# Patient Record
Sex: Female | Born: 1935
Health system: Southern US, Community
[De-identification: ages and names within clinical notes are randomized; demographics above are authoritative.]

## PROBLEM LIST (undated history)

## (undated) DIAGNOSIS — R41841 Cognitive communication deficit: Secondary | ICD-10-CM

## (undated) DIAGNOSIS — C9110 Chronic lymphocytic leukemia of B-cell type not having achieved remission: Secondary | ICD-10-CM

## (undated) DIAGNOSIS — G43909 Migraine, unspecified, not intractable, without status migrainosus: Secondary | ICD-10-CM

## (undated) DIAGNOSIS — C801 Malignant (primary) neoplasm, unspecified: Secondary | ICD-10-CM

## (undated) DIAGNOSIS — E039 Hypothyroidism, unspecified: Secondary | ICD-10-CM

## (undated) DIAGNOSIS — C884 Extranodal marginal zone b-cell lymphoma of mucosa-associated lymphoid tissue (malt-lymphoma) not having achieved remission: Secondary | ICD-10-CM

## (undated) DIAGNOSIS — I609 Nontraumatic subarachnoid hemorrhage, unspecified: Secondary | ICD-10-CM

## (undated) DIAGNOSIS — M792 Neuralgia and neuritis, unspecified: Secondary | ICD-10-CM

## (undated) DIAGNOSIS — M25511 Pain in right shoulder: Secondary | ICD-10-CM

## (undated) DIAGNOSIS — H04129 Dry eye syndrome of unspecified lacrimal gland: Secondary | ICD-10-CM

## (undated) DIAGNOSIS — R000 Tachycardia, unspecified: Secondary | ICD-10-CM

## (undated) DIAGNOSIS — G500 Trigeminal neuralgia: Secondary | ICD-10-CM

## (undated) DIAGNOSIS — M6281 Muscle weakness (generalized): Secondary | ICD-10-CM

## (undated) DIAGNOSIS — M549 Dorsalgia, unspecified: Secondary | ICD-10-CM

## (undated) DIAGNOSIS — E079 Disorder of thyroid, unspecified: Secondary | ICD-10-CM

## (undated) DIAGNOSIS — Z9181 History of falling: Secondary | ICD-10-CM

## (undated) DIAGNOSIS — M810 Age-related osteoporosis without current pathological fracture: Secondary | ICD-10-CM

## (undated) DIAGNOSIS — R2681 Unsteadiness on feet: Secondary | ICD-10-CM

## (undated) DIAGNOSIS — R293 Abnormal posture: Secondary | ICD-10-CM

## (undated) DIAGNOSIS — Z8572 Personal history of non-Hodgkin lymphomas: Principal | ICD-10-CM

## (undated) DIAGNOSIS — I890 Lymphedema, not elsewhere classified: Secondary | ICD-10-CM

## (undated) HISTORY — DX: Lymphedema, not elsewhere classified: I89.0

## (undated) HISTORY — DX: Neuralgia and neuritis, unspecified: M79.2

## (undated) HISTORY — DX: History of falling: Z91.81

## (undated) HISTORY — DX: Malignant (primary) neoplasm, unspecified: C80.1

## (undated) HISTORY — DX: Dry eye syndrome of unspecified lacrimal gland: H04.129

## (undated) HISTORY — DX: Abnormal posture: R29.3

## (undated) HISTORY — PX: TONSILLECTOMY AND ADENOIDECTOMY: SHX28

## (undated) HISTORY — DX: Tachycardia, unspecified: R00.0

## (undated) HISTORY — PX: SHOULDER SURGERY: SHX246

## (undated) HISTORY — DX: Muscle weakness (generalized): M62.81

## (undated) HISTORY — DX: Extranodal marginal zone b-cell lymphoma of mucosa-associated lymphoid tissue (malt-lymphoma) not having achieved remission: C88.40

## (undated) HISTORY — DX: Cognitive communication deficit: R41.841

## (undated) HISTORY — DX: Age-related osteoporosis without current pathological fracture: M81.0

## (undated) HISTORY — DX: Disorder of thyroid, unspecified: E07.9

## (undated) HISTORY — DX: Hypothyroidism, unspecified: E03.9

## (undated) HISTORY — DX: Dorsalgia, unspecified: M54.9

## (undated) HISTORY — PX: WISDOM TOOTH EXTRACTION: SHX21

## (undated) HISTORY — DX: Trigeminal neuralgia: G50.0

## (undated) HISTORY — DX: 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): C88.4

## (undated) HISTORY — DX: Nontraumatic subarachnoid hemorrhage, unspecified: I60.9

## (undated) HISTORY — DX: Personal history of non-Hodgkin lymphomas: Z85.72

## (undated) HISTORY — DX: Pain in right shoulder: M25.511

## (undated) HISTORY — DX: Chronic lymphocytic leukemia of B-cell type not having achieved remission: C91.10

## (undated) HISTORY — DX: Unsteadiness on feet: R26.81

## (undated) HISTORY — DX: Migraine, unspecified, not intractable, without status migrainosus: G43.909

## (undated) HISTORY — PX: OTHER SURGICAL HISTORY: SHX169

---

## 2014-11-18 DIAGNOSIS — C8290 Follicular lymphoma, unspecified, unspecified site: Secondary | ICD-10-CM | POA: Diagnosis not present

## 2014-11-18 DIAGNOSIS — M47892 Other spondylosis, cervical region: Secondary | ICD-10-CM | POA: Diagnosis not present

## 2014-11-18 DIAGNOSIS — N200 Calculus of kidney: Secondary | ICD-10-CM | POA: Diagnosis not present

## 2014-11-18 DIAGNOSIS — K7689 Other specified diseases of liver: Secondary | ICD-10-CM | POA: Diagnosis not present

## 2014-11-18 DIAGNOSIS — C8590 Non-Hodgkin lymphoma, unspecified, unspecified site: Secondary | ICD-10-CM | POA: Diagnosis not present

## 2014-11-18 DIAGNOSIS — K5730 Diverticulosis of large intestine without perforation or abscess without bleeding: Secondary | ICD-10-CM | POA: Diagnosis not present

## 2014-11-19 DIAGNOSIS — C8590 Non-Hodgkin lymphoma, unspecified, unspecified site: Secondary | ICD-10-CM | POA: Diagnosis not present

## 2014-11-22 DIAGNOSIS — E039 Hypothyroidism, unspecified: Secondary | ICD-10-CM | POA: Diagnosis not present

## 2014-11-22 DIAGNOSIS — R1310 Dysphagia, unspecified: Secondary | ICD-10-CM | POA: Diagnosis not present

## 2014-11-22 DIAGNOSIS — Z23 Encounter for immunization: Secondary | ICD-10-CM | POA: Diagnosis not present

## 2014-12-01 DIAGNOSIS — L309 Dermatitis, unspecified: Secondary | ICD-10-CM | POA: Diagnosis not present

## 2014-12-03 DIAGNOSIS — R1310 Dysphagia, unspecified: Secondary | ICD-10-CM | POA: Diagnosis not present

## 2014-12-27 DIAGNOSIS — Z961 Presence of intraocular lens: Secondary | ICD-10-CM | POA: Diagnosis not present

## 2014-12-27 DIAGNOSIS — H538 Other visual disturbances: Secondary | ICD-10-CM | POA: Diagnosis not present

## 2015-01-12 DIAGNOSIS — L299 Pruritus, unspecified: Secondary | ICD-10-CM | POA: Diagnosis not present

## 2015-01-19 DIAGNOSIS — G500 Trigeminal neuralgia: Secondary | ICD-10-CM | POA: Diagnosis not present

## 2015-02-09 DIAGNOSIS — L299 Pruritus, unspecified: Secondary | ICD-10-CM | POA: Diagnosis not present

## 2015-02-16 DIAGNOSIS — M810 Age-related osteoporosis without current pathological fracture: Secondary | ICD-10-CM | POA: Diagnosis not present

## 2015-02-16 DIAGNOSIS — M545 Low back pain: Secondary | ICD-10-CM | POA: Diagnosis not present

## 2015-03-03 DIAGNOSIS — L299 Pruritus, unspecified: Secondary | ICD-10-CM | POA: Diagnosis not present

## 2015-04-06 DIAGNOSIS — L299 Pruritus, unspecified: Secondary | ICD-10-CM | POA: Diagnosis not present

## 2015-05-11 DIAGNOSIS — L299 Pruritus, unspecified: Secondary | ICD-10-CM | POA: Diagnosis not present

## 2015-05-19 DIAGNOSIS — C8590 Non-Hodgkin lymphoma, unspecified, unspecified site: Secondary | ICD-10-CM | POA: Diagnosis not present

## 2015-06-02 DIAGNOSIS — Z23 Encounter for immunization: Secondary | ICD-10-CM | POA: Diagnosis not present

## 2015-06-08 DIAGNOSIS — D485 Neoplasm of uncertain behavior of skin: Secondary | ICD-10-CM | POA: Diagnosis not present

## 2015-06-23 DIAGNOSIS — M810 Age-related osteoporosis without current pathological fracture: Secondary | ICD-10-CM | POA: Diagnosis not present

## 2015-06-23 DIAGNOSIS — Z0000 Encounter for general adult medical examination without abnormal findings: Secondary | ICD-10-CM | POA: Diagnosis not present

## 2015-06-23 DIAGNOSIS — E785 Hyperlipidemia, unspecified: Secondary | ICD-10-CM | POA: Diagnosis not present

## 2015-08-16 ENCOUNTER — Ambulatory Visit: Payer: Self-pay | Admitting: Internal Medicine

## 2015-08-29 DIAGNOSIS — G43909 Migraine, unspecified, not intractable, without status migrainosus: Secondary | ICD-10-CM | POA: Diagnosis not present

## 2015-08-29 DIAGNOSIS — R42 Dizziness and giddiness: Secondary | ICD-10-CM | POA: Diagnosis not present

## 2015-08-29 DIAGNOSIS — M810 Age-related osteoporosis without current pathological fracture: Secondary | ICD-10-CM | POA: Diagnosis not present

## 2015-08-29 DIAGNOSIS — E039 Hypothyroidism, unspecified: Secondary | ICD-10-CM | POA: Diagnosis not present

## 2015-08-29 DIAGNOSIS — G500 Trigeminal neuralgia: Secondary | ICD-10-CM | POA: Diagnosis not present

## 2015-09-20 DIAGNOSIS — Z961 Presence of intraocular lens: Secondary | ICD-10-CM | POA: Diagnosis not present

## 2015-09-20 DIAGNOSIS — H35372 Puckering of macula, left eye: Secondary | ICD-10-CM | POA: Diagnosis not present

## 2015-10-21 ENCOUNTER — Telehealth: Payer: Self-pay | Admitting: *Deleted

## 2015-10-21 NOTE — Telephone Encounter (Signed)
"  I need an appointment to see Dr. Benay Spice.  I have Chronic Leukocytic Leukemia, not having any problems but need to establish care with Dr. Benay Spice.  Dr. Aldona Bar recommended him.  I live at [facility] in [place].  Moved from [place] three months ago.  I was followed by Dr. Daine Gip at the [facility] she call Dr. Sharen Hint for referral, sign release of information and she will be assigned an excellent provider specific to CLL care when referral received.  Dr. Sharen Hint scheduled F/U in February in case I couldn't obtain a provider here but I don't want to go back to [facility] for this appointment."

## 2015-10-31 ENCOUNTER — Telehealth: Payer: Self-pay | Admitting: Hematology and Oncology

## 2015-10-31 NOTE — Telephone Encounter (Signed)
Pt is aware of np appt. 11/02/15@11 :30-per Dr Alvy Bimler

## 2015-11-02 ENCOUNTER — Telehealth: Payer: Self-pay | Admitting: Hematology and Oncology

## 2015-11-02 ENCOUNTER — Other Ambulatory Visit (HOSPITAL_COMMUNITY)
Admission: RE | Admit: 2015-11-02 | Discharge: 2015-11-02 | Disposition: A | Discharge status: Home / Self Care | Payer: Medicare Other | Source: Ambulatory Visit | Attending: Hematology and Oncology | Admitting: Hematology and Oncology

## 2015-11-02 ENCOUNTER — Ambulatory Visit (HOSPITAL_BASED_OUTPATIENT_CLINIC_OR_DEPARTMENT_OTHER): Payer: Medicare Other | Admitting: Hematology and Oncology

## 2015-11-02 ENCOUNTER — Encounter: Payer: Self-pay | Admitting: Hematology and Oncology

## 2015-11-02 ENCOUNTER — Ambulatory Visit (HOSPITAL_BASED_OUTPATIENT_CLINIC_OR_DEPARTMENT_OTHER): Payer: Medicare Other

## 2015-11-02 VITALS — BP 131/41 | HR 85 | Temp 97.6°F | Resp 17 | Wt 118.4 lb

## 2015-11-02 DIAGNOSIS — Z809 Family history of malignant neoplasm, unspecified: Secondary | ICD-10-CM

## 2015-11-02 DIAGNOSIS — Z87891 Personal history of nicotine dependence: Secondary | ICD-10-CM

## 2015-11-02 DIAGNOSIS — D479 Neoplasm of uncertain behavior of lymphoid, hematopoietic and related tissue, unspecified: Secondary | ICD-10-CM | POA: Diagnosis not present

## 2015-11-02 DIAGNOSIS — C8510 Unspecified B-cell lymphoma, unspecified site: Secondary | ICD-10-CM | POA: Insufficient documentation

## 2015-11-02 DIAGNOSIS — Z8572 Personal history of non-Hodgkin lymphomas: Secondary | ICD-10-CM

## 2015-11-02 HISTORY — DX: Personal history of non-Hodgkin lymphomas: Z85.72

## 2015-11-02 LAB — COMPREHENSIVE METABOLIC PANEL
ALT: 13 U/L (ref 0–55)
AST: 20 U/L (ref 5–34)
Albumin: 3.6 g/dL (ref 3.5–5.0)
Alkaline Phosphatase: 43 U/L (ref 40–150)
Anion Gap: 10 mEq/L (ref 3–11)
BUN: 23.4 mg/dL (ref 7.0–26.0)
CO2: 28 mEq/L (ref 22–29)
Calcium: 10 mg/dL (ref 8.4–10.4)
Chloride: 104 mEq/L (ref 98–109)
Creatinine: 0.8 mg/dL (ref 0.6–1.1)
EGFR: 62 mL/min/{1.73_m2} — ABNORMAL LOW (ref >90)
Glucose: 101 mg/dl (ref 70–140)
Potassium: 4.1 mEq/L (ref 3.5–5.1)
Sodium: 142 mEq/L (ref 136–145)
Total Bilirubin: 0.96 mg/dL (ref 0.20–1.20)
Total Protein: 7.9 g/dL (ref 6.4–8.3)

## 2015-11-02 LAB — CBC WITH DIFFERENTIAL/PLATELET
BASO%: 0.5 % (ref 0.0–2.0)
Basophils Absolute: 0 10*3/uL (ref 0.0–0.1)
EOS%: 1.7 % (ref 0.0–7.0)
Eosinophils Absolute: 0.1 10*3/uL (ref 0.0–0.5)
HCT: 36.1 % (ref 34.8–46.6)
HGB: 12.1 g/dL (ref 11.6–15.9)
LYMPH%: 39.8 % (ref 14.0–49.7)
MCH: 33.8 pg (ref 25.1–34.0)
MCHC: 33.7 g/dL (ref 31.5–36.0)
MCV: 100.2 fL (ref 79.5–101.0)
MONO#: 0.5 10*3/uL (ref 0.1–0.9)
MONO%: 7.5 % (ref 0.0–14.0)
NEUT#: 3.2 10*3/uL (ref 1.5–6.5)
NEUT%: 50.5 % (ref 38.4–76.8)
Platelets: 164 10*3/uL (ref 145–400)
RBC: 3.6 10*6/uL — ABNORMAL LOW (ref 3.70–5.45)
RDW: 12.5 % (ref 11.2–14.5)
WBC: 6.4 10*3/uL (ref 3.9–10.3)
lymph#: 2.6 10*3/uL (ref 0.9–3.3)

## 2015-11-02 LAB — LACTATE DEHYDROGENASE: LDH: 243 U/L (ref 125–245)

## 2015-11-02 NOTE — Telephone Encounter (Signed)
Pt confirmed labs/ov per 02/01 POF, gave pt AVS and Calendar.. KJ °

## 2015-11-02 NOTE — Telephone Encounter (Signed)
New patient visit today with Dr. Alvy Bimler.

## 2015-11-04 LAB — FLOW CYTOMETRY

## 2015-11-04 NOTE — Progress Notes (Signed)
[place] CONSULT NOTE  Patient Care Team: Kathyrn Lass, MD as PCP - General (Family Medicine)  CHIEF COMPLAINTS/PURPOSE OF CONSULTATION:   history of lymphoma  HISTORY OF PRESENTING ILLNESS:  Joy Lynch 82 y.o. female is here because of diagnosis of lymphoma. I will review her outside records amounting to almost 100 pages and collaborated the history with the patient. The patient initially presented in another facility with extreme leukocytosis. CBC from 12/11/2011 show white count of 107.8, hemoglobin 11.1 and platelet count of 145,000. 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 She received treatment with Rituximab weekly x 4 with good response to treatment.  From January 2014 to June 2014, she received single agent Bendamustine. Her last imaging study from 11/18/2014 with CT scan of the chest, abdomen and pelvis show complete resolution of disease. Her oncologist was Dr. Sharen Hint from [facility]. She subsequently moved to [place] to reside in an assisted living facility. She feels well. Denies lymphadenopathy. No recent infection.  Denies recent anorexia or abnormal weight loss  MEDICAL HISTORY:  Past Medical History  Diagnosis Date  . Cancer [facility])     non hodgkin lymphoma  . Thyroid disease   . Osteoporosis   . Trigeminal neuralgia of left side of face   . Migraines   . History of B-cell lymphoma 11/02/2015    SURGICAL HISTORY: Past Surgical History  Procedure Laterality Date  . Gamma knife      for trigeminal neuralgia    SOCIAL HISTORY: Social History   Social History  . Marital Status: Divorced    Spouse Name: N/A  . Number of Children: N/A  . Years of Education: N/A   Occupational History  . Not on file.   Social History Main Topics  . Smoking status: Former Smoker -- 0.50 packs/day for 20 years    Quit date: 10/02/1975  . Smokeless tobacco: Never Used  . Alcohol  Use: 0.6 oz/week    1 Glasses of wine per week  . Drug Use: No  . Sexual Activity: No     Comment: divorced, no children, sister in law next of kin. 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 Not on file   Social History Narrative  . No narrative on file    FAMILY HISTORY: Family History  Problem Relation Age of Onset  . Cancer Father     unknown ca  . Cancer Brother     prostate ca    ALLERGIES:  is allergic to penicillins; tegretol; and lyrica.  MEDICATIONS:  Current Outpatient Prescriptions  Medication Sig Dispense Refill  . alendronate (FOSAMAX) 70 MG tablet Take 70 mg by mouth once a week.  2  . aspirin 81 MG tablet Take 81 mg by mouth daily.    . calcium-vitamin D (OSCAL WITH D) 500-200 MG-UNIT tablet Take 1 tablet by mouth daily with breakfast.    . gabapentin (NEURONTIN) 100 MG capsule Take 100 mg by mouth 3 (three) times daily.  2  . levothyroxine (SYNTHROID, LEVOTHROID) 75 MCG tablet Take 75 mcg by mouth daily. Six days per week  1  . Omega-3 Fatty Acids (FISH OIL PO) Take by mouth 2 (two) times daily.    . propranolol (INDERAL) 10 MG tablet Take 10 mg by mouth daily.     No current facility-administered medications for this visit.    REVIEW OF SYSTEMS:   Constitutional: Denies fevers, chills or abnormal night  sweats Eyes: Denies blurriness of vision, double vision or watery eyes Ears, nose, mouth, throat, and face: Denies mucositis or sore throat Respiratory: Denies cough, dyspnea or wheezes Cardiovascular: Denies palpitation, chest discomfort or lower extremity swelling Gastrointestinal:  Denies nausea, heartburn or change in bowel habits Skin: Denies abnormal skin rashes Lymphatics: Denies new lymphadenopathy or easy bruising Neurological:Denies numbness, tingling or new weaknesses Behavioral/Psych: Mood is stable, no new changes  All other systems were reviewed with the patient and are negative.  PHYSICAL EXAMINATION: ECOG  PERFORMANCE STATUS: 0 - Asymptomatic  Filed Vitals:   11/02/15 1133  BP: 131/41  Pulse: 85  Temp: 97.6 F (36.4 C)  Resp: 17   Filed Weights   11/02/15 1133  Weight: 118 lb 6.4 oz (53.706 kg)    GENERAL:alert, no distress and comfortable SKIN: skin color, texture, turgor are normal, no rashes or significant lesions EYES: normal, conjunctiva are pink and non-injected, sclera clear OROPHARYNX:no exudate, no erythema and lips, buccal mucosa, and tongue normal  NECK: supple, thyroid normal size, non-tender, without nodularity LYMPH:  no palpable lymphadenopathy in the cervical, axillary or inguinal LUNGS: clear to auscultation and percussion with normal breathing effort HEART: regular rate & rhythm and no murmurs and no lower extremity edema ABDOMEN:abdomen soft, non-tender and normal bowel sounds Musculoskeletal:no cyanosis of digits and no clubbing  PSYCH: alert & oriented x 3 with fluent speech NEURO: no focal motor/sensory deficits  LABORATORY DATA:  I have reviewed the data as listed Lab Results  Component Value Date   WBC 6.4 11/02/2015   HGB 12.1 11/02/2015   HCT 36.1 11/02/2015   MCV 100.2 11/02/2015   PLT 164 11/02/2015    Recent Labs  11/02/15 1235  NA 142  K 4.1  CO2 28  GLUCOSE 101  BUN 23.4  CREATININE 0.8  CALCIUM 10.0  PROT 7.9  ALBUMIN 3.6  AST 20  ALT 13  ALKPHOS 43  BILITOT 0.96   ASSESSMENT & PLAN:  History of B-cell lymphoma  Clinically, she has no signs of disease. CBC is adequate. Flow cytometry picked up lymphoproliferative disorder and based on her outside records, she either have marginal zone lymphoma versus lymphoplasmacytic lymphoma. There is no role forward team surveillance imaging. I will see her back at the end of the year with a repeat history, physical examination and blood work. There is no role for treatment for now.     All questions were answered. The patient knows to call the clinic with any problems, questions or  concerns. I spent 40 minutes counseling the patient face to face. The total time spent in the appointment was 55 minutes and more than 50% was on counseling.     [place], Wright, MD 11/04/2015 11:10 AM

## 2015-11-04 NOTE — Assessment & Plan Note (Signed)
Clinically, she has no signs of disease. CBC is adequate. Flow cytometry picked up lymphoproliferative disorder and based on her outside records, she either have marginal zone lymphoma versus lymphoplasmacytic lymphoma. There is no role forward team surveillance imaging. I will see her back at the end of the year with a repeat history, physical examination and blood work. There is no role for treatment for now.

## 2015-11-28 DIAGNOSIS — D1801 Hemangioma of skin and subcutaneous tissue: Secondary | ICD-10-CM | POA: Diagnosis not present

## 2015-11-28 DIAGNOSIS — L720 Epidermal cyst: Secondary | ICD-10-CM | POA: Diagnosis not present

## 2015-11-28 DIAGNOSIS — D692 Other nonthrombocytopenic purpura: Secondary | ICD-10-CM | POA: Diagnosis not present

## 2015-11-28 DIAGNOSIS — L821 Other seborrheic keratosis: Secondary | ICD-10-CM | POA: Diagnosis not present

## 2015-12-16 DIAGNOSIS — J309 Allergic rhinitis, unspecified: Secondary | ICD-10-CM | POA: Diagnosis not present

## 2015-12-16 DIAGNOSIS — J029 Acute pharyngitis, unspecified: Secondary | ICD-10-CM | POA: Diagnosis not present

## 2016-02-06 DIAGNOSIS — C9110 Chronic lymphocytic leukemia of B-cell type not having achieved remission: Secondary | ICD-10-CM | POA: Diagnosis not present

## 2016-02-06 DIAGNOSIS — G43909 Migraine, unspecified, not intractable, without status migrainosus: Secondary | ICD-10-CM | POA: Diagnosis not present

## 2016-02-06 DIAGNOSIS — R42 Dizziness and giddiness: Secondary | ICD-10-CM | POA: Diagnosis not present

## 2016-02-06 DIAGNOSIS — E039 Hypothyroidism, unspecified: Secondary | ICD-10-CM | POA: Diagnosis not present

## 2016-02-06 DIAGNOSIS — M810 Age-related osteoporosis without current pathological fracture: Secondary | ICD-10-CM | POA: Diagnosis not present

## 2016-02-06 DIAGNOSIS — G500 Trigeminal neuralgia: Secondary | ICD-10-CM | POA: Diagnosis not present

## 2016-06-26 ENCOUNTER — Ambulatory Visit (HOSPITAL_BASED_OUTPATIENT_CLINIC_OR_DEPARTMENT_OTHER): Payer: Medicare Other | Admitting: Hematology and Oncology

## 2016-06-26 ENCOUNTER — Telehealth: Payer: Self-pay | Admitting: Hematology and Oncology

## 2016-06-26 ENCOUNTER — Encounter: Payer: Self-pay | Admitting: Hematology and Oncology

## 2016-06-26 ENCOUNTER — Other Ambulatory Visit (HOSPITAL_BASED_OUTPATIENT_CLINIC_OR_DEPARTMENT_OTHER): Payer: Medicare Other

## 2016-06-26 VITALS — BP 120/45 | HR 80 | Temp 97.5°F | Resp 18 | Ht 62.0 in | Wt 122.7 lb

## 2016-06-26 DIAGNOSIS — R778 Other specified abnormalities of plasma proteins: Secondary | ICD-10-CM | POA: Insufficient documentation

## 2016-06-26 DIAGNOSIS — D696 Thrombocytopenia, unspecified: Secondary | ICD-10-CM | POA: Diagnosis not present

## 2016-06-26 DIAGNOSIS — R740 Nonspecific elevation of levels of transaminase and lactic acid dehydrogenase [LDH]: Secondary | ICD-10-CM | POA: Diagnosis not present

## 2016-06-26 DIAGNOSIS — Z8572 Personal history of non-Hodgkin lymphomas: Secondary | ICD-10-CM

## 2016-06-26 LAB — COMPREHENSIVE METABOLIC PANEL
ALT: 15 U/L (ref 0–55)
AST: 24 U/L (ref 5–34)
Albumin: 3.2 g/dL — ABNORMAL LOW (ref 3.5–5.0)
Alkaline Phosphatase: 51 U/L (ref 40–150)
Anion Gap: 11 mEq/L (ref 3–11)
BUN: 20.3 mg/dL (ref 7.0–26.0)
CO2: 25 mEq/L (ref 22–29)
Calcium: 9.6 mg/dL (ref 8.4–10.4)
Chloride: 104 mEq/L (ref 98–109)
Creatinine: 0.9 mg/dL (ref 0.6–1.1)
EGFR: 61 mL/min/{1.73_m2} — ABNORMAL LOW (ref >90)
Glucose: 101 mg/dl (ref 70–140)
Potassium: 4.3 mEq/L (ref 3.5–5.1)
Sodium: 140 mEq/L (ref 136–145)
Total Bilirubin: 0.61 mg/dL (ref 0.20–1.20)
Total Protein: 9.2 g/dL — ABNORMAL HIGH (ref 6.4–8.3)

## 2016-06-26 LAB — CBC WITH DIFFERENTIAL/PLATELET
BASO%: 0.2 % (ref 0.0–2.0)
Basophils Absolute: 0 10*3/uL (ref 0.0–0.1)
EOS%: 2.1 % (ref 0.0–7.0)
Eosinophils Absolute: 0.2 10*3/uL (ref 0.0–0.5)
HCT: 35.6 % (ref 34.8–46.6)
HGB: 11.9 g/dL (ref 11.6–15.9)
LYMPH%: 46.1 % (ref 14.0–49.7)
MCH: 33.4 pg (ref 25.1–34.0)
MCHC: 33.4 g/dL (ref 31.5–36.0)
MCV: 100 fL (ref 79.5–101.0)
MONO#: 1.2 10*3/uL — ABNORMAL HIGH (ref 0.1–0.9)
MONO%: 15.3 % — ABNORMAL HIGH (ref 0.0–14.0)
NEUT#: 2.9 10*3/uL (ref 1.5–6.5)
NEUT%: 36.3 % — ABNORMAL LOW (ref 38.4–76.8)
Platelets: 142 10*3/uL — ABNORMAL LOW (ref 145–400)
RBC: 3.56 10*6/uL — ABNORMAL LOW (ref 3.70–5.45)
RDW: 13.3 % (ref 11.2–14.5)
WBC: 8.1 10*3/uL (ref 3.9–10.3)
lymph#: 3.7 10*3/uL — ABNORMAL HIGH (ref 0.9–3.3)
nRBC: 0 % (ref 0–0)

## 2016-06-26 LAB — LACTATE DEHYDROGENASE: LDH: 306 U/L — ABNORMAL HIGH (ref 125–245)

## 2016-06-26 LAB — TECHNOLOGIST REVIEW

## 2016-06-26 NOTE — Progress Notes (Signed)
[place] OFFICE PROGRESS NOTE  Patient Care Team: Kathyrn Lass, MD as PCP - General (Family Medicine)  SUMMARY OF ONCOLOGIC HISTORY:  Joy Lynch 82 y.o. female is here because of diagnosis of lymphoma. I will review her outside records amounting to almost 100 pages and collaborated the history with the patient. The patient initially presented in another facility with extreme leukocytosis. CBC from 12/11/2011 show white count of 107.8, hemoglobin 11.1 and platelet count of 145,000. 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 She received treatment with Rituximab weekly x 4 with good response to treatment.  From January 2014 to June 2014, she received single agent Bendamustine. Her last imaging study from 11/18/2014 with CT scan of the chest, abdomen and pelvis show complete resolution of disease. Her oncologist was Dr. Sharen Hint from [facility]. She subsequently moved to [place] to reside in an assisted living facility.  INTERVAL HISTORY: Please see below for problem oriented charting. She feels well. Denies lymphadenopathy. No recent infection.  Denies recent anorexia or abnormal weight loss  REVIEW OF SYSTEMS:   Constitutional: Denies fevers, chills or abnormal weight loss Eyes: Denies blurriness of vision Ears, nose, mouth, throat, and face: Denies mucositis or sore throat Respiratory: Denies cough, dyspnea or wheezes Cardiovascular: Denies palpitation, chest discomfort or lower extremity swelling Gastrointestinal:  Denies nausea, heartburn or change in bowel habits Skin: Denies abnormal skin rashes Lymphatics: Denies new lymphadenopathy or easy bruising Neurological:Denies numbness, tingling or new weaknesses Behavioral/Psych: Mood is stable, no new changes  All other systems were reviewed with the patient and are negative.  I have reviewed the past medical history, past surgical history,  social history and family history with the patient and they are unchanged from previous note.  ALLERGIES:  is allergic to penicillins; tegretol [carbamazepine]; and lyrica [pregabalin].  MEDICATIONS:  Current Outpatient Prescriptions  Medication Sig Dispense Refill  . alendronate (FOSAMAX) 70 MG tablet Take 70 mg by mouth once a week.  2  . aspirin 81 MG tablet Take 81 mg by mouth daily.    . calcium-vitamin D (OSCAL WITH D) 500-200 MG-UNIT tablet Take 1 tablet by mouth daily with breakfast.    . gabapentin (NEURONTIN) 100 MG capsule Take 100 mg by mouth 3 (three) times daily.  2  . levothyroxine (SYNTHROID, LEVOTHROID) 75 MCG tablet Take 75 mcg by mouth daily. Six days per week  1  . Omega-3 Fatty Acids (FISH OIL PO) Take by mouth 2 (two) times daily.    . propranolol (INDERAL) 10 MG tablet Take 10 mg by mouth daily.     No current facility-administered medications for this visit.     PHYSICAL EXAMINATION: ECOG PERFORMANCE STATUS: 1 - Symptomatic but completely ambulatory  Vitals:   06/26/16 1202  BP: (!) 120/45  Pulse: 80  Resp: 18  Temp: 97.5 F (36.4 C)   Filed Weights   06/26/16 1202  Weight: 122 lb 11.2 oz (55.7 kg)    GENERAL:alert, no distress and comfortable SKIN: skin color, texture, turgor are normal, no rashes or significant lesions EYES: normal, Conjunctiva are pink and non-injected, sclera clear OROPHARYNX:no exudate, no erythema and lips, buccal mucosa, and tongue normal  NECK: supple, thyroid normal size, non-tender, without nodularity LYMPH:  no palpable lymphadenopathy in the cervical, axillary or inguinal LUNGS: clear to auscultation and percussion with normal breathing effort HEART: regular rate & rhythm and no murmurs and no lower extremity edema ABDOMEN:abdomen soft, non-tender and normal bowel  sounds Musculoskeletal:no cyanosis of digits and no clubbing  NEURO: alert & oriented x 3 with fluent speech, no focal motor/sensory deficits  LABORATORY  DATA:  I have reviewed the data as listed    Component Value Date/Time   NA 140 06/26/2016 1144   K 4.3 06/26/2016 1144   CO2 25 06/26/2016 1144   GLUCOSE 101 06/26/2016 1144   BUN 20.3 06/26/2016 1144   CREATININE 0.9 06/26/2016 1144   CALCIUM 9.6 06/26/2016 1144   PROT 9.2 (H) 06/26/2016 1144   ALBUMIN 3.2 (L) 06/26/2016 1144   AST 24 06/26/2016 1144   ALT 15 06/26/2016 1144   ALKPHOS 51 06/26/2016 1144   BILITOT 0.61 06/26/2016 1144    No results found for: SPEP, UPEP  Lab Results  Component Value Date   WBC 8.1 06/26/2016   NEUTROABS 2.9 06/26/2016   HGB 11.9 06/26/2016   HCT 35.6 06/26/2016   MCV 100.0 06/26/2016   PLT 142 (L) 06/26/2016      Chemistry      Component Value Date/Time   NA 140 06/26/2016 1144   K 4.3 06/26/2016 1144   CO2 25 06/26/2016 1144   BUN 20.3 06/26/2016 1144   CREATININE 0.9 06/26/2016 1144      Component Value Date/Time   CALCIUM 9.6 06/26/2016 1144   ALKPHOS 51 06/26/2016 1144   AST 24 06/26/2016 1144   ALT 15 06/26/2016 1144   BILITOT 0.61 06/26/2016 1144      ASSESSMENT & PLAN:  History of B-cell lymphoma Her blood work today show mild thrombocytopenia, elevated total protein and mildly elevated LDH. I suspect she may have early signs of disease relapse. However, the patient is completely asymptomatic. I recommend her to return in 6 months again with repeat history, physical examination and blood work. If she shows signs of worsening thrombocytopenia, we might consider restarting her back on treatment then. I reinforced the importance of influenza vaccination. The patient declined vaccination today in my office but she will get it at her current assisted living facility.   Thrombocytopenia ([place]) The cause is likely due to early disease relapse. It is mild. The patient denies recent history of bleeding such as epistaxis, hematuria or hematochezia. She is asymptomatic from the thrombocytopenia. I will observe for now.    Elevated total protein This is related to early signs of relapse. I will order immunoglobulin levels in her next visit   Orders Placed This Encounter  Procedures  . CBC with Differential/Platelet    Standing Status:   Future    Standing Expiration Date:   07/31/2017  . Comprehensive metabolic panel    Standing Status:   Future    Standing Expiration Date:   07/31/2017  . Lactate dehydrogenase    Standing Status:   Future    Standing Expiration Date:   07/31/2017  . IgG, IgA, IgM    Standing Status:   Future    Standing Expiration Date:   07/31/2017   All questions were answered. The patient knows to call the clinic with any problems, questions or concerns. No barriers to learning was detected. I spent 15 minutes counseling the patient face to face. The total time spent in the appointment was 20 minutes and more than 50% was on counseling and review of test results     Heath Lark, MD 06/26/2016 1:03 PM

## 2016-06-26 NOTE — Assessment & Plan Note (Signed)
Her blood work today show mild thrombocytopenia, elevated total protein and mildly elevated LDH. I suspect she may have early signs of disease relapse. However, the patient is completely asymptomatic. I recommend her to return in 6 months again with repeat history, physical examination and blood work. If she shows signs of worsening thrombocytopenia, we might consider restarting her back on treatment then. I reinforced the importance of influenza vaccination. The patient declined vaccination today in my office but she will get it at her current assisted living facility.

## 2016-06-26 NOTE — Telephone Encounter (Signed)
Gave patient avs report and appointments for March  °

## 2016-06-26 NOTE — Assessment & Plan Note (Signed)
This is related to early signs of relapse. I will order immunoglobulin levels in her next visit

## 2016-06-26 NOTE — Assessment & Plan Note (Signed)
The cause is likely due to early disease relapse. It is mild. The patient denies recent history of bleeding such as epistaxis, hematuria or hematochezia. She is asymptomatic from the thrombocytopenia. I will observe for now.

## 2016-07-06 DIAGNOSIS — E039 Hypothyroidism, unspecified: Secondary | ICD-10-CM | POA: Diagnosis not present

## 2016-07-06 DIAGNOSIS — R2681 Unsteadiness on feet: Secondary | ICD-10-CM | POA: Diagnosis not present

## 2016-07-11 DIAGNOSIS — R2681 Unsteadiness on feet: Secondary | ICD-10-CM | POA: Diagnosis not present

## 2016-07-12 DIAGNOSIS — Z23 Encounter for immunization: Secondary | ICD-10-CM | POA: Diagnosis not present

## 2016-08-10 DIAGNOSIS — G43909 Migraine, unspecified, not intractable, without status migrainosus: Secondary | ICD-10-CM | POA: Diagnosis not present

## 2016-08-10 DIAGNOSIS — R0982 Postnasal drip: Secondary | ICD-10-CM | POA: Diagnosis not present

## 2016-08-10 DIAGNOSIS — E039 Hypothyroidism, unspecified: Secondary | ICD-10-CM | POA: Diagnosis not present

## 2016-08-10 DIAGNOSIS — R350 Frequency of micturition: Secondary | ICD-10-CM | POA: Diagnosis not present

## 2016-08-10 DIAGNOSIS — M810 Age-related osteoporosis without current pathological fracture: Secondary | ICD-10-CM | POA: Diagnosis not present

## 2016-08-10 DIAGNOSIS — C9110 Chronic lymphocytic leukemia of B-cell type not having achieved remission: Secondary | ICD-10-CM | POA: Diagnosis not present

## 2016-08-10 DIAGNOSIS — Z0000 Encounter for general adult medical examination without abnormal findings: Secondary | ICD-10-CM | POA: Diagnosis not present

## 2016-08-10 DIAGNOSIS — Z1231 Encounter for screening mammogram for malignant neoplasm of breast: Secondary | ICD-10-CM | POA: Diagnosis not present

## 2016-08-10 DIAGNOSIS — G500 Trigeminal neuralgia: Secondary | ICD-10-CM | POA: Diagnosis not present

## 2016-08-13 ENCOUNTER — Ambulatory Visit (INDEPENDENT_AMBULATORY_CARE_PROVIDER_SITE_OTHER): Payer: Medicare Other | Admitting: Neurology

## 2016-08-13 ENCOUNTER — Encounter: Payer: Self-pay | Admitting: Neurology

## 2016-08-13 DIAGNOSIS — G500 Trigeminal neuralgia: Secondary | ICD-10-CM | POA: Diagnosis not present

## 2016-08-13 MED ORDER — GABAPENTIN 100 MG PO CAPS: 300.0000 mg | ORAL_CAPSULE | Freq: Three times a day (TID) | ORAL | 11 refills | Status: DC

## 2016-08-13 NOTE — Progress Notes (Signed)
PATIENT: Joy Lynch DOB: [birth year]  Chief Complaint  Patient presents with  . Trigeminal Neuralgia    She is here for her left-sided facial pain.  She is currenlty taking gabapentin 100mg , TID.  It has been helpful the past but it is now only mildly controlling her symptoms.  She has had gamma knife radiosurgery in the past with temporary relief. She would like to discuss other treatment options.  Marland Kitchen PCP    Michel Harrow, PA-C     HISTORICAL  Joy Lynch is a 82 year old right-handed female, seen in refer by her primary care PA Michel Harrow for evaluation of left-sided facial pain, initial evaluation was on August 13 2016.  She had a history of left trigeminal neuralgia since 1998, she presented with intermittent left facial pain, involving left lower eyelid, left upper jaw, for few years, she had frequent flareup, failed to medication treatment, eventually had gamma knife treatment in 2011, which helped her symptoms. She has been pain-free for 2 years, around 2013, she began to have intermittent recurrent left facial pain, but only mild, far in between, lasting 7-10 days,  Since October 2017, she began to experience increased facial pain, despite taking gabapentin 100/100/200 mg, she still complains of frequent left facial pain, present 50% of the daytime, it very annoying for her,  She also had a history of chronic lymphocytic anemia, was treated by oncologist at [facility], she was treated with chemotherapy twice.  She lives in independent living, denies gait abnormality, drive to clinic today.  REVIEW OF SYSTEMS: Full 14 system review of systems performed and notable only for memory loss, restless leg, runny nose, joint pain  ALLERGIES: Allergies  Allergen Reactions  . Penicillins Hives  . Tegretol [Carbamazepine] Other (See Comments)    Turned purple from neck down and drowsy  . Lyrica [Pregabalin] Other (See Comments)    Drowsy    HOME  MEDICATIONS: Current Outpatient Prescriptions  Medication Sig Dispense Refill  . alendronate (FOSAMAX) 70 MG tablet Take 70 mg by mouth once a week.  2  . aspirin 81 MG tablet Take 81 mg by mouth daily.    . calcium-vitamin D (OSCAL WITH D) 500-200 MG-UNIT tablet Take 1 tablet by mouth daily with breakfast.    . gabapentin (NEURONTIN) 100 MG capsule Take 100 mg by mouth 3 (three) times daily.  2  . levothyroxine (SYNTHROID, LEVOTHROID) 75 MCG tablet Take 75 mcg by mouth daily. Six days per week  1  . Omega-3 Fatty Acids (FISH OIL PO) Take by mouth 2 (two) times daily.    . propranolol (INDERAL) 10 MG tablet Take 10 mg by mouth daily.     No current facility-administered medications for this visit.     PAST MEDICAL HISTORY: Past Medical History:  Diagnosis Date  . Cancer [facility])    non hodgkin lymphoma  . Chronic lymphocytic leukemia ([place])   . History of B-cell lymphoma 11/02/2015  . Migraines   . Osteoporosis   . Thyroid disease   . Trigeminal neuralgia of left side of face     PAST SURGICAL HISTORY: Past Surgical History:  Procedure Laterality Date  . gamma knife     for trigeminal neuralgia  . SHOULDER SURGERY     Right shoulder replacement  . TONSILLECTOMY AND ADENOIDECTOMY    . WISDOM TOOTH EXTRACTION      FAMILY HISTORY: Family History  Problem Relation Age of Onset  . Cancer Father  unknown ca  . Pneumonia Father   . Cancer Brother     prostate ca  . Stroke Brother   . Heart attack Mother     SOCIAL HISTORY:  Social History   Social History  . Marital status: Divorced    Spouse name: N/A  . Number of children: 0  . Years of education: Masters   Occupational History  . Retired    Social History Main Topics  . Smoking status: Former Smoker    Packs/day: 0.50    Years: 20.00    Quit date: 10/02/1975  . Smokeless tobacco: Never Used  . Alcohol use 1.2 oz/week    2 Glasses of wine per week  . Drug use: No  . Sexual activity: No     Comment:  divorced, no children, sister in law next of kin. 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 Not on file   Social History Narrative   Lives at home alone.   Right-handed.   4 cups caffeine per day.     PHYSICAL EXAM   Vitals:   08/13/16 1411  BP: 114/63  Pulse: 87  Weight: 122 lb (55.3 kg)  Height: 5\' 2"  (1.575 m)    Not recorded      Body mass index is 22.31 kg/m.  PHYSICAL EXAMNIATION:  Gen: NAD, conversant, well nourised, obese, well groomed                     Cardiovascular: Regular rate rhythm, no peripheral edema, warm, nontender. Eyes: Conjunctivae clear without exudates or hemorrhage Neck: Supple, no carotid bruits. Pulmonary: Clear to auscultation bilaterally   NEUROLOGICAL EXAM:  MENTAL STATUS: Speech:    Speech is normal; fluent and spontaneous with normal comprehension.  Cognition:     Orientation to time, place and person     Normal recent and remote memory     Normal Attention span and concentration     Normal Language, naming, repeating,spontaneous speech     Fund of knowledge   CRANIAL NERVES: CN II: Visual fields are full to confrontation. Fundoscopic exam is normal with sharp discs and no vascular changes. Pupils are round equal and briskly reactive to light. CN III, IV, VI: extraocular movement are normal. No ptosis. CN V: Facial sensation is intact to pinprick in all 3 divisions bilaterally. Corneal responses are intact.  CN VII: Face is symmetric with normal eye closure and smile. CN VIII: Hearing is normal to rubbing fingers CN IX, X: Palate elevates symmetrically. Phonation is normal. CN XI: Head turning and shoulder shrug are intact CN XII: Tongue is midline with normal movements and no atrophy.  MOTOR: There is no pronator drift of out-stretched arms. Muscle bulk and tone are normal. Muscle strength is normal.  REFLEXES: Reflexes are 2+ and symmetric at the biceps, triceps, knees, and ankles. Plantar responses  are flexor.  SENSORY: Intact to light touch, pinprick, positional sensation and vibratory sensation are intact in fingers and toes.  COORDINATION: Rapid alternating movements and fine finger movements are intact. There is no dysmetria on finger-to-nose and heel-knee-shin.    GAIT/STANCE: Posture is normal. Gait is steady with normal steps, base, arm swing, and turning. Heel and toe walking are normal. Tandem gait is normal.  Romberg is absent.   DIAGNOSTIC DATA (LABS, IMAGING, TESTING) - I reviewed patient records, labs, notes, testing and imaging myself where available.   ASSESSMENT AND PLAN  Joy Lynch is a 82 y.o.  female   Left trigeminal neuralgia  Increase gabapentin to 100 mg up to 3 tablets 3 times a day  Return to clinic in 2 months   Marcial Pacas, M.D. Ph.D.  [facility] [address] [place], [place] [address] Ph: [phone] Fax: [phone]  CC: Michel Harrow, PA-C

## 2016-08-14 ENCOUNTER — Other Ambulatory Visit: Payer: Self-pay | Admitting: Physician Assistant

## 2016-08-14 DIAGNOSIS — M810 Age-related osteoporosis without current pathological fracture: Secondary | ICD-10-CM

## 2016-08-30 DIAGNOSIS — M25511 Pain in right shoulder: Secondary | ICD-10-CM | POA: Diagnosis not present

## 2016-09-03 ENCOUNTER — Ambulatory Visit
Admission: RE | Admit: 2016-09-03 | Discharge: 2016-09-03 | Disposition: A | Discharge status: Home / Self Care | Payer: Medicare Other | Source: Ambulatory Visit | Attending: Family Medicine | Admitting: Family Medicine

## 2016-09-03 ENCOUNTER — Other Ambulatory Visit: Payer: Self-pay | Admitting: Family Medicine

## 2016-09-03 DIAGNOSIS — M25511 Pain in right shoulder: Secondary | ICD-10-CM

## 2016-09-03 DIAGNOSIS — Z471 Aftercare following joint replacement surgery: Secondary | ICD-10-CM | POA: Diagnosis not present

## 2016-09-03 DIAGNOSIS — Z96611 Presence of right artificial shoulder joint: Secondary | ICD-10-CM | POA: Diagnosis not present

## 2016-09-26 DIAGNOSIS — Z961 Presence of intraocular lens: Secondary | ICD-10-CM | POA: Diagnosis not present

## 2016-09-26 DIAGNOSIS — H04123 Dry eye syndrome of bilateral lacrimal glands: Secondary | ICD-10-CM | POA: Diagnosis not present

## 2016-10-03 DIAGNOSIS — R293 Abnormal posture: Secondary | ICD-10-CM | POA: Diagnosis not present

## 2016-10-03 DIAGNOSIS — M6281 Muscle weakness (generalized): Secondary | ICD-10-CM | POA: Diagnosis not present

## 2016-10-03 DIAGNOSIS — M25511 Pain in right shoulder: Secondary | ICD-10-CM | POA: Diagnosis not present

## 2016-10-04 ENCOUNTER — Ambulatory Visit: Payer: Medicare Other | Admitting: Neurology

## 2016-10-05 DIAGNOSIS — R293 Abnormal posture: Secondary | ICD-10-CM | POA: Diagnosis not present

## 2016-10-05 DIAGNOSIS — M6281 Muscle weakness (generalized): Secondary | ICD-10-CM | POA: Diagnosis not present

## 2016-10-05 DIAGNOSIS — M25511 Pain in right shoulder: Secondary | ICD-10-CM | POA: Diagnosis not present

## 2016-10-08 DIAGNOSIS — M25511 Pain in right shoulder: Secondary | ICD-10-CM | POA: Diagnosis not present

## 2016-10-08 DIAGNOSIS — R293 Abnormal posture: Secondary | ICD-10-CM | POA: Diagnosis not present

## 2016-10-08 DIAGNOSIS — M6281 Muscle weakness (generalized): Secondary | ICD-10-CM | POA: Diagnosis not present

## 2016-10-10 DIAGNOSIS — R293 Abnormal posture: Secondary | ICD-10-CM | POA: Diagnosis not present

## 2016-10-10 DIAGNOSIS — M25511 Pain in right shoulder: Secondary | ICD-10-CM | POA: Diagnosis not present

## 2016-10-10 DIAGNOSIS — M6281 Muscle weakness (generalized): Secondary | ICD-10-CM | POA: Diagnosis not present

## 2016-10-15 DIAGNOSIS — M6281 Muscle weakness (generalized): Secondary | ICD-10-CM | POA: Diagnosis not present

## 2016-10-15 DIAGNOSIS — M25511 Pain in right shoulder: Secondary | ICD-10-CM | POA: Diagnosis not present

## 2016-10-15 DIAGNOSIS — R293 Abnormal posture: Secondary | ICD-10-CM | POA: Diagnosis not present

## 2016-10-22 DIAGNOSIS — R293 Abnormal posture: Secondary | ICD-10-CM | POA: Diagnosis not present

## 2016-10-22 DIAGNOSIS — M25511 Pain in right shoulder: Secondary | ICD-10-CM | POA: Diagnosis not present

## 2016-10-22 DIAGNOSIS — M6281 Muscle weakness (generalized): Secondary | ICD-10-CM | POA: Diagnosis not present

## 2016-10-24 DIAGNOSIS — M25511 Pain in right shoulder: Secondary | ICD-10-CM | POA: Diagnosis not present

## 2016-10-24 DIAGNOSIS — M6281 Muscle weakness (generalized): Secondary | ICD-10-CM | POA: Diagnosis not present

## 2016-10-24 DIAGNOSIS — R293 Abnormal posture: Secondary | ICD-10-CM | POA: Diagnosis not present

## 2016-10-29 DIAGNOSIS — M6281 Muscle weakness (generalized): Secondary | ICD-10-CM | POA: Diagnosis not present

## 2016-10-29 DIAGNOSIS — M25511 Pain in right shoulder: Secondary | ICD-10-CM | POA: Diagnosis not present

## 2016-10-29 DIAGNOSIS — R293 Abnormal posture: Secondary | ICD-10-CM | POA: Diagnosis not present

## 2016-10-31 DIAGNOSIS — M25511 Pain in right shoulder: Secondary | ICD-10-CM | POA: Diagnosis not present

## 2016-10-31 DIAGNOSIS — M6281 Muscle weakness (generalized): Secondary | ICD-10-CM | POA: Diagnosis not present

## 2016-10-31 DIAGNOSIS — R293 Abnormal posture: Secondary | ICD-10-CM | POA: Diagnosis not present

## 2016-11-01 DIAGNOSIS — G500 Trigeminal neuralgia: Secondary | ICD-10-CM | POA: Diagnosis not present

## 2016-11-01 DIAGNOSIS — Z79899 Other long term (current) drug therapy: Secondary | ICD-10-CM | POA: Diagnosis not present

## 2016-11-01 DIAGNOSIS — Z888 Allergy status to other drugs, medicaments and biological substances status: Secondary | ICD-10-CM | POA: Diagnosis not present

## 2016-11-01 DIAGNOSIS — Z880 Allergy status to penicillin: Secondary | ICD-10-CM | POA: Diagnosis not present

## 2016-11-13 DIAGNOSIS — M6281 Muscle weakness (generalized): Secondary | ICD-10-CM | POA: Diagnosis not present

## 2016-11-13 DIAGNOSIS — R293 Abnormal posture: Secondary | ICD-10-CM | POA: Diagnosis not present

## 2016-11-13 DIAGNOSIS — M25511 Pain in right shoulder: Secondary | ICD-10-CM | POA: Diagnosis not present

## 2016-11-15 DIAGNOSIS — R293 Abnormal posture: Secondary | ICD-10-CM | POA: Diagnosis not present

## 2016-11-15 DIAGNOSIS — M6281 Muscle weakness (generalized): Secondary | ICD-10-CM | POA: Diagnosis not present

## 2016-11-15 DIAGNOSIS — M25511 Pain in right shoulder: Secondary | ICD-10-CM | POA: Diagnosis not present

## 2016-11-19 DIAGNOSIS — M25511 Pain in right shoulder: Secondary | ICD-10-CM | POA: Diagnosis not present

## 2016-11-19 DIAGNOSIS — M6281 Muscle weakness (generalized): Secondary | ICD-10-CM | POA: Diagnosis not present

## 2016-11-19 DIAGNOSIS — R293 Abnormal posture: Secondary | ICD-10-CM | POA: Diagnosis not present

## 2016-11-23 DIAGNOSIS — R293 Abnormal posture: Secondary | ICD-10-CM | POA: Diagnosis not present

## 2016-11-23 DIAGNOSIS — M6281 Muscle weakness (generalized): Secondary | ICD-10-CM | POA: Diagnosis not present

## 2016-11-23 DIAGNOSIS — M25511 Pain in right shoulder: Secondary | ICD-10-CM | POA: Diagnosis not present

## 2016-11-26 DIAGNOSIS — R293 Abnormal posture: Secondary | ICD-10-CM | POA: Diagnosis not present

## 2016-11-26 DIAGNOSIS — M25511 Pain in right shoulder: Secondary | ICD-10-CM | POA: Diagnosis not present

## 2016-11-26 DIAGNOSIS — M6281 Muscle weakness (generalized): Secondary | ICD-10-CM | POA: Diagnosis not present

## 2016-11-28 DIAGNOSIS — R293 Abnormal posture: Secondary | ICD-10-CM | POA: Diagnosis not present

## 2016-11-28 DIAGNOSIS — M6281 Muscle weakness (generalized): Secondary | ICD-10-CM | POA: Diagnosis not present

## 2016-11-28 DIAGNOSIS — M25511 Pain in right shoulder: Secondary | ICD-10-CM | POA: Diagnosis not present

## 2016-12-03 DIAGNOSIS — R293 Abnormal posture: Secondary | ICD-10-CM | POA: Diagnosis not present

## 2016-12-03 DIAGNOSIS — M6281 Muscle weakness (generalized): Secondary | ICD-10-CM | POA: Diagnosis not present

## 2016-12-03 DIAGNOSIS — M25511 Pain in right shoulder: Secondary | ICD-10-CM | POA: Diagnosis not present

## 2016-12-05 DIAGNOSIS — R293 Abnormal posture: Secondary | ICD-10-CM | POA: Diagnosis not present

## 2016-12-05 DIAGNOSIS — M6281 Muscle weakness (generalized): Secondary | ICD-10-CM | POA: Diagnosis not present

## 2016-12-05 DIAGNOSIS — M25511 Pain in right shoulder: Secondary | ICD-10-CM | POA: Diagnosis not present

## 2016-12-11 ENCOUNTER — Ambulatory Visit: Payer: Medicare Other | Admitting: Neurology

## 2016-12-11 DIAGNOSIS — M6281 Muscle weakness (generalized): Secondary | ICD-10-CM | POA: Diagnosis not present

## 2016-12-11 DIAGNOSIS — R293 Abnormal posture: Secondary | ICD-10-CM | POA: Diagnosis not present

## 2016-12-11 DIAGNOSIS — M25511 Pain in right shoulder: Secondary | ICD-10-CM | POA: Diagnosis not present

## 2016-12-13 ENCOUNTER — Encounter: Payer: Self-pay | Admitting: Neurology

## 2016-12-13 ENCOUNTER — Ambulatory Visit (INDEPENDENT_AMBULATORY_CARE_PROVIDER_SITE_OTHER): Payer: Medicare Other | Admitting: Neurology

## 2016-12-13 VITALS — BP 122/65 | HR 74 | Ht 62.0 in | Wt 123.0 lb

## 2016-12-13 DIAGNOSIS — G500 Trigeminal neuralgia: Secondary | ICD-10-CM | POA: Diagnosis not present

## 2016-12-13 NOTE — Progress Notes (Signed)
PATIENT: Joy Lynch DOB: 10-22-1925  Chief Complaint  Patient presents with  . Trigeminal Neuralgia    She saw Dr. Verlin Fester, neurologist at [facility], during her last flare-up of pain.  Her gabapentin was increased to 300mg , four times daily.  Her pain is much better.     HISTORICAL  Joy Lynch is a 83 year old right-handed female, seen in refer by her primary care PA Michel Harrow for evaluation of left-sided facial pain, initial evaluation was on August 13 2016.  She had a history of left trigeminal neuralgia since 1998, she presented with intermittent left facial pain, involving left lower eyelid, left upper jaw, for few years, she had frequent flareup, failed to medication treatment, eventually had gamma knife treatment in 2011, which helped her symptoms. She has been pain-free for 2 years, around 2013, she began to have intermittent recurrent left facial pain, but only mild, far in between, lasting 7-10 days,  Since October 2017, she began to experience increased facial pain, despite taking gabapentin 100/100/200 mg, she still complains of frequent left facial pain, present 50% of the daytime, it very annoying for her,  She also had a history of chronic lymphocytic anemia, was treated by oncologist at [facility], she was treated with chemotherapy twice.  She lives in independent living, denies gait abnormality, drive to clinic today.  UPDATE December 13 2016: She was seen by [facility] neurologist Dr. Verlin Fester on November 01 2016, was given a prescription of gabapentin 300 mg, she is now taking 4 tablets a day,  She has no pain, does complains of fatigue, lack of stamina,  she lives at independent living, still drives,   REVIEW OF SYSTEMS: Full 14 system review of systems performed and notable only for eye itching, tremor, incontinence of bladder, restless leg  ALLERGIES: Allergies  Allergen Reactions  . Penicillins Hives  . Tegretol [Carbamazepine] Other (See  Comments)    Turned purple from neck down and drowsy  . Lyrica [Pregabalin] Other (See Comments)    Drowsy    HOME MEDICATIONS: Current Outpatient Prescriptions  Medication Sig Dispense Refill  . alendronate (FOSAMAX) 70 MG tablet Take 70 mg by mouth once a week.  2  . aspirin 81 MG tablet Take 81 mg by mouth daily.    . calcium-vitamin D (OSCAL WITH D) 500-200 MG-UNIT tablet Take 1 tablet by mouth daily.     Marland Kitchen gabapentin (NEURONTIN) 300 MG capsule Take 300 mg by mouth 3 (three) times daily as needed.    Marland Kitchen levothyroxine (SYNTHROID, LEVOTHROID) 75 MCG tablet Take 75 mcg by mouth daily. Six days per week  1  . Omega-3 Fatty Acids (FISH OIL PO) Take by mouth 2 (two) times daily.    . propranolol (INDERAL) 10 MG tablet Take 10 mg by mouth daily.     No current facility-administered medications for this visit.     PAST MEDICAL HISTORY: Past Medical History:  Diagnosis Date  . Cancer [facility])    non hodgkin lymphoma  . Chronic lymphocytic leukemia ([place])   . History of B-cell lymphoma 11/02/2015  . Migraines   . Osteoporosis   . Thyroid disease   . Trigeminal neuralgia of left side of face     PAST SURGICAL HISTORY: Past Surgical History:  Procedure Laterality Date  . gamma knife     for trigeminal neuralgia  . SHOULDER SURGERY     Right shoulder replacement  . TONSILLECTOMY AND ADENOIDECTOMY    . WISDOM TOOTH EXTRACTION  FAMILY HISTORY: Family History  Problem Relation Age of Onset  . Cancer Father     unknown ca  . Pneumonia Father   . Cancer Brother     prostate ca  . Stroke Brother   . Heart attack Mother     SOCIAL HISTORY:  Social History   Social History  . Marital status: Divorced    Spouse name: N/A  . Number of children: 0  . Years of education: Masters   Occupational History  . Retired    Social History Main Topics  . Smoking status: Former Smoker    Packs/day: 0.50    Years: 20.00    Quit date: 10/02/1975  . Smokeless tobacco: Never Used  .  Alcohol use 1.2 oz/week    2 Glasses of wine per week  . Drug use: No  . Sexual activity: No     Comment: divorced, no children, sister in law next of kin. 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 Not on file   Social History Narrative   Lives at home alone.   Right-handed.   4 cups caffeine per day.     PHYSICAL EXAM   Vitals:   12/13/16 1453  BP: 122/65  Pulse: 74  Weight: 123 lb (55.8 kg)  Height: 5\' 2"  (1.575 m)    Not recorded      Body mass index is 22.5 kg/m.  PHYSICAL EXAMNIATION:  Gen: NAD, conversant, well nourised, obese, well groomed                     Cardiovascular: Regular rate rhythm, no peripheral edema, warm, nontender. Eyes: Conjunctivae clear without exudates or hemorrhage Neck: Supple, no carotid bruits. Pulmonary: Clear to auscultation bilaterally   NEUROLOGICAL EXAM:  MENTAL STATUS: Speech:    Speech is normal; fluent and spontaneous with normal comprehension.  Cognition:     Orientation to time, place and person     Normal recent and remote memory     Normal Attention span and concentration     Normal Language, naming, repeating,spontaneous speech     Fund of knowledge   CRANIAL NERVES: CN II: Visual fields are full to confrontation. Fundoscopic exam is normal with sharp discs and no vascular changes. Pupils are round equal and briskly reactive to light. CN III, IV, VI: extraocular movement are normal. No ptosis. CN V: Facial sensation is intact to pinprick in all 3 divisions bilaterally. Corneal responses are intact.  CN VII: Face is symmetric with normal eye closure and smile. CN VIII: Hearing is normal to rubbing fingers CN IX, X: Palate elevates symmetrically. Phonation is normal. CN XI: Head turning and shoulder shrug are intact CN XII: Tongue is midline with normal movements and no atrophy.  MOTOR: There is no pronator drift of out-stretched arms. Muscle bulk and tone are normal. Muscle strength is  normal.  REFLEXES: Reflexes are 2+ and symmetric at the biceps, triceps, knees, and ankles. Plantar responses are flexor.  SENSORY: Intact to light touch, pinprick, positional sensation and vibratory sensation are intact in fingers and toes.  COORDINATION: Rapid alternating movements and fine finger movements are intact. There is no dysmetria on finger-to-nose and heel-knee-shin.    GAIT/STANCE: Posture is normal. Gait is steady with normal steps, base, arm swing, and turning. Heel and toe walking are normal. Tandem gait is normal.  Romberg is absent.   DIAGNOSTIC DATA (LABS, IMAGING, TESTING) - I reviewed patient records, labs, notes,  testing and imaging myself where available.   ASSESSMENT AND PLAN  Joy Lynch is a 83 y.o. female   Left trigeminal neuralgia  Higher dose of gabapentin 300 mg 4 tablets every day has been very helpful,  Mild side effect of lack of stamina  If her left trigeminal pain has much improved, I have advised her to gradually tapering down the gabapentin to lower dose  Marcial Pacas, M.D. Ph.D.  [facility] [address] [place], [place] [address] Ph: [phone] Fax: [phone]  CC: Michel Harrow, PA-C

## 2016-12-19 DIAGNOSIS — R293 Abnormal posture: Secondary | ICD-10-CM | POA: Diagnosis not present

## 2016-12-19 DIAGNOSIS — M6281 Muscle weakness (generalized): Secondary | ICD-10-CM | POA: Diagnosis not present

## 2016-12-19 DIAGNOSIS — M25511 Pain in right shoulder: Secondary | ICD-10-CM | POA: Diagnosis not present

## 2016-12-24 ENCOUNTER — Encounter: Payer: Self-pay | Admitting: Hematology and Oncology

## 2016-12-24 ENCOUNTER — Other Ambulatory Visit (HOSPITAL_BASED_OUTPATIENT_CLINIC_OR_DEPARTMENT_OTHER): Payer: Medicare Other

## 2016-12-24 ENCOUNTER — Telehealth: Payer: Self-pay | Admitting: Hematology and Oncology

## 2016-12-24 ENCOUNTER — Ambulatory Visit (HOSPITAL_BASED_OUTPATIENT_CLINIC_OR_DEPARTMENT_OTHER): Payer: Medicare Other | Admitting: Hematology and Oncology

## 2016-12-24 DIAGNOSIS — C8588 Other specified types of non-Hodgkin lymphoma, lymph nodes of multiple sites: Secondary | ICD-10-CM | POA: Diagnosis not present

## 2016-12-24 DIAGNOSIS — Z8572 Personal history of non-Hodgkin lymphomas: Secondary | ICD-10-CM

## 2016-12-24 DIAGNOSIS — M25511 Pain in right shoulder: Secondary | ICD-10-CM | POA: Diagnosis not present

## 2016-12-24 DIAGNOSIS — D630 Anemia in neoplastic disease: Secondary | ICD-10-CM | POA: Insufficient documentation

## 2016-12-24 DIAGNOSIS — M6281 Muscle weakness (generalized): Secondary | ICD-10-CM | POA: Diagnosis not present

## 2016-12-24 DIAGNOSIS — R293 Abnormal posture: Secondary | ICD-10-CM | POA: Diagnosis not present

## 2016-12-24 LAB — COMPREHENSIVE METABOLIC PANEL
ALT: 17 U/L (ref 0–55)
AST: 27 U/L (ref 5–34)
Albumin: 3.3 g/dL — ABNORMAL LOW (ref 3.5–5.0)
Alkaline Phosphatase: 43 U/L (ref 40–150)
Anion Gap: 8 mEq/L (ref 3–11)
BUN: 20 mg/dL (ref 7.0–26.0)
CO2: 28 mEq/L (ref 22–29)
Calcium: 9.9 mg/dL (ref 8.4–10.4)
Chloride: 105 mEq/L (ref 98–109)
Creatinine: 0.9 mg/dL (ref 0.6–1.1)
EGFR: 55 mL/min/{1.73_m2} — ABNORMAL LOW (ref >90)
Glucose: 104 mg/dl (ref 70–140)
Potassium: 4.5 mEq/L (ref 3.5–5.1)
Sodium: 142 mEq/L (ref 136–145)
Total Bilirubin: 0.77 mg/dL (ref 0.20–1.20)
Total Protein: 9.3 g/dL — ABNORMAL HIGH (ref 6.4–8.3)

## 2016-12-24 LAB — CBC WITH DIFFERENTIAL/PLATELET
BASO%: 0.5 % (ref 0.0–2.0)
Basophils Absolute: 0.1 10*3/uL (ref 0.0–0.1)
EOS%: 0.6 % (ref 0.0–7.0)
Eosinophils Absolute: 0.1 10*3/uL (ref 0.0–0.5)
HCT: 33.4 % — ABNORMAL LOW (ref 34.8–46.6)
HGB: 11.1 g/dL — ABNORMAL LOW (ref 11.6–15.9)
LYMPH%: 67.7 % — ABNORMAL HIGH (ref 14.0–49.7)
MCH: 33.1 pg (ref 25.1–34.0)
MCHC: 33.4 g/dL (ref 31.5–36.0)
MCV: 99.1 fL (ref 79.5–101.0)
MONO#: 0.5 10*3/uL (ref 0.1–0.9)
MONO%: 4.2 % (ref 0.0–14.0)
NEUT#: 3.1 10*3/uL (ref 1.5–6.5)
NEUT%: 27 % — ABNORMAL LOW (ref 38.4–76.8)
Platelets: 149 10*3/uL (ref 145–400)
RBC: 3.37 10*6/uL — ABNORMAL LOW (ref 3.70–5.45)
RDW: 13.6 % (ref 11.2–14.5)
WBC: 11.4 10*3/uL — ABNORMAL HIGH (ref 3.9–10.3)
lymph#: 7.8 10*3/uL — ABNORMAL HIGH (ref 0.9–3.3)

## 2016-12-24 LAB — LACTATE DEHYDROGENASE: LDH: 346 U/L — ABNORMAL HIGH (ref 125–245)

## 2016-12-24 LAB — TECHNOLOGIST REVIEW

## 2016-12-24 MED ORDER — IBRUTINIB 280 MG PO TABS: 280.0000 mg | ORAL_TABLET | Freq: Every day | ORAL | 11 refills | Status: DC

## 2016-12-24 NOTE — Progress Notes (Signed)
[facility] OFFICE PROGRESS NOTE  Patient Care Team: Michel Harrow, PA-C as PCP - General (Physician Assistant)  SUMMARY OF ONCOLOGIC HISTORY:  Joy Lynch is here because of diagnosis of lymphoma. I will review her outside records amounting to almost 100 pages and collaborated the history with the patient. The patient initially presented in another facility with extreme leukocytosis. CBC from 12/11/2011 show white count of 107.8, hemoglobin 11.1 and platelet count of 145,000. 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 She received treatment with Rituximab weekly x 4 with good response to treatment.  From January 2014 to June 2014, she received single agent Bendamustine. Her last imaging study from 11/18/2014 with CT scan of the chest, abdomen and pelvis show complete resolution of disease. Her oncologist was Dr. Sharen Hint from [facility]. She subsequently moved to [place] to reside in an assisted living facility.  INTERVAL HISTORY: Please see below for problem oriented charting. She returns for further follow-up. She complain of fatigue. She denies lymphadenopathy or recent infection. The patient denies any recent signs or symptoms of bleeding such as spontaneous epistaxis, hematuria or hematochezia.   REVIEW OF SYSTEMS:   Constitutional: Denies fevers, chills or abnormal weight loss Eyes: Denies blurriness of vision Ears, nose, mouth, throat, and face: Denies mucositis or sore throat Respiratory: Denies cough, dyspnea or wheezes Cardiovascular: Denies palpitation, chest discomfort or lower extremity swelling Gastrointestinal:  Denies nausea, heartburn or change in bowel habits Skin: Denies abnormal skin rashes Lymphatics: Denies new lymphadenopathy or easy bruising Neurological:Denies numbness, tingling or new weaknesses Behavioral/Psych: Mood is stable, no new changes  All other systems  were reviewed with the patient and are negative.  I have reviewed the past medical history, past surgical history, social history and family history with the patient and they are unchanged from previous note.  ALLERGIES:  is allergic to penicillins; tegretol [carbamazepine]; and lyrica [pregabalin].  MEDICATIONS:  Current Outpatient Prescriptions  Medication Sig Dispense Refill  . alendronate (FOSAMAX) 70 MG tablet Take 70 mg by mouth once a week.  2  . aspirin 81 MG tablet Take 81 mg by mouth daily.    . calcium-vitamin D (OSCAL WITH D) 500-200 MG-UNIT tablet Take 1 tablet by mouth daily.     . fluticasone (FLONASE) 50 MCG/ACT nasal spray     . gabapentin (NEURONTIN) 300 MG capsule Take 300 mg by mouth 3 (three) times daily as needed.    Marland Kitchen levothyroxine (SYNTHROID, LEVOTHROID) 75 MCG tablet Take 75 mcg by mouth daily. Six days per week  1  . Omega-3 Fatty Acids (FISH OIL PO) Take by mouth 2 (two) times daily.    . propranolol (INDERAL) 10 MG tablet Take 10 mg by mouth daily.    . Ibrutinib 280 MG TABS Take 280 mg by mouth daily. 30 tablet 11   No current facility-administered medications for this visit.     PHYSICAL EXAMINATION: ECOG PERFORMANCE STATUS: 1 - Symptomatic but completely ambulatory  Vitals:   12/24/16 1100  BP: (!) 120/52  Pulse: 84  Resp: (!) 84  Temp: 97.5 F (36.4 C)   Filed Weights   12/24/16 1100  Weight: 123 lb 6.4 oz (56 kg)    GENERAL:alert, no distress and comfortable SKIN: skin color, texture, turgor are normal, no rashes or significant lesions EYES: normal, Conjunctiva are pink and non-injected, sclera clear OROPHARYNX:no exudate, no erythema and lips, buccal mucosa, and tongue normal  NECK: supple, thyroid normal size,  non-tender, without nodularity LYMPH: She has palpable lymphadenopathy on exam especially in the axillary region LUNGS: clear to auscultation and percussion with normal breathing effort HEART: regular rate & rhythm and no murmurs  and no lower extremity edema ABDOMEN:abdomen soft, non-tender and normal bowel sounds Musculoskeletal:no cyanosis of digits and no clubbing  NEURO: alert & oriented x 3 with fluent speech, no focal motor/sensory deficits  LABORATORY DATA:  I have reviewed the data as listed    Component Value Date/Time   NA 142 12/24/2016 1045   K 4.5 12/24/2016 1045   CO2 28 12/24/2016 1045   GLUCOSE 104 12/24/2016 1045   BUN 20.0 12/24/2016 1045   CREATININE 0.9 12/24/2016 1045   CALCIUM 9.9 12/24/2016 1045   PROT 9.3 (H) 12/24/2016 1045   ALBUMIN 3.3 (L) 12/24/2016 1045   AST 27 12/24/2016 1045   ALT 17 12/24/2016 1045   ALKPHOS 43 12/24/2016 1045   BILITOT 0.77 12/24/2016 1045    No results found for: SPEP, UPEP  Lab Results  Component Value Date   WBC 11.4 (H) 12/24/2016   NEUTROABS 3.1 12/24/2016   HGB 11.1 (L) 12/24/2016   HCT 33.4 (L) 12/24/2016   MCV 99.1 12/24/2016   PLT 149 12/24/2016      Chemistry      Component Value Date/Time   NA 142 12/24/2016 1045   K 4.5 12/24/2016 1045   CO2 28 12/24/2016 1045   BUN 20.0 12/24/2016 1045   CREATININE 0.9 12/24/2016 1045      Component Value Date/Time   CALCIUM 9.9 12/24/2016 1045   ALKPHOS 43 12/24/2016 1045   AST 27 12/24/2016 1045   ALT 17 12/24/2016 1045   BILITOT 0.77 12/24/2016 1045       ASSESSMENT & PLAN:  History of B-cell lymphoma Unfortunately, clinical exam and blood work show evidence of disease recurrence. I recommend PET CT scan to stage her disease. We discussed treatment options. The patient will qualify for possible treatment with the pertinent. Previously, she tolerated rituximab poorly but tolerated bendamustine well. I will try to get insurance prior authorization for ibrutinib and to determine the cause of her treatment. If she cannot get financial assistance to pay for it, we will consider intravenous palliative chemotherapy. She agreed. Plan to see her back in 2 weeks to review test  results  Anemia in neoplastic disease This is likely anemia of chronic disease and from her disease. The patient denies recent history of bleeding such as epistaxis, hematuria or hematochezia. She is asymptomatic from the anemia. We will observe for now.  She does not require transfusion now.     Orders Placed This Encounter  Procedures  . NM PET Image Initial (PI) Skull Base To Thigh    Standing Status:   Future    Standing Expiration Date:   02/23/2018    Order Specific Question:   Reason for Exam (SYMPTOM  OR DIAGNOSIS REQUIRED)    Answer:   lymphoma recurrence    Order Specific Question:   Preferred imaging location?    Answer:   [facility]   All questions were answered. The patient knows to call the clinic with any problems, questions or concerns. No barriers to learning was detected. I spent 25 minutes counseling the patient face to face. The total time spent in the appointment was 30 minutes and more than 50% was on counseling and review of test results     Heath Lark, MD 12/24/2016 12:23 PM

## 2016-12-24 NOTE — Telephone Encounter (Signed)
Gave patient avs report and appointments for April. Central radiology will call re scan.  °

## 2016-12-24 NOTE — Assessment & Plan Note (Signed)
Unfortunately, clinical exam and blood work show evidence of disease recurrence. I recommend PET CT scan to stage her disease. We discussed treatment options. The patient will qualify for possible treatment with the pertinent. Previously, she tolerated rituximab poorly but tolerated bendamustine well. I will try to get insurance prior authorization for ibrutinib and to determine the cause of her treatment. If she cannot get financial assistance to pay for it, we will consider intravenous palliative chemotherapy. She agreed. Plan to see her back in 2 weeks to review test results

## 2016-12-24 NOTE — Assessment & Plan Note (Signed)
This is likely anemia of chronic disease and from her disease. The patient denies recent history of bleeding such as epistaxis, hematuria or hematochezia. She is asymptomatic from the anemia. We will observe for now.  She does not require transfusion now.

## 2016-12-25 LAB — IGG, IGA, IGM
IgA, Qn, Serum: 264 mg/dL (ref 64–422)
IgG, Qn, Serum: 542 mg/dL — ABNORMAL LOW (ref 700–1600)
IgM, Qn, Serum: 2752 mg/dL — ABNORMAL HIGH (ref 26–217)

## 2016-12-26 ENCOUNTER — Telehealth: Payer: Self-pay | Admitting: Pharmacist

## 2016-12-26 NOTE — Telephone Encounter (Signed)
Received new Imbruvica prescription for marginal zone lymphoma. Imbruvica prescription faxed to [facility] outpatient pharmacy.  Labs reviewed in epic - ok for treatment. 12/24/16:  BP 120/52, HR 84, CrCl ~80ml/min  Imbruvica dose has been reduced due to patient age, previous treatments, concurrent medications. Imbruvica dose is appropriate for patient and indication. Current medication list in epic assess for drug/drug interactions with Imbruvica: Imbruvica/Omega-3 Fatty Acids and Imbruvica/Aspirin:  Enhancement of anitplatelet effects, possible increased bruising, bleeding events. Notified MD to discuss risk/benefit of continuing these agents if/when she starts Imbruvica.   Patient may decide on IV chemotherapy if there is prohibitive cost with Imbruvica.  Will continue to follow, Raul Del, PharmD, BCPS, [facility] [phone]

## 2016-12-27 NOTE — Telephone Encounter (Signed)
Oral Chemotherapy Pharmacist Encounter  Prior authorization submitted on CoverMyMeds Status is pending Key [id number] May take 72 hours for determination  Oral Oncology Clinic will continue to follow.  Johny Drilling, PharmD, BCPS, BCOP 12/27/2016  10:01 AM Oral Oncology Clinic [phone]

## 2017-01-01 DIAGNOSIS — M25511 Pain in right shoulder: Secondary | ICD-10-CM | POA: Diagnosis not present

## 2017-01-01 DIAGNOSIS — M6281 Muscle weakness (generalized): Secondary | ICD-10-CM | POA: Diagnosis not present

## 2017-01-01 DIAGNOSIS — R293 Abnormal posture: Secondary | ICD-10-CM | POA: Diagnosis not present

## 2017-01-02 DIAGNOSIS — M25511 Pain in right shoulder: Secondary | ICD-10-CM | POA: Diagnosis not present

## 2017-01-02 DIAGNOSIS — M6281 Muscle weakness (generalized): Secondary | ICD-10-CM | POA: Diagnosis not present

## 2017-01-02 DIAGNOSIS — R293 Abnormal posture: Secondary | ICD-10-CM | POA: Diagnosis not present

## 2017-01-02 NOTE — Telephone Encounter (Signed)
Oral Chemotherapy Pharmacist Encounter  Received notification from [employer] that PA for Imbruvica has been approved Effective dates: 12/27/16-09/30/17 Reference# [id number]  I called [facility] to have the process prescription, copayment $2918.49 Oral Oncology Clinic will attempt to find copayment assistance.  Johny Drilling, PharmD, BCPS, BCOP 01/02/2017  2:31 PM Oral Oncology Clinic [phone]

## 2017-01-04 ENCOUNTER — Ambulatory Visit (HOSPITAL_COMMUNITY)
Admission: RE | Admit: 2017-01-04 | Discharge: 2017-01-04 | Disposition: A | Discharge status: Home / Self Care | Payer: Medicare Other | Source: Ambulatory Visit | Attending: Hematology and Oncology | Admitting: Hematology and Oncology

## 2017-01-04 DIAGNOSIS — N8189 Other female genital prolapse: Secondary | ICD-10-CM | POA: Insufficient documentation

## 2017-01-04 DIAGNOSIS — R590 Localized enlarged lymph nodes: Secondary | ICD-10-CM | POA: Insufficient documentation

## 2017-01-04 DIAGNOSIS — K5730 Diverticulosis of large intestine without perforation or abscess without bleeding: Secondary | ICD-10-CM | POA: Diagnosis not present

## 2017-01-04 DIAGNOSIS — Z7982 Long term (current) use of aspirin: Secondary | ICD-10-CM | POA: Insufficient documentation

## 2017-01-04 DIAGNOSIS — C8588 Other specified types of non-Hodgkin lymphoma, lymph nodes of multiple sites: Secondary | ICD-10-CM | POA: Diagnosis not present

## 2017-01-04 DIAGNOSIS — N200 Calculus of kidney: Secondary | ICD-10-CM | POA: Diagnosis not present

## 2017-01-04 DIAGNOSIS — C8590 Non-Hodgkin lymphoma, unspecified, unspecified site: Secondary | ICD-10-CM | POA: Diagnosis not present

## 2017-01-04 DIAGNOSIS — R161 Splenomegaly, not elsewhere classified: Secondary | ICD-10-CM | POA: Diagnosis not present

## 2017-01-04 DIAGNOSIS — Z79899 Other long term (current) drug therapy: Secondary | ICD-10-CM | POA: Diagnosis not present

## 2017-01-04 LAB — GLUCOSE, CAPILLARY: Glucose-Capillary: 97 mg/dL (ref 65–99)

## 2017-01-04 MED ORDER — FLUDEOXYGLUCOSE F - 18 (FDG) INJECTION
6.1100 | Freq: Once | INTRAVENOUS | Status: AC | PRN
  Administered 2017-01-04: 6.11 via INTRAVENOUS

## 2017-01-07 DIAGNOSIS — M6281 Muscle weakness (generalized): Secondary | ICD-10-CM | POA: Diagnosis not present

## 2017-01-07 DIAGNOSIS — R293 Abnormal posture: Secondary | ICD-10-CM | POA: Diagnosis not present

## 2017-01-07 DIAGNOSIS — M25511 Pain in right shoulder: Secondary | ICD-10-CM | POA: Diagnosis not present

## 2017-01-08 ENCOUNTER — Ambulatory Visit (HOSPITAL_BASED_OUTPATIENT_CLINIC_OR_DEPARTMENT_OTHER): Payer: Medicare Other | Admitting: Hematology and Oncology

## 2017-01-08 ENCOUNTER — Encounter: Payer: Self-pay | Admitting: Hematology and Oncology

## 2017-01-08 DIAGNOSIS — C8588 Other specified types of non-Hodgkin lymphoma, lymph nodes of multiple sites: Secondary | ICD-10-CM

## 2017-01-08 NOTE — Assessment & Plan Note (Signed)
The overall constellation of elevated IgM, splenomegaly with mild lymphadenopathy, blood count changes, are all compatible with relapse of her disease We discussed treatment options. Previously, she did not tolerate rituximab.  She tolerated bendamustine well We reviewed the current guidelines.  She will qualify from treatment with ibrutinib. We discussed some of the risks, benefits, side effects of each option. With ibrutinib, her major concern is related to financial burden. I will get assistance from my pharmacist to find out the cause of her copayment. I will call her next week. She would like to go home and think about each options and we will discuss final treatment plan next week.

## 2017-01-08 NOTE — Progress Notes (Signed)
[facility] OFFICE PROGRESS NOTE  Patient Care Team: Michel Harrow, PA-C as PCP - General (Physician Assistant)  SUMMARY OF ONCOLOGIC HISTORY:   Lymphoma, marginal zone, lymph nodes of multiple sites ([place])   01/04/2017 PET scan    1. Mildly enlarged retroperitoneal lymph nodes are present including a 1.4 cm node anterior to the aortic bifurcation which demonstrates low-grade metabolic activity with maximum SUV 2.5. This would be considered Deauville 3 activity. 2. Splenomegaly with splenic volume of 540 cc, but no focal splenic lesions seen. 3. Other imaging findings of potential clinical significance: Considerable atherosclerosis. Bilateral nonobstructive nephrolithiasis. Extensive sigmoid colon diverticulosis. Pelvic floor laxity. Right shoulder prosthesis with activity along the joint margins likely inflammatory. Old pelvic fractures with sclerotic late phase healing response.       The patient initially presented in another facility with extreme leukocytosis. CBC from 12/11/2011 show white count of 107.8, hemoglobin 11.1 and platelet count of 145,000. 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 She received treatment with Rituximab weekly x 4 with good response to treatment.  From January 2014 to June 2014, she received single agent Bendamustine. Her last imaging study from 11/18/2014 with CT scan of the chest, abdomen and pelvis show complete resolution of disease. Her oncologist was Dr. Sharen Hint from [facility]. She subsequently moved to [place] to reside in an assisted living facility.  INTERVAL HISTORY: Please see below for problem oriented charting. She feels well. No recent new symptoms since I saw her.  She bruises easily  REVIEW OF SYSTEMS:   Constitutional: Denies fevers, chills or abnormal weight loss Eyes: Denies blurriness of vision Ears, nose, mouth, throat, and face: Denies  mucositis or sore throat Respiratory: Denies cough, dyspnea or wheezes Cardiovascular: Denies palpitation, chest discomfort or lower extremity swelling Gastrointestinal:  Denies nausea, heartburn or change in bowel habits Skin: Denies abnormal skin rashes Lymphatics: Denies new lymphadenopathy  Neurological:Denies numbness, tingling or new weaknesses Behavioral/Psych: Mood is stable, no new changes  All other systems were reviewed with the patient and are negative.  I have reviewed the past medical history, past surgical history, social history and family history with the patient and they are unchanged from previous note.  ALLERGIES:  is allergic to penicillins; tegretol [carbamazepine]; and lyrica [pregabalin].  MEDICATIONS:  Current Outpatient Prescriptions  Medication Sig Dispense Refill  . alendronate (FOSAMAX) 70 MG tablet Take 70 mg by mouth once a week.  2  . aspirin 81 MG tablet Take 81 mg by mouth daily.    . calcium-vitamin D (OSCAL WITH D) 500-200 MG-UNIT tablet Take 1 tablet by mouth daily.     . fluticasone (FLONASE) 50 MCG/ACT nasal spray     . gabapentin (NEURONTIN) 300 MG capsule Take 300 mg by mouth 3 (three) times daily as needed.    . Ibrutinib 280 MG TABS Take 280 mg by mouth daily. 30 tablet 11  . levothyroxine (SYNTHROID, LEVOTHROID) 75 MCG tablet Take 75 mcg by mouth daily. Six days per week  1  . Omega-3 Fatty Acids (FISH OIL PO) Take by mouth 2 (two) times daily.    . propranolol (INDERAL) 10 MG tablet Take 10 mg by mouth daily.     No current facility-administered medications for this visit.     PHYSICAL EXAMINATION: ECOG PERFORMANCE STATUS: 1 - Symptomatic but completely ambulatory  Vitals:   01/08/17 1140  BP: (!) 114/41  Pulse: 79  Resp: 18  Temp: 97.6 F (  36.4 C)   Filed Weights   01/08/17 1140  Weight: 123 lb 3.2 oz (55.9 kg)    GENERAL:alert, no distress and comfortable SKIN: skin color, texture, turgor are normal, no rashes or  significant lesions. Noted skin bruising EYES: normal, Conjunctiva are pink and non-injected, sclera clear Musculoskeletal:no cyanosis of digits and no clubbing  NEURO: alert & oriented x 3 with fluent speech, no focal motor/sensory deficits  LABORATORY DATA:  I have reviewed the data as listed    Component Value Date/Time   NA 142 12/24/2016 1045   K 4.5 12/24/2016 1045   CO2 28 12/24/2016 1045   GLUCOSE 104 12/24/2016 1045   BUN 20.0 12/24/2016 1045   CREATININE 0.9 12/24/2016 1045   CALCIUM 9.9 12/24/2016 1045   PROT 9.3 (H) 12/24/2016 1045   ALBUMIN 3.3 (L) 12/24/2016 1045   AST 27 12/24/2016 1045   ALT 17 12/24/2016 1045   ALKPHOS 43 12/24/2016 1045   BILITOT 0.77 12/24/2016 1045    No results found for: SPEP, UPEP  Lab Results  Component Value Date   WBC 11.4 (H) 12/24/2016   NEUTROABS 3.1 12/24/2016   HGB 11.1 (L) 12/24/2016   HCT 33.4 (L) 12/24/2016   MCV 99.1 12/24/2016   PLT 149 12/24/2016      Chemistry      Component Value Date/Time   NA 142 12/24/2016 1045   K 4.5 12/24/2016 1045   CO2 28 12/24/2016 1045   BUN 20.0 12/24/2016 1045   CREATININE 0.9 12/24/2016 1045      Component Value Date/Time   CALCIUM 9.9 12/24/2016 1045   ALKPHOS 43 12/24/2016 1045   AST 27 12/24/2016 1045   ALT 17 12/24/2016 1045   BILITOT 0.77 12/24/2016 1045       RADIOGRAPHIC STUDIES: I have personally reviewed the radiological images as listed and agreed with the findings in the report. Nm Pet Image Initial (pi) Skull Base To Thigh  Result Date: 01/04/2017 CLINICAL DATA:  Initial treatment strategy for marginal zone B-cell lymphoma. EXAM: NUCLEAR MEDICINE PET SKULL BASE TO THIGH TECHNIQUE: 6.1 mCi F-18 FDG was injected intravenously. Full-ring PET imaging was performed from the skull base to thigh after the radiotracer. CT data was obtained and used for attenuation correction and anatomic localization. FASTING BLOOD GLUCOSE:  Value: 97 mg/dl COMPARISON:  None. FINDINGS:  NECK No hypermetabolic lymph nodes in the neck. Bilateral carotid atherosclerotic calcifications. CHEST No hypermetabolic mediastinal or hilar nodes. No suspicious pulmonary nodules on the CT scan. Coronary, aortic arch, and branch vessel atherosclerotic vascular disease. Bandlike scarring at the lung apices with some associated calcification along the pleuroparenchymal margin. This is a bilaterally symmetric process an without hypermetabolic activity. Slight nodularity along the right inferior pulmonary ligament without hypermetabolic activity. Small bilateral axillary and subpectoral lymph nodes are not hypermetabolic. For reference purposes, the mediastinal blood pool background activity is 2.3. ABDOMEN/PELVIS Splenomegaly is present, splenic measurements 12.7 by 5.4 by 15.0 cm (volume = 540 cm^3). Splenic activity is fairly homogeneous with a characteristic internal SUV max of 3.4. For comparison, hepatic SUV is 2.9. Mildly prominent retroperitoneal lymph nodes are observed. A node anterior to the bifurcation measures 1.4 cm in short axis with maximum SUV 2.5. Several small aortocaval and periaortic lymph nodes likewise have low-grade metabolic activity Aortoiliac atherosclerotic vascular disease. 1-2 mm left kidney upper pole nonobstructive renal calculus. 10 mm right kidney lower pole nonobstructive renal calculus. Several small gallstones are present. Fluid density oval-shaped 1.2 by 1.0 cm right  hepatic lobe lesion on image 115/4, probably a cyst. Extensive sigmoid colon diverticulosis. Scattered diverticula elsewhere in the colon. Pelvic floor laxity causing low position of the anorectal junction and some small bowel herniation along the laxity. Configuration of radiopharmaceutical in the left renal collecting system raises the possibility of duplicated collecting system. SKELETON Dextroconvex thoracolumbar and levoconvex lower lumbar scoliosis with rotary component. Abnormal bandlike sclerosis in the left  sacral ala suggesting remote fracture. Callus formation compatible with prior fractures in the right pubic rami and pubic body. Sclerosis in the left mid inferior pubic ramus likewise suspicious for fracture or stress fracture, none of these fracture sites appear hypermetabolic. Degenerative anterolisthesis at L4-5. The patient has a right shoulder prosthesis including a fairly long stem humeral component. There is hypermetabolic activity along the joint margins on the right which is most likely to be inflammatory. IMPRESSION: 1. Mildly enlarged retroperitoneal lymph nodes are present including a 1.4 cm node anterior to the aortic bifurcation which demonstrates low-grade metabolic activity with maximum SUV 2.5. This would be considered Deauville 3 activity. 2. Splenomegaly with splenic volume of 540 cc, but no focal splenic lesions seen. 3. Other imaging findings of potential clinical significance: Considerable atherosclerosis. Bilateral nonobstructive nephrolithiasis. Extensive sigmoid colon diverticulosis. Pelvic floor laxity. Right shoulder prosthesis with activity along the joint margins likely inflammatory. Old pelvic fractures with sclerotic late phase healing response. Electronically Signed   By: Van Clines M.D.   On: 01/04/2017 11:52    ASSESSMENT & PLAN:  Lymphoma, marginal zone, lymph nodes of multiple sites ([place]) The overall constellation of elevated IgM, splenomegaly with mild lymphadenopathy, blood count changes, are all compatible with relapse of her disease We discussed treatment options. Previously, she did not tolerate rituximab.  She tolerated bendamustine well We reviewed the current guidelines.  She will qualify from treatment with ibrutinib. We discussed some of the risks, benefits, side effects of each option. With ibrutinib, her major concern is related to financial burden. I will get assistance from my pharmacist to find out the cause of her copayment. I will call her next  week. She would like to go home and think about each options and we will discuss final treatment plan next week.   No orders of the defined types were placed in this encounter.  All questions were answered. The patient knows to call the clinic with any problems, questions or concerns. No barriers to learning was detected. I spent 15 minutes counseling the patient face to face. The total time spent in the appointment was 20 minutes and more than 50% was on counseling and review of test results     Heath Lark, MD 01/08/2017 12:15 PM

## 2017-01-09 DIAGNOSIS — M6281 Muscle weakness (generalized): Secondary | ICD-10-CM | POA: Diagnosis not present

## 2017-01-09 DIAGNOSIS — M25511 Pain in right shoulder: Secondary | ICD-10-CM | POA: Diagnosis not present

## 2017-01-09 DIAGNOSIS — R293 Abnormal posture: Secondary | ICD-10-CM | POA: Diagnosis not present

## 2017-01-14 DIAGNOSIS — R293 Abnormal posture: Secondary | ICD-10-CM | POA: Diagnosis not present

## 2017-01-14 DIAGNOSIS — M25511 Pain in right shoulder: Secondary | ICD-10-CM | POA: Diagnosis not present

## 2017-01-14 DIAGNOSIS — M6281 Muscle weakness (generalized): Secondary | ICD-10-CM | POA: Diagnosis not present

## 2017-01-15 NOTE — Telephone Encounter (Signed)
Oral Chemotherapy Pharmacist Encounter  Oral Oncology Clinic unable to identify foundation grant support for patient's diagnosis of marginal zone lymphoma. Next option would be to apply for manufacturer assistance to try to obtain Imbruvica at $0 cost from the drug company.  I called and LVM for patient with offer to assist with application process.  Oral Oncology Clinic will continue to follow.  Johny Drilling, PharmD, BCPS, BCOP 01/15/2017  1:24 PM Oral Oncology Clinic [phone]

## 2017-01-16 NOTE — Telephone Encounter (Signed)
Oral Chemotherapy Pharmacist Encounter  I spoke with patient this morning about copayment options for Imbruvica. Patient's income likely too high to qualify for manufacturer or foundation support. Patient would like to speak with dr. Alvy Bimler about other treatment options as the copayment for Imbruvica is prohibitively expensive.  Oral Oncology Clinic will continue to follow  Johny Drilling, PharmD, BCPS, BCOP 01/16/2017  9:18 AM Oral Oncology Clinic [phone]

## 2017-01-16 NOTE — Telephone Encounter (Signed)
OK, I will call her and schedule return appt

## 2017-01-23 ENCOUNTER — Ambulatory Visit (HOSPITAL_BASED_OUTPATIENT_CLINIC_OR_DEPARTMENT_OTHER): Payer: Medicare Other | Admitting: Hematology and Oncology

## 2017-01-23 ENCOUNTER — Telehealth: Payer: Self-pay | Admitting: Hematology and Oncology

## 2017-01-23 VITALS — BP 114/51 | HR 78 | Temp 97.7°F | Resp 18 | Ht 62.0 in | Wt 123.7 lb

## 2017-01-23 DIAGNOSIS — Z7189 Other specified counseling: Secondary | ICD-10-CM

## 2017-01-23 DIAGNOSIS — C8588 Other specified types of non-Hodgkin lymphoma, lymph nodes of multiple sites: Secondary | ICD-10-CM

## 2017-01-23 DIAGNOSIS — C884 Extranodal marginal zone B-cell lymphoma of mucosa-associated lymphoid tissue [MALT-lymphoma]: Secondary | ICD-10-CM | POA: Diagnosis not present

## 2017-01-23 MED ORDER — ALLOPURINOL 300 MG PO TABS: 300.0000 mg | ORAL_TABLET | Freq: Every day | ORAL | 3 refills | Status: DC

## 2017-01-23 MED ORDER — ONDANSETRON HCL 8 MG PO TABS: 8.0000 mg | ORAL_TABLET | Freq: Three times a day (TID) | ORAL | 1 refills | Status: DC | PRN

## 2017-01-23 MED ORDER — ACYCLOVIR 400 MG PO TABS: 400.0000 mg | ORAL_TABLET | Freq: Every day | ORAL | 3 refills | Status: DC

## 2017-01-23 MED ORDER — DEXAMETHASONE 4 MG PO TABS: 8.0000 mg | ORAL_TABLET | Freq: Every day | ORAL | 0 refills | Status: DC

## 2017-01-23 MED ORDER — PROCHLORPERAZINE MALEATE 10 MG PO TABS: 10.0000 mg | ORAL_TABLET | Freq: Four times a day (QID) | ORAL | 1 refills | Status: DC | PRN

## 2017-01-23 NOTE — Progress Notes (Signed)
START ON PATHWAY REGIMEN - Lymphoma and CLL     A cycle is every 28 days:     Bendamustine      Rituximab   **Always confirm dose/schedule in your pharmacy ordering system**    Patient Characteristics: Marginal Zone, Systemic, Second Line Disease Type: Marginal Zone Disease Type: Not Applicable Localized or Systemic Disease? Systemic [place] Stage: III Line of therapy: Second Line  Intent of Therapy: Non-Curative / Palliative Intent, Discussed with Patient

## 2017-01-23 NOTE — Telephone Encounter (Signed)
Message sent to Dr Alvy Bimler regarding tx starting on 05/09 due to availability. This is a HR tx and also a new start. Appointments scheduled for 05/009 and 05/10 vs 05/08 and 05/09. Patient was given a copy of the AVS report and appointment schedule per 01/23/17 los.

## 2017-01-24 ENCOUNTER — Encounter: Payer: Self-pay | Admitting: Hematology and Oncology

## 2017-01-24 ENCOUNTER — Other Ambulatory Visit: Payer: Self-pay | Admitting: Hematology and Oncology

## 2017-01-24 DIAGNOSIS — Z7189 Other specified counseling: Secondary | ICD-10-CM | POA: Insufficient documentation

## 2017-01-24 NOTE — Progress Notes (Signed)
[facility] OFFICE PROGRESS NOTE  Patient Care Team: Michel Harrow, PA-C as PCP - General (Physician Assistant)  SUMMARY OF ONCOLOGIC HISTORY:   Lymphoma, marginal zone, lymph nodes of multiple sites [facility])   10/26/2011 Initial Diagnosis    The patient was diagnosed with marginal zone lymphoma/lymphoplasmacytic lymphoma in [place].  She received single agent rituximab,  weekly 4, completed by May 2013.  She was subsequently found to have disease relapse, and was treated with single agent bendamustine from January 2014 to June 2014, 70 mg/m in [place]      05/12/2013 Imaging    Outside CT showed no evidence of disease      11/18/2014 Imaging    Outside CT scan show no evidence of disease      11/03/2015 Pathology Results    Peripheral Blood Flow Cytometry - MONOCLONAL B CELL POPULATION IDENTIFIED. - SEE COMMENT. Diagnosis Comment: The majority of lymphocytes consist of a monoclonal population of B cells expressing pan B cell antigens including CD20 with associated expression of CD5, CD25, CD11c, and FMC7. No CD10 or CD103 expression is identified. The peripheral blood shows a population of atypical lymphocytes ranging from small to large cells with scanty to moderately abundant cytoplasm, dense to partially clumped chromatin and small to inconspicuous nucleoli. The findings are consistent with a B-cell lymphoproliferative process. Consideration should be given to chronic lymphocytic leukemia (variant or transformation), mantle cell lymphoma and other B cell lymphomas with rare CD5 expression. FISH studies may be helpful in this regard      01/04/2017 PET scan    1. Mildly enlarged retroperitoneal lymph nodes are present including a 1.4 cm node anterior to the aortic bifurcation which demonstrates low-grade metabolic activity with maximum SUV 2.5. This would be considered Deauville 3 activity. 2. Splenomegaly with splenic volume of 540 cc, but no focal splenic lesions  seen. 3. Other imaging findings of potential clinical significance: Considerable atherosclerosis. Bilateral nonobstructive nephrolithiasis. Extensive sigmoid colon diverticulosis. Pelvic floor laxity. Right shoulder prosthesis with activity along the joint margins likely inflammatory. Old pelvic fractures with sclerotic late phase healing response.       INTERVAL HISTORY: Please see below for problem oriented charting. She returns today to discuss treatment options.  The cause of ibrutinib is prohibitive. In the meantime, she is not very symptomatic. She has numerous questions related to her recent imaging study. REVIEW OF SYSTEMS:   Constitutional: Denies fevers, chills or abnormal weight loss Eyes: Denies blurriness of vision Ears, nose, mouth, throat, and face: Denies mucositis or sore throat Respiratory: Denies cough, dyspnea or wheezes Cardiovascular: Denies palpitation, chest discomfort or lower extremity swelling Gastrointestinal:  Denies nausea, heartburn or change in bowel habits Skin: Denies abnormal skin rashes Lymphatics: Denies new lymphadenopathy or easy bruising Neurological:Denies numbness, tingling or new weaknesses Behavioral/Psych: Mood is stable, no new changes  All other systems were reviewed with the patient and are negative.  I have reviewed the past medical history, past surgical history, social history and family history with the patient and they are unchanged from previous note.  ALLERGIES:  is allergic to penicillins; tegretol [carbamazepine]; and lyrica [pregabalin].  MEDICATIONS:  Current Outpatient Prescriptions  Medication Sig Dispense Refill  . alendronate (FOSAMAX) 70 MG tablet Take 70 mg by mouth once a week.  2  . aspirin 81 MG tablet Take 81 mg by mouth daily.    . calcium-vitamin D (OSCAL WITH D) 500-200 MG-UNIT tablet Take 1 tablet by mouth daily.     Marland Kitchen  fluticasone (FLONASE) 50 MCG/ACT nasal spray     . gabapentin (NEURONTIN) 300 MG capsule  Take 300 mg by mouth 3 (three) times daily as needed.    Marland Kitchen levothyroxine (SYNTHROID, LEVOTHROID) 75 MCG tablet Take 75 mcg by mouth daily. Six days per week  1  . Omega-3 Fatty Acids (FISH OIL PO) Take by mouth 2 (two) times daily.    . propranolol (INDERAL) 10 MG tablet Take 10 mg by mouth daily.    Marland Kitchen acyclovir (ZOVIRAX) 400 MG tablet Take 1 tablet (400 mg total) by mouth daily. 30 tablet 3  . allopurinol (ZYLOPRIM) 300 MG tablet Take 1 tablet (300 mg total) by mouth daily. 30 tablet 3  . dexamethasone (DECADRON) 4 MG tablet Take 2 tablets (8 mg total) by mouth daily. 6 tablet 0  . Ibrutinib 280 MG TABS Take 280 mg by mouth daily. (Patient not taking: Reported on 01/23/2017) 30 tablet 11  . ondansetron (ZOFRAN) 8 MG tablet Take 1 tablet (8 mg total) by mouth every 8 (eight) hours as needed for refractory nausea / vomiting. 30 tablet 1  . prochlorperazine (COMPAZINE) 10 MG tablet Take 1 tablet (10 mg total) by mouth every 6 (six) hours as needed (Nausea or vomiting). 30 tablet 1   No current facility-administered medications for this visit.     PHYSICAL EXAMINATION: ECOG PERFORMANCE STATUS: 1 - Symptomatic but completely ambulatory  Vitals:   01/23/17 1400  BP: (!) 114/51  Pulse: 78  Resp: 18  Temp: 97.7 F (36.5 C)   Filed Weights   01/23/17 1400  Weight: 123 lb 11.2 oz (56.1 kg)    GENERAL:alert, no distress and comfortable SKIN: skin color, texture, turgor are normal, no rashes or significant lesions EYES: normal, Conjunctiva are pink and non-injected, sclera clear OROPHARYNX:no exudate, no erythema and lips, buccal mucosa, and tongue normal  NECK: supple, thyroid normal size, non-tender, without nodularity LYMPH:  no palpable lymphadenopathy in the cervical, axillary or inguinal LUNGS: clear to auscultation and percussion with normal breathing effort HEART: regular rate & rhythm and no murmurs and no lower extremity edema ABDOMEN:abdomen soft, non-tender and normal bowel  sounds Musculoskeletal:no cyanosis of digits and no clubbing  NEURO: alert & oriented x 3 with fluent speech, no focal motor/sensory deficits  LABORATORY DATA:  I have reviewed the data as listed    Component Value Date/Time   NA 142 12/24/2016 1045   K 4.5 12/24/2016 1045   CO2 28 12/24/2016 1045   GLUCOSE 104 12/24/2016 1045   BUN 20.0 12/24/2016 1045   CREATININE 0.9 12/24/2016 1045   CALCIUM 9.9 12/24/2016 1045   PROT 9.3 (H) 12/24/2016 1045   ALBUMIN 3.3 (L) 12/24/2016 1045   AST 27 12/24/2016 1045   ALT 17 12/24/2016 1045   ALKPHOS 43 12/24/2016 1045   BILITOT 0.77 12/24/2016 1045    No results found for: SPEP, UPEP  Lab Results  Component Value Date   WBC 11.4 (H) 12/24/2016   NEUTROABS 3.1 12/24/2016   HGB 11.1 (L) 12/24/2016   HCT 33.4 (L) 12/24/2016   MCV 99.1 12/24/2016   PLT 149 12/24/2016      Chemistry      Component Value Date/Time   NA 142 12/24/2016 1045   K 4.5 12/24/2016 1045   CO2 28 12/24/2016 1045   BUN 20.0 12/24/2016 1045   CREATININE 0.9 12/24/2016 1045      Component Value Date/Time   CALCIUM 9.9 12/24/2016 1045   ALKPHOS 43 12/24/2016 1045  AST 27 12/24/2016 1045   ALT 17 12/24/2016 1045   BILITOT 0.77 12/24/2016 1045       RADIOGRAPHIC STUDIES: I have personally reviewed the radiological images as listed and agreed with the findings in the report. Nm Pet Image Initial (pi) Skull Base To Thigh  Result Date: 01/04/2017 CLINICAL DATA:  Initial treatment strategy for marginal zone B-cell lymphoma. EXAM: NUCLEAR MEDICINE PET SKULL BASE TO THIGH TECHNIQUE: 6.1 mCi F-18 FDG was injected intravenously. Full-ring PET imaging was performed from the skull base to thigh after the radiotracer. CT data was obtained and used for attenuation correction and anatomic localization. FASTING BLOOD GLUCOSE:  Value: 97 mg/dl COMPARISON:  None. FINDINGS: NECK No hypermetabolic lymph nodes in the neck. Bilateral carotid atherosclerotic calcifications.  CHEST No hypermetabolic mediastinal or hilar nodes. No suspicious pulmonary nodules on the CT scan. Coronary, aortic arch, and branch vessel atherosclerotic vascular disease. Bandlike scarring at the lung apices with some associated calcification along the pleuroparenchymal margin. This is a bilaterally symmetric process an without hypermetabolic activity. Slight nodularity along the right inferior pulmonary ligament without hypermetabolic activity. Small bilateral axillary and subpectoral lymph nodes are not hypermetabolic. For reference purposes, the mediastinal blood pool background activity is 2.3. ABDOMEN/PELVIS Splenomegaly is present, splenic measurements 12.7 by 5.4 by 15.0 cm (volume = 540 cm^3). Splenic activity is fairly homogeneous with a characteristic internal SUV max of 3.4. For comparison, hepatic SUV is 2.9. Mildly prominent retroperitoneal lymph nodes are observed. A node anterior to the bifurcation measures 1.4 cm in short axis with maximum SUV 2.5. Several small aortocaval and periaortic lymph nodes likewise have low-grade metabolic activity Aortoiliac atherosclerotic vascular disease. 1-2 mm left kidney upper pole nonobstructive renal calculus. 10 mm right kidney lower pole nonobstructive renal calculus. Several small gallstones are present. Fluid density oval-shaped 1.2 by 1.0 cm right hepatic lobe lesion on image 115/4, probably a cyst. Extensive sigmoid colon diverticulosis. Scattered diverticula elsewhere in the colon. Pelvic floor laxity causing low position of the anorectal junction and some small bowel herniation along the laxity. Configuration of radiopharmaceutical in the left renal collecting system raises the possibility of duplicated collecting system. SKELETON Dextroconvex thoracolumbar and levoconvex lower lumbar scoliosis with rotary component. Abnormal bandlike sclerosis in the left sacral ala suggesting remote fracture. Callus formation compatible with prior fractures in the  right pubic rami and pubic body. Sclerosis in the left mid inferior pubic ramus likewise suspicious for fracture or stress fracture, none of these fracture sites appear hypermetabolic. Degenerative anterolisthesis at L4-5. The patient has a right shoulder prosthesis including a fairly long stem humeral component. There is hypermetabolic activity along the joint margins on the right which is most likely to be inflammatory. IMPRESSION: 1. Mildly enlarged retroperitoneal lymph nodes are present including a 1.4 cm node anterior to the aortic bifurcation which demonstrates low-grade metabolic activity with maximum SUV 2.5. This would be considered Deauville 3 activity. 2. Splenomegaly with splenic volume of 540 cc, but no focal splenic lesions seen. 3. Other imaging findings of potential clinical significance: Considerable atherosclerosis. Bilateral nonobstructive nephrolithiasis. Extensive sigmoid colon diverticulosis. Pelvic floor laxity. Right shoulder prosthesis with activity along the joint margins likely inflammatory. Old pelvic fractures with sclerotic late phase healing response. Electronically Signed   By: Van Clines M.D.   On: 01/04/2017 11:52    ASSESSMENT & PLAN:  Lymphoma, marginal zone, lymph nodes of multiple sites [facility]) I have reviewed the current guidelines and we have discussed extensively in regards to her imaging study  and treatment options. I do not feel strongly she needs a biopsy Her overall presentation is highly suspicious for recurrence of marginal zone lymphoma/lymphoplasmacytic lymphoma We discussed the risk and benefit of biopsy and she agreed to decline biopsy We have previously try to get her insurance prior authorization and payment assistance for ibrutinib Unfortunately, the cost is prohibitive She appears to tolerate prior treatment with bendamustine and rituximab well We discussed the risks, benefits, side effects of combination treatment of bendamustine and  rituximab and she agreed to proceed. We discussed the role of chemotherapy is of palliative intent The decision was made based on publication in the Blood: Randomized trial of bendamustine-rituximab or R-CHOP/R-CVP in first-line treatment of indolent NHL or MCL: the BRIGHT study.  [place] [address] Lucianne Lei der [address] [place] BS, [address] M, Kwan YL, Simpson D, Craig M, Kolibaba K, Issa S, [employer], [place] DM, [place] M, Aram Beecham JM SO  Blood. 2014;123(19):2944.  The chemotherapy consists of   1. Bendamustine at 90 mg/m2 on day 1 & 2 2. Rituximab at 375 mg/m2 on day 1 Each cycle + 28 days  Plan for 6 cycles total with or without Neulasta support  In the international phase III BRIGHT trial, 447 previously untreated patients with advanced stage follicular (n = 462 patients), mantle cell (n = 74 patients), or other indolent lymphoma were randomly assigned to six cycles of BR according to the same dose and schedule described above or to R-CHOP or R-CVP. BR resulted in similar complete (31 versus 25 percent) and overall (97 versus 91 percent) response rates. BR was associated with higher rates of vomiting and drug hypersensitivity and lower rates of peripheral neuropathy/paresthesia and alopecia. The use of prophylactic antiemetics was not specified in the protocol and was more common among patients assigned to R-CHOP.   We discussed some of the risks, benefits and side-effects of Rituximab with Bendamustine.   Some of the short term side-effects included, though not limited to, risk of fatigue, weight loss, tumor lysis syndrome, risk of allergic reactions, pancytopenia, life-threatening infections, need for transfusions of blood products, nausea, vomiting, change in bowel habits, admission to hospital for various reasons, and risks of death.   Long term side-effects are also discussed including permanent damage to nerve function, chronic fatigue, and rare secondary  malignancy including bone marrow disorders.   The patient is aware that the response rates discussed earlier is not guaranteed.    After a long discussion, patient made an informed decision to proceed with the prescribed plan of care.   Patient education material was dispensed Even though she is relatively asymptomatic, she is developing pancytopenia and serum IgM level is very high, raising the risk of thrombosis if we do not treat right now. Due to her age, I will plan to give her reduced dose bendamustine We discussed risk and benefits of port placement and she declined port placement I will schedule chemo education class and plan to start her treatment in about 2 weeks To reduce risk of infusion reaction, I will start her on steroid treatment for 3 days prior to stop date of treatment I would also recommend she takes allopurinol for tumor lysis prophylaxis I have also prescribed acyclovir for her to take as antimicrobial prophylaxis     Goals of care, counseling/discussion The patient is aware she has incurable disease and treatment is strictly palliative. We discussed importance of Advanced Directives and Living will. I will get assistance  from our social worker to help her fill out some paperwork. We discussed CODE STATUS; the patient desires to remain in full code.    Orders Placed This Encounter  Procedures  . CBC with Differential    Standing Status:   Standing    Number of Occurrences:   20    Standing Expiration Date:   01/24/2018  . Comprehensive metabolic panel    Standing Status:   Standing    Number of Occurrences:   20    Standing Expiration Date:   01/24/2018  . Hepatitis B surface antibody    Standing Status:   Future    Standing Expiration Date:   02/27/2018  . Hepatitis B core antibody, IgM    Standing Status:   Future    Standing Expiration Date:   02/27/2018  . Hepatitis B surface antigen    Standing Status:   Future    Standing Expiration Date:    02/27/2018  . Lactate dehydrogenase    Standing Status:   Future    Standing Expiration Date:   01/24/2018  . Uric acid    Standing Status:   Future    Standing Expiration Date:   01/24/2018   All questions were answered. The patient knows to call the clinic with any problems, questions or concerns. No barriers to learning was detected. I spent 30 minutes counseling the patient face to face. The total time spent in the appointment was 40 minutes and more than 50% was on counseling and review of test results     Heath Lark, MD 01/24/2017 3:23 PM

## 2017-01-24 NOTE — Assessment & Plan Note (Signed)
The patient is aware she has incurable disease and treatment is strictly palliative. We discussed importance of Advanced Directives and Living will. I will get assistance from our social worker to help her fill out some paperwork. We discussed CODE STATUS; the patient desires to remain in full code. 

## 2017-01-24 NOTE — Assessment & Plan Note (Addendum)
I have reviewed the current guidelines and we have discussed extensively in regards to her imaging study and treatment options. I do not feel strongly she needs a biopsy Her overall presentation is highly suspicious for recurrence of marginal zone lymphoma/lymphoplasmacytic lymphoma We discussed the risk and benefit of biopsy and she agreed to decline biopsy We have previously try to get her insurance prior authorization and payment assistance for ibrutinib Unfortunately, the cost is prohibitive She appears to tolerate prior treatment with bendamustine and rituximab well We discussed the risks, benefits, side effects of combination treatment of bendamustine and rituximab and she agreed to proceed. We discussed the role of chemotherapy is of palliative intent The decision was made based on publication in the Blood: Randomized trial of bendamustine-rituximab or R-CHOP/R-CVP in first-line treatment of indolent NHL or MCL: the BRIGHT study.  [place] [address], Lucianne Lei der [address] [place] BS, [address] M, Kwan YL, Simpson D, Craig M, Kolibaba K, Issa S, [place], [place] DM, Munteanu M, Aram Beecham JM SO  Blood. 2014;123(19):2944.  The chemotherapy consists of   1. Bendamustine at 90 mg/m2 on day 1 & 2 2. Rituximab at 375 mg/m2 on day 1 Each cycle + 28 days  Plan for 6 cycles total with or without Neulasta support  In the international phase III BRIGHT trial, 447 previously untreated patients with advanced stage follicular (n = 979 patients), mantle cell (n = 74 patients), or other indolent lymphoma were randomly assigned to six cycles of BR according to the same dose and schedule described above or to R-CHOP or R-CVP. BR resulted in similar complete (31 versus 25 percent) and overall (97 versus 91 percent) response rates. BR was associated with higher rates of vomiting and drug hypersensitivity and lower rates of peripheral neuropathy/paresthesia and alopecia. The use of  prophylactic antiemetics was not specified in the protocol and was more common among patients assigned to R-CHOP.   We discussed some of the risks, benefits and side-effects of Rituximab with Bendamustine.   Some of the short term side-effects included, though not limited to, risk of fatigue, weight loss, tumor lysis syndrome, risk of allergic reactions, pancytopenia, life-threatening infections, need for transfusions of blood products, nausea, vomiting, change in bowel habits, admission to hospital for various reasons, and risks of death.   Long term side-effects are also discussed including permanent damage to nerve function, chronic fatigue, and rare secondary malignancy including bone marrow disorders.   The patient is aware that the response rates discussed earlier is not guaranteed.    After a long discussion, patient made an informed decision to proceed with the prescribed plan of care.   Patient education material was dispensed Even though she is relatively asymptomatic, she is developing pancytopenia and serum IgM level is very high, raising the risk of thrombosis if we do not treat right now. Due to her age, I will plan to give her reduced dose bendamustine We discussed risk and benefits of port placement and she declined port placement I will schedule chemo education class and plan to start her treatment in about 2 weeks To reduce risk of infusion reaction, I will start her on steroid treatment for 3 days prior to stop date of treatment I would also recommend she takes allopurinol for tumor lysis prophylaxis I have also prescribed acyclovir for her to take as antimicrobial prophylaxis

## 2017-01-31 ENCOUNTER — Other Ambulatory Visit (HOSPITAL_BASED_OUTPATIENT_CLINIC_OR_DEPARTMENT_OTHER): Payer: Medicare Other

## 2017-01-31 ENCOUNTER — Encounter: Payer: Self-pay | Admitting: *Deleted

## 2017-01-31 ENCOUNTER — Other Ambulatory Visit: Payer: Medicare Other

## 2017-01-31 DIAGNOSIS — C8588 Other specified types of non-Hodgkin lymphoma, lymph nodes of multiple sites: Secondary | ICD-10-CM

## 2017-01-31 LAB — CBC WITH DIFFERENTIAL/PLATELET
BASO%: 0.6 % (ref 0.0–2.0)
Basophils Absolute: 0.1 10*3/uL (ref 0.0–0.1)
EOS%: 0.9 % (ref 0.0–7.0)
Eosinophils Absolute: 0.1 10*3/uL (ref 0.0–0.5)
HCT: 31.9 % — ABNORMAL LOW (ref 34.8–46.6)
HGB: 10.8 g/dL — ABNORMAL LOW (ref 11.6–15.9)
LYMPH%: 73.4 % — ABNORMAL HIGH (ref 14.0–49.7)
MCH: 33.7 pg (ref 25.1–34.0)
MCHC: 33.9 g/dL (ref 31.5–36.0)
MCV: 99.4 fL (ref 79.5–101.0)
MONO#: 0.4 10*3/uL (ref 0.1–0.9)
MONO%: 3.6 % (ref 0.0–14.0)
NEUT#: 2.6 10*3/uL (ref 1.5–6.5)
NEUT%: 21.5 % — ABNORMAL LOW (ref 38.4–76.8)
Platelets: 140 10*3/uL — ABNORMAL LOW (ref 145–400)
RBC: 3.21 10*6/uL — ABNORMAL LOW (ref 3.70–5.45)
RDW: 14.2 % (ref 11.2–14.5)
WBC: 12.1 10*3/uL — ABNORMAL HIGH (ref 3.9–10.3)
lymph#: 8.9 10*3/uL — ABNORMAL HIGH (ref 0.9–3.3)

## 2017-01-31 LAB — COMPREHENSIVE METABOLIC PANEL
ALT: 15 U/L (ref 0–55)
AST: 25 U/L (ref 5–34)
Albumin: 3.4 g/dL — ABNORMAL LOW (ref 3.5–5.0)
Alkaline Phosphatase: 52 U/L (ref 40–150)
Anion Gap: 10 mEq/L (ref 3–11)
BUN: 17.8 mg/dL (ref 7.0–26.0)
CO2: 29 mEq/L (ref 22–29)
Calcium: 9.7 mg/dL (ref 8.4–10.4)
Chloride: 104 mEq/L (ref 98–109)
Creatinine: 0.9 mg/dL (ref 0.6–1.1)
EGFR: 57 mL/min/{1.73_m2} — ABNORMAL LOW (ref >90)
Glucose: 103 mg/dl (ref 70–140)
Potassium: 4.1 mEq/L (ref 3.5–5.1)
Sodium: 143 mEq/L (ref 136–145)
Total Bilirubin: 0.64 mg/dL (ref 0.20–1.20)
Total Protein: 9.8 g/dL — ABNORMAL HIGH (ref 6.4–8.3)

## 2017-01-31 LAB — LACTATE DEHYDROGENASE: LDH: 382 U/L — ABNORMAL HIGH (ref 125–245)

## 2017-01-31 LAB — URIC ACID: Uric Acid, Serum: 4.7 mg/dl (ref 2.6–7.4)

## 2017-01-31 LAB — TECHNOLOGIST REVIEW

## 2017-02-01 ENCOUNTER — Encounter: Payer: Self-pay | Admitting: Pharmacist

## 2017-02-01 LAB — HEPATITIS B SURFACE ANTIBODY,QUALITATIVE: Hep B Surface Ab, Qual: NONREACTIVE

## 2017-02-01 LAB — HEPATITIS B SURFACE ANTIGEN: HBsAg Screen: NEGATIVE

## 2017-02-01 LAB — HEPATITIS B CORE ANTIBODY, IGM: Hep B Core Ab, IgM: NEGATIVE

## 2017-02-06 ENCOUNTER — Encounter: Payer: Self-pay | Admitting: Hematology and Oncology

## 2017-02-06 ENCOUNTER — Ambulatory Visit (HOSPITAL_BASED_OUTPATIENT_CLINIC_OR_DEPARTMENT_OTHER): Payer: Medicare Other

## 2017-02-06 ENCOUNTER — Ambulatory Visit (HOSPITAL_BASED_OUTPATIENT_CLINIC_OR_DEPARTMENT_OTHER): Payer: Medicare Other | Admitting: Hematology and Oncology

## 2017-02-06 VITALS — BP 127/57 | HR 75 | Temp 98.0°F | Resp 20

## 2017-02-06 DIAGNOSIS — D630 Anemia in neoplastic disease: Secondary | ICD-10-CM

## 2017-02-06 DIAGNOSIS — Z5112 Encounter for antineoplastic immunotherapy: Secondary | ICD-10-CM

## 2017-02-06 DIAGNOSIS — C8588 Other specified types of non-Hodgkin lymphoma, lymph nodes of multiple sites: Secondary | ICD-10-CM

## 2017-02-06 DIAGNOSIS — Z5111 Encounter for antineoplastic chemotherapy: Secondary | ICD-10-CM

## 2017-02-06 MED ORDER — RITUXIMAB CHEMO INJECTION 500 MG/50ML
375.0000 mg/m2 | Freq: Once | INTRAVENOUS | Status: AC
  Administered 2017-02-06: 600 mg via INTRAVENOUS
  Filled 2017-02-06: qty 50

## 2017-02-06 MED ORDER — DEXAMETHASONE SODIUM PHOSPHATE 10 MG/ML IJ SOLN
INTRAMUSCULAR | Status: AC
  Filled 2017-02-06: qty 1

## 2017-02-06 MED ORDER — SODIUM CHLORIDE 0.9 % IV SOLN
Freq: Once | INTRAVENOUS | Status: AC
  Administered 2017-02-06: 11:00:00 via INTRAVENOUS

## 2017-02-06 MED ORDER — DEXAMETHASONE SODIUM PHOSPHATE 10 MG/ML IJ SOLN
10.0000 mg | Freq: Once | INTRAMUSCULAR | Status: AC
  Administered 2017-02-06: 10 mg via INTRAVENOUS

## 2017-02-06 MED ORDER — DIPHENHYDRAMINE HCL 25 MG PO CAPS
50.0000 mg | ORAL_CAPSULE | Freq: Once | ORAL | Status: AC
  Administered 2017-02-06: 50 mg via ORAL

## 2017-02-06 MED ORDER — ACETAMINOPHEN 325 MG PO TABS
ORAL_TABLET | ORAL | Status: AC
  Filled 2017-02-06: qty 2

## 2017-02-06 MED ORDER — SODIUM CHLORIDE 0.9 % IV SOLN
65.0000 mg/m2 | Freq: Once | INTRAVENOUS | Status: AC
  Administered 2017-02-06: 100 mg via INTRAVENOUS
  Filled 2017-02-06: qty 20

## 2017-02-06 MED ORDER — PALONOSETRON HCL INJECTION 0.25 MG/5ML
INTRAVENOUS | Status: AC
  Filled 2017-02-06: qty 5

## 2017-02-06 MED ORDER — DIPHENHYDRAMINE HCL 25 MG PO CAPS
ORAL_CAPSULE | ORAL | Status: AC
  Filled 2017-02-06: qty 2

## 2017-02-06 MED ORDER — ACETAMINOPHEN 325 MG PO TABS
650.0000 mg | ORAL_TABLET | Freq: Once | ORAL | Status: AC
  Administered 2017-02-06: 650 mg via ORAL

## 2017-02-06 MED ORDER — PALONOSETRON HCL INJECTION 0.25 MG/5ML
0.2500 mg | Freq: Once | INTRAVENOUS | Status: AC
  Administered 2017-02-06: 0.25 mg via INTRAVENOUS

## 2017-02-06 NOTE — Assessment & Plan Note (Signed)
She tolerated premedication the last few days with steroids. She is taking prophylactic antiviral treatment with acyclovir and allopurinol for tumor lysis prophylaxis We will proceed with treatment as scheduled I recommend she delay her dental work at least until 3 weeks from now I will see her back again prior to cycle 2 of treatment

## 2017-02-06 NOTE — Progress Notes (Signed)
[place] OFFICE PROGRESS NOTE  Patient Care Team: Hilbert Bible as PCP - General (Physician Assistant)  SUMMARY OF ONCOLOGIC HISTORY:   Lymphoma, marginal zone, lymph nodes of multiple sites [facility])   10/26/2011 Initial Diagnosis    The patient was diagnosed with marginal zone lymphoma/lymphoplasmacytic lymphoma in [place].  She received single agent rituximab,  weekly 4, completed by May 2013.  She was subsequently found to have disease relapse, and was treated with single agent bendamustine from January 2014 to June 2014, 70 mg/m in [place]      05/12/2013 Imaging    Outside CT showed no evidence of disease      11/18/2014 Imaging    Outside CT scan show no evidence of disease      11/03/2015 Pathology Results    Peripheral Blood Flow Cytometry - MONOCLONAL B CELL POPULATION IDENTIFIED. - SEE COMMENT. Diagnosis Comment: The majority of lymphocytes consist of a monoclonal population of B cells expressing pan B cell antigens including CD20 with associated expression of CD5, CD25, CD11c, and FMC7. No CD10 or CD103 expression is identified. The peripheral blood shows a population of atypical lymphocytes ranging from small to large cells with scanty to moderately abundant cytoplasm, dense to partially clumped chromatin and small to inconspicuous nucleoli. The findings are consistent with a B-cell lymphoproliferative process. Consideration should be given to chronic lymphocytic leukemia (variant or transformation), mantle cell lymphoma and other B cell lymphomas with rare CD5 expression. FISH studies may be helpful in this regard      01/04/2017 PET scan    1. Mildly enlarged retroperitoneal lymph nodes are present including a 1.4 cm node anterior to the aortic bifurcation which demonstrates low-grade metabolic activity with maximum SUV 2.5. This would be considered Deauville 3 activity. 2. Splenomegaly with splenic volume of 540 cc, but no focal splenic lesions  seen. 3. Other imaging findings of potential clinical significance: Considerable atherosclerosis. Bilateral nonobstructive nephrolithiasis. Extensive sigmoid colon diverticulosis. Pelvic floor laxity. Right shoulder prosthesis with activity along the joint margins likely inflammatory. Old pelvic fractures with sclerotic late phase healing response.       INTERVAL HISTORY: Please see below for problem oriented charting. She is seen in the treatment room prior to cycle 1 of treatment She tolerated premedication well Denies new symptoms or infection since the last time I saw her.  REVIEW OF SYSTEMS:   Constitutional: Denies fevers, chills or abnormal weight loss Eyes: Denies blurriness of vision Ears, nose, mouth, throat, and face: Denies mucositis or sore throat Respiratory: Denies cough, dyspnea or wheezes Cardiovascular: Denies palpitation, chest discomfort or lower extremity swelling Gastrointestinal:  Denies nausea, heartburn or change in bowel habits Skin: Denies abnormal skin rashes Lymphatics: Denies new lymphadenopathy or easy bruising Neurological:Denies numbness, tingling or new weaknesses Behavioral/Psych: Mood is stable, no new changes  All other systems were reviewed with the patient and are negative.  I have reviewed the past medical history, past surgical history, social history and family history with the patient and they are unchanged from previous note.  ALLERGIES:  is allergic to penicillins; tegretol [carbamazepine]; and lyrica [pregabalin].  MEDICATIONS:  Current Outpatient Prescriptions  Medication Sig Dispense Refill  . acyclovir (ZOVIRAX) 400 MG tablet Take 1 tablet (400 mg total) by mouth daily. 30 tablet 3  . alendronate (FOSAMAX) 70 MG tablet Take 70 mg by mouth once a week.  2  . allopurinol (ZYLOPRIM) 300 MG tablet Take 1 tablet (300 mg total) by mouth daily. Luis M. Cintron  tablet 3  . aspirin 81 MG tablet Take 81 mg by mouth daily.    . calcium-vitamin D (OSCAL  WITH D) 500-200 MG-UNIT tablet Take 1 tablet by mouth daily.     Marland Kitchen dexamethasone (DECADRON) 4 MG tablet Take 2 tablets (8 mg total) by mouth daily. 6 tablet 0  . fluticasone (FLONASE) 50 MCG/ACT nasal spray     . gabapentin (NEURONTIN) 300 MG capsule Take 300 mg by mouth 3 (three) times daily as needed.    . Ibrutinib 280 MG TABS Take 280 mg by mouth daily. (Patient not taking: Reported on 01/23/2017) 30 tablet 11  . levothyroxine (SYNTHROID, LEVOTHROID) 75 MCG tablet Take 75 mcg by mouth daily. Six days per week  1  . Omega-3 Fatty Acids (FISH OIL PO) Take by mouth 2 (two) times daily.    . ondansetron (ZOFRAN) 8 MG tablet Take 1 tablet (8 mg total) by mouth every 8 (eight) hours as needed for refractory nausea / vomiting. 30 tablet 1  . prochlorperazine (COMPAZINE) 10 MG tablet Take 1 tablet (10 mg total) by mouth every 6 (six) hours as needed (Nausea or vomiting). 30 tablet 1  . propranolol (INDERAL) 10 MG tablet Take 10 mg by mouth daily.     No current facility-administered medications for this visit.     PHYSICAL EXAMINATION: ECOG PERFORMANCE STATUS: 0 - Asymptomatic GENERAL:alert, no distress and comfortable SKIN: skin color, texture, turgor are normal, no rashes or significant lesions EYES: normal, Conjunctiva are pink and non-injected, sclera clear OROPHARYNX:no exudate, no erythema and lips, buccal mucosa, and tongue normal  NECK: supple, thyroid normal size, non-tender, without nodularity LYMPH:  no palpable lymphadenopathy in the cervical, axillary or inguinal LUNGS: clear to auscultation and percussion with normal breathing effort HEART: regular rate & rhythm and no murmurs and no lower extremity edema ABDOMEN:abdomen soft, non-tender and normal bowel sounds Musculoskeletal:no cyanosis of digits and no clubbing  NEURO: alert & oriented x 3 with fluent speech, no focal motor/sensory deficits  LABORATORY DATA:  I have reviewed the data as listed    Component Value Date/Time    NA 143 01/31/2017 0926   K 4.1 01/31/2017 0926   CO2 29 01/31/2017 0926   GLUCOSE 103 01/31/2017 0926   BUN 17.8 01/31/2017 0926   CREATININE 0.9 01/31/2017 0926   CALCIUM 9.7 01/31/2017 0926   PROT 9.8 (H) 01/31/2017 0926   ALBUMIN 3.4 (L) 01/31/2017 0926   AST 25 01/31/2017 0926   ALT 15 01/31/2017 0926   ALKPHOS 52 01/31/2017 0926   BILITOT 0.64 01/31/2017 0926    No results found for: SPEP, UPEP  Lab Results  Component Value Date   WBC 12.1 (H) 01/31/2017   NEUTROABS 2.6 01/31/2017   HGB 10.8 (L) 01/31/2017   HCT 31.9 (L) 01/31/2017   MCV 99.4 01/31/2017   PLT 140 (L) 01/31/2017      Chemistry      Component Value Date/Time   NA 143 01/31/2017 0926   K 4.1 01/31/2017 0926   CO2 29 01/31/2017 0926   BUN 17.8 01/31/2017 0926   CREATININE 0.9 01/31/2017 0926      Component Value Date/Time   CALCIUM 9.7 01/31/2017 0926   ALKPHOS 52 01/31/2017 0926   AST 25 01/31/2017 0926   ALT 15 01/31/2017 0926   BILITOT 0.64 01/31/2017 0926       ASSESSMENT & PLAN:  Lymphoma, marginal zone, lymph nodes of multiple sites ([place]) She tolerated premedication the last few days  with steroids. She is taking prophylactic antiviral treatment with acyclovir and allopurinol for tumor lysis prophylaxis We will proceed with treatment as scheduled I recommend she delay her dental work at least until 3 weeks from now I will see her back again prior to cycle 2 of treatment  Anemia in neoplastic disease This is likely anemia of chronic disease. The patient denies recent history of bleeding such as epistaxis, hematuria or hematochezia. She is asymptomatic from the anemia. We will observe for now.  She does not require transfusion now.     No orders of the defined types were placed in this encounter.  All questions were answered. The patient knows to call the clinic with any problems, questions or concerns. No barriers to learning was detected. I spent 15 minutes counseling the patient  face to face. The total time spent in the appointment was 20 minutes and more than 50% was on counseling and review of test results     Heath Lark, MD 02/06/2017 10:34 AM

## 2017-02-06 NOTE — Patient Instructions (Signed)
[facility] [place] Discharge Instructions for Patients Receiving Chemotherapy  Today you received the following chemotherapy agents Rituxan and treanda.  To help prevent nausea and vomiting after your treatment, we encourage you to take your nausea medication  Compazine and zofran.   Take compazine as needed Wed. Thursday and Friday.  The compazine may make you sleepy so do not drive while taking it.   On Saturday, you may start using zofran for nausea as needed and then use compazine if the zofran does not work.   If you develop nausea and vomiting that is not controlled by your nausea medication, call the clinic.   BELOW ARE SYMPTOMS THAT SHOULD BE REPORTED IMMEDIATELY:  *FEVER GREATER THAN 100.5 F  *CHILLS WITH OR WITHOUT FEVER  NAUSEA AND VOMITING THAT IS NOT CONTROLLED WITH YOUR NAUSEA MEDICATION  *UNUSUAL SHORTNESS OF BREATH  *UNUSUAL BRUISING OR BLEEDING  TENDERNESS IN MOUTH AND THROAT WITH OR WITHOUT PRESENCE OF ULCERS  *URINARY PROBLEMS  *BOWEL PROBLEMS  UNUSUAL RASH Items with * indicate a potential emergency and should be followed up as soon as possible.  Feel free to call the clinic you have any questions or concerns. The clinic phone number is [phone].  Please show the [place] at check-in to the Emergency Department and triage nurse.

## 2017-02-06 NOTE — Assessment & Plan Note (Signed)
This is likely anemia of chronic disease. The patient denies recent history of bleeding such as epistaxis, hematuria or hematochezia. She is asymptomatic from the anemia. We will observe for now.  She does not require transfusion now.   

## 2017-02-07 ENCOUNTER — Ambulatory Visit (HOSPITAL_BASED_OUTPATIENT_CLINIC_OR_DEPARTMENT_OTHER): Payer: Medicare Other

## 2017-02-07 VITALS — BP 115/62 | HR 78 | Temp 98.0°F | Resp 17

## 2017-02-07 DIAGNOSIS — Z5112 Encounter for antineoplastic immunotherapy: Secondary | ICD-10-CM

## 2017-02-07 DIAGNOSIS — C8588 Other specified types of non-Hodgkin lymphoma, lymph nodes of multiple sites: Secondary | ICD-10-CM

## 2017-02-07 MED ORDER — SODIUM CHLORIDE 0.9 % IV SOLN
Freq: Once | INTRAVENOUS | Status: AC
  Administered 2017-02-07: 13:00:00 via INTRAVENOUS

## 2017-02-07 MED ORDER — DEXAMETHASONE SODIUM PHOSPHATE 10 MG/ML IJ SOLN
INTRAMUSCULAR | Status: AC
  Filled 2017-02-07: qty 1

## 2017-02-07 MED ORDER — BENDAMUSTINE HCL (LYOPHILIZED PWD) CHEMO INJECTION 100MG
65.0000 mg/m2 | Freq: Once | INTRAVENOUS | Status: AC
  Administered 2017-02-07: 100 mg via INTRAVENOUS
  Filled 2017-02-07: qty 20

## 2017-02-07 MED ORDER — DEXAMETHASONE SODIUM PHOSPHATE 10 MG/ML IJ SOLN: 10.0000 mg | Freq: Once | INTRAMUSCULAR | Status: DC

## 2017-02-07 MED ORDER — SODIUM CHLORIDE 0.9 % IV SOLN
10.0000 mg | Freq: Once | INTRAVENOUS | Status: AC
  Administered 2017-02-07: 10 mg via INTRAVENOUS
  Filled 2017-02-07: qty 1

## 2017-02-07 NOTE — Patient Instructions (Addendum)
[facility] Discharge Instructions for Patients Receiving Chemotherapy  Today you received the following chemotherapy agents: bendamustine  To help prevent nausea and vomiting after your treatment, we encourage you to take your nausea medication as prescribed by your physician.   If you develop nausea and vomiting that is not controlled by your nausea medication, call the clinic.   For your constipation, take Senokot twice a day starting 02/07/17; if no results, take Miralax 1 dose on 02/08/17.  BELOW ARE SYMPTOMS THAT SHOULD BE REPORTED IMMEDIATELY:  *FEVER GREATER THAN 100.5 F  *CHILLS WITH OR WITHOUT FEVER  NAUSEA AND VOMITING THAT IS NOT CONTROLLED WITH YOUR NAUSEA MEDICATION  *UNUSUAL SHORTNESS OF BREATH  *UNUSUAL BRUISING OR BLEEDING  TENDERNESS IN MOUTH AND THROAT WITH OR WITHOUT PRESENCE OF ULCERS  *URINARY PROBLEMS  *BOWEL PROBLEMS  UNUSUAL RASH Items with * indicate a potential emergency and should be followed up as soon as possible.  Feel free to call the clinic you have any questions or concerns. The clinic phone number is [phone].  Please show the [place] at check-in to the Emergency Department and triage nurse.

## 2017-02-08 ENCOUNTER — Telehealth: Payer: Self-pay

## 2017-02-08 NOTE — Telephone Encounter (Signed)
Called patient to see how she was doing coming in for 2 days. She said she was good, except she is constipated and was getting ready to take some miralax. She said shw was tired but doing okay.

## 2017-02-08 NOTE — Telephone Encounter (Signed)
-----   Message from Ignacia Felling, RN sent at 02/06/2017 12:23 PM EDT ----- Regarding: Dr. Cletus Gash  chemo follow up call 1st Rituxan/treanda  Dr. Cletus Gash   Patient phone [phone]

## 2017-02-11 ENCOUNTER — Telehealth: Payer: Self-pay

## 2017-02-11 NOTE — Telephone Encounter (Signed)
Called and left message to see how patient was doing after she called the after hours number with constipation.

## 2017-02-11 NOTE — Telephone Encounter (Signed)
I can see her tomorrow  Come in for labs at 10, see me 1030 am If it is OK, let me know and I can put scheduling msg

## 2017-02-11 NOTE — Telephone Encounter (Signed)
Called patient back, she will be here tomorrow at 10:00 am for labs and to see Dr. Alvy Bimler.

## 2017-02-11 NOTE — Telephone Encounter (Signed)
Called patient back, she left a message to call her. Patient states that constipation is better. She had x 2 bowel movements yesterday, stool is hard, she is not taking anything for constipation now. Patient states that she is having low energy, sleeping alot and feels shaky with tremors per patient. She is asking if Dr. Alvy Bimler has any appts for tomorrow.

## 2017-02-12 ENCOUNTER — Encounter: Payer: Self-pay | Admitting: Hematology and Oncology

## 2017-02-12 ENCOUNTER — Ambulatory Visit (HOSPITAL_BASED_OUTPATIENT_CLINIC_OR_DEPARTMENT_OTHER): Payer: Medicare Other | Admitting: Hematology and Oncology

## 2017-02-12 ENCOUNTER — Ambulatory Visit (HOSPITAL_BASED_OUTPATIENT_CLINIC_OR_DEPARTMENT_OTHER): Payer: Medicare Other

## 2017-02-12 DIAGNOSIS — C8588 Other specified types of non-Hodgkin lymphoma, lymph nodes of multiple sites: Secondary | ICD-10-CM | POA: Diagnosis not present

## 2017-02-12 DIAGNOSIS — K5909 Other constipation: Secondary | ICD-10-CM

## 2017-02-12 DIAGNOSIS — D61818 Other pancytopenia: Secondary | ICD-10-CM

## 2017-02-12 LAB — COMPREHENSIVE METABOLIC PANEL
ALT: 13 U/L (ref 0–55)
AST: 17 U/L (ref 5–34)
Albumin: 2.8 g/dL — ABNORMAL LOW (ref 3.5–5.0)
Alkaline Phosphatase: 44 U/L (ref 40–150)
Anion Gap: 10 mEq/L (ref 3–11)
BUN: 18.1 mg/dL (ref 7.0–26.0)
CO2: 26 mEq/L (ref 22–29)
Calcium: 9.8 mg/dL (ref 8.4–10.4)
Chloride: 103 mEq/L (ref 98–109)
Creatinine: 0.8 mg/dL (ref 0.6–1.1)
EGFR: 65 mL/min/{1.73_m2} — ABNORMAL LOW (ref >90)
Glucose: 139 mg/dl (ref 70–140)
Potassium: 3.8 mEq/L (ref 3.5–5.1)
Sodium: 139 mEq/L (ref 136–145)
Total Bilirubin: 1.28 mg/dL — ABNORMAL HIGH (ref 0.20–1.20)
Total Protein: 8 g/dL (ref 6.4–8.3)

## 2017-02-12 LAB — CBC WITH DIFFERENTIAL/PLATELET
BASO%: 0.4 % (ref 0.0–2.0)
Basophils Absolute: 0 10*3/uL (ref 0.0–0.1)
EOS%: 1.8 % (ref 0.0–7.0)
Eosinophils Absolute: 0.1 10*3/uL (ref 0.0–0.5)
HCT: 30.3 % — ABNORMAL LOW (ref 34.8–46.6)
HGB: 10.4 g/dL — ABNORMAL LOW (ref 11.6–15.9)
LYMPH%: 3.6 % — ABNORMAL LOW (ref 14.0–49.7)
MCH: 33.2 pg (ref 25.1–34.0)
MCHC: 34.4 g/dL (ref 31.5–36.0)
MCV: 96.5 fL (ref 79.5–101.0)
MONO#: 0.3 10*3/uL (ref 0.1–0.9)
MONO%: 7.6 % (ref 0.0–14.0)
NEUT#: 3.3 10*3/uL (ref 1.5–6.5)
NEUT%: 86.6 % — ABNORMAL HIGH (ref 38.4–76.8)
Platelets: 87 10*3/uL — ABNORMAL LOW (ref 145–400)
RBC: 3.14 10*6/uL — ABNORMAL LOW (ref 3.70–5.45)
RDW: 13.8 % (ref 11.2–14.5)
WBC: 3.8 10*3/uL — ABNORMAL LOW (ref 3.9–10.3)
lymph#: 0.1 10*3/uL — ABNORMAL LOW (ref 0.9–3.3)

## 2017-02-12 NOTE — Assessment & Plan Note (Signed)
She has severe constipation due to recent treatment. It has resolved with aggressive laxative treatment. I recommend future schedule laxative after each treatment to prevent severe constipation again.

## 2017-02-12 NOTE — Assessment & Plan Note (Signed)
She tolerated cycle 1 poorly, complicated by excessive fatigue and severe constipation. Her energy level is improving and constipation has resolved. Continue supportive care. I will see her back prior to cycle 2. If she remained unwell, I might consider dose adjustment

## 2017-02-12 NOTE — Progress Notes (Signed)
[place] OFFICE PROGRESS NOTE  Patient Care Team: Hilbert Bible as PCP - General (Physician Assistant)  SUMMARY OF ONCOLOGIC HISTORY:   Lymphoma, marginal zone, lymph nodes of multiple sites [facility])   10/26/2011 Initial Diagnosis    The patient was diagnosed with marginal zone lymphoma/lymphoplasmacytic lymphoma in [place].  She received single agent rituximab,  weekly 4, completed by May 2013.  She was subsequently found to have disease relapse, and was treated with single agent bendamustine from January 2014 to June 2014, 70 mg/m in [place]      05/12/2013 Imaging    Outside CT showed no evidence of disease      11/18/2014 Imaging    Outside CT scan show no evidence of disease      11/03/2015 Pathology Results    Peripheral Blood Flow Cytometry - MONOCLONAL B CELL POPULATION IDENTIFIED. - SEE COMMENT. Diagnosis Comment: The majority of lymphocytes consist of a monoclonal population of B cells expressing pan B cell antigens including CD20 with associated expression of CD5, CD25, CD11c, and FMC7. No CD10 or CD103 expression is identified. The peripheral blood shows a population of atypical lymphocytes ranging from small to large cells with scanty to moderately abundant cytoplasm, dense to partially clumped chromatin and small to inconspicuous nucleoli. The findings are consistent with a B-cell lymphoproliferative process. Consideration should be given to chronic lymphocytic leukemia (variant or transformation), mantle cell lymphoma and other B cell lymphomas with rare CD5 expression. FISH studies may be helpful in this regard      01/04/2017 PET scan    1. Mildly enlarged retroperitoneal lymph nodes are present including a 1.4 cm node anterior to the aortic bifurcation which demonstrates low-grade metabolic activity with maximum SUV 2.5. This would be considered Deauville 3 activity. 2. Splenomegaly with splenic volume of 540 cc, but no focal splenic lesions  seen. 3. Other imaging findings of potential clinical significance: Considerable atherosclerosis. Bilateral nonobstructive nephrolithiasis. Extensive sigmoid colon diverticulosis. Pelvic floor laxity. Right shoulder prosthesis with activity along the joint margins likely inflammatory. Old pelvic fractures with sclerotic late phase healing response.       INTERVAL HISTORY: Please see below for problem oriented charting. She is seen per patient request because she has not been feeling well She had severe constipation last week after treatment, subsequently resolved with aggressive laxative therapy She denies nausea She complain of fatigue. The patient denies any recent signs or symptoms of bleeding such as spontaneous epistaxis, hematuria or hematochezia.   REVIEW OF SYSTEMS:   Constitutional: Denies fevers, chills or abnormal weight loss Eyes: Denies blurriness of vision Ears, nose, mouth, throat, and face: Denies mucositis or sore throat Respiratory: Denies cough, dyspnea or wheezes Cardiovascular: Denies palpitation, chest discomfort or lower extremity swelling Skin: Denies abnormal skin rashes Lymphatics: Denies new lymphadenopathy or easy bruising Neurological:Denies numbness, tingling or new weaknesses Behavioral/Psych: Mood is stable, no new changes  All other systems were reviewed with the patient and are negative.  I have reviewed the past medical history, past surgical history, social history and family history with the patient and they are unchanged from previous note.  ALLERGIES:  is allergic to penicillins; tegretol [carbamazepine]; and lyrica [pregabalin].  MEDICATIONS:  Current Outpatient Prescriptions  Medication Sig Dispense Refill  . acyclovir (ZOVIRAX) 400 MG tablet Take 1 tablet (400 mg total) by mouth daily. 30 tablet 3  . alendronate (FOSAMAX) 70 MG tablet Take 70 mg by mouth once a week.  2  . allopurinol (ZYLOPRIM)  300 MG tablet Take 1 tablet (300 mg total)  by mouth daily. 30 tablet 3  . aspirin 81 MG tablet Take 81 mg by mouth daily.    . calcium-vitamin D (OSCAL WITH D) 500-200 MG-UNIT tablet Take 1 tablet by mouth daily.     . fluticasone (FLONASE) 50 MCG/ACT nasal spray     . gabapentin (NEURONTIN) 300 MG capsule Take 300 mg by mouth 3 (three) times daily as needed.    Marland Kitchen levothyroxine (SYNTHROID, LEVOTHROID) 75 MCG tablet Take 75 mcg by mouth daily. Six days per week  1  . Omega-3 Fatty Acids (FISH OIL PO) Take by mouth 2 (two) times daily.    . propranolol (INDERAL) 10 MG tablet Take 10 mg by mouth daily.    . Ibrutinib 280 MG TABS Take 280 mg by mouth daily. (Patient not taking: Reported on 01/23/2017) 30 tablet 11  . ondansetron (ZOFRAN) 8 MG tablet Take 1 tablet (8 mg total) by mouth every 8 (eight) hours as needed for refractory nausea / vomiting. (Patient not taking: Reported on 02/12/2017) 30 tablet 1  . prochlorperazine (COMPAZINE) 10 MG tablet Take 1 tablet (10 mg total) by mouth every 6 (six) hours as needed (Nausea or vomiting). (Patient not taking: Reported on 02/12/2017) 30 tablet 1   No current facility-administered medications for this visit.     PHYSICAL EXAMINATION: ECOG PERFORMANCE STATUS: 1 - Symptomatic but completely ambulatory  Vitals:   02/12/17 1034  BP: (!) 138/53  Pulse: 93  Resp: 18  Temp: 98 F (36.7 C)   Filed Weights   02/12/17 1034  Weight: 125 lb 14.4 oz (57.1 kg)    GENERAL:alert, no distress and comfortable SKIN: skin color, texture, turgor are normal, no rashes or significant lesions EYES: normal, Conjunctiva are pink and non-injected, sclera clear OROPHARYNX:no exudate, no erythema and lips, buccal mucosa, and tongue normal  NECK: supple, thyroid normal size, non-tender, without nodularity LYMPH:  no palpable lymphadenopathy in the cervical, axillary or inguinal LUNGS: clear to auscultation and percussion with normal breathing effort HEART: regular rate & rhythm and no murmurs and no lower  extremity edema ABDOMEN:abdomen soft, non-tender and normal bowel sounds Musculoskeletal:no cyanosis of digits and no clubbing  NEURO: alert & oriented x 3 with fluent speech, no focal motor/sensory deficits  LABORATORY DATA:  I have reviewed the data as listed    Component Value Date/Time   NA 139 02/12/2017 1004   K 3.8 02/12/2017 1004   CO2 26 02/12/2017 1004   GLUCOSE 139 02/12/2017 1004   BUN 18.1 02/12/2017 1004   CREATININE 0.8 02/12/2017 1004   CALCIUM 9.8 02/12/2017 1004   PROT 8.0 02/12/2017 1004   ALBUMIN 2.8 (L) 02/12/2017 1004   AST 17 02/12/2017 1004   ALT 13 02/12/2017 1004   ALKPHOS 44 02/12/2017 1004   BILITOT 1.28 (H) 02/12/2017 1004    No results found for: SPEP, UPEP  Lab Results  Component Value Date   WBC 3.8 (L) 02/12/2017   NEUTROABS 3.3 02/12/2017   HGB 10.4 (L) 02/12/2017   HCT 30.3 (L) 02/12/2017   MCV 96.5 02/12/2017   PLT 87 (L) 02/12/2017      Chemistry      Component Value Date/Time   NA 139 02/12/2017 1004   K 3.8 02/12/2017 1004   CO2 26 02/12/2017 1004   BUN 18.1 02/12/2017 1004   CREATININE 0.8 02/12/2017 1004      Component Value Date/Time   CALCIUM 9.8 02/12/2017 1004  ALKPHOS 44 02/12/2017 1004   AST 17 02/12/2017 1004   ALT 13 02/12/2017 1004   BILITOT 1.28 (H) 02/12/2017 1004       ASSESSMENT & PLAN:  Lymphoma, marginal zone, lymph nodes of multiple sites [facility]) She tolerated cycle 1 poorly, complicated by excessive fatigue and severe constipation. Her energy level is improving and constipation has resolved. Continue supportive care. I will see her back prior to cycle 2. If she remained unwell, I might consider dose adjustment  Pancytopenia, acquired ([place]) She has mild acquired pancytopenia due to recent treatment She is not symptomatic Recommend close observation only  Other constipation She has severe constipation due to recent treatment. It has resolved with aggressive laxative treatment. I recommend  future schedule laxative after each treatment to prevent severe constipation again.   No orders of the defined types were placed in this encounter.  All questions were answered. The patient knows to call the clinic with any problems, questions or concerns. No barriers to learning was detected. I spent 15 minutes counseling the patient face to face. The total time spent in the appointment was 20 minutes and more than 50% was on counseling and review of test results     Heath Lark, MD 02/12/2017 5:17 PM

## 2017-02-12 NOTE — Assessment & Plan Note (Signed)
She has mild acquired pancytopenia due to recent treatment She is not symptomatic Recommend close observation only

## 2017-02-13 ENCOUNTER — Other Ambulatory Visit: Payer: Self-pay | Admitting: Physician Assistant

## 2017-02-13 DIAGNOSIS — Z1231 Encounter for screening mammogram for malignant neoplasm of breast: Secondary | ICD-10-CM

## 2017-02-17 DIAGNOSIS — M791 Myalgia: Secondary | ICD-10-CM | POA: Diagnosis not present

## 2017-02-17 DIAGNOSIS — R110 Nausea: Secondary | ICD-10-CM | POA: Diagnosis not present

## 2017-02-17 DIAGNOSIS — J069 Acute upper respiratory infection, unspecified: Secondary | ICD-10-CM | POA: Diagnosis not present

## 2017-02-17 DIAGNOSIS — R000 Tachycardia, unspecified: Secondary | ICD-10-CM | POA: Diagnosis not present

## 2017-02-18 ENCOUNTER — Encounter (HOSPITAL_COMMUNITY): Payer: Self-pay | Admitting: Emergency Medicine

## 2017-02-18 ENCOUNTER — Emergency Department (HOSPITAL_COMMUNITY): Payer: Medicare Other

## 2017-02-18 ENCOUNTER — Other Ambulatory Visit: Payer: Self-pay

## 2017-02-18 ENCOUNTER — Emergency Department (HOSPITAL_COMMUNITY)
Admission: EM | Admit: 2017-02-18 | Discharge: 2017-02-18 | Disposition: A | Discharge status: Home / Self Care | Payer: Medicare Other | Attending: Emergency Medicine | Admitting: Emergency Medicine

## 2017-02-18 DIAGNOSIS — Z79899 Other long term (current) drug therapy: Secondary | ICD-10-CM | POA: Diagnosis not present

## 2017-02-18 DIAGNOSIS — C8590 Non-Hodgkin lymphoma, unspecified, unspecified site: Secondary | ICD-10-CM | POA: Insufficient documentation

## 2017-02-18 DIAGNOSIS — N390 Urinary tract infection, site not specified: Secondary | ICD-10-CM

## 2017-02-18 DIAGNOSIS — J9811 Atelectasis: Secondary | ICD-10-CM | POA: Diagnosis not present

## 2017-02-18 DIAGNOSIS — R7989 Other specified abnormal findings of blood chemistry: Secondary | ICD-10-CM | POA: Diagnosis not present

## 2017-02-18 DIAGNOSIS — R748 Abnormal levels of other serum enzymes: Secondary | ICD-10-CM | POA: Insufficient documentation

## 2017-02-18 DIAGNOSIS — D259 Leiomyoma of uterus, unspecified: Secondary | ICD-10-CM | POA: Diagnosis not present

## 2017-02-18 DIAGNOSIS — R0600 Dyspnea, unspecified: Secondary | ICD-10-CM | POA: Diagnosis not present

## 2017-02-18 DIAGNOSIS — R404 Transient alteration of awareness: Secondary | ICD-10-CM | POA: Diagnosis not present

## 2017-02-18 DIAGNOSIS — J90 Pleural effusion, not elsewhere classified: Secondary | ICD-10-CM | POA: Diagnosis not present

## 2017-02-18 DIAGNOSIS — Z7982 Long term (current) use of aspirin: Secondary | ICD-10-CM | POA: Insufficient documentation

## 2017-02-18 DIAGNOSIS — R531 Weakness: Secondary | ICD-10-CM | POA: Diagnosis present

## 2017-02-18 DIAGNOSIS — Z87891 Personal history of nicotine dependence: Secondary | ICD-10-CM | POA: Diagnosis not present

## 2017-02-18 DIAGNOSIS — R0602 Shortness of breath: Secondary | ICD-10-CM

## 2017-02-18 LAB — CBC WITH DIFFERENTIAL/PLATELET
Basophils Absolute: 0 10*3/uL (ref 0.0–0.1)
Basophils Relative: 0 %
Eosinophils Absolute: 0 10*3/uL (ref 0.0–0.7)
Eosinophils Relative: 0 %
HCT: 29.7 % — ABNORMAL LOW (ref 36.0–46.0)
Hemoglobin: 10.2 g/dL — ABNORMAL LOW (ref 12.0–15.0)
Lymphocytes Relative: 14 %
Lymphs Abs: 1.6 10*3/uL (ref 0.7–4.0)
MCH: 33.7 pg (ref 26.0–34.0)
MCHC: 34.3 g/dL (ref 30.0–36.0)
MCV: 98 fL (ref 78.0–100.0)
Monocytes Absolute: 0.1 10*3/uL (ref 0.1–1.0)
Monocytes Relative: 1 %
Neutro Abs: 10 10*3/uL — ABNORMAL HIGH (ref 1.7–7.7)
Neutrophils Relative %: 85 %
Platelets: 182 10*3/uL (ref 150–400)
RBC: 3.03 MIL/uL — ABNORMAL LOW (ref 3.87–5.11)
RDW: 15.6 % — ABNORMAL HIGH (ref 11.5–15.5)
WBC: 11.7 10*3/uL — ABNORMAL HIGH (ref 4.0–10.5)

## 2017-02-18 LAB — URINALYSIS, ROUTINE W REFLEX MICROSCOPIC
Bilirubin Urine: NEGATIVE
Glucose, UA: NEGATIVE mg/dL
Ketones, ur: 5 mg/dL — AB
Nitrite: NEGATIVE
Protein, ur: 30 mg/dL — AB
Specific Gravity, Urine: 1.018 (ref 1.005–1.030)
pH: 5 (ref 5.0–8.0)

## 2017-02-18 LAB — COMPREHENSIVE METABOLIC PANEL
ALT: 16 U/L (ref 14–54)
AST: 32 U/L (ref 15–41)
Albumin: 2.4 g/dL — ABNORMAL LOW (ref 3.5–5.0)
Alkaline Phosphatase: 37 U/L — ABNORMAL LOW (ref 38–126)
Anion gap: 11 (ref 5–15)
BUN: 32 mg/dL — ABNORMAL HIGH (ref 6–20)
CO2: 24 mmol/L (ref 22–32)
Calcium: 8.3 mg/dL — ABNORMAL LOW (ref 8.9–10.3)
Chloride: 102 mmol/L (ref 101–111)
Creatinine, Ser: 0.83 mg/dL (ref 0.44–1.00)
GFR calc Af Amer: >60 mL/min (ref >60)
GFR calc non Af Amer: >60 mL/min (ref >60)
Glucose, Bld: 109 mg/dL — ABNORMAL HIGH (ref 65–99)
Potassium: 3.7 mmol/L (ref 3.5–5.1)
Sodium: 137 mmol/L (ref 135–145)
Total Bilirubin: 1.7 mg/dL — ABNORMAL HIGH (ref 0.3–1.2)
Total Protein: 8.6 g/dL — ABNORMAL HIGH (ref 6.5–8.1)

## 2017-02-18 LAB — BRAIN NATRIURETIC PEPTIDE: B Natriuretic Peptide: 175.9 pg/mL — ABNORMAL HIGH (ref 0.0–100.0)

## 2017-02-18 LAB — PATHOLOGIST SMEAR REVIEW

## 2017-02-18 LAB — TROPONIN I: Troponin I: 0.04 ng/mL (ref <0.03)

## 2017-02-18 LAB — PROTIME-INR
INR: 1.21
Prothrombin Time: 15.4 seconds — ABNORMAL HIGH (ref 11.4–15.2)

## 2017-02-18 LAB — D-DIMER, QUANTITATIVE: D-Dimer, Quant: 1.55 ug/mL-FEU — ABNORMAL HIGH (ref 0.00–0.50)

## 2017-02-18 LAB — LIPASE, BLOOD: Lipase: 42 U/L (ref 11–51)

## 2017-02-18 LAB — I-STAT CG4 LACTIC ACID, ED: Lactic Acid, Venous: 1.48 mmol/L (ref 0.5–1.9)

## 2017-02-18 MED ORDER — CEPHALEXIN 500 MG PO CAPS: 500.0000 mg | ORAL_CAPSULE | Freq: Three times a day (TID) | ORAL | 0 refills | Status: AC

## 2017-02-18 MED ORDER — ACETAMINOPHEN 325 MG PO TABS
650.0000 mg | ORAL_TABLET | Freq: Once | ORAL | Status: AC
  Administered 2017-02-18: 650 mg via ORAL
  Filled 2017-02-18: qty 2

## 2017-02-18 MED ORDER — SODIUM CHLORIDE 0.9 % IV BOLUS (SEPSIS)
1000.0000 mL | Freq: Once | INTRAVENOUS | Status: AC
  Administered 2017-02-18: 1000 mL via INTRAVENOUS

## 2017-02-18 MED ORDER — IOPAMIDOL (ISOVUE-370) INJECTION 76%
INTRAVENOUS | Status: AC
  Administered 2017-02-18: 100 mL via INTRAVENOUS
  Filled 2017-02-18: qty 100

## 2017-02-18 MED ORDER — FENTANYL CITRATE (PF) 100 MCG/2ML IJ SOLN
25.0000 ug | Freq: Once | INTRAMUSCULAR | Status: AC
  Administered 2017-02-18: 25 ug via INTRAVENOUS
  Filled 2017-02-18: qty 2

## 2017-02-18 MED ORDER — DEXTROSE 5 % IV SOLN
1.0000 g | Freq: Once | INTRAVENOUS | Status: AC
  Administered 2017-02-18: 1 g via INTRAVENOUS
  Filled 2017-02-18: qty 10

## 2017-02-18 NOTE — ED Notes (Signed)
Hospitalist at bedside 

## 2017-02-18 NOTE — ED Notes (Signed)
Patient transported to CT 

## 2017-02-18 NOTE — Consult Note (Addendum)
Requesting physician: Carmin Muskrat, EDP  Primary Care Physician: Michel Harrow, PA-C  Reason for consultation: Potential admission   History of Present Illness: 83 y/o woman with leukemia, currently undergoing chemotherapy presents with generalized malaise and weakness. She lives in independent living at [facility]. She denies CP/SOB, HA, blurry or double vision or focal weakness. Work up in the ED has been essentially unremarkable: VSS, labs WNL except for trop of 0.04, WBCs 11.7. EKG without ischemic changes, CT chest, abd, pelvis without acute changes. I have been asked to see her for potential admission.  Allergies:   Allergies  Allergen Reactions  . Penicillins Hives  . Tegretol [Carbamazepine] Other (See Comments)    Turned purple from neck down and drowsy  . Lyrica [Pregabalin] Other (See Comments)    Drowsy      Past Medical History:  Diagnosis Date  . Cancer [facility])    non hodgkin lymphoma  . Chronic lymphocytic leukemia ([place])   . History of B-cell lymphoma 11/02/2015  . Migraines   . Osteoporosis   . Thyroid disease   . Trigeminal neuralgia of left side of face     Past Surgical History:  Procedure Laterality Date  . gamma knife     for trigeminal neuralgia  . SHOULDER SURGERY     Right shoulder replacement  . TONSILLECTOMY AND ADENOIDECTOMY    . WISDOM TOOTH EXTRACTION      Scheduled Meds: Continuous Infusions: . cefTRIAXone (ROCEPHIN)  IV 1 g (02/18/17 1756)   PRN Meds:.  Social History:  reports that she quit smoking about 41 years ago. She has a 10.00 pack-year smoking history. She has never used smokeless tobacco. She reports that she drinks about 1.2 oz of alcohol per week . She reports that she does not use drugs.  Family History  Problem Relation Age of Onset  . Cancer Father        unknown ca  . Pneumonia Father   . Cancer Brother        prostate ca  . Stroke Brother   . Heart attack Mother     Review of Systems:    Constitutional: Denies fever, chills, diaphoresis, appetite change and fatigue.  HEENT: Denies photophobia, eye pain, redness, hearing loss, ear pain, congestion, sore throat, rhinorrhea, sneezing, mouth sores, trouble swallowing, neck pain, neck stiffness and tinnitus.   Respiratory: Denies SOB, DOE, cough, chest tightness,  and wheezing.   Cardiovascular: Denies chest pain, palpitations and leg swelling.  Gastrointestinal: Denies nausea, vomiting, abdominal pain, diarrhea, constipation, blood in stool and abdominal distention.  Genitourinary: Denies dysuria, urgency, frequency, hematuria, flank pain and difficulty urinating.  Endocrine: Denies: hot or cold intolerance, sweats, changes in hair or nails, polyuria, polydipsia. Musculoskeletal: Denies myalgias, back pain, joint swelling, arthralgias and gait problem.  Skin: Denies pallor, rash and wound.  Neurological: Denies dizziness, seizures, syncope,light-headedness, numbness and headaches.  Hematological: Denies adenopathy. Easy bruising, personal or family bleeding history  Psychiatric/Behavioral: Denies suicidal ideation, mood changes, confusion, nervousness, sleep disturbance and agitation   Physical Exam: Blood pressure 116/62, pulse (!) 102, temperature 98.4 F (36.9 C), temperature source Oral, resp. rate 20, SpO2 93 %.  Gen: AA Ox3 HEENT: [place]/AT/PERRL/EOMI Neck: supple, no JVD, no LAD, no bruits, no goiter CV: RRR, no M/R?G Lungs: CTA B Abd: S/NT/ND/+BS Ext: no C/C/E Neuro: grossly intact and non-focal, generally weak.  Labs on Admission:  Results for orders placed or performed during the hospital encounter of 02/18/17 (from the  past 48 hour(s))  Comprehensive metabolic panel     Status: Abnormal   Collection Time: 02/18/17 11:50 AM  Result Value Ref Range   Sodium 137 135 - 145 mmol/L   Potassium 3.7 3.5 - 5.1 mmol/L   Chloride 102 101 - 111 mmol/L   CO2 24 22 - 32 mmol/L   Glucose, Bld 109 (H) 65 - 99 mg/dL   BUN 32  (H) 6 - 20 mg/dL   Creatinine, Ser 0.83 0.44 - 1.00 mg/dL   Calcium 8.3 (L) 8.9 - 10.3 mg/dL   Total Protein 8.6 (H) 6.5 - 8.1 g/dL   Albumin 2.4 (L) 3.5 - 5.0 g/dL   AST 32 15 - 41 U/L   ALT 16 14 - 54 U/L   Alkaline Phosphatase 37 (L) 38 - 126 U/L   Total Bilirubin 1.7 (H) 0.3 - 1.2 mg/dL   GFR calc non Af Amer >60 >60 mL/min   GFR calc Af Amer >60 >60 mL/min    Comment: (NOTE) The eGFR has been calculated using the CKD EPI equation. This calculation has not been validated in all clinical situations. eGFR's persistently <60 mL/min signify possible Chronic Kidney Disease.    Anion gap 11 5 - 15  Lipase, blood     Status: None   Collection Time: 02/18/17 11:50 AM  Result Value Ref Range   Lipase 42 11 - 51 U/L  Troponin I     Status: Abnormal   Collection Time: 02/18/17 11:50 AM  Result Value Ref Range   Troponin I 0.04 (HH) <0.03 ng/mL    Comment: CRITICAL RESULT CALLED TO, READ BACK BY AND VERIFIED WITH: BINGHAM,S. RN AT 4709 02/18/17 MULLINS,T   CBC with Differential     Status: Abnormal   Collection Time: 02/18/17 11:50 AM  Result Value Ref Range   WBC 11.7 (H) 4.0 - 10.5 K/uL    Comment: WHITE COUNT CONFIRMED ON SMEAR   RBC 3.03 (L) 3.87 - 5.11 MIL/uL   Hemoglobin 10.2 (L) 12.0 - 15.0 g/dL   HCT 29.7 (L) 36.0 - 46.0 %   MCV 98.0 78.0 - 100.0 fL   MCH 33.7 26.0 - 34.0 pg   MCHC 34.3 30.0 - 36.0 g/dL   RDW 15.6 (H) 11.5 - 15.5 %   Platelets 182 150 - 400 K/uL    Comment: REPEATED TO VERIFY SPECIMEN CHECKED FOR CLOTS PLATELET COUNT CONFIRMED BY SMEAR    Neutrophils Relative % 85 %   Lymphocytes Relative 14 %   Monocytes Relative 1 %   Eosinophils Relative 0 %   Basophils Relative 0 %   Neutro Abs 10.0 (H) 1.7 - 7.7 K/uL   Lymphs Abs 1.6 0.7 - 4.0 K/uL   Monocytes Absolute 0.1 0.1 - 1.0 K/uL   Eosinophils Absolute 0.0 0.0 - 0.7 K/uL   Basophils Absolute 0.0 0.0 - 0.1 K/uL   WBC Morphology VACUOLATED NEUTROPHILS     Comment: PLASMACYTOID LYMPHS PLASMA CELLS    Protime-INR     Status: Abnormal   Collection Time: 02/18/17 11:50 AM  Result Value Ref Range   Prothrombin Time 15.4 (H) 11.4 - 15.2 seconds   INR 1.21   D-dimer, quantitative     Status: Abnormal   Collection Time: 02/18/17 11:50 AM  Result Value Ref Range   D-Dimer, Quant 1.55 (H) 0.00 - 0.50 ug/mL-FEU    Comment: (NOTE) At the manufacturer cut-off of 0.50 ug/mL FEU, this assay has been documented to exclude PE with a sensitivity  and negative predictive value of 97 to 99%.  At this time, this assay has not been approved by the FDA to exclude DVT/VTE. Results should be correlated with clinical presentation.   Pathologist smear review     Status: None   Collection Time: 02/18/17 12:00 PM  Result Value Ref Range   Path Review Reviewed By Violet Baldy, M.D.     Comment: 5.21.18 ATYPICAL LYMPHOID CELLS MIXED WITH PLASMA CELLS. FLOW CYTOMETRIC STUDIES ARE RECOMMENDED.    I-Stat CG4 Lactic Acid, ED     Status: None   Collection Time: 02/18/17 12:06 PM  Result Value Ref Range   Lactic Acid, Venous 1.48 0.5 - 1.9 mmol/L  Brain natriuretic peptide     Status: Abnormal   Collection Time: 02/18/17  1:05 PM  Result Value Ref Range   B Natriuretic Peptide 175.9 (H) 0.0 - 100.0 pg/mL  Urinalysis, Routine w reflex microscopic     Status: Abnormal   Collection Time: 02/18/17  1:18 PM  Result Value Ref Range   Color, Urine AMBER (A) YELLOW    Comment: BIOCHEMICALS MAY BE AFFECTED BY COLOR   APPearance CLOUDY (A) CLEAR   Specific Gravity, Urine 1.018 1.005 - 1.030   pH 5.0 5.0 - 8.0   Glucose, UA NEGATIVE NEGATIVE mg/dL   Hgb urine dipstick MODERATE (A) NEGATIVE   Bilirubin Urine NEGATIVE NEGATIVE   Ketones, ur 5 (A) NEGATIVE mg/dL   Protein, ur 30 (A) NEGATIVE mg/dL   Nitrite NEGATIVE NEGATIVE   Leukocytes, UA TRACE (A) NEGATIVE   RBC / HPF 6-30 0 - 5 RBC/hpf   WBC, UA 6-30 0 - 5 WBC/hpf   Bacteria, UA FEW (A) NONE SEEN   Squamous Epithelial / LPF 0-5 (A) NONE SEEN   Mucous  PRESENT    Hyaline Casts, UA PRESENT     Radiological Exams on Admission: Dg Chest 2 View  Result Date: 02/18/2017 CLINICAL DATA:  Weakness.  Chemotherapy. EXAM: CHEST  2 VIEW COMPARISON:  PET-CT 01/04/2017 FINDINGS: Trace pleural effusions. No air bronchogram or pulmonary edema. Normal heart size and mediastinal contours. Subtle density overlapping the left clavicle is favored secondary to the pleural scarring seen on the recent PET-CT. Glenohumeral arthroplasty on the right with chronic malalignment. IMPRESSION: Trace pleural effusions.  No evidence of pneumonia or edema. Electronically Signed   By: Monte Fantasia M.D.   On: 02/18/2017 12:42   Ct Angio Chest Pe W Or Wo Contrast  Result Date: 02/18/2017 CLINICAL DATA:  History of leukemia with nausea and elevated LFTs EXAM: CT ANGIOGRAPHY CHEST CT ABDOMEN AND PELVIS WITH CONTRAST TECHNIQUE: Multidetector CT imaging of the chest was performed using the standard protocol during bolus administration of intravenous contrast. Multiplanar CT image reconstructions and MIPs were obtained to evaluate the vascular anatomy. Multidetector CT imaging of the abdomen and pelvis was performed using the standard protocol during bolus administration of intravenous contrast. CONTRAST:  100 mL Isovue 370 COMPARISON:  01/04/2017 FINDINGS: CTA CHEST FINDINGS Cardiovascular: Thoracic aorta demonstrates atherosclerotic calcification although no aneurysmal dilatation or findings of dissection are seen. The coronary arteries demonstrate diffuse calcification. Adequate opacification of the pulmonary artery is seen. A normal branching pattern is noted. No filling defects to suggest pulmonary emboli are identified. Mild cardiac enlargement is seen. Mediastinum/Nodes: The thoracic inlet is within normal limits. The sizable mediastinal adenopathy is noted. A few small right hilar and infrahilar lymph nodes are noted likely reactive in nature. Lungs/Pleura: Apical scarring is again  noted with some  associated calcification. This may represent pleural plaquing. Mild dependent atelectatic changes are noted bilaterally slightly greater on the right than the left. No sizable parenchymal nodules are seen. Musculoskeletal: Postsurgical changes are noted in the right shoulder. Degenerative change of the thoracic spine is seen. No rib fractures are noted. Review of the MIP images confirms the above findings. CT ABDOMEN and PELVIS FINDINGS Hepatobiliary: The liver is diffusely fatty infiltrated. The gallbladder demonstrates a few dependent gallstones. A few small hepatic cysts are seen. Pancreas: Unremarkable. No pancreatic ductal dilatation or surrounding inflammatory changes. Spleen: Mild prominence of the spleen is again noted but stable from the recent exam. Adrenals/Urinary Tract: The adrenal glands are within normal limits bilaterally. Left kidney demonstrates a few small cysts. The right kidney demonstrates nonobstructing lower pole renal stone measuring 9 mm. This is stable in appearance from the prior exam. No ureteral stones are seen. The bladder is partially distended. Stomach/Bowel: Diffuse diverticular change of the colon is noted. No diverticulitis is seen. The appendix is well visualized without inflammatory change. Vascular/Lymphatic: Vascular calcifications are noted without aneurysmal dilatation. Scattered stable retroperitoneal lymph nodes are seen when compared with the prior exam. No new significant lymphadenopathy is noted. Reproductive: Uterine calcifications are noted consistent with fibroid disease. Some cystic changes are noted the region of the right ovary better visualized than on the prior exam due to the presence of contrast material. Other: No abdominal wall hernia or abnormality. No abdominopelvic ascites. Musculoskeletal: Old pelvic fractures with healing are again seen. Degenerative changes of the lumbar spine are also noted. Review of the MIP images confirms the above  findings. IMPRESSION: CTA of the chest:  No evidence of pulmonary emboli. Mild bibasilar atelectatic changes with some reactive right hilar lymph nodes. Changes in the apices with associated calcification. This may represent some pleural plaquing. CT of the abdomen and pelvis: Stable prominence of the spleen as well as retroperitoneal lymphadenopathy similar to that seen on prior PET-CT. Nonobstructing right renal calculus. Uterine fibroid change. No acute abnormality is noted. Electronically Signed   By: Inez Catalina M.D.   On: 02/18/2017 15:54   Ct Abdomen Pelvis W Contrast  Result Date: 02/18/2017 CLINICAL DATA:  History of leukemia with nausea and elevated LFTs EXAM: CT ANGIOGRAPHY CHEST CT ABDOMEN AND PELVIS WITH CONTRAST TECHNIQUE: Multidetector CT imaging of the chest was performed using the standard protocol during bolus administration of intravenous contrast. Multiplanar CT image reconstructions and MIPs were obtained to evaluate the vascular anatomy. Multidetector CT imaging of the abdomen and pelvis was performed using the standard protocol during bolus administration of intravenous contrast. CONTRAST:  100 mL Isovue 370 COMPARISON:  01/04/2017 FINDINGS: CTA CHEST FINDINGS Cardiovascular: Thoracic aorta demonstrates atherosclerotic calcification although no aneurysmal dilatation or findings of dissection are seen. The coronary arteries demonstrate diffuse calcification. Adequate opacification of the pulmonary artery is seen. A normal branching pattern is noted. No filling defects to suggest pulmonary emboli are identified. Mild cardiac enlargement is seen. Mediastinum/Nodes: The thoracic inlet is within normal limits. The sizable mediastinal adenopathy is noted. A few small right hilar and infrahilar lymph nodes are noted likely reactive in nature. Lungs/Pleura: Apical scarring is again noted with some associated calcification. This may represent pleural plaquing. Mild dependent atelectatic changes  are noted bilaterally slightly greater on the right than the left. No sizable parenchymal nodules are seen. Musculoskeletal: Postsurgical changes are noted in the right shoulder. Degenerative change of the thoracic spine is seen. No rib fractures are noted. Review of the  MIP images confirms the above findings. CT ABDOMEN and PELVIS FINDINGS Hepatobiliary: The liver is diffusely fatty infiltrated. The gallbladder demonstrates a few dependent gallstones. A few small hepatic cysts are seen. Pancreas: Unremarkable. No pancreatic ductal dilatation or surrounding inflammatory changes. Spleen: Mild prominence of the spleen is again noted but stable from the recent exam. Adrenals/Urinary Tract: The adrenal glands are within normal limits bilaterally. Left kidney demonstrates a few small cysts. The right kidney demonstrates nonobstructing lower pole renal stone measuring 9 mm. This is stable in appearance from the prior exam. No ureteral stones are seen. The bladder is partially distended. Stomach/Bowel: Diffuse diverticular change of the colon is noted. No diverticulitis is seen. The appendix is well visualized without inflammatory change. Vascular/Lymphatic: Vascular calcifications are noted without aneurysmal dilatation. Scattered stable retroperitoneal lymph nodes are seen when compared with the prior exam. No new significant lymphadenopathy is noted. Reproductive: Uterine calcifications are noted consistent with fibroid disease. Some cystic changes are noted the region of the right ovary better visualized than on the prior exam due to the presence of contrast material. Other: No abdominal wall hernia or abnormality. No abdominopelvic ascites. Musculoskeletal: Old pelvic fractures with healing are again seen. Degenerative changes of the lumbar spine are also noted. Review of the MIP images confirms the above findings. IMPRESSION: CTA of the chest:  No evidence of pulmonary emboli. Mild bibasilar atelectatic changes with  some reactive right hilar lymph nodes. Changes in the apices with associated calcification. This may represent some pleural plaquing. CT of the abdomen and pelvis: Stable prominence of the spleen as well as retroperitoneal lymphadenopathy similar to that seen on prior PET-CT. Nonobstructing right renal calculus. Uterine fibroid change. No acute abnormality is noted. Electronically Signed   By: Inez Catalina M.D.   On: 02/18/2017 15:54    Assessment/Plan  Generalized Weakness -Likely due to leukemia, advanced age and effects of chemotherapy. -No signs of infection or electrolyte abnormalities. -Trop of 0.04 is likely insignificant given lack of symptoms and EKG changes and no further cardiac work up is recommended at this time. -Discussed in detail with CSW; she will [facility] for respite care for which she agreed to privately pay for. She does not meet criteria for an inpatient admission.  Time Spent on Consultation: 85 minutes  HERNANDEZ ACOSTA,ESTELA [facility]  [phone] 02/18/2017, 6:00 PM

## 2017-02-18 NOTE — ED Notes (Signed)
Patient requesting to take home medication. Verified with Dr. Vanita Panda patient can take medications.

## 2017-02-18 NOTE — Progress Notes (Signed)
Consult request has been received. CSW attempting to follow up at present time.  Betzaida Cremeens F. Jaiden Dinkins, LCSWA, LCAS Clinical Social Worker Ph: [phone]  

## 2017-02-18 NOTE — ED Triage Notes (Signed)
Per [facility] pt from [facility] independent living facility. Pt has leukemia and received chemo on the 15th. Yesterday was c/o nausea and was relieved by Zofran. Today pt is c/o bilateral hand pain and weakness.

## 2017-02-18 NOTE — Progress Notes (Signed)
CSW received a call from Sales executive of nursing at [facility] has been accepted by: [facility] [place] [facility] for "Respite Care"  Pt and pt's sister-in-law Steve Gregg at ph: [phone] had heard options for skilled nursing or Respite care and have chosen Respite Care and understand pt will have to pay cash due to not having 3-day inpatient qualifying stay at this time. Number for report is: [phone].  Please ask for "Assisted Living Unit" when calling and ask for RN. Pt's room/bed number will be: Room 39 Pt's unit will be: Respite Care in Assisted Living Accepting physician: Assisted Living MD   Per [facility] please offer pt meal if patient cannot arrive quickly.    Pt can arrive ASAP on 5/21  CSW will update RN and EDP.  Alphonse Guild. Nilah Belcourt, Latanya Presser, LCAS Clinical Social Worker Ph: [phone]

## 2017-02-18 NOTE — ED Notes (Signed)
Social work at bedside.  

## 2017-02-18 NOTE — ED Provider Notes (Signed)
  Physical Exam  BP 116/62   Pulse (!) 102   Temp 98.4 F (36.9 C) (Oral)   Resp 20   SpO2 93%   Physical Exam  ED Course  Procedures  MDM Patient seen by Dr. Vanita Panda earlier. Patient has lymphoma and has worsening weakness. UA ? UTI. Borderline trop 0.04. Dr. Jerilee Hoh from hospitalist saw patient. She consulted social work and was able to get her to assisted living. Family in agreement. She doesn't want further cardiac workup. UA ? UTI. Given rocephin. Urine culture sent. Will sent patient back with keflex.      Drenda Freeze, MD 02/18/17 [phone]

## 2017-02-18 NOTE — Discharge Instructions (Signed)
You are likely weak from your cancer.   You also may have a urinary tract infection. Take keflex 500 mg three times daily for a week.   See your doctor   Return to ER if you have worse weakness, fever, vomiting, passing out, chest pain, trouble breathing.

## 2017-02-18 NOTE — ED Notes (Signed)
Family at bedside. 

## 2017-02-18 NOTE — ED Provider Notes (Signed)
[facility] DEPT Provider Note   CSN: [id number] Arrival date & time: 02/18/17  1112     History   Chief Complaint Chief Complaint  Patient presents with  . Weakness    HPI Joy Lynch is a 83 y.o. female.  HPI Patient presents with concern of weakness, soreness in multiple areas. Patient states that this began about 2 days ago, initially with nausea. She saw her physicians after that started, and was encouraged to drink plenty of fluids, get plenty of rest. Today the patient awoke with soreness in her hands, arms, legs, as well as generalized weakness, without dyspnea, fever, syncope, chest pain. Patient has a notable history of leukemia, received chemotherapy within the past weeks. This is a recurrence, last chemotherapy was several years ago.   Past Medical History:  Diagnosis Date  . Cancer [facility])    non hodgkin lymphoma  . Chronic lymphocytic leukemia ([place])   . History of B-cell lymphoma 11/02/2015  . Migraines   . Osteoporosis   . Thyroid disease   . Trigeminal neuralgia of left side of face     Patient Active Problem List   Diagnosis Date Noted  . Pancytopenia, acquired ([place]) 02/12/2017  . Other constipation 02/12/2017  . Goals of care, counseling/discussion 01/24/2017  . Lymphoma, marginal zone, lymph nodes of multiple sites ([place]) 12/24/2016  . Anemia in neoplastic disease 12/24/2016  . Trigeminal neuralgia 08/13/2016  . Thrombocytopenia ([place]) 06/26/2016  . Elevated total protein 06/26/2016  . History of B-cell lymphoma 11/02/2015    Past Surgical History:  Procedure Laterality Date  . gamma knife     for trigeminal neuralgia  . SHOULDER SURGERY     Right shoulder replacement  . TONSILLECTOMY AND ADENOIDECTOMY    . WISDOM TOOTH EXTRACTION      OB History    No data available       Home Medications    Prior to Admission medications   Medication Sig Start Date End Date Taking? Authorizing Provider  acyclovir (ZOVIRAX) 400 MG tablet Take 1  tablet (400 mg total) by mouth daily. 01/23/17   Heath Lark, MD  alendronate (FOSAMAX) 70 MG tablet Take 70 mg by mouth once a week. 09/19/15   [provider]  allopurinol (ZYLOPRIM) 300 MG tablet Take 1 tablet (300 mg total) by mouth daily. 01/23/17   Heath Lark, MD  aspirin 81 MG tablet Take 81 mg by mouth daily.    [provider]  calcium-vitamin D (OSCAL WITH D) 500-200 MG-UNIT tablet Take 1 tablet by mouth daily.     [provider]  fluticasone Asencion Islam) 50 MCG/ACT nasal spray  11/26/16   [provider]  gabapentin (NEURONTIN) 300 MG capsule Take 300 mg by mouth 3 (three) times daily as needed. 12/07/16   [provider]  Ibrutinib 280 MG TABS Take 280 mg by mouth daily. Patient not taking: Reported on 01/23/2017 12/24/16   Heath Lark, MD  levothyroxine (SYNTHROID, LEVOTHROID) 75 MCG tablet Take 75 mcg by mouth daily. Six days per week 08/29/15   [provider]  Omega-3 Fatty Acids (FISH OIL PO) Take by mouth 2 (two) times daily.    [provider]  ondansetron (ZOFRAN) 8 MG tablet Take 1 tablet (8 mg total) by mouth every 8 (eight) hours as needed for refractory nausea / vomiting. Patient not taking: Reported on 02/12/2017 01/23/17   Heath Lark, MD  prochlorperazine (COMPAZINE) 10 MG tablet Take 1 tablet (10 mg total) by mouth every 6 (  six) hours as needed (Nausea or vomiting). Patient not taking: Reported on 02/12/2017 01/23/17   Heath Lark, MD  propranolol (INDERAL) 10 MG tablet Take 10 mg by mouth daily.    [provider]    Family History Family History  Problem Relation Age of Onset  . Cancer Father        unknown ca  . Pneumonia Father   . Cancer Brother        prostate ca  . Stroke Brother   . Heart attack Mother     Social History Social History  Substance Use Topics  . Smoking status: Former Smoker    Packs/day: 0.50    Years: 20.00    Quit date: 10/02/1975  . Smokeless tobacco: Never Used  .  Alcohol use 1.2 oz/week    2 Glasses of wine per week     Allergies   Penicillins; Tegretol [carbamazepine]; and Lyrica [pregabalin]   Review of Systems Review of Systems  Constitutional:       Per HPI, otherwise negative  HENT:       Per HPI, otherwise negative  Respiratory:       Per HPI, otherwise negative  Cardiovascular:       Per HPI, otherwise negative  Gastrointestinal: Positive for nausea. Negative for vomiting.  Endocrine:       Negative aside from HPI  Genitourinary:       Neg aside from HPI   Musculoskeletal:       Per HPI, otherwise negative  Skin: Negative.   Allergic/Immunologic: Positive for immunocompromised state.  Neurological: Positive for weakness. Negative for syncope.     Physical Exam Updated Vital Signs BP 122/64 (BP Location: Left Arm)   Pulse (!) 112   Temp 98.4 F (36.9 C) (Oral)   Resp 20   SpO2 94%   Physical Exam  Constitutional: She is oriented to person, place, and time. She has a sickly appearance. No distress.  HENT:  Head: Normocephalic and atraumatic.  Eyes: Conjunctivae and EOM are normal.  Cardiovascular: Normal rate and regular rhythm.   Pulmonary/Chest: Effort normal and breath sounds normal. No stridor. No respiratory distress.  Abdominal: She exhibits no distension.  Musculoskeletal: She exhibits no edema.  Neurological: She is alert and oriented to person, place, and time. No cranial nerve deficit.  Skin: Skin is warm and dry.  Psychiatric: She has a normal mood and affect.  Nursing note and vitals reviewed.    ED Treatments / Results  Labs (all labs ordered are listed, but only abnormal results are displayed) Labs Reviewed  COMPREHENSIVE METABOLIC PANEL - Abnormal; Notable for the following:       Result Value   Glucose, Bld 109 (*)    BUN 32 (*)    Calcium 8.3 (*)    Total Protein 8.6 (*)    Albumin 2.4 (*)    Alkaline Phosphatase 37 (*)    Total Bilirubin 1.7 (*)    All other components within normal  limits  TROPONIN I - Abnormal; Notable for the following:    Troponin I 0.04 (*)    All other components within normal limits  CBC WITH DIFFERENTIAL/PLATELET - Abnormal; Notable for the following:    WBC 11.7 (*)    RBC 3.03 (*)    Hemoglobin 10.2 (*)    HCT 29.7 (*)    RDW 15.6 (*)    Neutro Abs 10.0 (*)    All other components within normal limits  PROTIME-INR -  Abnormal; Notable for the following:    Prothrombin Time 15.4 (*)    All other components within normal limits  D-DIMER, QUANTITATIVE (NOT AT [facility]) - Abnormal; Notable for the following:    D-Dimer, Quant 1.55 (*)    All other components within normal limits  URINALYSIS, ROUTINE W REFLEX MICROSCOPIC - Abnormal; Notable for the following:    Color, Urine AMBER (*)    APPearance CLOUDY (*)    Hgb urine dipstick MODERATE (*)    Ketones, ur 5 (*)    Protein, ur 30 (*)    Leukocytes, UA TRACE (*)    Bacteria, UA FEW (*)    Squamous Epithelial / LPF 0-5 (*)    All other components within normal limits  BRAIN NATRIURETIC PEPTIDE - Abnormal; Notable for the following:    B Natriuretic Peptide 175.9 (*)    All other components within normal limits  LIPASE, BLOOD  PATHOLOGIST SMEAR REVIEW  I-STAT CG4 LACTIC ACID, ED    Cardiac monitor 110 sinus tach abnormal  O2- 92-94% RA, low Radiology Dg Chest 2 View  Result Date: 02/18/2017 CLINICAL DATA:  Weakness.  Chemotherapy. EXAM: CHEST  2 VIEW COMPARISON:  PET-CT 01/04/2017 FINDINGS: Trace pleural effusions. No air bronchogram or pulmonary edema. Normal heart size and mediastinal contours. Subtle density overlapping the left clavicle is favored secondary to the pleural scarring seen on the recent PET-CT. Glenohumeral arthroplasty on the right with chronic malalignment. IMPRESSION: Trace pleural effusions.  No evidence of pneumonia or edema. Electronically Signed   By: Monte Fantasia M.D.   On: 02/18/2017 12:42   Ct Angio Chest Pe W Or Wo Contrast  Result Date:  02/18/2017 CLINICAL DATA:  History of leukemia with nausea and elevated LFTs EXAM: CT ANGIOGRAPHY CHEST CT ABDOMEN AND PELVIS WITH CONTRAST TECHNIQUE: Multidetector CT imaging of the chest was performed using the standard protocol during bolus administration of intravenous contrast. Multiplanar CT image reconstructions and MIPs were obtained to evaluate the vascular anatomy. Multidetector CT imaging of the abdomen and pelvis was performed using the standard protocol during bolus administration of intravenous contrast. CONTRAST:  100 mL Isovue 370 COMPARISON:  01/04/2017 FINDINGS: CTA CHEST FINDINGS Cardiovascular: Thoracic aorta demonstrates atherosclerotic calcification although no aneurysmal dilatation or findings of dissection are seen. The coronary arteries demonstrate diffuse calcification. Adequate opacification of the pulmonary artery is seen. A normal branching pattern is noted. No filling defects to suggest pulmonary emboli are identified. Mild cardiac enlargement is seen. Mediastinum/Nodes: The thoracic inlet is within normal limits. The sizable mediastinal adenopathy is noted. A few small right hilar and infrahilar lymph nodes are noted likely reactive in nature. Lungs/Pleura: Apical scarring is again noted with some associated calcification. This may represent pleural plaquing. Mild dependent atelectatic changes are noted bilaterally slightly greater on the right than the left. No sizable parenchymal nodules are seen. Musculoskeletal: Postsurgical changes are noted in the right shoulder. Degenerative change of the thoracic spine is seen. No rib fractures are noted. Review of the MIP images confirms the above findings. CT ABDOMEN and PELVIS FINDINGS Hepatobiliary: The liver is diffusely fatty infiltrated. The gallbladder demonstrates a few dependent gallstones. A few small hepatic cysts are seen. Pancreas: Unremarkable. No pancreatic ductal dilatation or surrounding inflammatory changes. Spleen: Mild  prominence of the spleen is again noted but stable from the recent exam. Adrenals/Urinary Tract: The adrenal glands are within normal limits bilaterally. Left kidney demonstrates a few small cysts. The right kidney demonstrates nonobstructing lower pole renal stone measuring 9  mm. This is stable in appearance from the prior exam. No ureteral stones are seen. The bladder is partially distended. Stomach/Bowel: Diffuse diverticular change of the colon is noted. No diverticulitis is seen. The appendix is well visualized without inflammatory change. Vascular/Lymphatic: Vascular calcifications are noted without aneurysmal dilatation. Scattered stable retroperitoneal lymph nodes are seen when compared with the prior exam. No new significant lymphadenopathy is noted. Reproductive: Uterine calcifications are noted consistent with fibroid disease. Some cystic changes are noted the region of the right ovary better visualized than on the prior exam due to the presence of contrast material. Other: No abdominal wall hernia or abnormality. No abdominopelvic ascites. Musculoskeletal: Old pelvic fractures with healing are again seen. Degenerative changes of the lumbar spine are also noted. Review of the MIP images confirms the above findings. IMPRESSION: CTA of the chest:  No evidence of pulmonary emboli. Mild bibasilar atelectatic changes with some reactive right hilar lymph nodes. Changes in the apices with associated calcification. This may represent some pleural plaquing. CT of the abdomen and pelvis: Stable prominence of the spleen as well as retroperitoneal lymphadenopathy similar to that seen on prior PET-CT. Nonobstructing right renal calculus. Uterine fibroid change. No acute abnormality is noted. Electronically Signed   By: Inez Catalina M.D.   On: 02/18/2017 15:54   Ct Abdomen Pelvis W Contrast  Result Date: 02/18/2017 CLINICAL DATA:  History of leukemia with nausea and elevated LFTs EXAM: CT ANGIOGRAPHY CHEST CT  ABDOMEN AND PELVIS WITH CONTRAST TECHNIQUE: Multidetector CT imaging of the chest was performed using the standard protocol during bolus administration of intravenous contrast. Multiplanar CT image reconstructions and MIPs were obtained to evaluate the vascular anatomy. Multidetector CT imaging of the abdomen and pelvis was performed using the standard protocol during bolus administration of intravenous contrast. CONTRAST:  100 mL Isovue 370 COMPARISON:  01/04/2017 FINDINGS: CTA CHEST FINDINGS Cardiovascular: Thoracic aorta demonstrates atherosclerotic calcification although no aneurysmal dilatation or findings of dissection are seen. The coronary arteries demonstrate diffuse calcification. Adequate opacification of the pulmonary artery is seen. A normal branching pattern is noted. No filling defects to suggest pulmonary emboli are identified. Mild cardiac enlargement is seen. Mediastinum/Nodes: The thoracic inlet is within normal limits. The sizable mediastinal adenopathy is noted. A few small right hilar and infrahilar lymph nodes are noted likely reactive in nature. Lungs/Pleura: Apical scarring is again noted with some associated calcification. This may represent pleural plaquing. Mild dependent atelectatic changes are noted bilaterally slightly greater on the right than the left. No sizable parenchymal nodules are seen. Musculoskeletal: Postsurgical changes are noted in the right shoulder. Degenerative change of the thoracic spine is seen. No rib fractures are noted. Review of the MIP images confirms the above findings. CT ABDOMEN and PELVIS FINDINGS Hepatobiliary: The liver is diffusely fatty infiltrated. The gallbladder demonstrates a few dependent gallstones. A few small hepatic cysts are seen. Pancreas: Unremarkable. No pancreatic ductal dilatation or surrounding inflammatory changes. Spleen: Mild prominence of the spleen is again noted but stable from the recent exam. Adrenals/Urinary Tract: The adrenal  glands are within normal limits bilaterally. Left kidney demonstrates a few small cysts. The right kidney demonstrates nonobstructing lower pole renal stone measuring 9 mm. This is stable in appearance from the prior exam. No ureteral stones are seen. The bladder is partially distended. Stomach/Bowel: Diffuse diverticular change of the colon is noted. No diverticulitis is seen. The appendix is well visualized without inflammatory change. Vascular/Lymphatic: Vascular calcifications are noted without aneurysmal dilatation. Scattered stable retroperitoneal lymph  nodes are seen when compared with the prior exam. No new significant lymphadenopathy is noted. Reproductive: Uterine calcifications are noted consistent with fibroid disease. Some cystic changes are noted the region of the right ovary better visualized than on the prior exam due to the presence of contrast material. Other: No abdominal wall hernia or abnormality. No abdominopelvic ascites. Musculoskeletal: Old pelvic fractures with healing are again seen. Degenerative changes of the lumbar spine are also noted. Review of the MIP images confirms the above findings. IMPRESSION: CTA of the chest:  No evidence of pulmonary emboli. Mild bibasilar atelectatic changes with some reactive right hilar lymph nodes. Changes in the apices with associated calcification. This may represent some pleural plaquing. CT of the abdomen and pelvis: Stable prominence of the spleen as well as retroperitoneal lymphadenopathy similar to that seen on prior PET-CT. Nonobstructing right renal calculus. Uterine fibroid change. No acute abnormality is noted. Electronically Signed   By: Inez Catalina M.D.   On: 02/18/2017 15:54    Procedures Procedures (including critical care time)  Medications Ordered in ED Medications  sodium chloride 0.9 % bolus 1,000 mL (1,000 mLs Intravenous New Bag/Given 02/18/17 1204)  acetaminophen (TYLENOL) tablet 650 mg (650 mg Oral Given 02/18/17 1258)   fentaNYL (SUBLIMAZE) injection 25 mcg (25 mcg Intravenous Given 02/18/17 1450)  iopamidol (ISOVUE-370) 76 % injection (100 mLs Intravenous Contrast Given 02/18/17 1511)   EKG with sinus tachycardia, rate 100, borderline prolonged QT, premature atrial contraction, nonspecific T-wave changes, borderline EKG.   Initial Impression / Assessment and Plan / ED Course  I have reviewed the triage vital signs and the nursing notes.  Pertinent labs & imaging results that were available during my care of the patient were reviewed by me and considered in my medical decision making (see chart for details).  On repeat exam the patient is in similar condition, weak appearing. With elevated d-dimer, patient will CT scan performed.    On repeat exam the patient is awake, remains fatigued and appearance. I discussed all findings with her and her family member. Specifically we discussed the mild elevation in troponin, her generalized weakness, suspicion for chemotherapy as contribute to her presentation illness. Patient now states that she has not taken her home medication in 2 days. This is corroborated by the patient's sister. Given the persistent weakness, elevated troponin, some concern for metabolic demand, though greater suspicion for chemotherapy with weakness, nausea, elevated bilirubin level, patient was admitted for further evaluation and management.   Final Clinical Impressions(s) / ED Diagnoses  Weakness Elevated troponin Elevated bilirubin level   Carmin Muskrat, MD 02/18/17 1657

## 2017-02-18 NOTE — Care Management (Signed)
ED CM reviewed CM consult. Consult is for placement. CSW follows patients for placement. Will enter CSW referral. Venita Sheffield RN CCM

## 2017-02-18 NOTE — ED Notes (Signed)
Bed: BT66 Expected date:  Expected time:  Means of arrival:  Comments: EMS [phone]

## 2017-02-18 NOTE — ED Notes (Signed)
Report given to RN Remi at [facility]

## 2017-02-18 NOTE — ED Notes (Addendum)
Patient transported to XR. 

## 2017-02-18 NOTE — ED Notes (Signed)
Patient given water

## 2017-02-18 NOTE — ED Notes (Signed)
Dr.Lockwood at bedside  

## 2017-02-20 ENCOUNTER — Telehealth: Payer: Self-pay

## 2017-02-20 LAB — URINE CULTURE

## 2017-02-20 NOTE — Telephone Encounter (Signed)
Pt had chemo 5/9 rituxan/treanda and 5/10 treanda. She went to ER on Monday 5/21 because she couldn't walk and legs were not holding her up and she was nauseated. She has a lot of  red splotches on arms and face, showed up Tuesday (yesterday) spots on upper arms and forehead, and on chest and posterior shoulders. They are a little itchy.  Received ceftriaxone, and  Fentanyl in ED. Next OV with rituxan/treanda 03/06/17    Sister is asking what to do.

## 2017-02-20 NOTE — Telephone Encounter (Signed)
S/w Romie Minus per Dr Alvy Bimler note. She wrote down instructions. Pt has no nausea at this time.

## 2017-02-20 NOTE — Telephone Encounter (Signed)
I am wondering whether the rash could be due to mild allergy to rocephin she had received in the ER I recommend stop Keflex, culture not consistent with UTI Take benadryl 25 mg TID PO x 3 days Is nausea better? Is she taking anti-emetics? Please instruct her to do so

## 2017-02-21 ENCOUNTER — Telehealth: Payer: Self-pay | Admitting: *Deleted

## 2017-02-21 ENCOUNTER — Telehealth: Payer: Self-pay

## 2017-02-21 NOTE — Telephone Encounter (Signed)
Caregiver states patient had not taken benadryl this morning. She feels that benadryl did help. Will make sure she takes it on a schedule. Will also get hydrocortisone cream to use.

## 2017-02-21 NOTE — Telephone Encounter (Signed)
Pt called with update on phone # [phone]. The other one is her sister's

## 2017-02-21 NOTE — Telephone Encounter (Signed)
Pt called yesterday with rash. Today pt states she was a little better last night but worse this morning. Rash/ tiny little red specks all over her, face chest, stomach, upper legs, and back. Does itch intermittantly and  frequently. No swelling. She did use benadryl yesterday and this morning. Pt is wanting to speak with Dr Alvy Bimler. [phone]

## 2017-02-21 NOTE — Telephone Encounter (Signed)
Pt states she does not feel she can to chemo treatment on 6/6. States she has never felt this bad in her entire life. Also has appt with Dr Alvy Bimler. Willing to come in to talk with Dr Alvy Bimler that day.

## 2017-02-26 ENCOUNTER — Telehealth: Payer: Self-pay

## 2017-02-26 NOTE — Telephone Encounter (Signed)
Pt called to cancel infusion on 6/6. Instructed her appt had been changed to lab and MD only. She will try to keep appt.

## 2017-03-04 DIAGNOSIS — E039 Hypothyroidism, unspecified: Secondary | ICD-10-CM | POA: Diagnosis not present

## 2017-03-04 DIAGNOSIS — R531 Weakness: Secondary | ICD-10-CM | POA: Diagnosis not present

## 2017-03-04 DIAGNOSIS — C9110 Chronic lymphocytic leukemia of B-cell type not having achieved remission: Secondary | ICD-10-CM | POA: Diagnosis not present

## 2017-03-04 DIAGNOSIS — R350 Frequency of micturition: Secondary | ICD-10-CM | POA: Diagnosis not present

## 2017-03-06 ENCOUNTER — Encounter: Payer: Self-pay | Admitting: Hematology and Oncology

## 2017-03-06 ENCOUNTER — Other Ambulatory Visit (HOSPITAL_BASED_OUTPATIENT_CLINIC_OR_DEPARTMENT_OTHER): Payer: Medicare Other

## 2017-03-06 ENCOUNTER — Telehealth: Payer: Self-pay | Admitting: Hematology and Oncology

## 2017-03-06 ENCOUNTER — Ambulatory Visit (HOSPITAL_BASED_OUTPATIENT_CLINIC_OR_DEPARTMENT_OTHER): Payer: Medicare Other | Admitting: Hematology and Oncology

## 2017-03-06 ENCOUNTER — Ambulatory Visit: Payer: Medicare Other

## 2017-03-06 DIAGNOSIS — C8588 Other specified types of non-Hodgkin lymphoma, lymph nodes of multiple sites: Secondary | ICD-10-CM | POA: Diagnosis not present

## 2017-03-06 DIAGNOSIS — D630 Anemia in neoplastic disease: Secondary | ICD-10-CM | POA: Diagnosis not present

## 2017-03-06 DIAGNOSIS — Z7189 Other specified counseling: Secondary | ICD-10-CM

## 2017-03-06 LAB — COMPREHENSIVE METABOLIC PANEL
ALT: <6 U/L (ref 0–55)
AST: 16 U/L (ref 5–34)
Albumin: 2.6 g/dL — ABNORMAL LOW (ref 3.5–5.0)
Alkaline Phosphatase: 33 U/L — ABNORMAL LOW (ref 40–150)
Anion Gap: 5 mEq/L (ref 3–11)
BUN: 18.2 mg/dL (ref 7.0–26.0)
CO2: 30 mEq/L — ABNORMAL HIGH (ref 22–29)
Calcium: 9.4 mg/dL (ref 8.4–10.4)
Chloride: 103 mEq/L (ref 98–109)
Creatinine: 0.8 mg/dL (ref 0.6–1.1)
EGFR: 63 mL/min/{1.73_m2} — ABNORMAL LOW (ref >90)
Glucose: 107 mg/dl (ref 70–140)
Potassium: 4.3 mEq/L (ref 3.5–5.1)
Sodium: 138 mEq/L (ref 136–145)
Total Bilirubin: 0.68 mg/dL (ref 0.20–1.20)
Total Protein: 9.7 g/dL — ABNORMAL HIGH (ref 6.4–8.3)

## 2017-03-06 LAB — CBC WITH DIFFERENTIAL/PLATELET
BASO%: 0.7 % (ref 0.0–2.0)
Basophils Absolute: 0 10*3/uL (ref 0.0–0.1)
EOS%: 1.1 % (ref 0.0–7.0)
Eosinophils Absolute: 0.1 10*3/uL (ref 0.0–0.5)
HCT: 28.8 % — ABNORMAL LOW (ref 34.8–46.6)
HGB: 9.3 g/dL — ABNORMAL LOW (ref 11.6–15.9)
LYMPH%: 17.8 % (ref 14.0–49.7)
MCH: 33.7 pg (ref 25.1–34.0)
MCHC: 32.3 g/dL (ref 31.5–36.0)
MCV: 104.3 fL — ABNORMAL HIGH (ref 79.5–101.0)
MONO#: 0.3 10*3/uL (ref 0.1–0.9)
MONO%: 6.5 % (ref 0.0–14.0)
NEUT#: 3.3 10*3/uL (ref 1.5–6.5)
NEUT%: 73.9 % (ref 38.4–76.8)
Platelets: 216 10*3/uL (ref 145–400)
RBC: 2.76 10*6/uL — ABNORMAL LOW (ref 3.70–5.45)
RDW: 17.4 % — ABNORMAL HIGH (ref 11.2–14.5)
WBC: 4.5 10*3/uL (ref 3.9–10.3)
lymph#: 0.8 10*3/uL — ABNORMAL LOW (ref 0.9–3.3)
nRBC: 0 % (ref 0–0)

## 2017-03-06 NOTE — Progress Notes (Signed)
[facility] OFFICE PROGRESS NOTE  Patient Care Team: Hilbert Bible as PCP - General (Physician Assistant)  SUMMARY OF ONCOLOGIC HISTORY:   Lymphoma, marginal zone, lymph nodes of multiple sites [facility])   10/26/2011 Initial Diagnosis    The patient was diagnosed with marginal zone lymphoma/lymphoplasmacytic lymphoma in [place].  She received single agent rituximab,  weekly 4, completed by May 2013.  She was subsequently found to have disease relapse, and was treated with single agent bendamustine from January 2014 to June 2014, 70 mg/m in [place]      05/12/2013 Imaging    Outside CT showed no evidence of disease      11/18/2014 Imaging    Outside CT scan show no evidence of disease      11/03/2015 Pathology Results    Peripheral Blood Flow Cytometry - MONOCLONAL B CELL POPULATION IDENTIFIED. - SEE COMMENT. Diagnosis Comment: The majority of lymphocytes consist of a monoclonal population of B cells expressing pan B cell antigens including CD20 with associated expression of CD5, CD25, CD11c, and FMC7. No CD10 or CD103 expression is identified. The peripheral blood shows a population of atypical lymphocytes ranging from small to large cells with scanty to moderately abundant cytoplasm, dense to partially clumped chromatin and small to inconspicuous nucleoli. The findings are consistent with a B-cell lymphoproliferative process. Consideration should be given to chronic lymphocytic leukemia (variant or transformation), mantle cell lymphoma and other B cell lymphomas with rare CD5 expression. FISH studies may be helpful in this regard      01/04/2017 PET scan    1. Mildly enlarged retroperitoneal lymph nodes are present including a 1.4 cm node anterior to the aortic bifurcation which demonstrates low-grade metabolic activity with maximum SUV 2.5. This would be considered Deauville 3 activity. 2. Splenomegaly with splenic volume of 540 cc, but no focal splenic lesions  seen. 3. Other imaging findings of potential clinical significance: Considerable atherosclerosis. Bilateral nonobstructive nephrolithiasis. Extensive sigmoid colon diverticulosis. Pelvic floor laxity. Right shoulder prosthesis with activity along the joint margins likely inflammatory. Old pelvic fractures with sclerotic late phase healing response.       INTERVAL HISTORY: Please see below for problem oriented charting. She returns for further follow-up. Since the last time I saw her, she has profound fatigue, nausea and changes in bowel habits. Most of her symptoms has improved.  Her energy level is better. She is concerned about further chemotherapy and would like to stop treatment. She denies recent infection. The patient denies any recent signs or symptoms of bleeding such as spontaneous epistaxis, hematuria or hematochezia.  REVIEW OF SYSTEMS:   Constitutional: Denies fevers, chills or abnormal weight loss Eyes: Denies blurriness of vision Ears, nose, mouth, throat, and face: Denies mucositis or sore throat Respiratory: Denies cough, dyspnea or wheezes Cardiovascular: Denies palpitation, chest discomfort or lower extremity swelling Skin: Denies abnormal skin rashes Lymphatics: Denies new lymphadenopathy or easy bruising Behavioral/Psych: Mood is stable, no new changes  All other systems were reviewed with the patient and are negative.  I have reviewed the past medical history, past surgical history, social history and family history with the patient and they are unchanged from previous note.  ALLERGIES:  is allergic to penicillins; tegretol [carbamazepine]; and lyrica [pregabalin].  MEDICATIONS:  Current Outpatient Prescriptions  Medication Sig Dispense Refill  . alendronate (FOSAMAX) 70 MG tablet Take 70 mg by mouth once a week.  2  . aspirin 81 MG tablet Take 81 mg by mouth daily.    Marland Kitchen  calcium-vitamin D (OSCAL WITH D) 500-200 MG-UNIT tablet Take 1 tablet by mouth daily.      Marland Kitchen gabapentin (NEURONTIN) 300 MG capsule Take 300 mg by mouth 3 (three) times daily as needed.    Marland Kitchen levothyroxine (SYNTHROID, LEVOTHROID) 75 MCG tablet Take 75 mcg by mouth daily. Six days per week  1  . Omega-3 Fatty Acids (FISH OIL PO) Take by mouth 2 (two) times daily.    . propranolol (INDERAL) 10 MG tablet Take 10 mg by mouth daily.    Marland Kitchen sulfamethoxazole-trimethoprim (BACTRIM DS,SEPTRA DS) 800-160 MG tablet     . acyclovir (ZOVIRAX) 400 MG tablet Take 1 tablet (400 mg total) by mouth daily. (Patient not taking: Reported on 03/06/2017) 30 tablet 3  . allopurinol (ZYLOPRIM) 300 MG tablet Take 1 tablet (300 mg total) by mouth daily. (Patient not taking: Reported on 03/06/2017) 30 tablet 3  . fluticasone (FLONASE) 50 MCG/ACT nasal spray     . Ibrutinib 280 MG TABS Take 280 mg by mouth daily. (Patient not taking: Reported on 01/23/2017) 30 tablet 11  . ondansetron (ZOFRAN) 8 MG tablet Take 1 tablet (8 mg total) by mouth every 8 (eight) hours as needed for refractory nausea / vomiting. (Patient not taking: Reported on 02/12/2017) 30 tablet 1  . prochlorperazine (COMPAZINE) 10 MG tablet Take 1 tablet (10 mg total) by mouth every 6 (six) hours as needed (Nausea or vomiting). (Patient not taking: Reported on 02/12/2017) 30 tablet 1   No current facility-administered medications for this visit.     PHYSICAL EXAMINATION: ECOG PERFORMANCE STATUS: 2 - Symptomatic, <50% confined to bed  Vitals:   03/06/17 1120  BP: (!) 109/50  Pulse: 91  Resp: 18  Temp: 98 F (36.7 C)   Filed Weights   03/06/17 1120  Weight: 120 lb 1.6 oz (54.5 kg)    GENERAL:alert, no distress and comfortable.  She looks thin and cachectic SKIN: skin color, texture, turgor are normal, no rashes or significant lesions EYES: normal, Conjunctiva are pale and non-injected, sclera clear Musculoskeletal:no cyanosis of digits and no clubbing  NEURO: alert & oriented x 3 with fluent speech, no focal motor/sensory deficits  LABORATORY  DATA:  I have reviewed the data as listed    Component Value Date/Time   NA 138 03/06/2017 1107   K 4.3 03/06/2017 1107   CL 102 02/18/2017 1150   CO2 30 (H) 03/06/2017 1107   GLUCOSE 107 03/06/2017 1107   BUN 18.2 03/06/2017 1107   CREATININE 0.8 03/06/2017 1107   CALCIUM 9.4 03/06/2017 1107   PROT 9.7 (H) 03/06/2017 1107   ALBUMIN 2.6 (L) 03/06/2017 1107   AST 16 03/06/2017 1107   ALT <6 03/06/2017 1107   ALKPHOS 33 (L) 03/06/2017 1107   BILITOT 0.68 03/06/2017 1107   GFRNONAA >60 02/18/2017 1150   GFRAA >60 02/18/2017 1150    No results found for: SPEP, UPEP  Lab Results  Component Value Date   WBC 4.5 03/06/2017   NEUTROABS 3.3 03/06/2017   HGB 9.3 (L) 03/06/2017   HCT 28.8 (L) 03/06/2017   MCV 104.3 (H) 03/06/2017   PLT 216 03/06/2017      Chemistry      Component Value Date/Time   NA 138 03/06/2017 1107   K 4.3 03/06/2017 1107   CL 102 02/18/2017 1150   CO2 30 (H) 03/06/2017 1107   BUN 18.2 03/06/2017 1107   CREATININE 0.8 03/06/2017 1107      Component Value Date/Time   CALCIUM  9.4 03/06/2017 1107   ALKPHOS 33 (L) 03/06/2017 1107   AST 16 03/06/2017 1107   ALT <6 03/06/2017 1107   BILITOT 0.68 03/06/2017 1107       RADIOGRAPHIC STUDIES: I have personally reviewed the radiological images as listed and agreed with the findings in the report. Dg Chest 2 View  Result Date: 02/18/2017 CLINICAL DATA:  Weakness.  Chemotherapy. EXAM: CHEST  2 VIEW COMPARISON:  PET-CT 01/04/2017 FINDINGS: Trace pleural effusions. No air bronchogram or pulmonary edema. Normal heart size and mediastinal contours. Subtle density overlapping the left clavicle is favored secondary to the pleural scarring seen on the recent PET-CT. Glenohumeral arthroplasty on the right with chronic malalignment. IMPRESSION: Trace pleural effusions.  No evidence of pneumonia or edema. Electronically Signed   By: Monte Fantasia M.D.   On: 02/18/2017 12:42   Ct Angio Chest Pe W Or Wo  Contrast  Result Date: 02/18/2017 CLINICAL DATA:  History of leukemia with nausea and elevated LFTs EXAM: CT ANGIOGRAPHY CHEST CT ABDOMEN AND PELVIS WITH CONTRAST TECHNIQUE: Multidetector CT imaging of the chest was performed using the standard protocol during bolus administration of intravenous contrast. Multiplanar CT image reconstructions and MIPs were obtained to evaluate the vascular anatomy. Multidetector CT imaging of the abdomen and pelvis was performed using the standard protocol during bolus administration of intravenous contrast. CONTRAST:  100 mL Isovue 370 COMPARISON:  01/04/2017 FINDINGS: CTA CHEST FINDINGS Cardiovascular: Thoracic aorta demonstrates atherosclerotic calcification although no aneurysmal dilatation or findings of dissection are seen. The coronary arteries demonstrate diffuse calcification. Adequate opacification of the pulmonary artery is seen. A normal branching pattern is noted. No filling defects to suggest pulmonary emboli are identified. Mild cardiac enlargement is seen. Mediastinum/Nodes: The thoracic inlet is within normal limits. The sizable mediastinal adenopathy is noted. A few small right hilar and infrahilar lymph nodes are noted likely reactive in nature. Lungs/Pleura: Apical scarring is again noted with some associated calcification. This may represent pleural plaquing. Mild dependent atelectatic changes are noted bilaterally slightly greater on the right than the left. No sizable parenchymal nodules are seen. Musculoskeletal: Postsurgical changes are noted in the right shoulder. Degenerative change of the thoracic spine is seen. No rib fractures are noted. Review of the MIP images confirms the above findings. CT ABDOMEN and PELVIS FINDINGS Hepatobiliary: The liver is diffusely fatty infiltrated. The gallbladder demonstrates a few dependent gallstones. A few small hepatic cysts are seen. Pancreas: Unremarkable. No pancreatic ductal dilatation or surrounding inflammatory  changes. Spleen: Mild prominence of the spleen is again noted but stable from the recent exam. Adrenals/Urinary Tract: The adrenal glands are within normal limits bilaterally. Left kidney demonstrates a few small cysts. The right kidney demonstrates nonobstructing lower pole renal stone measuring 9 mm. This is stable in appearance from the prior exam. No ureteral stones are seen. The bladder is partially distended. Stomach/Bowel: Diffuse diverticular change of the colon is noted. No diverticulitis is seen. The appendix is well visualized without inflammatory change. Vascular/Lymphatic: Vascular calcifications are noted without aneurysmal dilatation. Scattered stable retroperitoneal lymph nodes are seen when compared with the prior exam. No new significant lymphadenopathy is noted. Reproductive: Uterine calcifications are noted consistent with fibroid disease. Some cystic changes are noted the region of the right ovary better visualized than on the prior exam due to the presence of contrast material. Other: No abdominal wall hernia or abnormality. No abdominopelvic ascites. Musculoskeletal: Old pelvic fractures with healing are again seen. Degenerative changes of the lumbar spine are also noted.  Review of the MIP images confirms the above findings. IMPRESSION: CTA of the chest:  No evidence of pulmonary emboli. Mild bibasilar atelectatic changes with some reactive right hilar lymph nodes. Changes in the apices with associated calcification. This may represent some pleural plaquing. CT of the abdomen and pelvis: Stable prominence of the spleen as well as retroperitoneal lymphadenopathy similar to that seen on prior PET-CT. Nonobstructing right renal calculus. Uterine fibroid change. No acute abnormality is noted. Electronically Signed   By: Inez Catalina M.D.   On: 02/18/2017 15:54   Ct Abdomen Pelvis W Contrast  Result Date: 02/18/2017 CLINICAL DATA:  History of leukemia with nausea and elevated LFTs EXAM: CT  ANGIOGRAPHY CHEST CT ABDOMEN AND PELVIS WITH CONTRAST TECHNIQUE: Multidetector CT imaging of the chest was performed using the standard protocol during bolus administration of intravenous contrast. Multiplanar CT image reconstructions and MIPs were obtained to evaluate the vascular anatomy. Multidetector CT imaging of the abdomen and pelvis was performed using the standard protocol during bolus administration of intravenous contrast. CONTRAST:  100 mL Isovue 370 COMPARISON:  01/04/2017 FINDINGS: CTA CHEST FINDINGS Cardiovascular: Thoracic aorta demonstrates atherosclerotic calcification although no aneurysmal dilatation or findings of dissection are seen. The coronary arteries demonstrate diffuse calcification. Adequate opacification of the pulmonary artery is seen. A normal branching pattern is noted. No filling defects to suggest pulmonary emboli are identified. Mild cardiac enlargement is seen. Mediastinum/Nodes: The thoracic inlet is within normal limits. The sizable mediastinal adenopathy is noted. A few small right hilar and infrahilar lymph nodes are noted likely reactive in nature. Lungs/Pleura: Apical scarring is again noted with some associated calcification. This may represent pleural plaquing. Mild dependent atelectatic changes are noted bilaterally slightly greater on the right than the left. No sizable parenchymal nodules are seen. Musculoskeletal: Postsurgical changes are noted in the right shoulder. Degenerative change of the thoracic spine is seen. No rib fractures are noted. Review of the MIP images confirms the above findings. CT ABDOMEN and PELVIS FINDINGS Hepatobiliary: The liver is diffusely fatty infiltrated. The gallbladder demonstrates a few dependent gallstones. A few small hepatic cysts are seen. Pancreas: Unremarkable. No pancreatic ductal dilatation or surrounding inflammatory changes. Spleen: Mild prominence of the spleen is again noted but stable from the recent exam. Adrenals/Urinary  Tract: The adrenal glands are within normal limits bilaterally. Left kidney demonstrates a few small cysts. The right kidney demonstrates nonobstructing lower pole renal stone measuring 9 mm. This is stable in appearance from the prior exam. No ureteral stones are seen. The bladder is partially distended. Stomach/Bowel: Diffuse diverticular change of the colon is noted. No diverticulitis is seen. The appendix is well visualized without inflammatory change. Vascular/Lymphatic: Vascular calcifications are noted without aneurysmal dilatation. Scattered stable retroperitoneal lymph nodes are seen when compared with the prior exam. No new significant lymphadenopathy is noted. Reproductive: Uterine calcifications are noted consistent with fibroid disease. Some cystic changes are noted the region of the right ovary better visualized than on the prior exam due to the presence of contrast material. Other: No abdominal wall hernia or abnormality. No abdominopelvic ascites. Musculoskeletal: Old pelvic fractures with healing are again seen. Degenerative changes of the lumbar spine are also noted. Review of the MIP images confirms the above findings. IMPRESSION: CTA of the chest:  No evidence of pulmonary emboli. Mild bibasilar atelectatic changes with some reactive right hilar lymph nodes. Changes in the apices with associated calcification. This may represent some pleural plaquing. CT of the abdomen and pelvis: Stable prominence of  the spleen as well as retroperitoneal lymphadenopathy similar to that seen on prior PET-CT. Nonobstructing right renal calculus. Uterine fibroid change. No acute abnormality is noted. Electronically Signed   By: Inez Catalina M.D.   On: 02/18/2017 15:54    ASSESSMENT & PLAN:  Lymphoma, marginal zone, lymph nodes of multiple sites [facility]) She tolerated treatment very poorly with excessive fatigue, nausea and changes in bowel habits after recent treatment Most of her symptoms have improved Her  lymphocytosis and thrombocytopenia has improved She has mild persistent anemia likely due to treatment but overall stable Due to her age and difficulties tolerating treatment, I think is reasonable to wait and hold treatment for now Plan to see her back a month from now with repeat blood work, history and physical examination with plan for further discussion about plan of care  Anemia in neoplastic disease This is likely anemia of chronic disease. The patient denies recent history of bleeding such as epistaxis, hematuria or hematochezia. She is asymptomatic from the anemia. We will observe for now.  She does not require transfusion now.    Goals of care, counseling/discussion She has a very difficult time after treatment. We discussed the risk and benefits of discontinuation of treatment. Due to improvement of lymphocytosis, and recent poor tolerance to treatment, I think is reasonable to hold treatment and the plan is fully discussed with the patient and she agreed   No orders of the defined types were placed in this encounter.  All questions were answered. The patient knows to call the clinic with any problems, questions or concerns. No barriers to learning was detected. I spent 15 minutes counseling the patient face to face. The total time spent in the appointment was 20 minutes and more than 50% was on counseling and review of test results     Heath Lark, MD 03/06/2017 2:55 PM

## 2017-03-06 NOTE — Assessment & Plan Note (Signed)
She has a very difficult time after treatment. We discussed the risk and benefits of discontinuation of treatment. Due to improvement of lymphocytosis, and recent poor tolerance to treatment, I think is reasonable to hold treatment and the plan is fully discussed with the patient and she agreed

## 2017-03-06 NOTE — Assessment & Plan Note (Signed)
This is likely anemia of chronic disease. The patient denies recent history of bleeding such as epistaxis, hematuria or hematochezia. She is asymptomatic from the anemia. We will observe for now.  She does not require transfusion now.   

## 2017-03-06 NOTE — Telephone Encounter (Signed)
Gave  Patient AVS and calender per 6/6 los - lab and f/u 7/10

## 2017-03-06 NOTE — Assessment & Plan Note (Signed)
She tolerated treatment very poorly with excessive fatigue, nausea and changes in bowel habits after recent treatment Most of her symptoms have improved Her lymphocytosis and thrombocytopenia has improved She has mild persistent anemia likely due to treatment but overall stable Due to her age and difficulties tolerating treatment, I think is reasonable to wait and hold treatment for now Plan to see her back a month from now with repeat blood work, history and physical examination with plan for further discussion about plan of care

## 2017-03-07 ENCOUNTER — Ambulatory Visit: Payer: Medicare Other

## 2017-03-08 ENCOUNTER — Ambulatory Visit: Payer: Medicare Other

## 2017-03-08 ENCOUNTER — Other Ambulatory Visit: Payer: Medicare Other

## 2017-03-11 DIAGNOSIS — M6281 Muscle weakness (generalized): Secondary | ICD-10-CM | POA: Diagnosis not present

## 2017-03-11 DIAGNOSIS — R32 Unspecified urinary incontinence: Secondary | ICD-10-CM | POA: Diagnosis not present

## 2017-03-11 DIAGNOSIS — N3946 Mixed incontinence: Secondary | ICD-10-CM | POA: Diagnosis not present

## 2017-03-13 DIAGNOSIS — N3946 Mixed incontinence: Secondary | ICD-10-CM | POA: Diagnosis not present

## 2017-03-13 DIAGNOSIS — R32 Unspecified urinary incontinence: Secondary | ICD-10-CM | POA: Diagnosis not present

## 2017-03-13 DIAGNOSIS — M6281 Muscle weakness (generalized): Secondary | ICD-10-CM | POA: Diagnosis not present

## 2017-03-18 DIAGNOSIS — N3946 Mixed incontinence: Secondary | ICD-10-CM | POA: Diagnosis not present

## 2017-03-18 DIAGNOSIS — M6281 Muscle weakness (generalized): Secondary | ICD-10-CM | POA: Diagnosis not present

## 2017-03-18 DIAGNOSIS — R32 Unspecified urinary incontinence: Secondary | ICD-10-CM | POA: Diagnosis not present

## 2017-03-19 DIAGNOSIS — N3946 Mixed incontinence: Secondary | ICD-10-CM | POA: Diagnosis not present

## 2017-03-19 DIAGNOSIS — R32 Unspecified urinary incontinence: Secondary | ICD-10-CM | POA: Diagnosis not present

## 2017-03-19 DIAGNOSIS — M6281 Muscle weakness (generalized): Secondary | ICD-10-CM | POA: Diagnosis not present

## 2017-03-20 DIAGNOSIS — R32 Unspecified urinary incontinence: Secondary | ICD-10-CM | POA: Diagnosis not present

## 2017-03-20 DIAGNOSIS — N3946 Mixed incontinence: Secondary | ICD-10-CM | POA: Diagnosis not present

## 2017-03-20 DIAGNOSIS — M6281 Muscle weakness (generalized): Secondary | ICD-10-CM | POA: Diagnosis not present

## 2017-03-26 DIAGNOSIS — M6281 Muscle weakness (generalized): Secondary | ICD-10-CM | POA: Diagnosis not present

## 2017-03-26 DIAGNOSIS — R32 Unspecified urinary incontinence: Secondary | ICD-10-CM | POA: Diagnosis not present

## 2017-03-26 DIAGNOSIS — N3946 Mixed incontinence: Secondary | ICD-10-CM | POA: Diagnosis not present

## 2017-03-27 DIAGNOSIS — N3946 Mixed incontinence: Secondary | ICD-10-CM | POA: Diagnosis not present

## 2017-03-27 DIAGNOSIS — M6281 Muscle weakness (generalized): Secondary | ICD-10-CM | POA: Diagnosis not present

## 2017-03-27 DIAGNOSIS — R32 Unspecified urinary incontinence: Secondary | ICD-10-CM | POA: Diagnosis not present

## 2017-04-01 DIAGNOSIS — R32 Unspecified urinary incontinence: Secondary | ICD-10-CM | POA: Diagnosis not present

## 2017-04-01 DIAGNOSIS — M6281 Muscle weakness (generalized): Secondary | ICD-10-CM | POA: Diagnosis not present

## 2017-04-01 DIAGNOSIS — N3946 Mixed incontinence: Secondary | ICD-10-CM | POA: Diagnosis not present

## 2017-04-02 DIAGNOSIS — R32 Unspecified urinary incontinence: Secondary | ICD-10-CM | POA: Diagnosis not present

## 2017-04-02 DIAGNOSIS — M6281 Muscle weakness (generalized): Secondary | ICD-10-CM | POA: Diagnosis not present

## 2017-04-02 DIAGNOSIS — N3946 Mixed incontinence: Secondary | ICD-10-CM | POA: Diagnosis not present

## 2017-04-04 DIAGNOSIS — M6281 Muscle weakness (generalized): Secondary | ICD-10-CM | POA: Diagnosis not present

## 2017-04-04 DIAGNOSIS — R32 Unspecified urinary incontinence: Secondary | ICD-10-CM | POA: Diagnosis not present

## 2017-04-04 DIAGNOSIS — N3946 Mixed incontinence: Secondary | ICD-10-CM | POA: Diagnosis not present

## 2017-04-08 DIAGNOSIS — M6281 Muscle weakness (generalized): Secondary | ICD-10-CM | POA: Diagnosis not present

## 2017-04-08 DIAGNOSIS — N3946 Mixed incontinence: Secondary | ICD-10-CM | POA: Diagnosis not present

## 2017-04-08 DIAGNOSIS — R32 Unspecified urinary incontinence: Secondary | ICD-10-CM | POA: Diagnosis not present

## 2017-04-09 ENCOUNTER — Ambulatory Visit: Payer: Medicare Other | Admitting: Hematology and Oncology

## 2017-04-09 ENCOUNTER — Other Ambulatory Visit: Payer: Medicare Other

## 2017-04-09 DIAGNOSIS — M6281 Muscle weakness (generalized): Secondary | ICD-10-CM | POA: Diagnosis not present

## 2017-04-09 DIAGNOSIS — R32 Unspecified urinary incontinence: Secondary | ICD-10-CM | POA: Diagnosis not present

## 2017-04-09 DIAGNOSIS — N3946 Mixed incontinence: Secondary | ICD-10-CM | POA: Diagnosis not present

## 2017-04-11 DIAGNOSIS — N3946 Mixed incontinence: Secondary | ICD-10-CM | POA: Diagnosis not present

## 2017-04-11 DIAGNOSIS — R32 Unspecified urinary incontinence: Secondary | ICD-10-CM | POA: Diagnosis not present

## 2017-04-11 DIAGNOSIS — M6281 Muscle weakness (generalized): Secondary | ICD-10-CM | POA: Diagnosis not present

## 2017-04-15 DIAGNOSIS — N3946 Mixed incontinence: Secondary | ICD-10-CM | POA: Diagnosis not present

## 2017-04-15 DIAGNOSIS — R32 Unspecified urinary incontinence: Secondary | ICD-10-CM | POA: Diagnosis not present

## 2017-04-15 DIAGNOSIS — M6281 Muscle weakness (generalized): Secondary | ICD-10-CM | POA: Diagnosis not present

## 2017-04-17 DIAGNOSIS — N3946 Mixed incontinence: Secondary | ICD-10-CM | POA: Diagnosis not present

## 2017-04-17 DIAGNOSIS — R32 Unspecified urinary incontinence: Secondary | ICD-10-CM | POA: Diagnosis not present

## 2017-04-17 DIAGNOSIS — M6281 Muscle weakness (generalized): Secondary | ICD-10-CM | POA: Diagnosis not present

## 2017-04-22 ENCOUNTER — Other Ambulatory Visit (HOSPITAL_BASED_OUTPATIENT_CLINIC_OR_DEPARTMENT_OTHER): Payer: Medicare Other

## 2017-04-22 ENCOUNTER — Telehealth: Payer: Self-pay | Admitting: Hematology and Oncology

## 2017-04-22 ENCOUNTER — Ambulatory Visit (HOSPITAL_BASED_OUTPATIENT_CLINIC_OR_DEPARTMENT_OTHER): Payer: Medicare Other | Admitting: Hematology and Oncology

## 2017-04-22 VITALS — BP 126/48 | HR 74 | Temp 97.5°F | Resp 18 | Ht 62.0 in | Wt 119.7 lb

## 2017-04-22 DIAGNOSIS — D630 Anemia in neoplastic disease: Secondary | ICD-10-CM

## 2017-04-22 DIAGNOSIS — C8588 Other specified types of non-Hodgkin lymphoma, lymph nodes of multiple sites: Secondary | ICD-10-CM | POA: Diagnosis not present

## 2017-04-22 LAB — COMPREHENSIVE METABOLIC PANEL
ALT: 14 U/L (ref 0–55)
AST: 17 U/L (ref 5–34)
Albumin: 3.4 g/dL — ABNORMAL LOW (ref 3.5–5.0)
Alkaline Phosphatase: 36 U/L — ABNORMAL LOW (ref 40–150)
Anion Gap: 7 mEq/L (ref 3–11)
BUN: 18.8 mg/dL (ref 7.0–26.0)
CO2: 29 mEq/L (ref 22–29)
Calcium: 9.4 mg/dL (ref 8.4–10.4)
Chloride: 105 mEq/L (ref 98–109)
Creatinine: 0.7 mg/dL (ref 0.6–1.1)
EGFR: 75 mL/min/{1.73_m2} — ABNORMAL LOW (ref >90)
Glucose: 82 mg/dl (ref 70–140)
Potassium: 4.1 mEq/L (ref 3.5–5.1)
Sodium: 141 mEq/L (ref 136–145)
Total Bilirubin: 0.86 mg/dL (ref 0.20–1.20)
Total Protein: 7.8 g/dL (ref 6.4–8.3)

## 2017-04-22 LAB — CBC WITH DIFFERENTIAL/PLATELET
BASO%: 0.2 % (ref 0.0–2.0)
Basophils Absolute: 0 10*3/uL (ref 0.0–0.1)
EOS%: 1.7 % (ref 0.0–7.0)
Eosinophils Absolute: 0.1 10*3/uL (ref 0.0–0.5)
HCT: 33.1 % — ABNORMAL LOW (ref 34.8–46.6)
HGB: 10.9 g/dL — ABNORMAL LOW (ref 11.6–15.9)
LYMPH%: 12.2 % — ABNORMAL LOW (ref 14.0–49.7)
MCH: 34.3 pg — ABNORMAL HIGH (ref 25.1–34.0)
MCHC: 32.9 g/dL (ref 31.5–36.0)
MCV: 104.1 fL — ABNORMAL HIGH (ref 79.5–101.0)
MONO#: 0.5 10*3/uL (ref 0.1–0.9)
MONO%: 12 % (ref 0.0–14.0)
NEUT#: 3 10*3/uL (ref 1.5–6.5)
NEUT%: 73.9 % (ref 38.4–76.8)
Platelets: 152 10*3/uL (ref 145–400)
RBC: 3.18 10*6/uL — ABNORMAL LOW (ref 3.70–5.45)
RDW: 14.1 % (ref 11.2–14.5)
WBC: 4 10*3/uL (ref 3.9–10.3)
lymph#: 0.5 10*3/uL — ABNORMAL LOW (ref 0.9–3.3)

## 2017-04-22 NOTE — Telephone Encounter (Signed)
[place] appt pe r7/23 los - Gave patient AVS and calender per los. -

## 2017-04-23 ENCOUNTER — Encounter: Payer: Self-pay | Admitting: Hematology and Oncology

## 2017-04-23 DIAGNOSIS — M6281 Muscle weakness (generalized): Secondary | ICD-10-CM | POA: Diagnosis not present

## 2017-04-23 DIAGNOSIS — R32 Unspecified urinary incontinence: Secondary | ICD-10-CM | POA: Diagnosis not present

## 2017-04-23 DIAGNOSIS — N3946 Mixed incontinence: Secondary | ICD-10-CM | POA: Diagnosis not present

## 2017-04-23 NOTE — Progress Notes (Signed)
[place] OFFICE PROGRESS NOTE  Patient Care Team: Hilbert Bible as PCP - General (Physician Assistant)  SUMMARY OF ONCOLOGIC HISTORY:   Lymphoma, marginal zone, lymph nodes of multiple sites [facility])   10/26/2011 Initial Diagnosis    The patient was diagnosed with marginal zone lymphoma/lymphoplasmacytic lymphoma in [place].  She received single agent rituximab,  weekly 4, completed by May 2013.  She was subsequently found to have disease relapse, and was treated with single agent bendamustine from January 2014 to June 2014, 70 mg/m in [place]      05/12/2013 Imaging    Outside CT showed no evidence of disease      11/18/2014 Imaging    Outside CT scan show no evidence of disease      11/03/2015 Pathology Results    Peripheral Blood Flow Cytometry - MONOCLONAL B CELL POPULATION IDENTIFIED. - SEE COMMENT. Diagnosis Comment: The majority of lymphocytes consist of a monoclonal population of B cells expressing pan B cell antigens including CD20 with associated expression of CD5, CD25, CD11c, and FMC7. No CD10 or CD103 expression is identified. The peripheral blood shows a population of atypical lymphocytes ranging from small to large cells with scanty to moderately abundant cytoplasm, dense to partially clumped chromatin and small to inconspicuous nucleoli. The findings are consistent with a B-cell lymphoproliferative process. Consideration should be given to chronic lymphocytic leukemia (variant or transformation), mantle cell lymphoma and other B cell lymphomas with rare CD5 expression. FISH studies may be helpful in this regard      01/04/2017 PET scan    1. Mildly enlarged retroperitoneal lymph nodes are present including a 1.4 cm node anterior to the aortic bifurcation which demonstrates low-grade metabolic activity with maximum SUV 2.5. This would be considered Deauville 3 activity. 2. Splenomegaly with splenic volume of 540 cc, but no focal splenic lesions  seen. 3. Other imaging findings of potential clinical significance: Considerable atherosclerosis. Bilateral nonobstructive nephrolithiasis. Extensive sigmoid colon diverticulosis. Pelvic floor laxity. Right shoulder prosthesis with activity along the joint margins likely inflammatory. Old pelvic fractures with sclerotic late phase healing response.       INTERVAL HISTORY: Please see below for problem oriented charting. She returns for further follow-up She is feeling better Her weight is stable Her appetite has improved She has no new lymphadenopathy Denies recent infection, fever or chills.  REVIEW OF SYSTEMS:   Constitutional: Denies fevers, chills or abnormal weight loss Eyes: Denies blurriness of vision Ears, nose, mouth, throat, and face: Denies mucositis or sore throat Respiratory: Denies cough, dyspnea or wheezes Cardiovascular: Denies palpitation, chest discomfort or lower extremity swelling Gastrointestinal:  Denies nausea, heartburn or change in bowel habits Skin: Denies abnormal skin rashes Lymphatics: Denies new lymphadenopathy or easy bruising Neurological:Denies numbness, tingling or new weaknesses Behavioral/Psych: Mood is stable, no new changes  All other systems were reviewed with the patient and are negative.  I have reviewed the past medical history, past surgical history, social history and family history with the patient and they are unchanged from previous note.  ALLERGIES:  is allergic to penicillins; tegretol [carbamazepine]; and lyrica [pregabalin].  MEDICATIONS:  Current Outpatient Prescriptions  Medication Sig Dispense Refill  . acyclovir (ZOVIRAX) 400 MG tablet Take 1 tablet (400 mg total) by mouth daily. (Patient not taking: Reported on 03/06/2017) 30 tablet 3  . alendronate (FOSAMAX) 70 MG tablet Take 70 mg by mouth once a week.  2  . allopurinol (ZYLOPRIM) 300 MG tablet Take 1 tablet (300 mg  total) by mouth daily. (Patient not taking: Reported on  03/06/2017) 30 tablet 3  . aspirin 81 MG tablet Take 81 mg by mouth daily.    . calcium-vitamin D (OSCAL WITH D) 500-200 MG-UNIT tablet Take 1 tablet by mouth daily.     . fluticasone (FLONASE) 50 MCG/ACT nasal spray     . gabapentin (NEURONTIN) 300 MG capsule Take 300 mg by mouth 3 (three) times daily as needed.    Marland Kitchen levothyroxine (SYNTHROID, LEVOTHROID) 75 MCG tablet Take 75 mcg by mouth daily. Six days per week  1  . Omega-3 Fatty Acids (FISH OIL PO) Take by mouth 2 (two) times daily.    . propranolol (INDERAL) 10 MG tablet Take 10 mg by mouth daily.    Marland Kitchen sulfamethoxazole-trimethoprim (BACTRIM DS,SEPTRA DS) 800-160 MG tablet      No current facility-administered medications for this visit.     PHYSICAL EXAMINATION: ECOG PERFORMANCE STATUS: 1 - Symptomatic but completely ambulatory  Vitals:   04/22/17 1213  BP: (!) 126/48  Pulse: 74  Resp: 18  Temp: (!) 97.5 F (36.4 C)   Filed Weights   04/22/17 1213  Weight: 119 lb 11.2 oz (54.3 kg)    GENERAL:alert, no distress and comfortable.  She looks thin, cachectic and frail SKIN: skin color, texture, turgor are normal, no rashes or significant lesions EYES: normal, Conjunctiva are pink and non-injected, sclera clear OROPHARYNX:no exudate, no erythema and lips, buccal mucosa, and tongue normal  NECK: supple, thyroid normal size, non-tender, without nodularity LYMPH:  no palpable lymphadenopathy in the cervical, axillary or inguinal LUNGS: clear to auscultation and percussion with normal breathing effort HEART: regular rate & rhythm and no murmurs and no lower extremity edema ABDOMEN:abdomen soft, non-tender and normal bowel sounds Musculoskeletal:no cyanosis of digits and no clubbing  NEURO: alert & oriented x 3 with fluent speech, no focal motor/sensory deficits  LABORATORY DATA:  I have reviewed the data as listed    Component Value Date/Time   NA 141 04/22/2017 1127   K 4.1 04/22/2017 1127   CL 102 02/18/2017 1150   CO2 29  04/22/2017 1127   GLUCOSE 82 04/22/2017 1127   BUN 18.8 04/22/2017 1127   CREATININE 0.7 04/22/2017 1127   CALCIUM 9.4 04/22/2017 1127   PROT 7.8 04/22/2017 1127   ALBUMIN 3.4 (L) 04/22/2017 1127   AST 17 04/22/2017 1127   ALT 14 04/22/2017 1127   ALKPHOS 36 (L) 04/22/2017 1127   BILITOT 0.86 04/22/2017 1127   GFRNONAA >60 02/18/2017 1150   GFRAA >60 02/18/2017 1150    No results found for: SPEP, UPEP  Lab Results  Component Value Date   WBC 4.0 04/22/2017   NEUTROABS 3.0 04/22/2017   HGB 10.9 (L) 04/22/2017   HCT 33.1 (L) 04/22/2017   MCV 104.1 (H) 04/22/2017   PLT 152 04/22/2017      Chemistry      Component Value Date/Time   NA 141 04/22/2017 1127   K 4.1 04/22/2017 1127   CL 102 02/18/2017 1150   CO2 29 04/22/2017 1127   BUN 18.8 04/22/2017 1127   CREATININE 0.7 04/22/2017 1127      Component Value Date/Time   CALCIUM 9.4 04/22/2017 1127   ALKPHOS 36 (L) 04/22/2017 1127   AST 17 04/22/2017 1127   ALT 14 04/22/2017 1127   BILITOT 0.86 04/22/2017 1127       ASSESSMENT & PLAN:  Lymphoma, marginal zone, lymph nodes of multiple sites [facility]) The patient tolerated chemotherapy  very poorly with profound side effects She is recovering well now after 2 months away from treatment Her CBC is improving and her nutritional status is improving I am doubtful she can tolerate further treatment I recommend observation only at this point and repeat imaging study in the month to assess disease status We also discussed briefly about potential treatment with immunotherapy only  Anemia in neoplastic disease This is likely anemia of chronic disease. The patient denies recent history of bleeding such as epistaxis, hematuria or hematochezia. She is asymptomatic from the anemia. We will observe for now.     Orders Placed This Encounter  Procedures  . NM PET Image Restag (PS) Skull Base To Thigh    Standing Status:   Future    Standing Expiration Date:   06/22/2018    Order  Specific Question:   Reason for Exam (SYMPTOM  OR DIAGNOSIS REQUIRED)    Answer:   staging lymphoma    Order Specific Question:   Preferred imaging location?    Answer:   [facility]   All questions were answered. The patient knows to call the clinic with any problems, questions or concerns. No barriers to learning was detected. I spent 15 minutes counseling the patient face to face. The total time spent in the appointment was 20 minutes and more than 50% was on counseling and review of test results     Heath Lark, MD 04/23/2017 6:50 AM

## 2017-04-23 NOTE — Assessment & Plan Note (Signed)
This is likely anemia of chronic disease. The patient denies recent history of bleeding such as epistaxis, hematuria or hematochezia. She is asymptomatic from the anemia. We will observe for now.  

## 2017-04-23 NOTE — Assessment & Plan Note (Signed)
The patient tolerated chemotherapy very poorly with profound side effects She is recovering well now after 2 months away from treatment Her CBC is improving and her nutritional status is improving I am doubtful she can tolerate further treatment I recommend observation only at this point and repeat imaging study in the month to assess disease status We also discussed briefly about potential treatment with immunotherapy only

## 2017-04-24 DIAGNOSIS — M6281 Muscle weakness (generalized): Secondary | ICD-10-CM | POA: Diagnosis not present

## 2017-04-24 DIAGNOSIS — R32 Unspecified urinary incontinence: Secondary | ICD-10-CM | POA: Diagnosis not present

## 2017-04-24 DIAGNOSIS — N3946 Mixed incontinence: Secondary | ICD-10-CM | POA: Diagnosis not present

## 2017-04-29 DIAGNOSIS — L309 Dermatitis, unspecified: Secondary | ICD-10-CM | POA: Diagnosis not present

## 2017-04-30 DIAGNOSIS — G500 Trigeminal neuralgia: Secondary | ICD-10-CM | POA: Diagnosis not present

## 2017-04-30 DIAGNOSIS — E039 Hypothyroidism, unspecified: Secondary | ICD-10-CM | POA: Diagnosis not present

## 2017-04-30 DIAGNOSIS — C9110 Chronic lymphocytic leukemia of B-cell type not having achieved remission: Secondary | ICD-10-CM | POA: Diagnosis not present

## 2017-04-30 DIAGNOSIS — G43909 Migraine, unspecified, not intractable, without status migrainosus: Secondary | ICD-10-CM | POA: Diagnosis not present

## 2017-04-30 DIAGNOSIS — M2170 Unequal limb length (acquired), unspecified site: Secondary | ICD-10-CM | POA: Diagnosis not present

## 2017-04-30 DIAGNOSIS — M810 Age-related osteoporosis without current pathological fracture: Secondary | ICD-10-CM | POA: Diagnosis not present

## 2017-05-01 DIAGNOSIS — R293 Abnormal posture: Secondary | ICD-10-CM | POA: Diagnosis not present

## 2017-05-01 DIAGNOSIS — M6281 Muscle weakness (generalized): Secondary | ICD-10-CM | POA: Diagnosis not present

## 2017-05-03 DIAGNOSIS — R293 Abnormal posture: Secondary | ICD-10-CM | POA: Diagnosis not present

## 2017-05-03 DIAGNOSIS — M6281 Muscle weakness (generalized): Secondary | ICD-10-CM | POA: Diagnosis not present

## 2017-05-06 DIAGNOSIS — R293 Abnormal posture: Secondary | ICD-10-CM | POA: Diagnosis not present

## 2017-05-06 DIAGNOSIS — M6281 Muscle weakness (generalized): Secondary | ICD-10-CM | POA: Diagnosis not present

## 2017-05-08 DIAGNOSIS — M6281 Muscle weakness (generalized): Secondary | ICD-10-CM | POA: Diagnosis not present

## 2017-05-08 DIAGNOSIS — R293 Abnormal posture: Secondary | ICD-10-CM | POA: Diagnosis not present

## 2017-05-13 DIAGNOSIS — R293 Abnormal posture: Secondary | ICD-10-CM | POA: Diagnosis not present

## 2017-05-13 DIAGNOSIS — M6281 Muscle weakness (generalized): Secondary | ICD-10-CM | POA: Diagnosis not present

## 2017-05-15 DIAGNOSIS — R293 Abnormal posture: Secondary | ICD-10-CM | POA: Diagnosis not present

## 2017-05-15 DIAGNOSIS — M6281 Muscle weakness (generalized): Secondary | ICD-10-CM | POA: Diagnosis not present

## 2017-05-21 ENCOUNTER — Other Ambulatory Visit (HOSPITAL_BASED_OUTPATIENT_CLINIC_OR_DEPARTMENT_OTHER): Payer: Medicare Other

## 2017-05-21 ENCOUNTER — Ambulatory Visit (HOSPITAL_COMMUNITY)
Admission: RE | Admit: 2017-05-21 | Discharge: 2017-05-21 | Disposition: A | Discharge status: Home / Self Care | Payer: Medicare Other | Source: Ambulatory Visit | Attending: Hematology and Oncology | Admitting: Hematology and Oncology

## 2017-05-21 DIAGNOSIS — C8588 Other specified types of non-Hodgkin lymphoma, lymph nodes of multiple sites: Secondary | ICD-10-CM | POA: Insufficient documentation

## 2017-05-21 DIAGNOSIS — N200 Calculus of kidney: Secondary | ICD-10-CM | POA: Diagnosis not present

## 2017-05-21 DIAGNOSIS — C8580 Other specified types of non-Hodgkin lymphoma, unspecified site: Secondary | ICD-10-CM | POA: Diagnosis not present

## 2017-05-21 DIAGNOSIS — K8020 Calculus of gallbladder without cholecystitis without obstruction: Secondary | ICD-10-CM | POA: Diagnosis not present

## 2017-05-21 LAB — COMPREHENSIVE METABOLIC PANEL
ALT: 11 U/L (ref 0–55)
AST: 19 U/L (ref 5–34)
Albumin: 3.4 g/dL — ABNORMAL LOW (ref 3.5–5.0)
Alkaline Phosphatase: 40 U/L (ref 40–150)
Anion Gap: 7 mEq/L (ref 3–11)
BUN: 27.1 mg/dL — ABNORMAL HIGH (ref 7.0–26.0)
CO2: 29 mEq/L (ref 22–29)
Calcium: 9.9 mg/dL (ref 8.4–10.4)
Chloride: 104 mEq/L (ref 98–109)
Creatinine: 0.8 mg/dL (ref 0.6–1.1)
EGFR: 67 mL/min/{1.73_m2} — ABNORMAL LOW (ref >90)
Glucose: 89 mg/dl (ref 70–140)
Potassium: 4.7 mEq/L (ref 3.5–5.1)
Sodium: 140 mEq/L (ref 136–145)
Total Bilirubin: 0.92 mg/dL (ref 0.20–1.20)
Total Protein: 7.6 g/dL (ref 6.4–8.3)

## 2017-05-21 LAB — CBC WITH DIFFERENTIAL/PLATELET
BASO%: 0.3 % (ref 0.0–2.0)
Basophils Absolute: 0 10*3/uL (ref 0.0–0.1)
EOS%: 2 % (ref 0.0–7.0)
Eosinophils Absolute: 0.1 10*3/uL (ref 0.0–0.5)
HCT: 34 % — ABNORMAL LOW (ref 34.8–46.6)
HGB: 11.7 g/dL (ref 11.6–15.9)
LYMPH%: 12.7 % — ABNORMAL LOW (ref 14.0–49.7)
MCH: 35 pg — ABNORMAL HIGH (ref 25.1–34.0)
MCHC: 34.3 g/dL (ref 31.5–36.0)
MCV: 102 fL — ABNORMAL HIGH (ref 79.5–101.0)
MONO#: 0.4 10*3/uL (ref 0.1–0.9)
MONO%: 8.2 % (ref 0.0–14.0)
NEUT#: 4 10*3/uL (ref 1.5–6.5)
NEUT%: 76.8 % (ref 38.4–76.8)
Platelets: 168 10*3/uL (ref 145–400)
RBC: 3.33 10*6/uL — ABNORMAL LOW (ref 3.70–5.45)
RDW: 12.7 % (ref 11.2–14.5)
WBC: 5.2 10*3/uL (ref 3.9–10.3)
lymph#: 0.7 10*3/uL — ABNORMAL LOW (ref 0.9–3.3)

## 2017-05-21 LAB — GLUCOSE, CAPILLARY: Glucose-Capillary: 86 mg/dL (ref 65–99)

## 2017-05-21 MED ORDER — FLUDEOXYGLUCOSE F - 18 (FDG) INJECTION
6.8000 | Freq: Once | INTRAVENOUS | Status: AC | PRN
  Administered 2017-05-21: 6.8 via INTRAVENOUS

## 2017-05-22 DIAGNOSIS — R293 Abnormal posture: Secondary | ICD-10-CM | POA: Diagnosis not present

## 2017-05-22 DIAGNOSIS — M6281 Muscle weakness (generalized): Secondary | ICD-10-CM | POA: Diagnosis not present

## 2017-05-23 ENCOUNTER — Ambulatory Visit (HOSPITAL_BASED_OUTPATIENT_CLINIC_OR_DEPARTMENT_OTHER): Payer: Medicare Other | Admitting: Hematology and Oncology

## 2017-05-23 ENCOUNTER — Telehealth: Payer: Self-pay | Admitting: Hematology and Oncology

## 2017-05-23 DIAGNOSIS — M25511 Pain in right shoulder: Secondary | ICD-10-CM | POA: Diagnosis not present

## 2017-05-23 DIAGNOSIS — C884 Extranodal marginal zone B-cell lymphoma of mucosa-associated lymphoid tissue [MALT-lymphoma]: Secondary | ICD-10-CM

## 2017-05-23 DIAGNOSIS — D61818 Other pancytopenia: Secondary | ICD-10-CM

## 2017-05-23 DIAGNOSIS — G8929 Other chronic pain: Secondary | ICD-10-CM

## 2017-05-23 DIAGNOSIS — C8588 Other specified types of non-Hodgkin lymphoma, lymph nodes of multiple sites: Secondary | ICD-10-CM

## 2017-05-23 NOTE — Telephone Encounter (Signed)
Gave patient avs and calendar for upcoming appts.  °

## 2017-05-24 ENCOUNTER — Encounter: Payer: Self-pay | Admitting: Hematology and Oncology

## 2017-05-24 DIAGNOSIS — R293 Abnormal posture: Secondary | ICD-10-CM | POA: Diagnosis not present

## 2017-05-24 DIAGNOSIS — M6281 Muscle weakness (generalized): Secondary | ICD-10-CM | POA: Diagnosis not present

## 2017-05-24 DIAGNOSIS — M25511 Pain in right shoulder: Secondary | ICD-10-CM | POA: Insufficient documentation

## 2017-05-24 IMAGING — CR DG CHEST 2V
2 series · 2 of 2 positions shown · non-contrast
Comparison: PET-CT 01/04/2017

CLINICAL DATA: Weakness.  Chemotherapy.

EXAM:
CHEST  2 VIEW

[w chest lat]
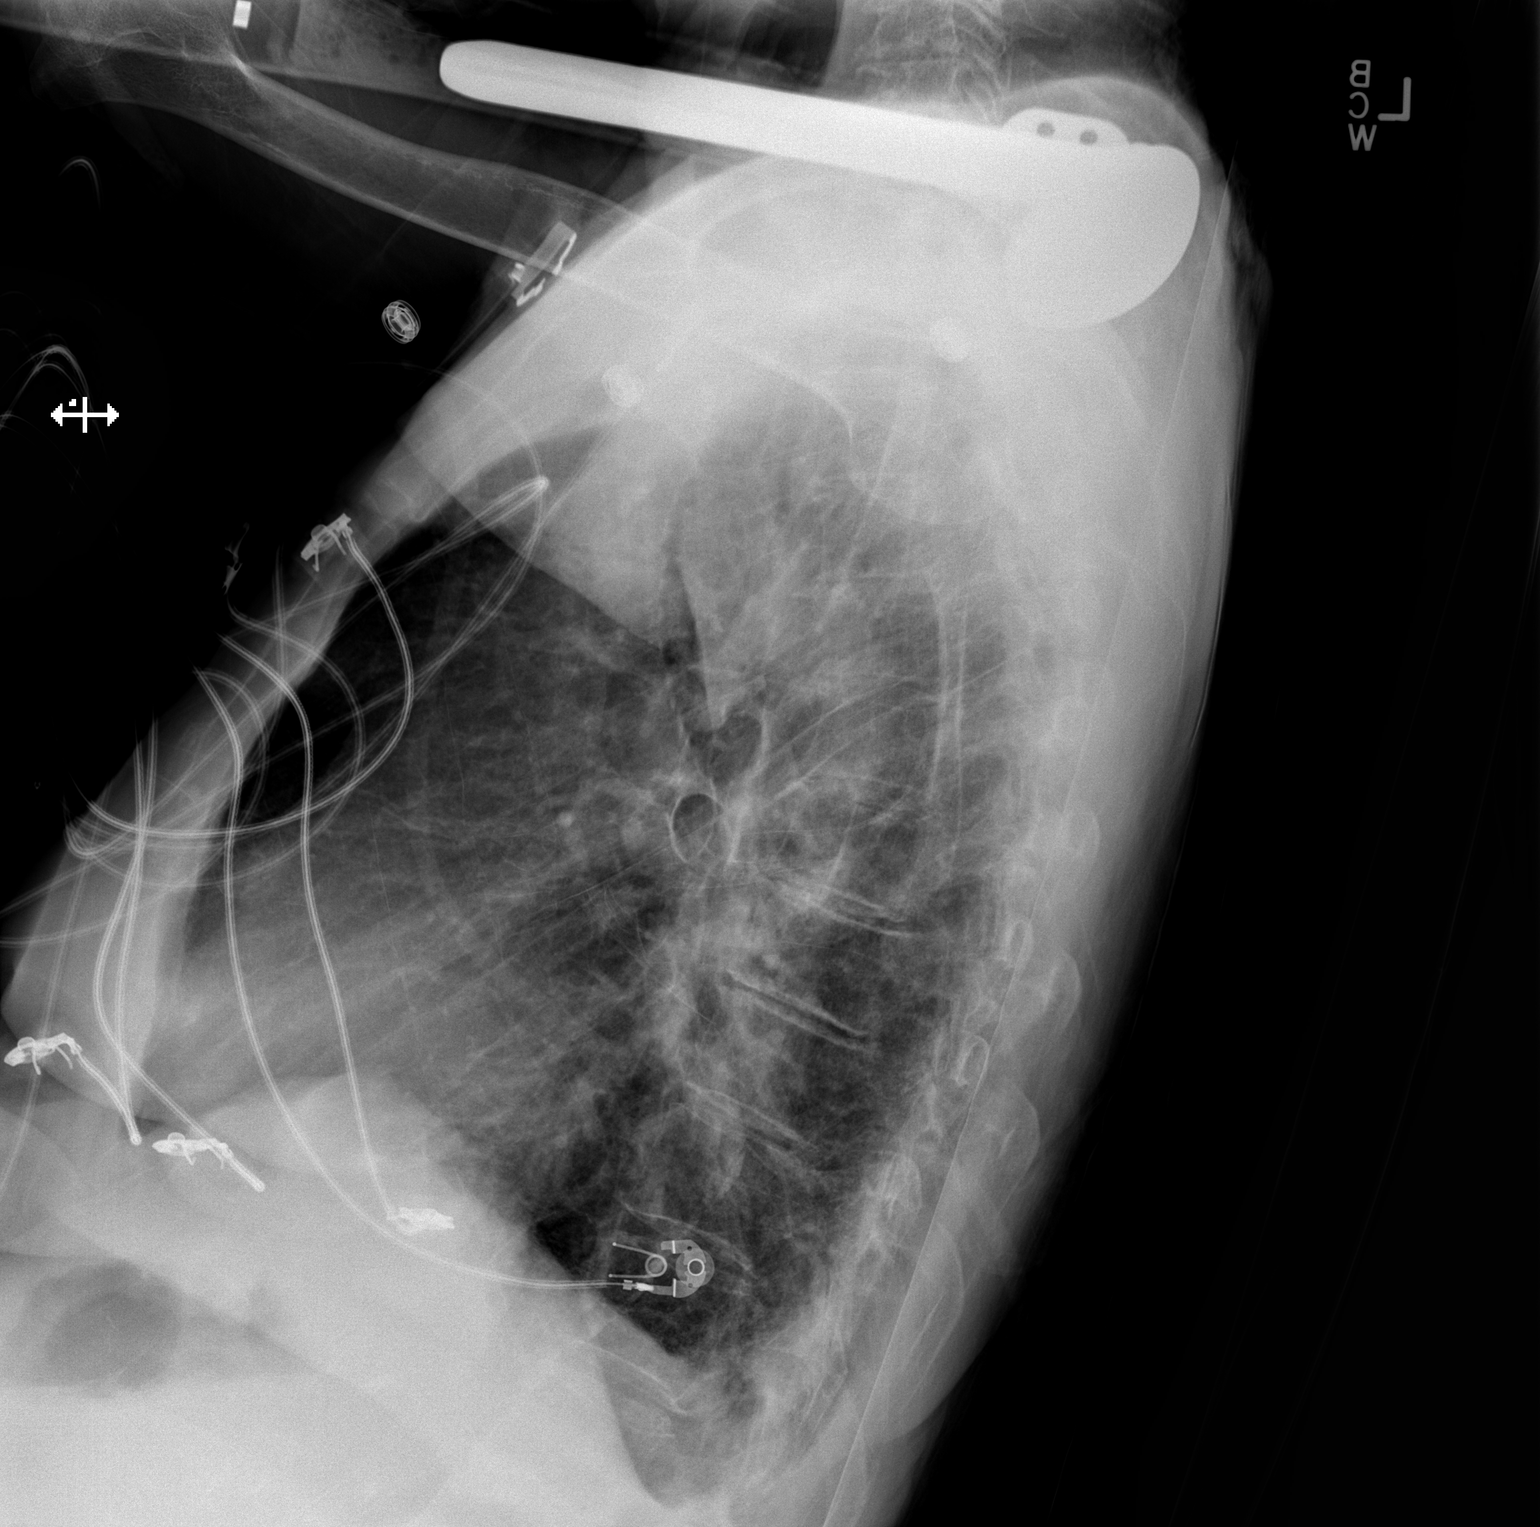

[x chest ap]
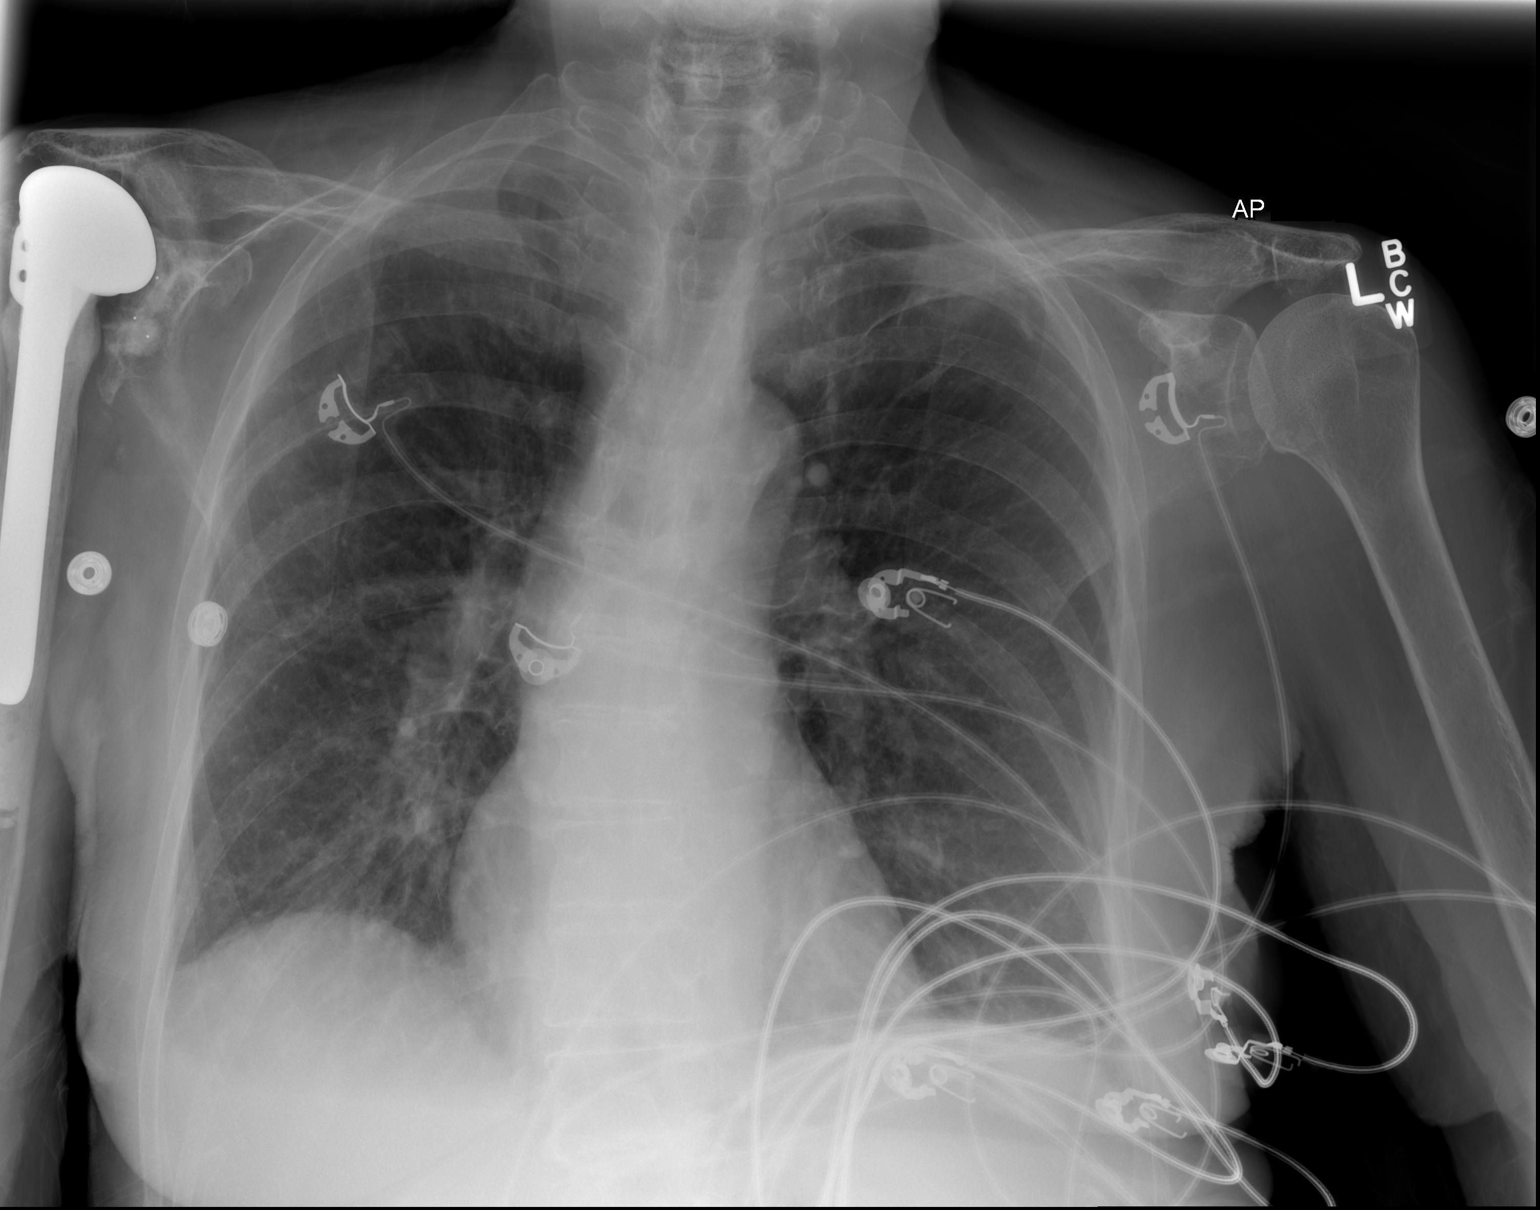

[2 of 2 positions shown; findings below may reference images not displayed]

FINDINGS: Trace pleural effusions. No air bronchogram or pulmonary edema.
Normal heart size and mediastinal contours. Subtle density
overlapping the left clavicle is favored secondary to the pleural
scarring seen on the recent PET-CT. Glenohumeral arthroplasty on the
right with chronic malalignment.
IMPRESSION: Trace pleural effusions.  No evidence of pneumonia or edema.

## 2017-05-24 NOTE — Assessment & Plan Note (Signed)
She has complete response to treatment even though she only barely finished 1 cycle of therapy I recommend close observation with repeat history, physical examination and blood count only Hopefully, I would not have to retreat her again in the future

## 2017-05-24 NOTE — Assessment & Plan Note (Signed)
She has chronic shoulder pain Examination is benign I think the abnormalities seen on PET CT scan likely reflect chronic arthritis Observe only

## 2017-05-24 NOTE — Progress Notes (Signed)
[name] OFFICE PROGRESS NOTE  Patient Care Team: Hilbert Bible as PCP - General (Physician Assistant)  SUMMARY OF ONCOLOGIC HISTORY:   Lymphoma, marginal zone, lymph nodes of multiple sites [facility])   10/26/2011 Initial Diagnosis    The patient was diagnosed with marginal zone lymphoma/lymphoplasmacytic lymphoma in [place].  She received single agent rituximab,  weekly 4, completed by May 2013.  She was subsequently found to have disease relapse, and was treated with single agent bendamustine from January 2014 to June 2014, 70 mg/m in [place]      05/12/2013 Imaging    Outside CT showed no evidence of disease      11/18/2014 Imaging    Outside CT scan show no evidence of disease      11/03/2015 Pathology Results    Peripheral Blood Flow Cytometry - MONOCLONAL B CELL POPULATION IDENTIFIED. - SEE COMMENT. Diagnosis Comment: The majority of lymphocytes consist of a monoclonal population of B cells expressing pan B cell antigens including CD20 with associated expression of CD5, CD25, CD11c, and FMC7. No CD10 or CD103 expression is identified. The peripheral blood shows a population of atypical lymphocytes ranging from small to large cells with scanty to moderately abundant cytoplasm, dense to partially clumped chromatin and small to inconspicuous nucleoli. The findings are consistent with a B-cell lymphoproliferative process. Consideration should be given to chronic lymphocytic leukemia (variant or transformation), mantle cell lymphoma and other B cell lymphomas with rare CD5 expression. FISH studies may be helpful in this regard      01/04/2017 PET scan    1. Mildly enlarged retroperitoneal lymph nodes are present including a 1.4 cm node anterior to the aortic bifurcation which demonstrates low-grade metabolic activity with maximum SUV 2.5. This would be considered Deauville 3 activity. 2. Splenomegaly with splenic volume of 540 cc, but no focal splenic lesions  seen. 3. Other imaging findings of potential clinical significance: Considerable atherosclerosis. Bilateral nonobstructive nephrolithiasis. Extensive sigmoid colon diverticulosis. Pelvic floor laxity. Right shoulder prosthesis with activity along the joint margins likely inflammatory. Old pelvic fractures with sclerotic late phase healing response.      02/06/2017 - 02/07/2017 Chemotherapy    She only received 1 cycle of treatment. Treatment was stopped due to very poor tolerance      05/21/2017 PET scan    1. Response to therapy of abdominal retroperitoneal nodes. Resolution of splenomegaly. 2. No new or progressive disease identified. 3. Hypermetabolism about the lateral right chest wall is favored to be artifactual, adjacent to right shoulder arthroplasty. Consider physical exam correlation. Similarly, right paraspinous mild hypermetabolism within the chest is favored to be due to motion after pharmaceutical injection or possibly prior muscular strain. 4. Incidental findings, including cholelithiasis and right nephrolithiasis.       INTERVAL HISTORY: Please see below for problem oriented charting. She returns to review test results Overall, she feels fine She denies recent new infection No new lymphadenopathy Her appetite is great She has excellent energy level.  REVIEW OF SYSTEMS:   Constitutional: Denies fevers, chills or abnormal weight loss Eyes: Denies blurriness of vision Ears, nose, mouth, throat, and face: Denies mucositis or sore throat Respiratory: Denies cough, dyspnea or wheezes Cardiovascular: Denies palpitation, chest discomfort or lower extremity swelling Gastrointestinal:  Denies nausea, heartburn or change in bowel habits Skin: Denies abnormal skin rashes Lymphatics: Denies new lymphadenopathy or easy bruising Neurological:Denies numbness, tingling or new weaknesses Behavioral/Psych: Mood is stable, no new changes  All other systems were reviewed with  the patient  and are negative.  I have reviewed the past medical history, past surgical history, social history and family history with the patient and they are unchanged from previous note.  ALLERGIES:  is allergic to penicillins; tegretol [carbamazepine]; and lyrica [pregabalin].  MEDICATIONS:  Current Outpatient Prescriptions  Medication Sig Dispense Refill  . acyclovir (ZOVIRAX) 400 MG tablet Take 1 tablet (400 mg total) by mouth daily. (Patient not taking: Reported on 03/06/2017) 30 tablet 3  . alendronate (FOSAMAX) 70 MG tablet Take 70 mg by mouth once a week.  2  . allopurinol (ZYLOPRIM) 300 MG tablet Take 1 tablet (300 mg total) by mouth daily. (Patient not taking: Reported on 03/06/2017) 30 tablet 3  . aspirin 81 MG tablet Take 81 mg by mouth daily.    . calcium-vitamin D (OSCAL WITH D) 500-200 MG-UNIT tablet Take 1 tablet by mouth daily.     . fluticasone (FLONASE) 50 MCG/ACT nasal spray     . gabapentin (NEURONTIN) 300 MG capsule Take 300 mg by mouth 3 (three) times daily as needed.    Marland Kitchen levothyroxine (SYNTHROID, LEVOTHROID) 75 MCG tablet Take 75 mcg by mouth daily. Six days per week  1  . Omega-3 Fatty Acids (FISH OIL PO) Take by mouth 2 (two) times daily.    . propranolol (INDERAL) 10 MG tablet Take 10 mg by mouth daily.    Marland Kitchen sulfamethoxazole-trimethoprim (BACTRIM DS,SEPTRA DS) 800-160 MG tablet      No current facility-administered medications for this visit.     PHYSICAL EXAMINATION: ECOG PERFORMANCE STATUS: 0 - Asymptomatic  Vitals:   05/23/17 1425  BP: (!) 121/50  Pulse: 75  Resp: 17  Temp: 98.1 F (36.7 C)  SpO2: 98%   Filed Weights   05/23/17 1425  Weight: 119 lb 14.4 oz (54.4 kg)    GENERAL:alert, no distress and comfortable SKIN: skin color, texture, turgor are normal, no rashes or significant lesions EYES: normal, Conjunctiva are pink and non-injected, sclera clear Musculoskeletal:no cyanosis of digits and no clubbing  NEURO: alert & oriented x 3 with fluent speech,  no focal motor/sensory deficits  LABORATORY DATA:  I have reviewed the data as listed    Component Value Date/Time   NA 140 05/21/2017 1303   K 4.7 05/21/2017 1303   CL 102 02/18/2017 1150   CO2 29 05/21/2017 1303   GLUCOSE 89 05/21/2017 1303   BUN 27.1 (H) 05/21/2017 1303   CREATININE 0.8 05/21/2017 1303   CALCIUM 9.9 05/21/2017 1303   PROT 7.6 05/21/2017 1303   ALBUMIN 3.4 (L) 05/21/2017 1303   AST 19 05/21/2017 1303   ALT 11 05/21/2017 1303   ALKPHOS 40 05/21/2017 1303   BILITOT 0.92 05/21/2017 1303   GFRNONAA >60 02/18/2017 1150   GFRAA >60 02/18/2017 1150    No results found for: SPEP, UPEP  Lab Results  Component Value Date   WBC 5.2 05/21/2017   NEUTROABS 4.0 05/21/2017   HGB 11.7 05/21/2017   HCT 34.0 (L) 05/21/2017   MCV 102.0 (H) 05/21/2017   PLT 168 05/21/2017      Chemistry      Component Value Date/Time   NA 140 05/21/2017 1303   K 4.7 05/21/2017 1303   CL 102 02/18/2017 1150   CO2 29 05/21/2017 1303   BUN 27.1 (H) 05/21/2017 1303   CREATININE 0.8 05/21/2017 1303      Component Value Date/Time   CALCIUM 9.9 05/21/2017 1303   ALKPHOS 40 05/21/2017 1303   AST  19 05/21/2017 1303   ALT 11 05/21/2017 1303   BILITOT 0.92 05/21/2017 1303       RADIOGRAPHIC STUDIES: I have personally reviewed the radiological images as listed and agreed with the findings in the report. Nm Pet Image Restag (ps) Skull Base To Thigh  Result Date: 05/21/2017 CLINICAL DATA:  Subsequent treatment strategy for restaging of lymphoma. EXAM: NUCLEAR MEDICINE PET SKULL BASE TO THIGH TECHNIQUE: Six point a mCi F-18 FDG was injected intravenously. Full-ring PET imaging was performed from the skull base to thigh after the radiotracer. CT data was obtained and used for attenuation correction and anatomic localization. FASTING BLOOD GLUCOSE:  Value: 86 mg/dl COMPARISON:  01/04/2017 FINDINGS: NECK: No areas of abnormal hypermetabolism. No cervical adenopathy. Bilateral carotid  atherosclerosis. CHEST: No pulmonary parenchymal or nodal hypermetabolism. Mild cardiomegaly with multivessel coronary artery and aortic atherosclerosis. Similar trace right pleural fluid or thickening. ABDOMEN/PELVIS: Hypermetabolic low retroperitoneal node measures 1.0 cm and a S.U.V. max of 1.8 on image 134/ series 4. Compare 1.4 cm and a S.U.V. max of 2.5 on the prior. No new hypermetabolic abdominopelvic nodes. Similar decrease in size of other smaller abdominal retroperitoneal nodes. Abdominal aortic atherosclerosis. Right hepatic lobe cyst. Small gallstones. Resolved splenomegaly, maximal transverse dimensions today at 8.7 x 4.9 cm versus 12.6 x 5.4 on the prior. Approximately 11 cm craniocaudal. Lower pole right renal collecting system calculus of 9 mm. Hysterectomy. Pelvic floor laxity. SKELETON: Right glenohumeral joint hypermetabolism is likely reactive and related to prior arthroplasty. There is a focus of skin or subcutaneous hypermetabolism about the adjacent lateral right chest wall which measures a S.U.V. max of 2.4 on approximately image 69/series 4. Favored to be artifactual. Mild low-level hypermetabolism about the right paraspinous region of the upper chest is without CT correlate. IMPRESSION: 1. Response to therapy of abdominal retroperitoneal nodes. Resolution of splenomegaly. 2. No new or progressive disease identified. 3. Hypermetabolism about the lateral right chest wall is favored to be artifactual, adjacent to right shoulder arthroplasty. Consider physical exam correlation. Similarly, right paraspinous mild hypermetabolism within the chest is favored to be due to motion after pharmaceutical injection or possibly prior muscular strain. 4. Incidental findings, including cholelithiasis and right nephrolithiasis. Electronically Signed   By: Abigail Miyamoto M.D.   On: 05/21/2017 16:28    ASSESSMENT & PLAN:  Lymphoma, marginal zone, lymph nodes of multiple sites [facility]) She has complete response  to treatment even though she only barely finished 1 cycle of therapy I recommend close observation with repeat history, physical examination and blood count only Hopefully, I would not have to retreat her again in the future  Pancytopenia, acquired [facility]) Her pancytopenia has resolved since discontinuation of treatment PET CT scan confirmed complete response to treatment We will watch closely  Right shoulder pain She has chronic shoulder pain Examination is benign I think the abnormalities seen on PET CT scan likely reflect chronic arthritis Observe only   No orders of the defined types were placed in this encounter.  All questions were answered. The patient knows to call the clinic with any problems, questions or concerns. No barriers to learning was detected. I spent 15 minutes counseling the patient face to face. The total time spent in the appointment was 20 minutes and more than 50% was on counseling and review of test results     Heath Lark, MD 05/24/2017 8:26 AM

## 2017-05-24 NOTE — Assessment & Plan Note (Signed)
Her pancytopenia has resolved since discontinuation of treatment PET CT scan confirmed complete response to treatment We will watch closely

## 2017-06-11 DIAGNOSIS — Z23 Encounter for immunization: Secondary | ICD-10-CM | POA: Diagnosis not present

## 2017-07-24 DIAGNOSIS — J3089 Other allergic rhinitis: Secondary | ICD-10-CM | POA: Diagnosis not present

## 2017-08-13 DIAGNOSIS — Z23 Encounter for immunization: Secondary | ICD-10-CM | POA: Diagnosis not present

## 2017-08-13 DIAGNOSIS — J3089 Other allergic rhinitis: Secondary | ICD-10-CM | POA: Diagnosis not present

## 2017-08-13 DIAGNOSIS — Z136 Encounter for screening for cardiovascular disorders: Secondary | ICD-10-CM | POA: Diagnosis not present

## 2017-08-13 DIAGNOSIS — C9110 Chronic lymphocytic leukemia of B-cell type not having achieved remission: Secondary | ICD-10-CM | POA: Diagnosis not present

## 2017-08-13 DIAGNOSIS — E039 Hypothyroidism, unspecified: Secondary | ICD-10-CM | POA: Diagnosis not present

## 2017-08-13 DIAGNOSIS — Z0000 Encounter for general adult medical examination without abnormal findings: Secondary | ICD-10-CM | POA: Diagnosis not present

## 2017-08-13 DIAGNOSIS — Z131 Encounter for screening for diabetes mellitus: Secondary | ICD-10-CM | POA: Diagnosis not present

## 2017-08-13 DIAGNOSIS — M810 Age-related osteoporosis without current pathological fracture: Secondary | ICD-10-CM | POA: Diagnosis not present

## 2017-08-16 ENCOUNTER — Ambulatory Visit (HOSPITAL_BASED_OUTPATIENT_CLINIC_OR_DEPARTMENT_OTHER): Payer: Medicare Other | Admitting: Hematology and Oncology

## 2017-08-16 ENCOUNTER — Encounter: Payer: Self-pay | Admitting: Hematology and Oncology

## 2017-08-16 ENCOUNTER — Telehealth: Payer: Self-pay | Admitting: Hematology and Oncology

## 2017-08-16 ENCOUNTER — Other Ambulatory Visit (HOSPITAL_BASED_OUTPATIENT_CLINIC_OR_DEPARTMENT_OTHER): Payer: Medicare Other

## 2017-08-16 ENCOUNTER — Other Ambulatory Visit: Payer: Self-pay | Admitting: Family Medicine

## 2017-08-16 DIAGNOSIS — Z8572 Personal history of non-Hodgkin lymphomas: Secondary | ICD-10-CM

## 2017-08-16 DIAGNOSIS — C8588 Other specified types of non-Hodgkin lymphoma, lymph nodes of multiple sites: Secondary | ICD-10-CM

## 2017-08-16 DIAGNOSIS — R5381 Other malaise: Secondary | ICD-10-CM

## 2017-08-16 DIAGNOSIS — E2839 Other primary ovarian failure: Secondary | ICD-10-CM

## 2017-08-16 LAB — COMPREHENSIVE METABOLIC PANEL
ALT: 11 U/L (ref 0–55)
AST: 18 U/L (ref 5–34)
Albumin: 3.6 g/dL (ref 3.5–5.0)
Alkaline Phosphatase: 42 U/L (ref 40–150)
Anion Gap: 8 mEq/L (ref 3–11)
BUN: 19.6 mg/dL (ref 7.0–26.0)
CO2: 25 mEq/L (ref 22–29)
Calcium: 9.2 mg/dL (ref 8.4–10.4)
Chloride: 105 mEq/L (ref 98–109)
Creatinine: 0.7 mg/dL (ref 0.6–1.1)
EGFR: >60 mL/min/{1.73_m2} (ref >60)
Glucose: 88 mg/dl (ref 70–140)
Potassium: 4.3 mEq/L (ref 3.5–5.1)
Sodium: 138 mEq/L (ref 136–145)
Total Bilirubin: 0.87 mg/dL (ref 0.20–1.20)
Total Protein: 7.5 g/dL (ref 6.4–8.3)

## 2017-08-16 LAB — CBC WITH DIFFERENTIAL/PLATELET
BASO%: 1.6 % (ref 0.0–2.0)
Basophils Absolute: 0.1 10*3/uL (ref 0.0–0.1)
EOS%: 3.5 % (ref 0.0–7.0)
Eosinophils Absolute: 0.1 10*3/uL (ref 0.0–0.5)
HCT: 34.4 % — ABNORMAL LOW (ref 34.8–46.6)
HGB: 11.7 g/dL (ref 11.6–15.9)
LYMPH%: 17.9 % (ref 14.0–49.7)
MCH: 34.1 pg — ABNORMAL HIGH (ref 25.1–34.0)
MCHC: 34.1 g/dL (ref 31.5–36.0)
MCV: 100 fL (ref 79.5–101.0)
MONO#: 0.4 10*3/uL (ref 0.1–0.9)
MONO%: 9.2 % (ref 0.0–14.0)
NEUT#: 2.6 10*3/uL (ref 1.5–6.5)
NEUT%: 67.8 % (ref 38.4–76.8)
Platelets: 148 10*3/uL (ref 145–400)
RBC: 3.44 10*6/uL — ABNORMAL LOW (ref 3.70–5.45)
RDW: 12.8 % (ref 11.2–14.5)
WBC: 3.9 10*3/uL (ref 3.9–10.3)
lymph#: 0.7 10*3/uL — ABNORMAL LOW (ref 0.9–3.3)

## 2017-08-16 NOTE — Telephone Encounter (Signed)
Gave patient avs and calendar with appts per 11/16 los.

## 2017-08-16 NOTE — Progress Notes (Signed)
[facility] OFFICE PROGRESS NOTE  Patient Care Team: Hilbert Bible as PCP - General (Physician Assistant)  SUMMARY OF ONCOLOGIC HISTORY:   Lymphoma, marginal zone, lymph nodes of multiple sites [facility])   10/26/2011 Initial Diagnosis    The patient was diagnosed with marginal zone lymphoma/lymphoplasmacytic lymphoma in [place].  She received single agent rituximab,  weekly 4, completed by May 2013.  She was subsequently found to have disease relapse, and was treated with single agent bendamustine from January 2014 to June 2014, 70 mg/m in [place]      05/12/2013 Imaging    Outside CT showed no evidence of disease      11/18/2014 Imaging    Outside CT scan show no evidence of disease      11/03/2015 Pathology Results    Peripheral Blood Flow Cytometry - MONOCLONAL B CELL POPULATION IDENTIFIED. - SEE COMMENT. Diagnosis Comment: The majority of lymphocytes consist of a monoclonal population of B cells expressing pan B cell antigens including CD20 with associated expression of CD5, CD25, CD11c, and FMC7. No CD10 or CD103 expression is identified. The peripheral blood shows a population of atypical lymphocytes ranging from small to large cells with scanty to moderately abundant cytoplasm, dense to partially clumped chromatin and small to inconspicuous nucleoli. The findings are consistent with a B-cell lymphoproliferative process. Consideration should be given to chronic lymphocytic leukemia (variant or transformation), mantle cell lymphoma and other B cell lymphomas with rare CD5 expression. FISH studies may be helpful in this regard      01/04/2017 PET scan    1. Mildly enlarged retroperitoneal lymph nodes are present including a 1.4 cm node anterior to the aortic bifurcation which demonstrates low-grade metabolic activity with maximum SUV 2.5. This would be considered Deauville 3 activity. 2. Splenomegaly with splenic volume of 540 cc, but no focal splenic lesions  seen. 3. Other imaging findings of potential clinical significance: Considerable atherosclerosis. Bilateral nonobstructive nephrolithiasis. Extensive sigmoid colon diverticulosis. Pelvic floor laxity. Right shoulder prosthesis with activity along the joint margins likely inflammatory. Old pelvic fractures with sclerotic late phase healing response.      02/06/2017 - 02/07/2017 Chemotherapy    She only received 1 cycle of treatment. Treatment was stopped due to very poor tolerance      05/21/2017 PET scan    1. Response to therapy of abdominal retroperitoneal nodes. Resolution of splenomegaly. 2. No new or progressive disease identified. 3. Hypermetabolism about the lateral right chest wall is favored to be artifactual, adjacent to right shoulder arthroplasty. Consider physical exam correlation. Similarly, right paraspinous mild hypermetabolism within the chest is favored to be due to motion after pharmaceutical injection or possibly prior muscular strain. 4. Incidental findings, including cholelithiasis and right nephrolithiasis.       INTERVAL HISTORY: Please see below for problem oriented charting. She returns for further follow-up She feels well No recent infection No new lymphadenopathy Appetite is stable, no recent weight loss or abnormal night sweats  REVIEW OF SYSTEMS:   Constitutional: Denies fevers, chills or abnormal weight loss Eyes: Denies blurriness of vision Ears, nose, mouth, throat, and face: Denies mucositis or sore throat Respiratory: Denies cough, dyspnea or wheezes Cardiovascular: Denies palpitation, chest discomfort or lower extremity swelling Gastrointestinal:  Denies nausea, heartburn or change in bowel habits Skin: Denies abnormal skin rashes Lymphatics: Denies new lymphadenopathy or easy bruising Neurological:Denies numbness, tingling or new weaknesses Behavioral/Psych: Mood is stable, no new changes  All other systems were reviewed with the patient  and are  negative.  I have reviewed the past medical history, past surgical history, social history and family history with the patient and they are unchanged from previous note.  ALLERGIES:  is allergic to penicillins; tegretol [carbamazepine]; and lyrica [pregabalin].  MEDICATIONS:  Current Outpatient Medications  Medication Sig Dispense Refill  . alendronate (FOSAMAX) 70 MG tablet Take 70 mg by mouth once a week.  2  . aspirin 81 MG tablet Take 81 mg by mouth daily.    . calcium-vitamin D (OSCAL WITH D) 500-200 MG-UNIT tablet Take 1 tablet by mouth daily.     . fluticasone (FLONASE) 50 MCG/ACT nasal spray     . gabapentin (NEURONTIN) 400 MG capsule Take 400 mg 3 (three) times daily by mouth.  0  . levothyroxine (SYNTHROID, LEVOTHROID) 75 MCG tablet Take 75 mcg by mouth daily. Six days per week  1  . Omega-3 Fatty Acids (FISH OIL PO) Take by mouth 2 (two) times daily.    . propranolol (INDERAL) 10 MG tablet Take 10 mg by mouth daily.     No current facility-administered medications for this visit.     PHYSICAL EXAMINATION: ECOG PERFORMANCE STATUS: 1 - Symptomatic but completely ambulatory  Vitals:   08/16/17 1321  BP: (!) 123/48  Pulse: 68  Resp: 17  Temp: 97.6 F (36.4 C)  SpO2: 100%   Filed Weights   08/16/17 1321  Weight: 122 lb (55.3 kg)    GENERAL:alert, no distress and comfortable SKIN: skin color, texture, turgor are normal, no rashes or significant lesions EYES: normal, Conjunctiva are pink and non-injected, sclera clear OROPHARYNX:no exudate, no erythema and lips, buccal mucosa, and tongue normal  NECK: supple, thyroid normal size, non-tender, without nodularity LYMPH:  no palpable lymphadenopathy in the cervical, axillary or inguinal LUNGS: clear to auscultation and percussion with normal breathing effort HEART: regular rate & rhythm and no murmurs and no lower extremity edema ABDOMEN:abdomen soft, non-tender and normal bowel sounds Musculoskeletal:no cyanosis of  digits and no clubbing  NEURO: alert & oriented x 3 with fluent speech, no focal motor/sensory deficits  LABORATORY DATA:  I have reviewed the data as listed    Component Value Date/Time   NA 140 05/21/2017 1303   K 4.7 05/21/2017 1303   CL 102 02/18/2017 1150   CO2 29 05/21/2017 1303   GLUCOSE 89 05/21/2017 1303   BUN 27.1 (H) 05/21/2017 1303   CREATININE 0.8 05/21/2017 1303   CALCIUM 9.9 05/21/2017 1303   PROT 7.6 05/21/2017 1303   ALBUMIN 3.4 (L) 05/21/2017 1303   AST 19 05/21/2017 1303   ALT 11 05/21/2017 1303   ALKPHOS 40 05/21/2017 1303   BILITOT 0.92 05/21/2017 1303   GFRNONAA >60 02/18/2017 1150   GFRAA >60 02/18/2017 1150    No results found for: SPEP, UPEP  Lab Results  Component Value Date   WBC 3.9 08/16/2017   NEUTROABS 2.6 08/16/2017   HGB 11.7 08/16/2017   HCT 34.4 (L) 08/16/2017   MCV 100.0 08/16/2017   PLT 148 08/16/2017      Chemistry      Component Value Date/Time   NA 140 05/21/2017 1303   K 4.7 05/21/2017 1303   CL 102 02/18/2017 1150   CO2 29 05/21/2017 1303   BUN 27.1 (H) 05/21/2017 1303   CREATININE 0.8 05/21/2017 1303      Component Value Date/Time   CALCIUM 9.9 05/21/2017 1303   ALKPHOS 40 05/21/2017 1303   AST 19 05/21/2017 1303  ALT 11 05/21/2017 1303   BILITOT 0.92 05/21/2017 1303      ASSESSMENT & PLAN:  History of B-cell lymphoma Clinically, she has no signs of cancer recurrence I do not recommend routine surveillance imaging I will see her back in 6 months with repeat history, physical examination and blood work   No orders of the defined types were placed in this encounter.  All questions were answered. The patient knows to call the clinic with any problems, questions or concerns. No barriers to learning was detected. I spent 10 minutes counseling the patient face to face. The total time spent in the appointment was 15 minutes and more than 50% was on counseling and review of test results     Heath Lark,  MD 08/16/2017 1:39 PM

## 2017-08-16 NOTE — Assessment & Plan Note (Signed)
Clinically, she has no signs of cancer recurrence I do not recommend routine surveillance imaging I will see her back in 6 months with repeat history, physical examination and blood work

## 2017-10-04 DIAGNOSIS — H04123 Dry eye syndrome of bilateral lacrimal glands: Secondary | ICD-10-CM | POA: Diagnosis not present

## 2017-10-04 DIAGNOSIS — Z961 Presence of intraocular lens: Secondary | ICD-10-CM | POA: Diagnosis not present

## 2017-10-04 DIAGNOSIS — H26491 Other secondary cataract, right eye: Secondary | ICD-10-CM | POA: Diagnosis not present

## 2017-10-04 DIAGNOSIS — H532 Diplopia: Secondary | ICD-10-CM | POA: Diagnosis not present

## 2017-10-25 ENCOUNTER — Ambulatory Visit
Admission: RE | Admit: 2017-10-25 | Discharge: 2017-10-25 | Disposition: A | Discharge status: Home / Self Care | Payer: Medicare Other | Source: Ambulatory Visit | Attending: Physician Assistant | Admitting: Physician Assistant

## 2017-10-25 ENCOUNTER — Ambulatory Visit
Admission: RE | Admit: 2017-10-25 | Discharge: 2017-10-25 | Disposition: A | Discharge status: Home / Self Care | Payer: Medicare Other | Source: Ambulatory Visit | Attending: Family Medicine | Admitting: Family Medicine

## 2017-10-25 DIAGNOSIS — M810 Age-related osteoporosis without current pathological fracture: Secondary | ICD-10-CM | POA: Diagnosis not present

## 2017-10-25 DIAGNOSIS — Z1231 Encounter for screening mammogram for malignant neoplasm of breast: Secondary | ICD-10-CM

## 2017-10-25 DIAGNOSIS — Z780 Asymptomatic menopausal state: Secondary | ICD-10-CM | POA: Diagnosis not present

## 2017-10-25 DIAGNOSIS — E2839 Other primary ovarian failure: Secondary | ICD-10-CM

## 2017-12-02 DIAGNOSIS — H532 Diplopia: Secondary | ICD-10-CM | POA: Diagnosis not present

## 2017-12-05 ENCOUNTER — Other Ambulatory Visit: Payer: Self-pay | Admitting: Ophthalmology

## 2017-12-05 DIAGNOSIS — H532 Diplopia: Secondary | ICD-10-CM

## 2017-12-11 DIAGNOSIS — H532 Diplopia: Secondary | ICD-10-CM | POA: Diagnosis not present

## 2017-12-12 ENCOUNTER — Ambulatory Visit
Admission: RE | Admit: 2017-12-12 | Discharge: 2017-12-12 | Disposition: A | Discharge status: Home / Self Care | Payer: Medicare Other | Source: Ambulatory Visit | Attending: Ophthalmology | Admitting: Ophthalmology

## 2017-12-12 DIAGNOSIS — H532 Diplopia: Secondary | ICD-10-CM | POA: Diagnosis not present

## 2017-12-12 MED ORDER — GADOBENATE DIMEGLUMINE 529 MG/ML IV SOLN
10.0000 mL | Freq: Once | INTRAVENOUS | Status: AC | PRN
  Administered 2017-12-12: 10 mL via INTRAVENOUS

## 2017-12-28 ENCOUNTER — Emergency Department (HOSPITAL_COMMUNITY)
Admission: EM | Admit: 2017-12-28 | Discharge: 2017-12-29 | Disposition: A | Discharge status: Home / Self Care | Payer: Medicare Other | Attending: Emergency Medicine | Admitting: Emergency Medicine

## 2017-12-28 ENCOUNTER — Emergency Department (HOSPITAL_COMMUNITY): Payer: Medicare Other

## 2017-12-28 ENCOUNTER — Encounter (HOSPITAL_COMMUNITY): Payer: Self-pay | Admitting: Emergency Medicine

## 2017-12-28 DIAGNOSIS — S6992XA Unspecified injury of left wrist, hand and finger(s), initial encounter: Secondary | ICD-10-CM | POA: Diagnosis present

## 2017-12-28 DIAGNOSIS — Z8572 Personal history of non-Hodgkin lymphomas: Secondary | ICD-10-CM | POA: Insufficient documentation

## 2017-12-28 DIAGNOSIS — Y939 Activity, unspecified: Secondary | ICD-10-CM | POA: Diagnosis not present

## 2017-12-28 DIAGNOSIS — Y999 Unspecified external cause status: Secondary | ICD-10-CM | POA: Insufficient documentation

## 2017-12-28 DIAGNOSIS — S61412A Laceration without foreign body of left hand, initial encounter: Secondary | ICD-10-CM | POA: Diagnosis not present

## 2017-12-28 DIAGNOSIS — S299XXA Unspecified injury of thorax, initial encounter: Secondary | ICD-10-CM | POA: Diagnosis not present

## 2017-12-28 DIAGNOSIS — T148XXA Other injury of unspecified body region, initial encounter: Secondary | ICD-10-CM | POA: Diagnosis not present

## 2017-12-28 DIAGNOSIS — S61512A Laceration without foreign body of left wrist, initial encounter: Secondary | ICD-10-CM | POA: Diagnosis not present

## 2017-12-28 DIAGNOSIS — S3991XA Unspecified injury of abdomen, initial encounter: Secondary | ICD-10-CM | POA: Diagnosis not present

## 2017-12-28 DIAGNOSIS — S2222XA Fracture of body of sternum, initial encounter for closed fracture: Secondary | ICD-10-CM

## 2017-12-28 DIAGNOSIS — Y929 Unspecified place or not applicable: Secondary | ICD-10-CM | POA: Diagnosis not present

## 2017-12-28 DIAGNOSIS — Z7982 Long term (current) use of aspirin: Secondary | ICD-10-CM | POA: Diagnosis not present

## 2017-12-28 DIAGNOSIS — Z87891 Personal history of nicotine dependence: Secondary | ICD-10-CM | POA: Diagnosis not present

## 2017-12-28 DIAGNOSIS — Z23 Encounter for immunization: Secondary | ICD-10-CM | POA: Insufficient documentation

## 2017-12-28 DIAGNOSIS — E039 Hypothyroidism, unspecified: Secondary | ICD-10-CM | POA: Insufficient documentation

## 2017-12-28 DIAGNOSIS — R079 Chest pain, unspecified: Secondary | ICD-10-CM | POA: Diagnosis not present

## 2017-12-28 DIAGNOSIS — Z79899 Other long term (current) drug therapy: Secondary | ICD-10-CM | POA: Insufficient documentation

## 2017-12-28 DIAGNOSIS — R072 Precordial pain: Secondary | ICD-10-CM | POA: Diagnosis not present

## 2017-12-28 LAB — I-STAT CHEM 8, ED
BUN: 19 mg/dL (ref 6–20)
Calcium, Ion: 1.17 mmol/L (ref 1.15–1.40)
Chloride: 101 mmol/L (ref 101–111)
Creatinine, Ser: 0.7 mg/dL (ref 0.44–1.00)
Glucose, Bld: 106 mg/dL — ABNORMAL HIGH (ref 65–99)
HCT: 38 % (ref 36.0–46.0)
Hemoglobin: 12.9 g/dL (ref 12.0–15.0)
Potassium: 4 mmol/L (ref 3.5–5.1)
Sodium: 141 mmol/L (ref 135–145)
TCO2: 28 mmol/L (ref 22–32)

## 2017-12-28 MED ORDER — SULFAMETHOXAZOLE-TRIMETHOPRIM 800-160 MG PO TABS: 1.0000 | ORAL_TABLET | Freq: Two times a day (BID) | ORAL | 0 refills | Status: AC

## 2017-12-28 MED ORDER — IOPAMIDOL (ISOVUE-300) INJECTION 61%
INTRAVENOUS | Status: AC
  Administered 2017-12-28: 100 mL
  Filled 2017-12-28: qty 100

## 2017-12-28 MED ORDER — TETANUS-DIPHTH-ACELL PERTUSSIS 5-2.5-18.5 LF-MCG/0.5 IM SUSP
0.5000 mL | Freq: Once | INTRAMUSCULAR | Status: AC
  Administered 2017-12-28: 0.5 mL via INTRAMUSCULAR
  Filled 2017-12-28: qty 0.5

## 2017-12-28 MED ORDER — LIDOCAINE HCL (PF) 1 % IJ SOLN
10.0000 mL | Freq: Once | INTRAMUSCULAR | Status: AC
  Administered 2017-12-28: 10 mL
  Filled 2017-12-28: qty 10

## 2017-12-28 NOTE — ED Notes (Signed)
Per [facility] PA, only chest xray needed at this time.
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
## 2017-12-28 NOTE — ED Notes (Signed)
Patient transported to CT 
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
## 2017-12-29 DIAGNOSIS — S61412A Laceration without foreign body of left hand, initial encounter: Secondary | ICD-10-CM | POA: Diagnosis not present

## 2017-12-30 DIAGNOSIS — S61412D Laceration without foreign body of left hand, subsequent encounter: Secondary | ICD-10-CM | POA: Diagnosis not present

## 2017-12-30 DIAGNOSIS — S2222XD Fracture of body of sternum, subsequent encounter for fracture with routine healing: Secondary | ICD-10-CM | POA: Diagnosis not present

## 2017-12-31 DIAGNOSIS — S61412A Laceration without foreign body of left hand, initial encounter: Secondary | ICD-10-CM | POA: Diagnosis not present

## 2018-01-07 DIAGNOSIS — Z4789 Encounter for other orthopedic aftercare: Secondary | ICD-10-CM | POA: Diagnosis not present

## 2018-01-20 DIAGNOSIS — S2222XA Fracture of body of sternum, initial encounter for closed fracture: Secondary | ICD-10-CM | POA: Diagnosis not present

## 2018-02-10 ENCOUNTER — Other Ambulatory Visit: Payer: Self-pay | Admitting: Hematology and Oncology

## 2018-02-10 DIAGNOSIS — C8588 Other specified types of non-Hodgkin lymphoma, lymph nodes of multiple sites: Secondary | ICD-10-CM

## 2018-02-10 DIAGNOSIS — D61818 Other pancytopenia: Secondary | ICD-10-CM

## 2018-02-13 ENCOUNTER — Inpatient Hospital Stay: Payer: Medicare Other

## 2018-02-13 ENCOUNTER — Telehealth: Payer: Self-pay | Admitting: Hematology and Oncology

## 2018-02-13 ENCOUNTER — Inpatient Hospital Stay: Payer: Medicare Other | Attending: Hematology and Oncology | Admitting: Hematology and Oncology

## 2018-02-13 DIAGNOSIS — Z8572 Personal history of non-Hodgkin lymphomas: Secondary | ICD-10-CM | POA: Diagnosis not present

## 2018-02-13 DIAGNOSIS — C8588 Other specified types of non-Hodgkin lymphoma, lymph nodes of multiple sites: Secondary | ICD-10-CM

## 2018-02-13 DIAGNOSIS — D61818 Other pancytopenia: Secondary | ICD-10-CM

## 2018-02-13 LAB — COMPREHENSIVE METABOLIC PANEL
ALT: 12 U/L (ref 0–55)
AST: 17 U/L (ref 5–34)
Albumin: 3.8 g/dL (ref 3.5–5.0)
Alkaline Phosphatase: 49 U/L (ref 40–150)
Anion gap: 5 (ref 3–11)
BUN: 20 mg/dL (ref 7–26)
CO2: 30 mmol/L — ABNORMAL HIGH (ref 22–29)
Calcium: 9.5 mg/dL (ref 8.4–10.4)
Chloride: 105 mmol/L (ref 98–109)
Creatinine, Ser: 0.79 mg/dL (ref 0.60–1.10)
GFR calc Af Amer: >60 mL/min (ref >60)
GFR calc non Af Amer: >60 mL/min (ref >60)
Glucose, Bld: 96 mg/dL (ref 70–140)
Potassium: 4.5 mmol/L (ref 3.5–5.1)
Sodium: 140 mmol/L (ref 136–145)
Total Bilirubin: 0.8 mg/dL (ref 0.2–1.2)
Total Protein: 7.5 g/dL (ref 6.4–8.3)

## 2018-02-13 LAB — CBC WITH DIFFERENTIAL/PLATELET
Basophils Absolute: 0 10*3/uL (ref 0.0–0.1)
Basophils Relative: 1 %
Eosinophils Absolute: 0.1 10*3/uL (ref 0.0–0.5)
Eosinophils Relative: 1 %
HCT: 35.2 % (ref 34.8–46.6)
Hemoglobin: 12 g/dL (ref 11.6–15.9)
Lymphocytes Relative: 28 %
Lymphs Abs: 1.6 10*3/uL (ref 0.9–3.3)
MCH: 34.8 pg — ABNORMAL HIGH (ref 25.1–34.0)
MCHC: 34.1 g/dL (ref 31.5–36.0)
MCV: 102.1 fL — ABNORMAL HIGH (ref 79.5–101.0)
Monocytes Absolute: 0.6 10*3/uL (ref 0.1–0.9)
Monocytes Relative: 10 %
Neutro Abs: 3.4 10*3/uL (ref 1.5–6.5)
Neutrophils Relative %: 60 %
Platelets: 168 10*3/uL (ref 145–400)
RBC: 3.45 MIL/uL — ABNORMAL LOW (ref 3.70–5.45)
RDW: 13.5 % (ref 11.2–14.5)
WBC: 5.7 10*3/uL (ref 3.9–10.3)

## 2018-02-13 LAB — LACTATE DEHYDROGENASE: LDH: 210 U/L (ref 125–245)

## 2018-02-13 NOTE — Telephone Encounter (Signed)
Gave patient AVs and calendar of upcoming may 2020 appointments °

## 2018-02-14 ENCOUNTER — Encounter: Payer: Self-pay | Admitting: Hematology and Oncology

## 2018-02-14 NOTE — Progress Notes (Signed)
[place] OFFICE PROGRESS NOTE  Patient Care Team: Joy Lynch as PCP - General (Physician Assistant)  ASSESSMENT & PLAN:  Lymphoma, marginal zone, lymph nodes of multiple sites [facility]) She has complete response to treatment even though she only barely finished 1 cycle of therapy I recommend close observation with repeat history, physical examination and blood count only Clinically, she has no signs or symptoms of cancer recurrence I educated her signs and symptoms to watch out for I recommend close follow-up with her primary care doctor at the end of the year. I reinforced the importance of annual influenza vaccination I plan to see her back again next year with history, examination and blood work I do not recommend routine surveillance imaging study if she remained asymptomatic   No orders of the defined types were placed in this encounter.   INTERVAL HISTORY: Please see below for problem oriented charting. She returns for further follow-up She denies new lymphadenopathy Denies anorexia, abnormal weight loss or abnormal night sweats She denies recent infection  SUMMARY OF ONCOLOGIC HISTORY:   Lymphoma, marginal zone, lymph nodes of multiple sites ([place])   10/26/2011 Initial Diagnosis    The patient was diagnosed with marginal zone lymphoma/lymphoplasmacytic lymphoma in [place].  She received single agent rituximab,  weekly 4, completed by May 2013.  She was subsequently found to have disease relapse, and was treated with single agent bendamustine from January 2014 to June 2014, 70 mg/m in [place]      05/12/2013 Imaging    Outside CT showed no evidence of disease      11/18/2014 Imaging    Outside CT scan show no evidence of disease      11/03/2015 Pathology Results    Peripheral Blood Flow Cytometry - MONOCLONAL B CELL POPULATION IDENTIFIED. - SEE COMMENT. Diagnosis Comment: The majority of lymphocytes consist of a monoclonal population of  B cells expressing pan B cell antigens including CD20 with associated expression of CD5, CD25, CD11c, and FMC7. No CD10 or CD103 expression is identified. The peripheral blood shows a population of atypical lymphocytes ranging from small to large cells with scanty to moderately abundant cytoplasm, dense to partially clumped chromatin and small to inconspicuous nucleoli. The findings are consistent with a B-cell lymphoproliferative process. Consideration should be given to chronic lymphocytic leukemia (variant or transformation), mantle cell lymphoma and other B cell lymphomas with rare CD5 expression. FISH studies may be helpful in this regard      01/04/2017 PET scan    1. Mildly enlarged retroperitoneal lymph nodes are present including a 1.4 cm node anterior to the aortic bifurcation which demonstrates low-grade metabolic activity with maximum SUV 2.5. This would be considered Deauville 3 activity. 2. Splenomegaly with splenic volume of 540 cc, but no focal splenic lesions seen. 3. Other imaging findings of potential clinical significance: Considerable atherosclerosis. Bilateral nonobstructive nephrolithiasis. Extensive sigmoid colon diverticulosis. Pelvic floor laxity. Right shoulder prosthesis with activity along the joint margins likely inflammatory. Old pelvic fractures with sclerotic late phase healing response.      02/06/2017 - 02/07/2017 Chemotherapy    She only received 1 cycle of treatment. Treatment was stopped due to very poor tolerance      05/21/2017 PET scan    1. Response to therapy of abdominal retroperitoneal nodes. Resolution of splenomegaly. 2. No new or progressive disease identified. 3. Hypermetabolism about the lateral right chest wall is favored to be artifactual, adjacent to right shoulder arthroplasty. Consider physical exam correlation. Similarly,  right paraspinous mild hypermetabolism within the chest is favored to be due to motion after pharmaceutical injection or possibly  prior muscular strain. 4. Incidental findings, including cholelithiasis and right nephrolithiasis.       REVIEW OF SYSTEMS:   Constitutional: Denies fevers, chills or abnormal weight loss Eyes: Denies blurriness of vision Ears, nose, mouth, throat, and face: Denies mucositis or sore throat Respiratory: Denies cough, dyspnea or wheezes Cardiovascular: Denies palpitation, chest discomfort or lower extremity swelling Gastrointestinal:  Denies nausea, heartburn or change in bowel habits Skin: Denies abnormal skin rashes Lymphatics: Denies new lymphadenopathy or easy bruising Neurological:Denies numbness, tingling or new weaknesses Behavioral/Psych: Mood is stable, no new changes  All other systems were reviewed with the patient and are negative.  I have reviewed the past medical history, past surgical history, social history and family history with the patient and they are unchanged from previous note.  ALLERGIES:  is allergic to penicillins; tegretol [carbamazepine]; and lyrica [pregabalin].  MEDICATIONS:  Current Outpatient Medications  Medication Sig Dispense Refill  . alendronate (FOSAMAX) 70 MG tablet Take 70 mg by mouth once a week.  2  . aspirin 81 MG tablet Take 81 mg by mouth at bedtime.     . Biotin 10 MG CAPS Take 10 mg by mouth daily.    . calcium-vitamin D (OSCAL WITH D) 500-200 MG-UNIT tablet Take 1 tablet by mouth daily.     Marland Kitchen gabapentin (NEURONTIN) 400 MG capsule Take 400 mg 3 (three) times daily by mouth.  0  . ipratropium (ATROVENT) 0.06 % nasal spray Place 2 sprays into the nose as needed.  5  . levothyroxine (SYNTHROID, LEVOTHROID) 88 MCG tablet Take 88 mcg by mouth daily. Six days per week  1  . Meclizine HCl 25 MG CHEW Chew 25 mg by mouth as needed.    . Multiple Vitamin (MULTI-VITAMINS) TABS Take 1 tablet by mouth daily.    . propranolol (INDERAL) 10 MG tablet Take 10 mg by mouth daily.     No current facility-administered medications for this visit.      PHYSICAL EXAMINATION: ECOG PERFORMANCE STATUS: 0 - Asymptomatic  Vitals:   02/13/18 1338  BP: (!) 135/51  Pulse: 74  Resp: 18  Temp: 97.8 F (36.6 C)  SpO2: 96%   Filed Weights   02/13/18 1338  Weight: 124 lb 8 oz (56.5 kg)    GENERAL:alert, no distress and comfortable SKIN: skin color, texture, turgor are normal, no rashes or significant lesions EYES: normal, Conjunctiva are pink and non-injected, sclera clear OROPHARYNX:no exudate, no erythema and lips, buccal mucosa, and tongue normal  NECK: supple, thyroid normal size, non-tender, without nodularity LYMPH:  no palpable lymphadenopathy in the cervical, axillary or inguinal LUNGS: clear to auscultation and percussion with normal breathing effort HEART: regular rate & rhythm and no murmurs and no lower extremity edema ABDOMEN:abdomen soft, non-tender and normal bowel sounds Musculoskeletal:no cyanosis of digits and no clubbing  NEURO: alert & oriented x 3 with fluent speech, no focal motor/sensory deficits  LABORATORY DATA:  I have reviewed the data as listed    Component Value Date/Time   NA 140 02/13/2018 1253   NA 138 08/16/2017 1256   K 4.5 02/13/2018 1253   K 4.3 08/16/2017 1256   CL 105 02/13/2018 1253   CO2 30 (H) 02/13/2018 1253   CO2 25 08/16/2017 1256   GLUCOSE 96 02/13/2018 1253   GLUCOSE 88 08/16/2017 1256   BUN 20 02/13/2018 1253   BUN 19.6  08/16/2017 1256   CREATININE 0.79 02/13/2018 1253   CREATININE 0.7 08/16/2017 1256   CALCIUM 9.5 02/13/2018 1253   CALCIUM 9.2 08/16/2017 1256   PROT 7.5 02/13/2018 1253   PROT 7.5 08/16/2017 1256   ALBUMIN 3.8 02/13/2018 1253   ALBUMIN 3.6 08/16/2017 1256   AST 17 02/13/2018 1253   AST 18 08/16/2017 1256   ALT 12 02/13/2018 1253   ALT 11 08/16/2017 1256   ALKPHOS 49 02/13/2018 1253   ALKPHOS 42 08/16/2017 1256   BILITOT 0.8 02/13/2018 1253   BILITOT 0.87 08/16/2017 1256   GFRNONAA >60 02/13/2018 1253   GFRAA >60 02/13/2018 1253    No results  found for: SPEP, UPEP  Lab Results  Component Value Date   WBC 5.7 02/13/2018   NEUTROABS 3.4 02/13/2018   HGB 12.0 02/13/2018   HCT 35.2 02/13/2018   MCV 102.1 (H) 02/13/2018   PLT 168 02/13/2018      Chemistry      Component Value Date/Time   NA 140 02/13/2018 1253   NA 138 08/16/2017 1256   K 4.5 02/13/2018 1253   K 4.3 08/16/2017 1256   CL 105 02/13/2018 1253   CO2 30 (H) 02/13/2018 1253   CO2 25 08/16/2017 1256   BUN 20 02/13/2018 1253   BUN 19.6 08/16/2017 1256   CREATININE 0.79 02/13/2018 1253   CREATININE 0.7 08/16/2017 1256      Component Value Date/Time   CALCIUM 9.5 02/13/2018 1253   CALCIUM 9.2 08/16/2017 1256   ALKPHOS 49 02/13/2018 1253   ALKPHOS 42 08/16/2017 1256   AST 17 02/13/2018 1253   AST 18 08/16/2017 1256   ALT 12 02/13/2018 1253   ALT 11 08/16/2017 1256   BILITOT 0.8 02/13/2018 1253   BILITOT 0.87 08/16/2017 1256       All questions were answered. The patient knows to call the clinic with any problems, questions or concerns. No barriers to learning was detected.  I spent 10 minutes counseling the patient face to face. The total time spent in the appointment was 15 minutes and more than 50% was on counseling and review of test results  Heath Lark, MD 02/14/2018 8:33 AM

## 2018-02-14 NOTE — Assessment & Plan Note (Addendum)
She has complete response to treatment even though she only barely finished 1 cycle of therapy I recommend close observation with repeat history, physical examination and blood count only Clinically, she has no signs or symptoms of cancer recurrence I educated her signs and symptoms to watch out for I recommend close follow-up with her primary care doctor at the end of the year. I reinforced the importance of annual influenza vaccination I plan to see her back again next year with history, examination and blood work I do not recommend routine surveillance imaging study if she remained asymptomatic

## 2018-04-02 DIAGNOSIS — M545 Low back pain: Secondary | ICD-10-CM | POA: Diagnosis not present

## 2018-04-02 DIAGNOSIS — M2170 Unequal limb length (acquired), unspecified site: Secondary | ICD-10-CM | POA: Diagnosis not present

## 2018-04-02 DIAGNOSIS — R0602 Shortness of breath: Secondary | ICD-10-CM | POA: Diagnosis not present

## 2018-04-02 IMAGING — CT CT ABD-PELV W/ CM
2 of 5 series · 13 of 36 positions shown, 16 images · IV contrast (Omni 300)
Comparison: CT dated 02/18/2017 and PET CT dated 05/21/2017

CLINICAL DATA: [AGE] female with trauma. History of lymphoma.

EXAM:
CT CHEST, ABDOMEN, AND PELVIS WITH CONTRAST
TECHNIQUE: Multidetector CT imaging of the chest, abdomen and pelvis was
performed following the standard protocol during bolus
administration of intravenous contrast.
CONTRAST:  100mL M3C72S-EII IOPAMIDOL (M3C72S-EII) INJECTION 61%

[Series 7: cap with 5mm st · axial · 0.75mm/px · z∈[+867,+1367]mm · 10 of 124 slices shown, 13 images]
[im 12/124  mediastinal]
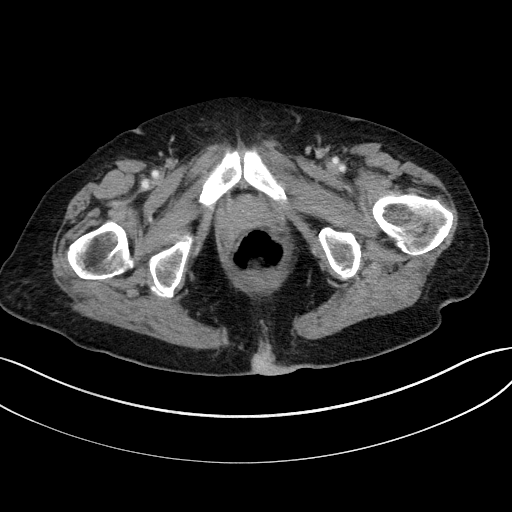
[im 12/124  lung]
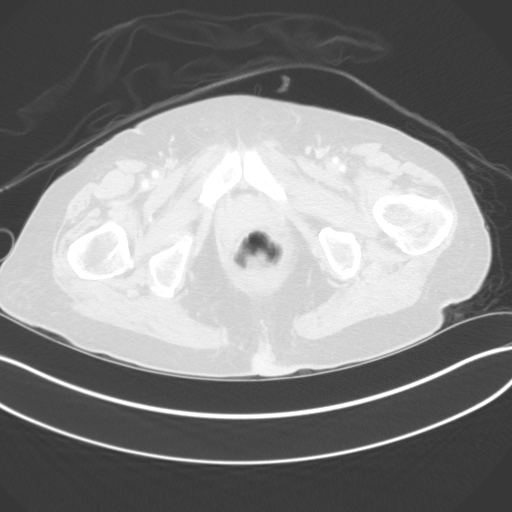
[im 23/124  lung]
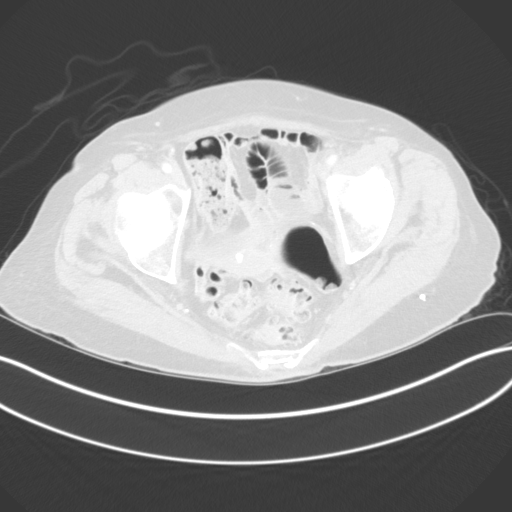
[im 34/124  lung]
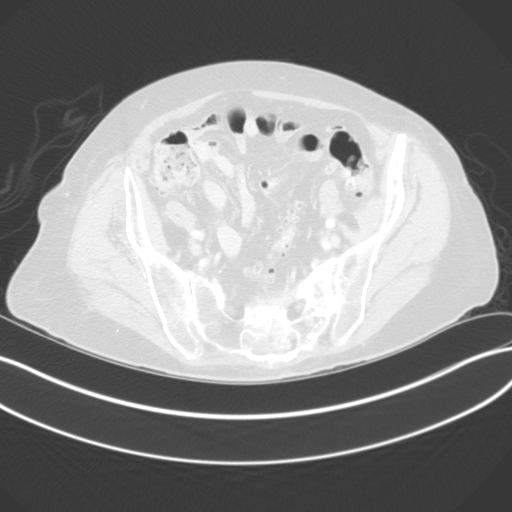
[im 45/124  lung]
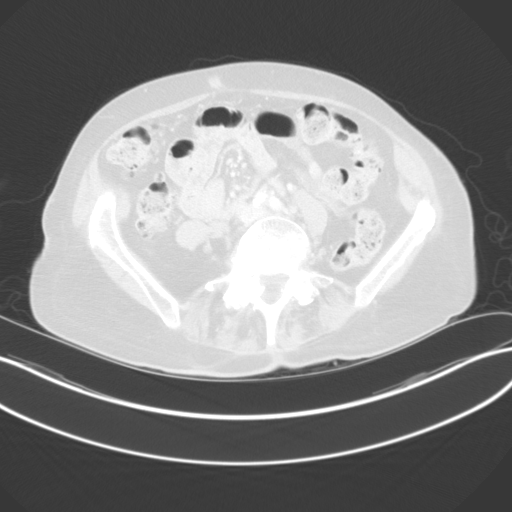
[im 56/124  mediastinal]
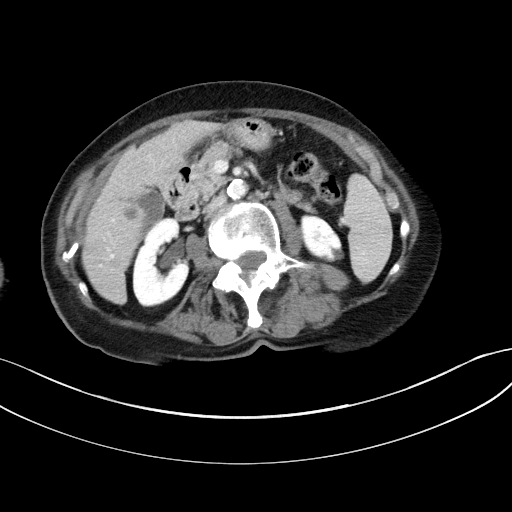
[im 56/124  lung]
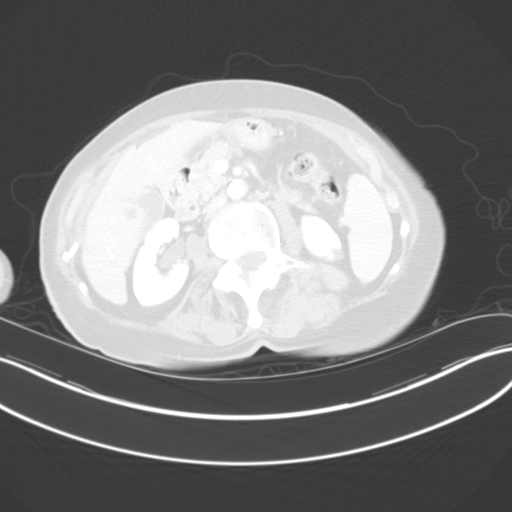
[im 68/124  lung]
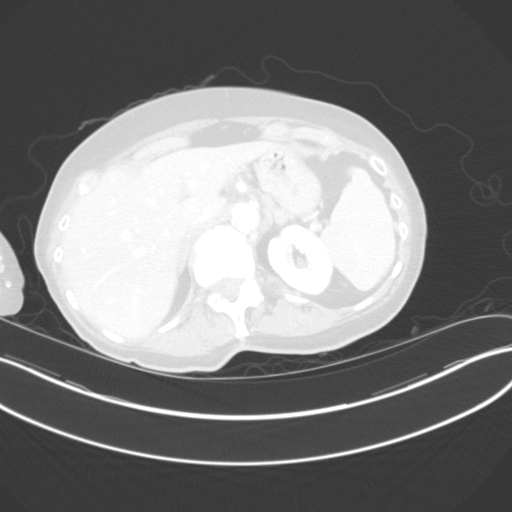
[im 79/124  lung]
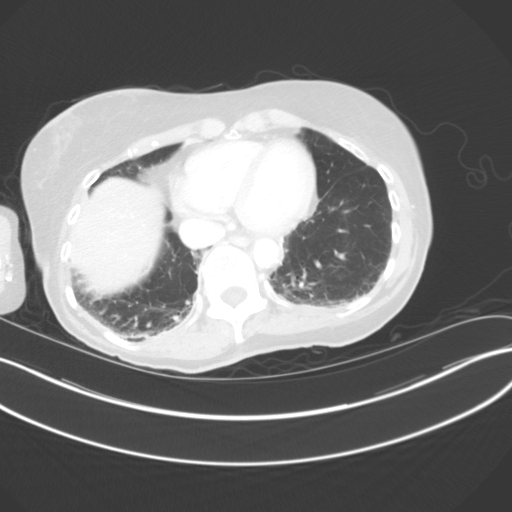
[im 90/124  lung]
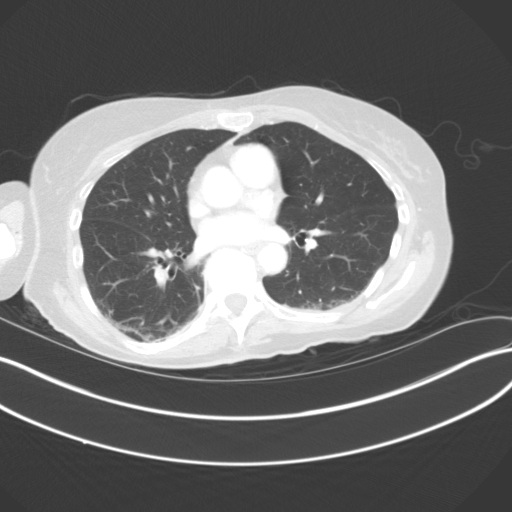
[im 101/124  mediastinal]
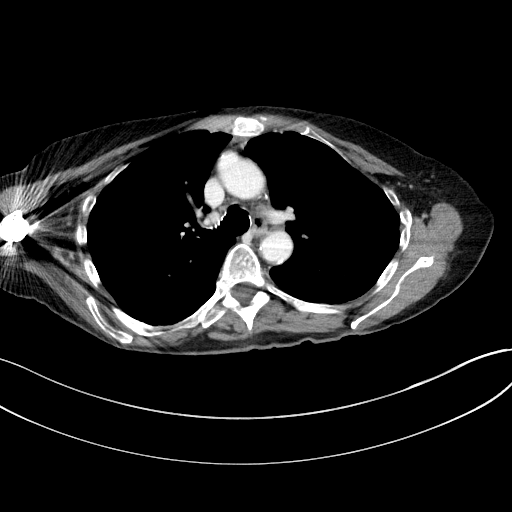
[im 101/124  lung]
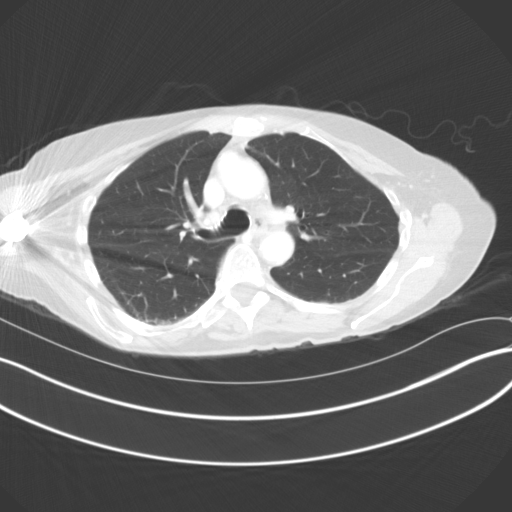
[im 112/124  lung]
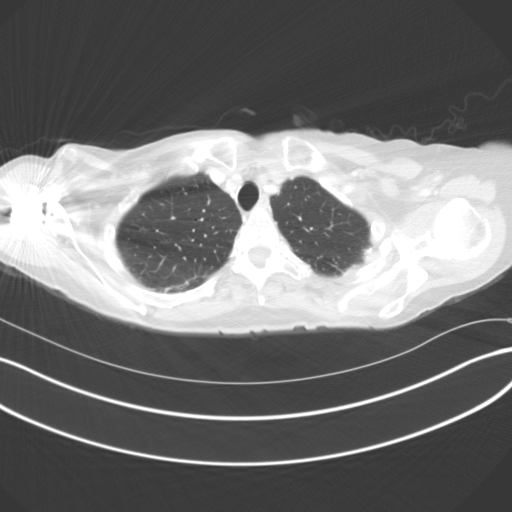

[Series 9: cap with 3mm st cor · coronal · 0.82mm/px · 3 of 127 slices shown]
[im 26/127  lung]
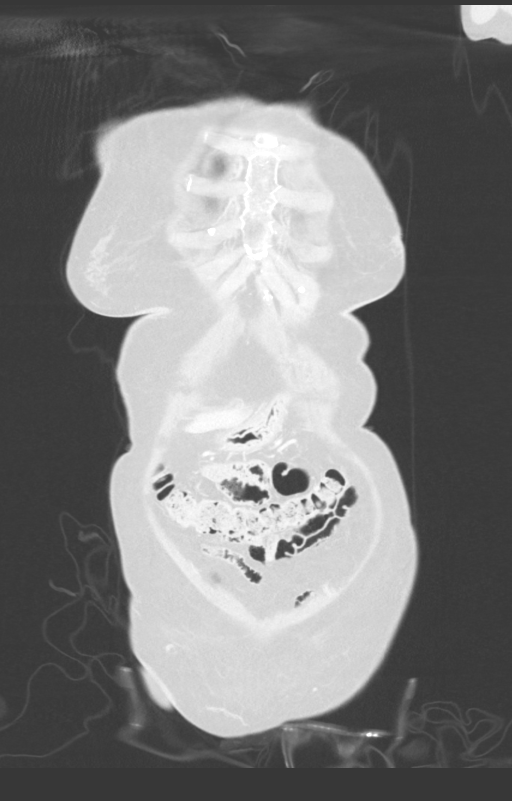
[im 51/127  lung]
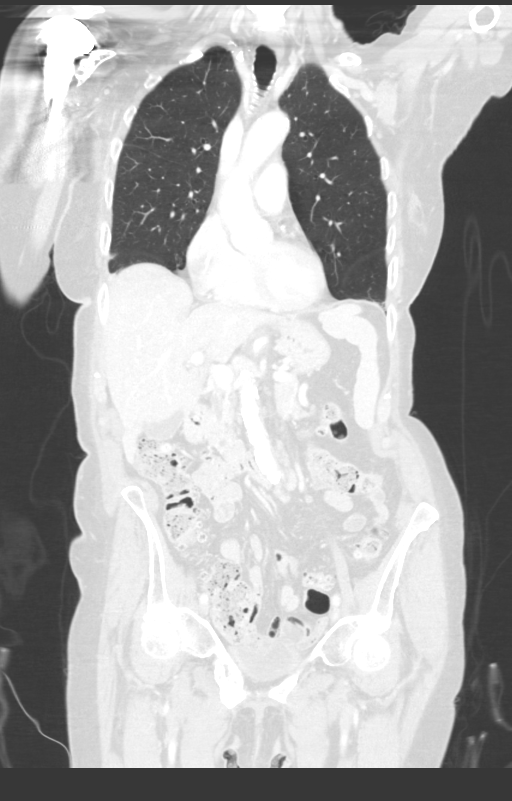
[im 76/127  lung]
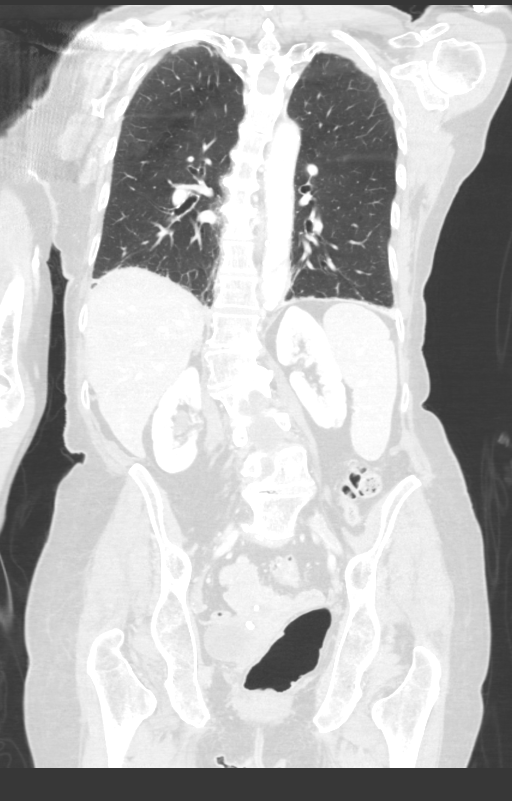

[13 of 36 positions shown; findings below may reference images not displayed]

FINDINGS: CT CHEST FINDINGS

Cardiovascular: There is no cardiomegaly or pericardial effusion.
There is coronary vascular calcification. Moderate atherosclerotic
calcification of the thoracic aorta. No aneurysmal dilatation or
evidence of dissection. The central pulmonary arteries appear
unremarkable.

Mediastinum/Nodes: There is no hilar or mediastinal adenopathy.
Esophagus is grossly unremarkable. No mediastinal fluid collection.

Lungs/Pleura: There are linear bibasilar atelectasis/scarring. There
are biapical subpleural thickening/scarring. No new consolidative
changes. There is no pleural effusion or pneumothorax. The central
airways are patent.

Musculoskeletal: Right shoulder hemiarthroplasty with associated
streak artifact limiting evaluation. There is a fracture of the
midportion of the body of the sternum with mild buckling of the
posterior cortex. The bones are osteopenic. There is degenerative
changes and scoliosis of the thoracic spine. No definite other acute
fractures noted. Focal area of discontinuity of the right second rib
(series 7, image 12) likely artifactual. No displaced rib fractures.

CT ABDOMEN PELVIS FINDINGS

No intra-abdominal free air or free fluid.

Hepatobiliary: Small stable cyst in the right lobe of the liver. The
liver is otherwise unremarkable. No intrahepatic biliary ductal
dilatation. Small stone in the gallbladder. No pericholecystic
fluid.

Pancreas: Unremarkable. No pancreatic ductal dilatation or
surrounding inflammatory changes.

Spleen: Subcentimeter hypodense lesion in the superior pole of the
spleen similar to the prior CT.

Adrenals/Urinary Tract: The adrenal glands are unremarkable. There
is a 9 mm nonobstructing stone in the inferior pole of the right
kidney. There is no hydronephrosis on either side. There is
symmetric enhancement and excretion of contrast by both kidneys.
Small bilateral renal cysts. The visualized ureters and urinary
bladder appear unremarkable.

Stomach/Bowel: There is sigmoid diverticulosis and scattered colonic
diverticula without active inflammatory changes. There is no bowel
obstruction or active inflammation. Normal appendix.

Vascular/Lymphatic: Advanced aortoiliac atherosclerotic disease. No
portal venous gas. There is no adenopathy.

Reproductive: Calcified uterine fibroid. Similar appearance of the
ovaries. A 1.8 x 2.7 cm low attenuating structure in the region of
the right ovary with fluid attenuation may represent an ovarian
cyst. This is similar to prior CT.

Other: None

Musculoskeletal: Osteopenia with scoliosis and degenerative changes
of the spine. Right S2 Tarlov cyst. There is multilevel disc
desiccation and vacuum phenomena. Grade 1 L4-L5 anterolisthesis. No
acute osseous pathology. Old healed right pubic bone fracture.
Irregularity of the left sacral bone and left SI joint, likely
related to an old healed fracture.
IMPRESSION: Fracture of the body of the sternum. No other acute/traumatic
intrathoracic, abdominal, or pelvic pathology.

## 2018-04-27 DIAGNOSIS — K219 Gastro-esophageal reflux disease without esophagitis: Secondary | ICD-10-CM | POA: Diagnosis not present

## 2018-04-27 DIAGNOSIS — R197 Diarrhea, unspecified: Secondary | ICD-10-CM | POA: Diagnosis not present

## 2018-04-29 DIAGNOSIS — M545 Low back pain: Secondary | ICD-10-CM | POA: Diagnosis not present

## 2018-04-29 DIAGNOSIS — M25551 Pain in right hip: Secondary | ICD-10-CM | POA: Diagnosis not present

## 2018-05-29 DIAGNOSIS — B309 Viral conjunctivitis, unspecified: Secondary | ICD-10-CM | POA: Diagnosis not present

## 2018-06-05 DIAGNOSIS — B309 Viral conjunctivitis, unspecified: Secondary | ICD-10-CM | POA: Diagnosis not present

## 2018-06-16 DIAGNOSIS — G500 Trigeminal neuralgia: Secondary | ICD-10-CM | POA: Diagnosis not present

## 2018-06-26 DIAGNOSIS — G500 Trigeminal neuralgia: Secondary | ICD-10-CM | POA: Diagnosis not present

## 2018-06-28 DIAGNOSIS — Z23 Encounter for immunization: Secondary | ICD-10-CM | POA: Diagnosis not present

## 2018-07-03 DIAGNOSIS — B309 Viral conjunctivitis, unspecified: Secondary | ICD-10-CM | POA: Diagnosis not present

## 2018-08-18 DIAGNOSIS — H1013 Acute atopic conjunctivitis, bilateral: Secondary | ICD-10-CM | POA: Diagnosis not present

## 2018-08-19 DIAGNOSIS — M810 Age-related osteoporosis without current pathological fracture: Secondary | ICD-10-CM | POA: Diagnosis not present

## 2018-08-19 DIAGNOSIS — C9110 Chronic lymphocytic leukemia of B-cell type not having achieved remission: Secondary | ICD-10-CM | POA: Diagnosis not present

## 2018-08-19 DIAGNOSIS — Z0000 Encounter for general adult medical examination without abnormal findings: Secondary | ICD-10-CM | POA: Diagnosis not present

## 2018-08-19 DIAGNOSIS — Z1389 Encounter for screening for other disorder: Secondary | ICD-10-CM | POA: Diagnosis not present

## 2018-08-19 DIAGNOSIS — Z131 Encounter for screening for diabetes mellitus: Secondary | ICD-10-CM | POA: Diagnosis not present

## 2018-08-19 DIAGNOSIS — E039 Hypothyroidism, unspecified: Secondary | ICD-10-CM | POA: Diagnosis not present

## 2018-09-05 DIAGNOSIS — H01001 Unspecified blepharitis right upper eyelid: Secondary | ICD-10-CM | POA: Diagnosis not present

## 2018-09-05 DIAGNOSIS — H1013 Acute atopic conjunctivitis, bilateral: Secondary | ICD-10-CM | POA: Diagnosis not present

## 2018-09-17 DIAGNOSIS — B0053 Herpesviral conjunctivitis: Secondary | ICD-10-CM | POA: Diagnosis not present

## 2018-09-17 DIAGNOSIS — Z961 Presence of intraocular lens: Secondary | ICD-10-CM | POA: Diagnosis not present

## 2019-02-10 ENCOUNTER — Telehealth: Payer: Self-pay | Admitting: *Deleted

## 2019-02-10 NOTE — Telephone Encounter (Signed)
Patient called to cancel appointment on Thursday. She wants to come in the end of June. Appointment rescheduled for June 29th.

## 2019-02-12 ENCOUNTER — Other Ambulatory Visit: Payer: Medicare Other

## 2019-02-12 ENCOUNTER — Ambulatory Visit: Payer: Medicare Other | Admitting: Hematology and Oncology

## 2019-03-25 ENCOUNTER — Telehealth: Payer: Self-pay | Admitting: Hematology and Oncology

## 2019-03-25 ENCOUNTER — Telehealth: Payer: Self-pay

## 2019-03-25 NOTE — Telephone Encounter (Signed)
Scheduled appt per 6/24 sch message - pt is aware of appt date and time

## 2019-03-25 NOTE — Telephone Encounter (Signed)
Called and left a message asking her to call the office back. 

## 2019-03-25 NOTE — Telephone Encounter (Signed)
-----   Message from Heath Lark, MD sent at 03/25/2019  9:00 AM EDT ----- Regarding: reschedule If she no symtpoms, I suggest reschedule to Sept due to [place]

## 2019-03-25 NOTE — Telephone Encounter (Signed)
Called back and given below message. She verbalized understanding. She would like to reschedule to September. Scheduling message sent.

## 2019-03-30 ENCOUNTER — Other Ambulatory Visit: Payer: PRIVATE HEALTH INSURANCE

## 2019-03-30 ENCOUNTER — Ambulatory Visit: Payer: PRIVATE HEALTH INSURANCE | Admitting: Hematology and Oncology

## 2019-06-24 ENCOUNTER — Other Ambulatory Visit: Payer: Self-pay | Admitting: Hematology and Oncology

## 2019-06-24 DIAGNOSIS — C8588 Other specified types of non-Hodgkin lymphoma, lymph nodes of multiple sites: Secondary | ICD-10-CM

## 2019-06-25 ENCOUNTER — Other Ambulatory Visit: Payer: Self-pay

## 2019-06-25 ENCOUNTER — Encounter: Payer: Self-pay | Admitting: Hematology and Oncology

## 2019-06-25 ENCOUNTER — Inpatient Hospital Stay: Payer: Medicare Other | Attending: Hematology and Oncology | Admitting: Hematology and Oncology

## 2019-06-25 ENCOUNTER — Inpatient Hospital Stay: Payer: Medicare Other

## 2019-06-25 DIAGNOSIS — Z8572 Personal history of non-Hodgkin lymphomas: Secondary | ICD-10-CM | POA: Diagnosis not present

## 2019-06-25 DIAGNOSIS — D61818 Other pancytopenia: Secondary | ICD-10-CM

## 2019-06-25 DIAGNOSIS — C8588 Other specified types of non-Hodgkin lymphoma, lymph nodes of multiple sites: Secondary | ICD-10-CM | POA: Diagnosis not present

## 2019-06-25 DIAGNOSIS — D72820 Lymphocytosis (symptomatic): Secondary | ICD-10-CM | POA: Insufficient documentation

## 2019-06-25 LAB — CBC WITH DIFFERENTIAL/PLATELET
Abs Immature Granulocytes: 0 10*3/uL (ref 0.00–0.07)
Basophils Absolute: 0.1 10*3/uL (ref 0.0–0.1)
Basophils Relative: 1 %
Eosinophils Absolute: 0 10*3/uL (ref 0.0–0.5)
Eosinophils Relative: 0 %
HCT: 38.8 % (ref 36.0–46.0)
Hemoglobin: 12.6 g/dL (ref 12.0–15.0)
Lymphocytes Relative: 76 %
Lymphs Abs: 10.9 10*3/uL — ABNORMAL HIGH (ref 0.7–4.0)
MCH: 33.2 pg (ref 26.0–34.0)
MCHC: 32.5 g/dL (ref 30.0–36.0)
MCV: 102.4 fL — ABNORMAL HIGH (ref 80.0–100.0)
Monocytes Absolute: 0.7 10*3/uL (ref 0.1–1.0)
Monocytes Relative: 5 %
Neutro Abs: 2.6 10*3/uL (ref 1.7–17.7)
Neutrophils Relative %: 18 %
Platelets: 142 10*3/uL — ABNORMAL LOW (ref 150–400)
RBC: 3.79 MIL/uL — ABNORMAL LOW (ref 3.87–5.11)
RDW: 13.3 % (ref 11.5–15.5)
WBC: 14.4 10*3/uL — ABNORMAL HIGH (ref 4.0–10.5)
nRBC: 0 % (ref 0.0–0.2)

## 2019-06-25 NOTE — Assessment & Plan Note (Signed)
Her examination is benign Her peripheral blood show evidence of lymphocytosis She has early signs of lymphoma relapse Due to lack of symptoms, recommend observation for now I plan to see her again in 6 months for further follow-up and she agreed

## 2019-06-25 NOTE — Progress Notes (Signed)
[place] OFFICE PROGRESS NOTE  Patient Care Team: Hilbert Bible as PCP - General (Physician Assistant)  ASSESSMENT & PLAN:  Lymphoma, marginal zone, lymph nodes of multiple sites [facility]) Her examination is benign Her peripheral blood show evidence of lymphocytosis She has early signs of lymphoma relapse Due to lack of symptoms, recommend observation for now I plan to see her again in 6 months for further follow-up and she agreed  Pancytopenia, acquired [facility]) She has mild thrombocytopenia likely secondary to cancer relapse She is not symptomatic Observe only for now As above, I recommend seeing her back in 6 months for further follow-up   Orders Placed This Encounter  Procedures  . Comprehensive metabolic panel    Standing Status:   Future    Standing Expiration Date:   07/29/2020  . CBC with Differential/Platelet    Standing Status:   Future    Standing Expiration Date:   07/29/2020  . Lactate dehydrogenase    Standing Status:   Future    Standing Expiration Date:   06/24/2020    INTERVAL HISTORY: Please see below for problem oriented charting. She returns for further follow-up She denies recent infection, fever or chills No new lymphadenopathy  SUMMARY OF ONCOLOGIC HISTORY: Oncology History  Lymphoma, marginal zone, lymph nodes of multiple sites ([place])  10/26/2011 Initial Diagnosis   The patient was diagnosed with marginal zone lymphoma/lymphoplasmacytic lymphoma in [place].  She received single agent rituximab,  weekly 4, completed by May 2013.  She was subsequently found to have disease relapse, and was treated with single agent bendamustine from January 2014 to June 2014, 70 mg/m in [place]   05/12/2013 Imaging   Outside CT showed no evidence of disease   11/18/2014 Imaging   Outside CT scan show no evidence of disease   11/03/2015 Pathology Results   Peripheral Blood Flow Cytometry - MONOCLONAL B CELL POPULATION IDENTIFIED. - SEE  COMMENT. Diagnosis Comment: The majority of lymphocytes consist of a monoclonal population of B cells expressing pan B cell antigens including CD20 with associated expression of CD5, CD25, CD11c, and FMC7. No CD10 or CD103 expression is identified. The peripheral blood shows a population of atypical lymphocytes ranging from small to large cells with scanty to moderately abundant cytoplasm, dense to partially clumped chromatin and small to inconspicuous nucleoli. The findings are consistent with a B-cell lymphoproliferative process. Consideration should be given to chronic lymphocytic leukemia (variant or transformation), mantle cell lymphoma and other B cell lymphomas with rare CD5 expression. FISH studies may be helpful in this regard   01/04/2017 PET scan   1. Mildly enlarged retroperitoneal lymph nodes are present including a 1.4 cm node anterior to the aortic bifurcation which demonstrates low-grade metabolic activity with maximum SUV 2.5. This would be considered Deauville 3 activity. 2. Splenomegaly with splenic volume of 540 cc, but no focal splenic lesions seen. 3. Other imaging findings of potential clinical significance: Considerable atherosclerosis. Bilateral nonobstructive nephrolithiasis. Extensive sigmoid colon diverticulosis. Pelvic floor laxity. Right shoulder prosthesis with activity along the joint margins likely inflammatory. Old pelvic fractures with sclerotic late phase healing response.   02/06/2017 - 02/07/2017 Chemotherapy   She only received 1 cycle of treatment. Treatment was stopped due to very poor tolerance   05/21/2017 PET scan   1. Response to therapy of abdominal retroperitoneal nodes. Resolution of splenomegaly. 2. No new or progressive disease identified. 3. Hypermetabolism about the lateral right chest wall is favored to be artifactual, adjacent to right shoulder  arthroplasty. Consider physical exam correlation. Similarly, right paraspinous mild hypermetabolism within the  chest is favored to be due to motion after pharmaceutical injection or possibly prior muscular strain. 4. Incidental findings, including cholelithiasis and right nephrolithiasis.     REVIEW OF SYSTEMS:   Constitutional: Denies fevers, chills or abnormal weight loss Eyes: Denies blurriness of vision Ears, nose, mouth, throat, and face: Denies mucositis or sore throat Respiratory: Denies cough, dyspnea or wheezes Cardiovascular: Denies palpitation, chest discomfort or lower extremity swelling Gastrointestinal:  Denies nausea, heartburn or change in bowel habits Skin: Denies abnormal skin rashes Lymphatics: Denies new lymphadenopathy or easy bruising Neurological:Denies numbness, tingling or new weaknesses Behavioral/Psych: Mood is stable, no new changes  All other systems were reviewed with the patient and are negative.  I have reviewed the past medical history, past surgical history, social history and family history with the patient and they are unchanged from previous note.  ALLERGIES:  is allergic to penicillins; tegretol [carbamazepine]; and lyrica [pregabalin].  MEDICATIONS:  Current Outpatient Medications  Medication Sig Dispense Refill  . alendronate (FOSAMAX) 70 MG tablet Take 70 mg by mouth once a week.  2  . aspirin 81 MG tablet Take 81 mg by mouth at bedtime.     . Biotin 10 MG CAPS Take 10 mg by mouth daily.    . calcium-vitamin D (OSCAL WITH D) 500-200 MG-UNIT tablet Take 1 tablet by mouth daily.     Marland Kitchen gabapentin (NEURONTIN) 400 MG capsule Take 400 mg 3 (three) times daily by mouth.  0  . ipratropium (ATROVENT) 0.06 % nasal spray Place 2 sprays into the nose as needed.  5  . levothyroxine (SYNTHROID, LEVOTHROID) 88 MCG tablet Take 88 mcg by mouth daily. Six days per week  1  . Meclizine HCl 25 MG CHEW Chew 25 mg by mouth as needed.    . Multiple Vitamin (MULTI-VITAMINS) TABS Take 1 tablet by mouth daily.    . propranolol (INDERAL) 10 MG tablet Take 10 mg by mouth daily.      No current facility-administered medications for this visit.     PHYSICAL EXAMINATION: ECOG PERFORMANCE STATUS: 1 - Symptomatic but completely ambulatory  Vitals:   06/25/19 1343  BP: 114/75  Pulse: 79  Resp: 18  Temp: 98.2 F (36.8 C)  SpO2: 100%   Filed Weights   06/25/19 1343  Weight: 126 lb 9.6 oz (57.4 kg)    GENERAL:alert, no distress and comfortable SKIN: skin color, texture, turgor are normal, no rashes or significant lesions EYES: normal, Conjunctiva are pink and non-injected, sclera clear OROPHARYNX:no exudate, no erythema and lips, buccal mucosa, and tongue normal  NECK: supple, thyroid normal size, non-tender, without nodularity LYMPH:  no palpable lymphadenopathy in the cervical, axillary or inguinal LUNGS: clear to auscultation and percussion with normal breathing effort HEART: regular rate & rhythm and no murmurs and no lower extremity edema ABDOMEN:abdomen soft, non-tender and normal bowel sounds Musculoskeletal:no cyanosis of digits and no clubbing  NEURO: alert & oriented x 3 with fluent speech, no focal motor/sensory deficits  LABORATORY DATA:  I have reviewed the data as listed    Component Value Date/Time   NA 140 02/13/2018 1253   NA 138 08/16/2017 1256   K 4.5 02/13/2018 1253   K 4.3 08/16/2017 1256   CL 105 02/13/2018 1253   CO2 30 (H) 02/13/2018 1253   CO2 25 08/16/2017 1256   GLUCOSE 96 02/13/2018 1253   GLUCOSE 88 08/16/2017 1256   BUN 20  02/13/2018 1253   BUN 19.6 08/16/2017 1256   CREATININE 0.79 02/13/2018 1253   CREATININE 0.7 08/16/2017 1256   CALCIUM 9.5 02/13/2018 1253   CALCIUM 9.2 08/16/2017 1256   PROT 7.5 02/13/2018 1253   PROT 7.5 08/16/2017 1256   ALBUMIN 3.8 02/13/2018 1253   ALBUMIN 3.6 08/16/2017 1256   AST 17 02/13/2018 1253   AST 18 08/16/2017 1256   ALT 12 02/13/2018 1253   ALT 11 08/16/2017 1256   ALKPHOS 49 02/13/2018 1253   ALKPHOS 42 08/16/2017 1256   BILITOT 0.8 02/13/2018 1253   BILITOT 0.87  08/16/2017 1256   GFRNONAA >60 02/13/2018 1253   GFRAA >60 02/13/2018 1253    No results found for: SPEP, UPEP  Lab Results  Component Value Date   WBC 14.4 (H) 06/25/2019   NEUTROABS 2.6 06/25/2019   HGB 12.6 06/25/2019   HCT 38.8 06/25/2019   MCV 102.4 (H) 06/25/2019   PLT 142 (L) 06/25/2019      Chemistry      Component Value Date/Time   NA 140 02/13/2018 1253   NA 138 08/16/2017 1256   K 4.5 02/13/2018 1253   K 4.3 08/16/2017 1256   CL 105 02/13/2018 1253   CO2 30 (H) 02/13/2018 1253   CO2 25 08/16/2017 1256   BUN 20 02/13/2018 1253   BUN 19.6 08/16/2017 1256   CREATININE 0.79 02/13/2018 1253   CREATININE 0.7 08/16/2017 1256      Component Value Date/Time   CALCIUM 9.5 02/13/2018 1253   CALCIUM 9.2 08/16/2017 1256   ALKPHOS 49 02/13/2018 1253   ALKPHOS 42 08/16/2017 1256   AST 17 02/13/2018 1253   AST 18 08/16/2017 1256   ALT 12 02/13/2018 1253   ALT 11 08/16/2017 1256   BILITOT 0.8 02/13/2018 1253   BILITOT 0.87 08/16/2017 1256      All questions were answered. The patient knows to call the clinic with any problems, questions or concerns. No barriers to learning was detected.  I spent 15 minutes counseling the patient face to face. The total time spent in the appointment was 20 minutes and more than 50% was on counseling and review of test results  Heath Lark, MD 06/25/2019 2:06 PM

## 2019-06-25 NOTE — Assessment & Plan Note (Signed)
She has mild thrombocytopenia likely secondary to cancer relapse She is not symptomatic Observe only for now As above, I recommend seeing her back in 6 months for further follow-up

## 2019-06-26 ENCOUNTER — Telehealth: Payer: Self-pay | Admitting: Hematology and Oncology

## 2019-06-26 NOTE — Telephone Encounter (Signed)
I left a message and will mail schedule

## 2019-12-07 ENCOUNTER — Other Ambulatory Visit: Payer: Self-pay | Admitting: Family Medicine

## 2019-12-07 DIAGNOSIS — M810 Age-related osteoporosis without current pathological fracture: Secondary | ICD-10-CM

## 2019-12-24 ENCOUNTER — Encounter: Payer: Self-pay | Admitting: Hematology and Oncology

## 2019-12-24 ENCOUNTER — Other Ambulatory Visit: Payer: Self-pay

## 2019-12-24 ENCOUNTER — Inpatient Hospital Stay: Payer: Medicare Other | Attending: Hematology and Oncology | Admitting: Hematology and Oncology

## 2019-12-24 ENCOUNTER — Inpatient Hospital Stay: Payer: Medicare Other

## 2019-12-24 DIAGNOSIS — D61818 Other pancytopenia: Secondary | ICD-10-CM | POA: Diagnosis not present

## 2019-12-24 DIAGNOSIS — C8588 Other specified types of non-Hodgkin lymphoma, lymph nodes of multiple sites: Secondary | ICD-10-CM

## 2019-12-24 DIAGNOSIS — Z7189 Other specified counseling: Secondary | ICD-10-CM | POA: Diagnosis not present

## 2019-12-24 DIAGNOSIS — G500 Trigeminal neuralgia: Secondary | ICD-10-CM | POA: Diagnosis not present

## 2019-12-24 LAB — CBC WITH DIFFERENTIAL/PLATELET
Abs Immature Granulocytes: 0 10*3/uL (ref 0.00–0.07)
Basophils Absolute: 0 10*3/uL (ref 0.0–0.1)
Basophils Relative: 0 %
Eosinophils Absolute: 0 10*3/uL (ref 0.0–0.5)
Eosinophils Relative: 0 %
HCT: 33.9 % — ABNORMAL LOW (ref 36.0–46.0)
Hemoglobin: 11.1 g/dL — ABNORMAL LOW (ref 12.0–15.0)
Lymphocytes Relative: 88 %
Lymphs Abs: 17.3 10*3/uL — ABNORMAL HIGH (ref 0.7–4.0)
MCH: 33.5 pg (ref 26.0–34.0)
MCHC: 32.7 g/dL (ref 30.0–36.0)
MCV: 102.4 fL — ABNORMAL HIGH (ref 80.0–100.0)
Monocytes Absolute: 0.2 10*3/uL (ref 0.1–1.0)
Monocytes Relative: 1 %
Neutro Abs: 2.2 10*3/uL (ref 1.7–7.7)
Neutrophils Relative %: 11 %
Platelets: 145 10*3/uL — ABNORMAL LOW (ref 150–400)
RBC: 3.31 MIL/uL — ABNORMAL LOW (ref 3.87–5.11)
RDW: 14.2 % (ref 11.5–15.5)
WBC: 19.7 10*3/uL — ABNORMAL HIGH (ref 4.0–10.5)
nRBC: 0 % (ref 0.0–0.2)

## 2019-12-24 LAB — COMPREHENSIVE METABOLIC PANEL
ALT: 11 U/L (ref 0–44)
AST: 24 U/L (ref 15–41)
Albumin: 3.1 g/dL — ABNORMAL LOW (ref 3.5–5.0)
Alkaline Phosphatase: 59 U/L (ref 38–126)
Anion gap: 9 (ref 5–15)
BUN: 20 mg/dL (ref 8–23)
CO2: 29 mmol/L (ref 22–32)
Calcium: 9 mg/dL (ref 8.9–10.3)
Chloride: 104 mmol/L (ref 98–111)
Creatinine, Ser: 0.86 mg/dL (ref 0.44–1.00)
GFR calc Af Amer: >60 mL/min (ref >60)
GFR calc non Af Amer: 59 mL/min — ABNORMAL LOW (ref >60)
Glucose, Bld: 84 mg/dL (ref 70–99)
Potassium: 4.5 mmol/L (ref 3.5–5.1)
Sodium: 142 mmol/L (ref 135–145)
Total Bilirubin: 0.5 mg/dL (ref 0.3–1.2)
Total Protein: 7.7 g/dL (ref 6.5–8.1)

## 2019-12-24 LAB — LACTATE DEHYDROGENASE: LDH: 383 U/L — ABNORMAL HIGH (ref 98–192)

## 2019-12-24 NOTE — Assessment & Plan Note (Signed)
She has mild pancytopenia but not symptomatic We will observe closely I plan to recheck again in 6 months

## 2019-12-24 NOTE — Assessment & Plan Note (Signed)
The disease is incurable She is quite frail right now There is no indication to treat her condition right now I will see her again in 6 months for further follow-up

## 2019-12-24 NOTE — Assessment & Plan Note (Signed)
She has symptoms of severe trigeminal neuralgia The dose of gabapentin was recently increased by her primary care doctor I will defer to them for further management

## 2019-12-24 NOTE — Progress Notes (Signed)
[place] OFFICE PROGRESS NOTE  Patient Care Team: Hilbert Bible as PCP - General (Physician Assistant)  ASSESSMENT & PLAN:  Lymphoma, marginal zone, lymph nodes of multiple sites [facility]) She is very frail She tolerated treatment poorly in the past She has signs of disease progression with mild worsening pancytopenia but generally asymptomatic She is educated to watch for signs and symptoms of disease progression For now, I recommend close observation and see me back in 6 months for further follow-up  Trigeminal neuralgia She has symptoms of severe trigeminal neuralgia The dose of gabapentin was recently increased by her primary care doctor I will defer to them for further management  Pancytopenia, acquired [facility]) She has mild pancytopenia but not symptomatic We will observe closely I plan to recheck again in 6 months  Goals of care, counseling/discussion The disease is incurable She is quite frail right now There is no indication to treat her condition right now I will see her again in 6 months for further follow-up   Orders Placed This Encounter  Procedures  . CBC with Differential/Platelet    Standing Status:   Future    Standing Expiration Date:   01/27/2021  . Comprehensive metabolic panel    Standing Status:   Future    Standing Expiration Date:   01/27/2021    All questions were answered. The patient knows to call the clinic with any problems, questions or concerns. The total time spent in the appointment was 20 minutes encounter with patients including review of chart and various tests results, discussions about plan of care and coordination of care plan   Heath Lark, MD 12/24/2019 2:11 PM  INTERVAL HISTORY: Please see below for problem oriented charting. She returns for further follow-up Since last time I saw her, she have worsening trigeminal neuralgia She just saw her primary care doctor several days ago and had gabapentin dose  increased She had a fall several weeks ago but did not have major injuries She denies new lymphadenopathy, anorexia, abnormal weight loss or night sweats The patient denies any recent signs or symptoms of bleeding such as spontaneous epistaxis, hematuria or hematochezia. Denies recent infection, fever or chills  SUMMARY OF ONCOLOGIC HISTORY: Oncology History  Lymphoma, marginal zone, lymph nodes of multiple sites ([place])  10/26/2011 Initial Diagnosis   The patient was diagnosed with marginal zone lymphoma/lymphoplasmacytic lymphoma in [place].  She received single agent rituximab,  weekly 4, completed by May 2013.  She was subsequently found to have disease relapse, and was treated with single agent bendamustine from January 2014 to June 2014, 70 mg/m in [place]   05/12/2013 Imaging   Outside CT showed no evidence of disease   11/18/2014 Imaging   Outside CT scan show no evidence of disease   11/03/2015 Pathology Results   Peripheral Blood Flow Cytometry - MONOCLONAL B CELL POPULATION IDENTIFIED. - SEE COMMENT. Diagnosis Comment: The majority of lymphocytes consist of a monoclonal population of B cells expressing pan B cell antigens including CD20 with associated expression of CD5, CD25, CD11c, and FMC7. No CD10 or CD103 expression is identified. The peripheral blood shows a population of atypical lymphocytes ranging from small to large cells with scanty to moderately abundant cytoplasm, dense to partially clumped chromatin and small to inconspicuous nucleoli. The findings are consistent with a B-cell lymphoproliferative process. Consideration should be given to chronic lymphocytic leukemia (variant or transformation), mantle cell lymphoma and other B cell lymphomas with rare CD5 expression. FISH studies may be  helpful in this regard   01/04/2017 PET scan   1. Mildly enlarged retroperitoneal lymph nodes are present including a 1.4 cm node anterior to the aortic bifurcation which demonstrates  low-grade metabolic activity with maximum SUV 2.5. This would be considered Deauville 3 activity. 2. Splenomegaly with splenic volume of 540 cc, but no focal splenic lesions seen. 3. Other imaging findings of potential clinical significance: Considerable atherosclerosis. Bilateral nonobstructive nephrolithiasis. Extensive sigmoid colon diverticulosis. Pelvic floor laxity. Right shoulder prosthesis with activity along the joint margins likely inflammatory. Old pelvic fractures with sclerotic late phase healing response.   02/06/2017 - 02/07/2017 Chemotherapy   She only received 1 cycle of treatment. Treatment was stopped due to very poor tolerance   05/21/2017 PET scan   1. Response to therapy of abdominal retroperitoneal nodes. Resolution of splenomegaly. 2. No new or progressive disease identified. 3. Hypermetabolism about the lateral right chest wall is favored to be artifactual, adjacent to right shoulder arthroplasty. Consider physical exam correlation. Similarly, right paraspinous mild hypermetabolism within the chest is favored to be due to motion after pharmaceutical injection or possibly prior muscular strain. 4. Incidental findings, including cholelithiasis and right nephrolithiasis.     REVIEW OF SYSTEMS:   Constitutional: Denies fevers, chills or abnormal weight loss Eyes: Denies blurriness of vision Ears, nose, mouth, throat, and face: Denies mucositis or sore throat Respiratory: Denies cough, dyspnea or wheezes Cardiovascular: Denies palpitation, chest discomfort or lower extremity swelling Gastrointestinal:  Denies nausea, heartburn or change in bowel habits Skin: Denies abnormal skin rashes Lymphatics: Denies new lymphadenopathy or easy bruising Behavioral/Psych: Mood is stable, no new changes  All other systems were reviewed with the patient and are negative.  I have reviewed the past medical history, past surgical history, social history and family history with the patient and  they are unchanged from previous note.  ALLERGIES:  is allergic to penicillins; tegretol [carbamazepine]; and lyrica [pregabalin].  MEDICATIONS:  Current Outpatient Medications  Medication Sig Dispense Refill  . gabapentin (NEURONTIN) 400 MG capsule Take 800 mg by mouth 3 (three) times daily.  0  . alendronate (FOSAMAX) 70 MG tablet Take 70 mg by mouth once a week.  2  . aspirin 81 MG tablet Take 81 mg by mouth at bedtime.     . Biotin 10 MG CAPS Take 10 mg by mouth daily.    . calcium-vitamin D (OSCAL WITH D) 500-200 MG-UNIT tablet Take 1 tablet by mouth daily.     Marland Kitchen ipratropium (ATROVENT) 0.06 % nasal spray Place 2 sprays into the nose as needed.  5  . levothyroxine (SYNTHROID, LEVOTHROID) 88 MCG tablet Take 88 mcg by mouth daily. Six days per week  1  . Meclizine HCl 25 MG CHEW Chew 25 mg by mouth as needed.    . Multiple Vitamin (MULTI-VITAMINS) TABS Take 1 tablet by mouth daily.    . propranolol (INDERAL) 10 MG tablet Take 10 mg by mouth daily.     No current facility-administered medications for this visit.    PHYSICAL EXAMINATION: ECOG PERFORMANCE STATUS: 2 - Symptomatic, <50% confined to bed  Vitals:   12/24/19 1359  BP: 112/60  Pulse: 75  Resp: 18  Temp: 98.5 F (36.9 C)  SpO2: 95%   Filed Weights   12/24/19 1358 12/24/19 1359  Weight: 125 lb 6.4 oz (56.9 kg) 129 lb 9.6 oz (58.8 kg)    GENERAL:alert, no distress and comfortable.  She looks thin and frail SKIN: skin color, texture, turgor are normal,  no rashes or significant lesions EYES: normal, Conjunctiva are pink and non-injected, sclera clear OROPHARYNX:no exudate, no erythema and lips, buccal mucosa, and tongue normal  NECK: supple, thyroid normal size, non-tender, without nodularity LYMPH:  no palpable lymphadenopathy in the cervical, axillary or inguinal LUNGS: clear to auscultation and percussion with normal breathing effort HEART: regular rate & rhythm and no murmurs and no lower extremity  edema ABDOMEN:abdomen soft, non-tender and normal bowel sounds Musculoskeletal:no cyanosis of digits and no clubbing  NEURO: alert & oriented x 3 with fluent speech, no focal motor/sensory deficits  LABORATORY DATA:  I have reviewed the data as listed    Component Value Date/Time   NA 142 12/24/2019 1331   NA 138 08/16/2017 1256   K 4.5 12/24/2019 1331   K 4.3 08/16/2017 1256   CL 104 12/24/2019 1331   CO2 29 12/24/2019 1331   CO2 25 08/16/2017 1256   GLUCOSE 84 12/24/2019 1331   GLUCOSE 88 08/16/2017 1256   BUN 20 12/24/2019 1331   BUN 19.6 08/16/2017 1256   CREATININE 0.86 12/24/2019 1331   CREATININE 0.7 08/16/2017 1256   CALCIUM 9.0 12/24/2019 1331   CALCIUM 9.2 08/16/2017 1256   PROT 7.7 12/24/2019 1331   PROT 7.5 08/16/2017 1256   ALBUMIN 3.1 (L) 12/24/2019 1331   ALBUMIN 3.6 08/16/2017 1256   AST 24 12/24/2019 1331   AST 18 08/16/2017 1256   ALT 11 12/24/2019 1331   ALT 11 08/16/2017 1256   ALKPHOS 59 12/24/2019 1331   ALKPHOS 42 08/16/2017 1256   BILITOT 0.5 12/24/2019 1331   BILITOT 0.87 08/16/2017 1256   GFRNONAA 59 (L) 12/24/2019 1331   GFRAA >60 12/24/2019 1331    No results found for: SPEP, UPEP  Lab Results  Component Value Date   WBC 19.7 (H) 12/24/2019   NEUTROABS PENDING 12/24/2019   HGB 11.1 (L) 12/24/2019   HCT 33.9 (L) 12/24/2019   MCV 102.4 (H) 12/24/2019   PLT 145 (L) 12/24/2019      Chemistry      Component Value Date/Time   NA 142 12/24/2019 1331   NA 138 08/16/2017 1256   K 4.5 12/24/2019 1331   K 4.3 08/16/2017 1256   CL 104 12/24/2019 1331   CO2 29 12/24/2019 1331   CO2 25 08/16/2017 1256   BUN 20 12/24/2019 1331   BUN 19.6 08/16/2017 1256   CREATININE 0.86 12/24/2019 1331   CREATININE 0.7 08/16/2017 1256      Component Value Date/Time   CALCIUM 9.0 12/24/2019 1331   CALCIUM 9.2 08/16/2017 1256   ALKPHOS 59 12/24/2019 1331   ALKPHOS 42 08/16/2017 1256   AST 24 12/24/2019 1331   AST 18 08/16/2017 1256   ALT 11  12/24/2019 1331   ALT 11 08/16/2017 1256   BILITOT 0.5 12/24/2019 1331   BILITOT 0.87 08/16/2017 1256

## 2019-12-24 NOTE — Assessment & Plan Note (Signed)
She is very frail She tolerated treatment poorly in the past She has signs of disease progression with mild worsening pancytopenia but generally asymptomatic She is educated to watch for signs and symptoms of disease progression For now, I recommend close observation and see me back in 6 months for further follow-up

## 2019-12-25 ENCOUNTER — Telehealth: Payer: Self-pay | Admitting: Hematology and Oncology

## 2019-12-25 NOTE — Telephone Encounter (Signed)
Scheduled appt per 3/25 los. Left voicemail with appt details. Mailed reminder letter and calendar.

## 2020-02-25 ENCOUNTER — Ambulatory Visit
Admission: RE | Admit: 2020-02-25 | Discharge: 2020-02-25 | Disposition: A | Discharge status: Home / Self Care | Payer: Medicare Other | Source: Ambulatory Visit | Attending: Family Medicine | Admitting: Family Medicine

## 2020-02-25 ENCOUNTER — Other Ambulatory Visit: Payer: Self-pay

## 2020-02-25 DIAGNOSIS — M810 Age-related osteoporosis without current pathological fracture: Secondary | ICD-10-CM

## 2020-03-03 ENCOUNTER — Telehealth: Payer: Self-pay

## 2020-03-03 NOTE — Telephone Encounter (Signed)
Called and reviewed upcoming appt time on 6/7. She verbalized understanding.

## 2020-03-07 ENCOUNTER — Inpatient Hospital Stay: Payer: Medicare Other | Attending: Hematology and Oncology | Admitting: Hematology and Oncology

## 2020-03-07 ENCOUNTER — Encounter: Payer: Self-pay | Admitting: Hematology and Oncology

## 2020-03-07 ENCOUNTER — Other Ambulatory Visit: Payer: Self-pay

## 2020-03-07 DIAGNOSIS — G500 Trigeminal neuralgia: Secondary | ICD-10-CM | POA: Diagnosis not present

## 2020-03-07 DIAGNOSIS — C8588 Other specified types of non-Hodgkin lymphoma, lymph nodes of multiple sites: Secondary | ICD-10-CM | POA: Diagnosis present

## 2020-03-07 DIAGNOSIS — R600 Localized edema: Secondary | ICD-10-CM | POA: Diagnosis not present

## 2020-03-07 DIAGNOSIS — R609 Edema, unspecified: Secondary | ICD-10-CM | POA: Insufficient documentation

## 2020-03-07 DIAGNOSIS — D61818 Other pancytopenia: Secondary | ICD-10-CM | POA: Insufficient documentation

## 2020-03-07 NOTE — Progress Notes (Signed)
[place] OFFICE PROGRESS NOTE  Patient Care Team: Hilbert Bible as PCP - General (Physician Assistant)  ASSESSMENT & PLAN:  Lymphoma, marginal zone, lymph nodes of multiple sites [facility]) She is very frail She tolerated treatment poorly in the past She has a few signs of disease progression with mild worsening pancytopenia but generally asymptomatic She is educated to watch for signs and symptoms of disease progression Her examination is benign I will see her again in 3 months with repeat blood work for further follow-up  Pancytopenia, acquired ([place]) She has mild pancytopenia but not symptomatic We will observe closely I plan to recheck again in 3 months  Trigeminal neuralgia She has symptoms of severe trigeminal neuralgia She felt that the pain is not well controlled with current dose of gabapentin I recommend she contact her primary care doctor for medication adjustment  Bilateral leg edema The cause is unknown, multifactorial, could be related to poor protein intake or sedentary lifestyle I recommend she gets her blood work including thyroid function test checked with her primary care doctor   No orders of the defined types were placed in this encounter.   All questions were answered. The patient knows to call the clinic with any problems, questions or concerns. The total time spent in the appointment was 20 minutes encounter with patients including review of chart and various tests results, discussions about plan of care and coordination of care plan   Heath Lark, MD 03/07/2020 1:52 PM  INTERVAL HISTORY: Please see below for problem oriented charting. She returns for further follow-up She is doing better since last time I saw her She has no further weight loss Appetite is good She complained of bilateral lower extremity edema She complained that the trigeminal neuralgia is not well controlled No recent infection, fever or chills  SUMMARY OF  ONCOLOGIC HISTORY: Oncology History  Lymphoma, marginal zone, lymph nodes of multiple sites ([place])  10/26/2011 Initial Diagnosis   The patient was diagnosed with marginal zone lymphoma/lymphoplasmacytic lymphoma in [place].  She received single agent rituximab,  weekly 4, completed by May 2013.  She was subsequently found to have disease relapse, and was treated with single agent bendamustine from January 2014 to June 2014, 70 mg/m in [place]   05/12/2013 Imaging   Outside CT showed no evidence of disease   11/18/2014 Imaging   Outside CT scan show no evidence of disease   11/03/2015 Pathology Results   Peripheral Blood Flow Cytometry - MONOCLONAL B CELL POPULATION IDENTIFIED. - SEE COMMENT. Diagnosis Comment: The majority of lymphocytes consist of a monoclonal population of B cells expressing pan B cell antigens including CD20 with associated expression of CD5, CD25, CD11c, and FMC7. No CD10 or CD103 expression is identified. The peripheral blood shows a population of atypical lymphocytes ranging from small to large cells with scanty to moderately abundant cytoplasm, dense to partially clumped chromatin and small to inconspicuous nucleoli. The findings are consistent with a B-cell lymphoproliferative process. Consideration should be given to chronic lymphocytic leukemia (variant or transformation), mantle cell lymphoma and other B cell lymphomas with rare CD5 expression. FISH studies may be helpful in this regard   01/04/2017 PET scan   1. Mildly enlarged retroperitoneal lymph nodes are present including a 1.4 cm node anterior to the aortic bifurcation which demonstrates low-grade metabolic activity with maximum SUV 2.5. This would be considered Deauville 3 activity. 2. Splenomegaly with splenic volume of 540 cc, but no focal splenic lesions seen. 3. Other  imaging findings of potential clinical significance: Considerable atherosclerosis. Bilateral nonobstructive nephrolithiasis. Extensive  sigmoid colon diverticulosis. Pelvic floor laxity. Right shoulder prosthesis with activity along the joint margins likely inflammatory. Old pelvic fractures with sclerotic late phase healing response.   02/06/2017 - 02/07/2017 Chemotherapy   She only received 1 cycle of treatment. Treatment was stopped due to very poor tolerance   05/21/2017 PET scan   1. Response to therapy of abdominal retroperitoneal nodes. Resolution of splenomegaly. 2. No new or progressive disease identified. 3. Hypermetabolism about the lateral right chest wall is favored to be artifactual, adjacent to right shoulder arthroplasty. Consider physical exam correlation. Similarly, right paraspinous mild hypermetabolism within the chest is favored to be due to motion after pharmaceutical injection or possibly prior muscular strain. 4. Incidental findings, including cholelithiasis and right nephrolithiasis.     REVIEW OF SYSTEMS:   Constitutional: Denies fevers, chills or abnormal weight loss Eyes: Denies blurriness of vision Ears, nose, mouth, throat, and face: Denies mucositis or sore throat Respiratory: Denies cough, dyspnea or wheezes Cardiovascular: Denies palpitation, chest discomfort or lower extremity swelling Gastrointestinal:  Denies nausea, heartburn or change in bowel habits Skin: Denies abnormal skin rashes Lymphatics: Denies new lymphadenopathy or easy bruising Behavioral/Psych: Mood is stable, no new changes  All other systems were reviewed with the patient and are negative.  I have reviewed the past medical history, past surgical history, social history and family history with the patient and they are unchanged from previous note.  ALLERGIES:  is allergic to penicillins; tegretol [carbamazepine]; and lyrica [pregabalin].  MEDICATIONS:  Current Outpatient Medications  Medication Sig Dispense Refill  . alendronate (FOSAMAX) 70 MG tablet Take 70 mg by mouth once a week.  2  . aspirin 81 MG tablet Take 81 mg  by mouth at bedtime.     . Biotin 10 MG CAPS Take 10 mg by mouth daily.    . calcium-vitamin D (OSCAL WITH D) 500-200 MG-UNIT tablet Take 1 tablet by mouth daily.     Marland Kitchen gabapentin (NEURONTIN) 400 MG capsule Take 800 mg by mouth 3 (three) times daily.  0  . ipratropium (ATROVENT) 0.06 % nasal spray Place 2 sprays into the nose as needed.  5  . levothyroxine (SYNTHROID, LEVOTHROID) 88 MCG tablet Take 88 mcg by mouth daily. Six days per week  1  . Meclizine HCl 25 MG CHEW Chew 25 mg by mouth as needed.    . Multiple Vitamin (MULTI-VITAMINS) TABS Take 1 tablet by mouth daily.    . propranolol (INDERAL) 10 MG tablet Take 10 mg by mouth daily.     No current facility-administered medications for this visit.    PHYSICAL EXAMINATION: ECOG PERFORMANCE STATUS: 1 - Symptomatic but completely ambulatory  Vitals:   03/07/20 1310  BP: (!) 123/55  Pulse: 82  Resp: 18  Temp: 98.5 F (36.9 C)  SpO2: 96%   Filed Weights   03/07/20 1310  Weight: 131 lb 9.6 oz (59.7 kg)    GENERAL:alert, no distress and comfortable SKIN: skin color, texture, turgor are normal, no rashes or significant lesions EYES: normal, Conjunctiva are pink and non-injected, sclera clear OROPHARYNX:no exudate, no erythema and lips, buccal mucosa, and tongue normal  NECK: supple, thyroid normal size, non-tender, without nodularity LYMPH:  no palpable lymphadenopathy in the cervical, axillary or inguinal LUNGS: clear to auscultation and percussion with normal breathing effort HEART: regular rate & rhythm and no murmurs with mild bilateral leg edema ABDOMEN:abdomen soft, non-tender and normal bowel sounds  Musculoskeletal:no cyanosis of digits and no clubbing  NEURO: alert & oriented x 3 with fluent speech, no focal motor/sensory deficits  LABORATORY DATA:  I have reviewed the data as listed    Component Value Date/Time   NA 142 12/24/2019 1331   NA 138 08/16/2017 1256   K 4.5 12/24/2019 1331   K 4.3 08/16/2017 1256   CL  104 12/24/2019 1331   CO2 29 12/24/2019 1331   CO2 25 08/16/2017 1256   GLUCOSE 84 12/24/2019 1331   GLUCOSE 88 08/16/2017 1256   BUN 20 12/24/2019 1331   BUN 19.6 08/16/2017 1256   CREATININE 0.86 12/24/2019 1331   CREATININE 0.7 08/16/2017 1256   CALCIUM 9.0 12/24/2019 1331   CALCIUM 9.2 08/16/2017 1256   PROT 7.7 12/24/2019 1331   PROT 7.5 08/16/2017 1256   ALBUMIN 3.1 (L) 12/24/2019 1331   ALBUMIN 3.6 08/16/2017 1256   AST 24 12/24/2019 1331   AST 18 08/16/2017 1256   ALT 11 12/24/2019 1331   ALT 11 08/16/2017 1256   ALKPHOS 59 12/24/2019 1331   ALKPHOS 42 08/16/2017 1256   BILITOT 0.5 12/24/2019 1331   BILITOT 0.87 08/16/2017 1256   GFRNONAA 59 (L) 12/24/2019 1331   GFRAA >60 12/24/2019 1331    No results found for: SPEP, UPEP  Lab Results  Component Value Date   WBC 19.7 (H) 12/24/2019   NEUTROABS 2.2 12/24/2019   HGB 11.1 (L) 12/24/2019   HCT 33.9 (L) 12/24/2019   MCV 102.4 (H) 12/24/2019   PLT 145 (L) 12/24/2019      Chemistry      Component Value Date/Time   NA 142 12/24/2019 1331   NA 138 08/16/2017 1256   K 4.5 12/24/2019 1331   K 4.3 08/16/2017 1256   CL 104 12/24/2019 1331   CO2 29 12/24/2019 1331   CO2 25 08/16/2017 1256   BUN 20 12/24/2019 1331   BUN 19.6 08/16/2017 1256   CREATININE 0.86 12/24/2019 1331   CREATININE 0.7 08/16/2017 1256      Component Value Date/Time   CALCIUM 9.0 12/24/2019 1331   CALCIUM 9.2 08/16/2017 1256   ALKPHOS 59 12/24/2019 1331   ALKPHOS 42 08/16/2017 1256   AST 24 12/24/2019 1331   AST 18 08/16/2017 1256   ALT 11 12/24/2019 1331   ALT 11 08/16/2017 1256   BILITOT 0.5 12/24/2019 1331   BILITOT 0.87 08/16/2017 1256       RADIOGRAPHIC STUDIES: I have personally reviewed the radiological images as listed and agreed with the findings in the report. DG BONE DENSITY (DXA)  Result Date: 02/26/2020 EXAM: DUAL X-RAY ABSORPTIOMETRY (DXA) FOR BONE MINERAL DENSITY IMPRESSION: Referring Physician:  Cari Caraway W  Your patient completed a BMD test using Lunar IDXA DXA system ( analysis version: 16 ) manufactured by [employer]. Technologist: AW PATIENT: Name: Joy Lynch, Joy Lynch Patient ID: [id number] Birth Date: 1926/04/20 Height: 65.0 in. Sex: Female Measured: 02/25/2020 Weight: 131.0 lbs. Indications: Advanced Age, Caucasian, Estrogen Deficient, Leukemia, Postmenopausal Fractures: Left wrist Treatments: Calcium (E943.0), Vitamin D (E933.5) ASSESSMENT: The BMD measured at Forearm Radius 33% is 0.548 g/cm2 with a T-score of -3.8. This patient is considered osteoporotic according to [facility]) criteria. The scan quality is good. Lumbar spine was not utilized due to advanced degenerative changes. Site Region Measured Date Measured Age YA BMD Significant CHANGE T-score Right Forearm Radius 33% 02/25/2020 92.3 -3.8 0.548 g/cm2 * Right Forearm Radius 33% 10/25/2017 89.9 -3.1 0.611 g/cm2 DualFemur Total Left 4/25/2020  92.3 -2.3 0.714 g/cm2 DualFemur Total Left 10/25/2017 89.9 -2.4 0.705 g/cm2 DualFemur Total Mean 02/25/2020 92.3 -2.3 0.717 g/cm2 DualFemur Total Mean 10/25/2017 89.9 -2.5 0.696 g/cm2 World Health Organization [facility]) criteria for post-menopausal, Caucasian Women: Normal       T-score at or above -1 SD Osteopenia   T-score between -1 and -2.5 SD Osteoporosis T-score at or below -2.5 SD RECOMMENDATION: 1. All patients should optimize calcium and vitamin D intake. 2. Consider FDA approved medical therapies in postmenopausal women and men aged 31 years and older, based on the following: a. A hip or vertebral (clinical or morphometric) fracture b. T- score < or = -2.5 at the femoral neck or spine after appropriate evaluation to exclude secondary causes c. Low bone mass (T-score between -1.0 and -2.5 at the femoral neck or spine) and a 10 year probability of a hip fracture > or = 3% or a 10 year probability of a major osteoporosis-related fracture > or = 20% based on the [place]-adapted WHO algorithm d.  Clinician judgment and/or patient preferences may indicate treatment for people with 10-year fracture probabilities above or below these levels FOLLOW-UP: Patients with diagnosis of osteoporosis or at high risk for fracture should have regular bone mineral density tests. For patients eligible for Medicare, routine testing is allowed once every 2 years. The testing frequency can be increased to one year for patients who have rapidly progressing disease, those who are receiving or discontinuing medical therapy to restore bone mass, or have additional risk factors. I have reviewed this report and agree with the above findings. [facility] Radiology Electronically Signed   By: Lowella Grip III M.D.   On: 02/26/2020 10:07

## 2020-03-07 NOTE — Assessment & Plan Note (Signed)
She is very frail She tolerated treatment poorly in the past She has a few signs of disease progression with mild worsening pancytopenia but generally asymptomatic She is educated to watch for signs and symptoms of disease progression Her examination is benign I will see her again in 3 months with repeat blood work for further follow-up

## 2020-03-07 NOTE — Assessment & Plan Note (Signed)
She has symptoms of severe trigeminal neuralgia She felt that the pain is not well controlled with current dose of gabapentin I recommend she contact her primary care doctor for medication adjustment

## 2020-03-07 NOTE — Assessment & Plan Note (Signed)
She has mild pancytopenia but not symptomatic We will observe closely I plan to recheck again in 3 months

## 2020-03-07 NOTE — Assessment & Plan Note (Signed)
The cause is unknown, multifactorial, could be related to poor protein intake or sedentary lifestyle I recommend she gets her blood work including thyroid function test checked with her primary care doctor

## 2020-06-27 ENCOUNTER — Inpatient Hospital Stay: Payer: Medicare Other | Attending: Hematology and Oncology | Admitting: Hematology and Oncology

## 2020-06-27 ENCOUNTER — Other Ambulatory Visit: Payer: Self-pay

## 2020-06-27 ENCOUNTER — Encounter: Payer: Self-pay | Admitting: Hematology and Oncology

## 2020-06-27 ENCOUNTER — Telehealth: Payer: Self-pay | Admitting: Hematology and Oncology

## 2020-06-27 ENCOUNTER — Inpatient Hospital Stay: Payer: Medicare Other

## 2020-06-27 DIAGNOSIS — C8308 Small cell B-cell lymphoma, lymph nodes of multiple sites: Secondary | ICD-10-CM | POA: Insufficient documentation

## 2020-06-27 DIAGNOSIS — Z7189 Other specified counseling: Secondary | ICD-10-CM

## 2020-06-27 DIAGNOSIS — C8588 Other specified types of non-Hodgkin lymphoma, lymph nodes of multiple sites: Secondary | ICD-10-CM

## 2020-06-27 DIAGNOSIS — G500 Trigeminal neuralgia: Secondary | ICD-10-CM

## 2020-06-27 LAB — COMPREHENSIVE METABOLIC PANEL
ALT: 11 U/L (ref 0–44)
AST: 27 U/L (ref 15–41)
Albumin: 1.8 g/dL — ABNORMAL LOW (ref 3.5–5.0)
Alkaline Phosphatase: 73 U/L (ref 38–126)
Anion gap: 4 — ABNORMAL LOW (ref 5–15)
BUN: 20 mg/dL (ref 8–23)
CO2: 31 mmol/L (ref 22–32)
Calcium: 8.7 mg/dL — ABNORMAL LOW (ref 8.9–10.3)
Chloride: 104 mmol/L (ref 98–111)
Creatinine, Ser: 0.92 mg/dL (ref 0.44–1.00)
GFR calc Af Amer: >60 mL/min (ref >60)
GFR calc non Af Amer: 54 mL/min — ABNORMAL LOW (ref >60)
Glucose, Bld: 97 mg/dL (ref 70–99)
Potassium: 4.6 mmol/L (ref 3.5–5.1)
Sodium: 139 mmol/L (ref 135–145)
Total Bilirubin: 0.4 mg/dL (ref 0.3–1.2)
Total Protein: 7.5 g/dL (ref 6.5–8.1)

## 2020-06-27 LAB — CBC WITH DIFFERENTIAL/PLATELET
Abs Immature Granulocytes: 0 10*3/uL (ref 0.00–0.07)
Basophils Absolute: 0 10*3/uL (ref 0.0–0.1)
Basophils Relative: 0 %
Eosinophils Absolute: 0.7 10*3/uL — ABNORMAL HIGH (ref 0.0–0.5)
Eosinophils Relative: 2 %
HCT: 32.7 % — ABNORMAL LOW (ref 36.0–46.0)
Hemoglobin: 10.7 g/dL — ABNORMAL LOW (ref 12.0–15.0)
Lymphocytes Relative: 74 %
Lymphs Abs: 26.3 10*3/uL — ABNORMAL HIGH (ref 0.7–4.0)
MCH: 33.3 pg (ref 26.0–34.0)
MCHC: 32.7 g/dL (ref 30.0–36.0)
MCV: 101.9 fL — ABNORMAL HIGH (ref 80.0–100.0)
Monocytes Absolute: 2.5 10*3/uL — ABNORMAL HIGH (ref 0.1–1.0)
Monocytes Relative: 7 %
Neutro Abs: 6.1 10*3/uL (ref 1.7–7.7)
Neutrophils Relative %: 17 %
Platelets: 164 10*3/uL (ref 150–400)
RBC: 3.21 MIL/uL — ABNORMAL LOW (ref 3.87–5.11)
RDW: 15.1 % (ref 11.5–15.5)
WBC: 35.6 10*3/uL — ABNORMAL HIGH (ref 4.0–10.5)
nRBC: 0 % (ref 0.0–0.2)

## 2020-06-27 NOTE — Progress Notes (Signed)
[place] OFFICE PROGRESS NOTE  Patient Care Team: Hilbert Bible as PCP - General (Physician Assistant)  ASSESSMENT & PLAN:  Lymphoma, marginal zone, lymph nodes of multiple sites [facility]) She is very frail She tolerated treatment poorly in the past She has a few signs of disease progression with mild worsening pancytopenia but generally asymptomatic It is not clear how much of her current anemia contribute to her symptoms of excessive fatigue We discussed the risk and benefits of treatment The patient wants "a simple treatment" that could prolong her life with no side effects if possible that she can tolerate better Unfortunately, there is no such thing as a simple treatment with minimum side effects Even with single agent rituximab/immunotherapy, it is not clear whether she can tolerate that well or not Sometimes, complication from treatment can also shorten her life We have a prolonged discussion about goals of care Ultimately, she is undecided about treatment We also discussed potential referral for her to obtain second opinion but the patient declined I will see her again in 3 months with repeat blood work for further follow-up  Trigeminal neuralgia She has persistent symptoms of severe trigeminal neuralgia She will continue taking medication as prescribed by her primary doctor  Goals of care, counseling/discussion The disease is incurable She is very frail and is unlikely going to get stronger There is no indication to treat her condition right now; however, given progressive anemia, I suspect she will need treatment within the next 6 months or so We discussed prognosis with or without treatment With her age and her frail status, I am not certain she can tolerate any further treatment She is aware that treatment goal is strictly palliative in nature While treatment can potentially prolong her life, it can be associated with side effects that could impact  her quality of life I asked the patient whether it is acceptable not to come back for further treatment, assuming that she is not willing to undergo further treatment Apparently, it is not an acceptable choice for the patient to progress on disease without treatment Her ideal way of dying would be when she is very comfortable, without her disease causing complications and when she is ready to die Unfortunately, I cannot meet her expectation I will see her back in 3 months for further follow-up   No orders of the defined types were placed in this encounter.   All questions were answered. The patient knows to call the clinic with any problems, questions or concerns. The total time spent in the appointment was 30 minutes encounter with patients including review of chart and various tests results, discussions about plan of care and coordination of care plan   Heath Lark, MD 06/27/2020 1:35 PM  INTERVAL HISTORY: Please see below for problem oriented charting. She returns for further follow-up She complained of excessive fatigue Her appetite is poor but she has not lost weight She is still bothered by symptoms of trigeminal neuralgia We spent a significant portion of our discussion today about treatment options and goals of care  SUMMARY OF ONCOLOGIC HISTORY: Oncology History  Lymphoma, marginal zone, lymph nodes of multiple sites ([place])  10/26/2011 Initial Diagnosis   The patient was diagnosed with marginal zone lymphoma/lymphoplasmacytic lymphoma in [place].  She received single agent rituximab,  weekly 4, completed by May 2013.  She was subsequently found to have disease relapse, and was treated with single agent bendamustine from January 2014 to June 2014, 70 mg/m in [place]  05/12/2013 Imaging   Outside CT showed no evidence of disease   11/18/2014 Imaging   Outside CT scan show no evidence of disease   11/03/2015 Pathology Results   Peripheral Blood Flow Cytometry - MONOCLONAL  B CELL POPULATION IDENTIFIED. - SEE COMMENT. Diagnosis Comment: The majority of lymphocytes consist of a monoclonal population of B cells expressing pan B cell antigens including CD20 with associated expression of CD5, CD25, CD11c, and FMC7. No CD10 or CD103 expression is identified. The peripheral blood shows a population of atypical lymphocytes ranging from small to large cells with scanty to moderately abundant cytoplasm, dense to partially clumped chromatin and small to inconspicuous nucleoli. The findings are consistent with a B-cell lymphoproliferative process. Consideration should be given to chronic lymphocytic leukemia (variant or transformation), mantle cell lymphoma and other B cell lymphomas with rare CD5 expression. FISH studies may be helpful in this regard   01/04/2017 PET scan   1. Mildly enlarged retroperitoneal lymph nodes are present including a 1.4 cm node anterior to the aortic bifurcation which demonstrates low-grade metabolic activity with maximum SUV 2.5. This would be considered Deauville 3 activity. 2. Splenomegaly with splenic volume of 540 cc, but no focal splenic lesions seen. 3. Other imaging findings of potential clinical significance: Considerable atherosclerosis. Bilateral nonobstructive nephrolithiasis. Extensive sigmoid colon diverticulosis. Pelvic floor laxity. Right shoulder prosthesis with activity along the joint margins likely inflammatory. Old pelvic fractures with sclerotic late phase healing response.   02/06/2017 - 02/07/2017 Chemotherapy   She only received 1 cycle of treatment. Treatment was stopped due to very poor tolerance   05/21/2017 PET scan   1. Response to therapy of abdominal retroperitoneal nodes. Resolution of splenomegaly. 2. No new or progressive disease identified. 3. Hypermetabolism about the lateral right chest wall is favored to be artifactual, adjacent to right shoulder arthroplasty. Consider physical exam correlation. Similarly, right  paraspinous mild hypermetabolism within the chest is favored to be due to motion after pharmaceutical injection or possibly prior muscular strain. 4. Incidental findings, including cholelithiasis and right nephrolithiasis.     REVIEW OF SYSTEMS:   Constitutional: Denies fevers, chills or abnormal weight loss Eyes: Denies blurriness of vision Ears, nose, mouth, throat, and face: Denies mucositis or sore throat Respiratory: Denies cough, dyspnea or wheezes Cardiovascular: Denies palpitation, chest discomfort or lower extremity swelling Gastrointestinal:  Denies nausea, heartburn or change in bowel habits Skin: Denies abnormal skin rashes Lymphatics: Denies new lymphadenopathy or easy bruising Neurological:Denies numbness, tingling or new weaknesses Behavioral/Psych: Mood is stable, no new changes  All other systems were reviewed with the patient and are negative.  I have reviewed the past medical history, past surgical history, social history and family history with the patient and they are unchanged from previous note.  ALLERGIES:  is allergic to penicillins, tegretol [carbamazepine], and lyrica [pregabalin].  MEDICATIONS:  Current Outpatient Medications  Medication Sig Dispense Refill  . alendronate (FOSAMAX) 70 MG tablet Take 70 mg by mouth once a week.  2  . aspirin 81 MG tablet Take 81 mg by mouth at bedtime.     . Biotin 10 MG CAPS Take 10 mg by mouth daily.    . calcium-vitamin D (OSCAL WITH D) 500-200 MG-UNIT tablet Take 1 tablet by mouth daily.     Marland Kitchen gabapentin (NEURONTIN) 400 MG capsule Take 800 mg by mouth 3 (three) times daily.  0  . ipratropium (ATROVENT) 0.06 % nasal spray Place 2 sprays into the nose as needed.  5  . levothyroxine (  SYNTHROID, LEVOTHROID) 88 MCG tablet Take 88 mcg by mouth daily. Six days per week  1  . Meclizine HCl 25 MG CHEW Chew 25 mg by mouth as needed.    . Multiple Vitamin (MULTI-VITAMINS) TABS Take 1 tablet by mouth daily.    . propranolol  (INDERAL) 10 MG tablet Take 10 mg by mouth daily.     No current facility-administered medications for this visit.    PHYSICAL EXAMINATION: ECOG PERFORMANCE STATUS: 1 - Symptomatic but completely ambulatory  Vitals:   06/27/20 1059  BP: (!) 90/53  Pulse: 90  Resp: 18  Temp: (!) 97.4 F (36.3 C)  SpO2: 90%   Filed Weights   06/27/20 1059  Weight: 136 lb 11.2 oz (62 kg)    GENERAL:alert, no distress and comfortable.  She looks weak and frail   NEURO: alert & oriented x 3 with fluent speech, no focal motor/sensory deficits  LABORATORY DATA:  I have reviewed the data as listed    Component Value Date/Time   NA 139 06/27/2020 1025   NA 138 08/16/2017 1256   K 4.6 06/27/2020 1025   K 4.3 08/16/2017 1256   CL 104 06/27/2020 1025   CO2 31 06/27/2020 1025   CO2 25 08/16/2017 1256   GLUCOSE 97 06/27/2020 1025   GLUCOSE 88 08/16/2017 1256   BUN 20 06/27/2020 1025   BUN 19.6 08/16/2017 1256   CREATININE 0.92 06/27/2020 1025   CREATININE 0.7 08/16/2017 1256   CALCIUM 8.7 (L) 06/27/2020 1025   CALCIUM 9.2 08/16/2017 1256   PROT 7.5 06/27/2020 1025   PROT 7.5 08/16/2017 1256   ALBUMIN 1.8 (L) 06/27/2020 1025   ALBUMIN 3.6 08/16/2017 1256   AST 27 06/27/2020 1025   AST 18 08/16/2017 1256   ALT 11 06/27/2020 1025   ALT 11 08/16/2017 1256   ALKPHOS 73 06/27/2020 1025   ALKPHOS 42 08/16/2017 1256   BILITOT 0.4 06/27/2020 1025   BILITOT 0.87 08/16/2017 1256   GFRNONAA 54 (L) 06/27/2020 1025   GFRAA >60 06/27/2020 1025    No results found for: SPEP, UPEP  Lab Results  Component Value Date   WBC 35.6 (H) 06/27/2020   NEUTROABS 6.1 06/27/2020   HGB 10.7 (L) 06/27/2020   HCT 32.7 (L) 06/27/2020   MCV 101.9 (H) 06/27/2020   PLT 164 06/27/2020      Chemistry      Component Value Date/Time   NA 139 06/27/2020 1025   NA 138 08/16/2017 1256   K 4.6 06/27/2020 1025   K 4.3 08/16/2017 1256   CL 104 06/27/2020 1025   CO2 31 06/27/2020 1025   CO2 25 08/16/2017 1256    BUN 20 06/27/2020 1025   BUN 19.6 08/16/2017 1256   CREATININE 0.92 06/27/2020 1025   CREATININE 0.7 08/16/2017 1256      Component Value Date/Time   CALCIUM 8.7 (L) 06/27/2020 1025   CALCIUM 9.2 08/16/2017 1256   ALKPHOS 73 06/27/2020 1025   ALKPHOS 42 08/16/2017 1256   AST 27 06/27/2020 1025   AST 18 08/16/2017 1256   ALT 11 06/27/2020 1025   ALT 11 08/16/2017 1256   BILITOT 0.4 06/27/2020 1025   BILITOT 0.87 08/16/2017 1256

## 2020-06-27 NOTE — Assessment & Plan Note (Signed)
She has persistent symptoms of severe trigeminal neuralgia She will continue taking medication as prescribed by her primary doctor

## 2020-06-27 NOTE — Assessment & Plan Note (Addendum)
She is very frail She tolerated treatment poorly in the past She has a few signs of disease progression with mild worsening pancytopenia but generally asymptomatic It is not clear how much of her current anemia contribute to her symptoms of excessive fatigue We discussed the risk and benefits of treatment The patient wants "a simple treatment" that could prolong her life with no side effects if possible that she can tolerate better Unfortunately, there is no such thing as a simple treatment with minimum side effects Even with single agent rituximab/immunotherapy, it is not clear whether she can tolerate that well or not Sometimes, complication from treatment can also shorten her life We have a prolonged discussion about goals of care Ultimately, she is undecided about treatment We also discussed potential referral for her to obtain second opinion but the patient declined I will see her again in 3 months with repeat blood work for further follow-up

## 2020-06-27 NOTE — Assessment & Plan Note (Signed)
The disease is incurable She is very frail and is unlikely going to get stronger There is no indication to treat her condition right now; however, given progressive anemia, I suspect she will need treatment within the next 6 months or so We discussed prognosis with or without treatment With her age and her frail status, I am not certain she can tolerate any further treatment She is aware that treatment goal is strictly palliative in nature While treatment can potentially prolong her life, it can be associated with side effects that could impact her quality of life I asked the patient whether it is acceptable not to come back for further treatment, assuming that she is not willing to undergo further treatment Apparently, it is not an acceptable choice for the patient to progress on disease without treatment Her ideal way of dying would be when she is very comfortable, without her disease causing complications and when she is ready to die Unfortunately, I cannot meet her expectation I will see her back in 3 months for further follow-up

## 2020-06-27 NOTE — Telephone Encounter (Signed)
Scheduled appts per 9/27 sch msg. Gave pt a print out of AVS.

## 2020-08-24 ENCOUNTER — Emergency Department (HOSPITAL_COMMUNITY): Payer: Medicare Other

## 2020-08-24 ENCOUNTER — Inpatient Hospital Stay (HOSPITAL_COMMUNITY)
Admission: EM | Admit: 2020-08-24 | Discharge: 2020-08-29 | DRG: 083 | Disposition: A | Discharge status: Skilled Nursing Facility | Payer: Medicare Other | Source: Ambulatory Visit | Attending: Internal Medicine | Admitting: Internal Medicine

## 2020-08-24 ENCOUNTER — Encounter (HOSPITAL_COMMUNITY): Payer: Self-pay

## 2020-08-24 DIAGNOSIS — W010XXA Fall on same level from slipping, tripping and stumbling without subsequent striking against object, initial encounter: Secondary | ICD-10-CM | POA: Diagnosis present

## 2020-08-24 DIAGNOSIS — Z7982 Long term (current) use of aspirin: Secondary | ICD-10-CM

## 2020-08-24 DIAGNOSIS — E861 Hypovolemia: Secondary | ICD-10-CM | POA: Diagnosis present

## 2020-08-24 DIAGNOSIS — Z79899 Other long term (current) drug therapy: Secondary | ICD-10-CM

## 2020-08-24 DIAGNOSIS — L8991 Pressure ulcer of unspecified site, stage 1: Secondary | ICD-10-CM | POA: Diagnosis not present

## 2020-08-24 DIAGNOSIS — Z888 Allergy status to other drugs, medicaments and biological substances status: Secondary | ICD-10-CM

## 2020-08-24 DIAGNOSIS — S066X9A Traumatic subarachnoid hemorrhage with loss of consciousness of unspecified duration, initial encounter: Principal | ICD-10-CM | POA: Diagnosis present

## 2020-08-24 DIAGNOSIS — S0001XA Abrasion of scalp, initial encounter: Secondary | ICD-10-CM | POA: Diagnosis present

## 2020-08-24 DIAGNOSIS — L8990 Pressure ulcer of unspecified site, unspecified stage: Secondary | ICD-10-CM | POA: Insufficient documentation

## 2020-08-24 DIAGNOSIS — R54 Age-related physical debility: Secondary | ICD-10-CM | POA: Diagnosis present

## 2020-08-24 DIAGNOSIS — G500 Trigeminal neuralgia: Secondary | ICD-10-CM | POA: Diagnosis present

## 2020-08-24 DIAGNOSIS — Z87891 Personal history of nicotine dependence: Secondary | ICD-10-CM

## 2020-08-24 DIAGNOSIS — E871 Hypo-osmolality and hyponatremia: Secondary | ICD-10-CM | POA: Diagnosis present

## 2020-08-24 DIAGNOSIS — Z880 Allergy status to penicillin: Secondary | ICD-10-CM

## 2020-08-24 DIAGNOSIS — I609 Nontraumatic subarachnoid hemorrhage, unspecified: Secondary | ICD-10-CM

## 2020-08-24 DIAGNOSIS — Z96611 Presence of right artificial shoulder joint: Secondary | ICD-10-CM | POA: Diagnosis present

## 2020-08-24 DIAGNOSIS — Z823 Family history of stroke: Secondary | ICD-10-CM

## 2020-08-24 DIAGNOSIS — M810 Age-related osteoporosis without current pathological fracture: Secondary | ICD-10-CM | POA: Diagnosis present

## 2020-08-24 DIAGNOSIS — E039 Hypothyroidism, unspecified: Secondary | ICD-10-CM | POA: Diagnosis present

## 2020-08-24 DIAGNOSIS — C8588 Other specified types of non-Hodgkin lymphoma, lymph nodes of multiple sites: Secondary | ICD-10-CM | POA: Diagnosis present

## 2020-08-24 DIAGNOSIS — C9110 Chronic lymphocytic leukemia of B-cell type not having achieved remission: Secondary | ICD-10-CM | POA: Diagnosis present

## 2020-08-24 DIAGNOSIS — D539 Nutritional anemia, unspecified: Secondary | ICD-10-CM

## 2020-08-24 DIAGNOSIS — S0121XA Laceration without foreign body of nose, initial encounter: Secondary | ICD-10-CM | POA: Diagnosis present

## 2020-08-24 DIAGNOSIS — W19XXXA Unspecified fall, initial encounter: Secondary | ICD-10-CM

## 2020-08-24 DIAGNOSIS — I10 Essential (primary) hypertension: Secondary | ICD-10-CM | POA: Diagnosis present

## 2020-08-24 DIAGNOSIS — Z7983 Long term (current) use of bisphosphonates: Secondary | ICD-10-CM

## 2020-08-24 DIAGNOSIS — R29810 Facial weakness: Secondary | ICD-10-CM | POA: Diagnosis present

## 2020-08-24 DIAGNOSIS — D630 Anemia in neoplastic disease: Secondary | ICD-10-CM | POA: Diagnosis present

## 2020-08-24 DIAGNOSIS — R0902 Hypoxemia: Secondary | ICD-10-CM

## 2020-08-24 DIAGNOSIS — Z20822 Contact with and (suspected) exposure to covid-19: Secondary | ICD-10-CM | POA: Diagnosis present

## 2020-08-24 DIAGNOSIS — G8194 Hemiplegia, unspecified affecting left nondominant side: Secondary | ICD-10-CM | POA: Diagnosis present

## 2020-08-24 LAB — CBC WITH DIFFERENTIAL/PLATELET
Abs Immature Granulocytes: 0 10*3/uL (ref 0.00–0.07)
Band Neutrophils: 1 %
Basophils Absolute: 0 10*3/uL (ref 0.0–0.1)
Basophils Relative: 0 %
Eosinophils Absolute: 0 10*3/uL (ref 0.0–0.5)
Eosinophils Relative: 0 %
HCT: 35.1 % — ABNORMAL LOW (ref 36.0–46.0)
Hemoglobin: 11.3 g/dL — ABNORMAL LOW (ref 12.0–15.0)
Lymphocytes Relative: 67 %
Lymphs Abs: 26.8 10*3/uL — ABNORMAL HIGH (ref 0.7–4.0)
MCH: 34.1 pg — ABNORMAL HIGH (ref 26.0–34.0)
MCHC: 32.2 g/dL (ref 30.0–36.0)
MCV: 106 fL — ABNORMAL HIGH (ref 80.0–100.0)
Monocytes Absolute: 4.4 10*3/uL — ABNORMAL HIGH (ref 0.1–1.0)
Monocytes Relative: 11 %
Neutro Abs: 8.8 10*3/uL — ABNORMAL HIGH (ref 1.7–7.7)
Neutrophils Relative %: 21 %
Platelets: 168 10*3/uL (ref 150–400)
RBC: 3.31 MIL/uL — ABNORMAL LOW (ref 3.87–5.11)
RDW: 15.7 % — ABNORMAL HIGH (ref 11.5–15.5)
WBC: 40 10*3/uL — ABNORMAL HIGH (ref 4.0–10.5)
nRBC: 0 % (ref 0.0–0.2)

## 2020-08-24 LAB — RESP PANEL BY RT-PCR (FLU A&B, COVID) ARPGX2
Influenza A by PCR: NEGATIVE
Influenza B by PCR: NEGATIVE
SARS Coronavirus 2 by RT PCR: NEGATIVE

## 2020-08-24 LAB — COMPREHENSIVE METABOLIC PANEL
ALT: 12 U/L (ref 0–44)
AST: 31 U/L (ref 15–41)
Albumin: 1.9 g/dL — ABNORMAL LOW (ref 3.5–5.0)
Alkaline Phosphatase: 62 U/L (ref 38–126)
Anion gap: 9 (ref 5–15)
BUN: 20 mg/dL (ref 8–23)
CO2: 24 mmol/L (ref 22–32)
Calcium: 8.6 mg/dL — ABNORMAL LOW (ref 8.9–10.3)
Chloride: 105 mmol/L (ref 98–111)
Creatinine, Ser: 0.76 mg/dL (ref 0.44–1.00)
GFR, Estimated: >60 mL/min (ref >60)
Glucose, Bld: 93 mg/dL (ref 70–99)
Potassium: 4.8 mmol/L (ref 3.5–5.1)
Sodium: 138 mmol/L (ref 135–145)
Total Bilirubin: 0.5 mg/dL (ref 0.3–1.2)
Total Protein: 7.1 g/dL (ref 6.5–8.1)

## 2020-08-24 MED ORDER — GABAPENTIN 400 MG PO CAPS
800.0000 mg | ORAL_CAPSULE | Freq: Three times a day (TID) | ORAL | Status: DC
  Administered 2020-08-24 – 2020-08-29 (×14): 800 mg via ORAL
  Filled 2020-08-24 (×9): qty 2
  Filled 2020-08-24: qty 8
  Filled 2020-08-24 (×4): qty 2

## 2020-08-24 MED ORDER — ACETAMINOPHEN 325 MG PO TABS
650.0000 mg | ORAL_TABLET | Freq: Four times a day (QID) | ORAL | Status: DC | PRN
  Administered 2020-08-28 – 2020-08-29 (×2): 650 mg via ORAL
  Filled 2020-08-24 (×2): qty 2

## 2020-08-24 MED ORDER — LEVETIRACETAM 500 MG PO TABS
500.0000 mg | ORAL_TABLET | Freq: Once | ORAL | Status: AC
  Administered 2020-08-24: 500 mg via ORAL
  Filled 2020-08-24: qty 1

## 2020-08-24 MED ORDER — HYDRALAZINE HCL 20 MG/ML IJ SOLN: 5.0000 mg | INTRAMUSCULAR | Status: DC | PRN

## 2020-08-24 MED ORDER — ACETAMINOPHEN 650 MG RE SUPP: 650.0000 mg | Freq: Four times a day (QID) | RECTAL | Status: DC | PRN

## 2020-08-24 MED ORDER — BIOTIN 10 MG PO CAPS: 10.0000 mg | ORAL_CAPSULE | Freq: Every day | ORAL | Status: DC

## 2020-08-24 MED ORDER — ADULT MULTIVITAMIN W/MINERALS CH
1.0000 | ORAL_TABLET | Freq: Every day | ORAL | Status: DC
  Administered 2020-08-25 – 2020-08-29 (×5): 1 via ORAL
  Filled 2020-08-24 (×5): qty 1

## 2020-08-24 MED ORDER — PROPRANOLOL HCL 20 MG PO TABS
10.0000 mg | ORAL_TABLET | Freq: Every day | ORAL | Status: DC
  Administered 2020-08-25 – 2020-08-29 (×5): 10 mg via ORAL
  Filled 2020-08-24 (×5): qty 1

## 2020-08-24 MED ORDER — CALCIUM CARBONATE-VITAMIN D 500-200 MG-UNIT PO TABS
1.0000 | ORAL_TABLET | Freq: Every day | ORAL | Status: DC
  Administered 2020-08-25 – 2020-08-29 (×5): 1 via ORAL
  Filled 2020-08-24 (×5): qty 1

## 2020-08-24 MED ORDER — LEVETIRACETAM 500 MG PO TABS
500.0000 mg | ORAL_TABLET | Freq: Two times a day (BID) | ORAL | Status: DC
  Administered 2020-08-25 – 2020-08-29 (×9): 500 mg via ORAL
  Filled 2020-08-24 (×9): qty 1

## 2020-08-24 MED ORDER — LEVOTHYROXINE SODIUM 75 MCG PO TABS
75.0000 ug | ORAL_TABLET | Freq: Every day | ORAL | Status: DC
  Administered 2020-08-25 – 2020-08-29 (×5): 75 ug via ORAL
  Filled 2020-08-24 (×5): qty 1

## 2020-08-24 NOTE — H&P (Signed)
History and Physical    Raguel Kosloski VEH:[id number] DOB: 18-Sep-1926 DOA: 08/24/2020  PCP: Cari Caraway, MD Patient coming from: [place] independent living facility  Chief Complaint: Fall  HPI: Joy Lynch is a 86 y.o. female with medical history significant of marginal zone lymphoma followed by oncology, migraines, osteoporosis, hypertension, hypothyroidism, trigeminal neuralgia presenting to the ED via EMS from her independent nursing facility after a witnessed fall which happened just prior to arrival.  Patient states she was walking to the dining room at her facility and somehow tripped and fell on her face.  She denies any preceding dizziness, chest pain, or shortness of breath.  She believes she might have lost consciousness after she fell on the ground and hit her face.  Her left lower ribs are sore since the fall but denies any other injuries.  Denies headaches, neck pain, back pain, or pain anywhere else.  She takes aspirin 81 mg daily.  Denies history of seizures.  ED Course: Hemodynamically stable.  WBC 40.0, hemoglobin 11.3, hematocrit 35.1, platelet 168K.  Sodium 138, potassium 4.8, chloride 105, bicarb 24, BUN 20, creatinine 0.7, glucose 93.  LFTs normal.  Screening SARS-CoV-2 PCR test and influenza panel pending.  X-ray of left chest/ribs showing no acute rib fracture.  Head CT showing right frontoparietal subarachnoid hemorrhage.  Associated underlying intraparenchymal hemorrhage not excluded.  Repeat CT scan recommended in 6 hours.  CT maxillofacial showing left maxillary subcutaneous soft tissue hematoma formation but no acute facial fracture.  CT C-spine negative for acute finding.  ED provider discussed the case with on-call neurosurgeon Dr. Marcello Moores who requested admission under medicine service at [facility].  Recommended repeat CT scan in the morning.  Also recommended starting the patient on Keppra 500 mg twice daily x7 days.  Recommended keeping blood pressure well  controlled with MAP ranging between 70-90.  Patient was given p.o. Keppra 500 mg in the ED.  Review of Systems:  All systems reviewed and apart from history of presenting illness, are negative.  Past Medical History:  Diagnosis Date  . Cancer [facility])    non hodgkin lymphoma  . Chronic lymphocytic leukemia ([place])   . History of B-cell lymphoma 11/02/2015  . Migraines   . Osteoporosis   . Thyroid disease   . Trigeminal neuralgia of left side of face     Past Surgical History:  Procedure Laterality Date  . gamma knife     for trigeminal neuralgia  . SHOULDER SURGERY     Right shoulder replacement  . TONSILLECTOMY AND ADENOIDECTOMY    . WISDOM TOOTH EXTRACTION       reports that she quit smoking about 44 years ago. She has a 10.00 pack-year smoking history. She has never used smokeless tobacco. She reports current alcohol use of about 2.0 standard drinks of alcohol per week. She reports that she does not use drugs.  Allergies  Allergen Reactions  . Penicillins Hives  . Carbamazepine Other (See Comments) and Hives    Turned purple from neck down and drowsy Other reaction(s): Other (See Comments) Turned purple from neck down and drowsy  . Pregabalin Other (See Comments)    Drowsy Other reaction(s): Confusion (intolerance) Drowsy    Family History  Problem Relation Age of Onset  . Cancer Father        unknown ca  . Pneumonia Father   . Cancer Brother        prostate ca  . Stroke Brother   . Heart attack Mother  Prior to Admission medications   Medication Sig Start Date End Date Taking? Authorizing Provider  alendronate (FOSAMAX) 70 MG tablet Take 70 mg by mouth once a week. 09/19/15   [provider]  aspirin 81 MG tablet Take 81 mg by mouth at bedtime.     [provider]  Biotin 10 MG CAPS Take 10 mg by mouth daily.    [provider]  calcium-vitamin D (OSCAL WITH D) 500-200 MG-UNIT tablet Take 1 tablet by mouth daily.     [provider]  gabapentin (NEURONTIN) 400 MG capsule Take 800 mg by mouth 3 (three) times daily. 08/05/17   [provider]  ipratropium (ATROVENT) 0.06 % nasal spray Place 2 sprays into the nose as needed. 12/23/17   [provider]  levothyroxine (SYNTHROID, LEVOTHROID) 88 MCG tablet Take 88 mcg by mouth daily. Six days per week 08/29/15   [provider]  Meclizine HCl 25 MG CHEW Chew 25 mg by mouth as needed.    [provider]  Multiple Vitamin (MULTI-VITAMINS) TABS Take 1 tablet by mouth daily.    [provider]  propranolol (INDERAL) 10 MG tablet Take 10 mg by mouth daily.    [provider]    Physical Exam: Vitals:   08/24/20 2000 08/24/20 2015 08/24/20 2030 08/24/20 2045  BP:    136/76  Pulse: 90 94 93 94  Resp: 18 20 20 18   Temp:      TempSrc:      SpO2: 94% 94% 93% 94%    Physical Exam Constitutional:      General: She is not in acute distress. HENT:     Head: Normocephalic and atraumatic.  Eyes:     Extraocular Movements: Extraocular movements intact.     Pupils: Pupils are equal, round, and reactive to light.  Cardiovascular:     Rate and Rhythm: Normal rate and regular rhythm.     Pulses: Normal pulses.  Pulmonary:     Effort: Pulmonary effort is normal. No respiratory distress.     Breath sounds: Normal breath sounds. No wheezing or rales.  Abdominal:     General: Bowel sounds are normal. There is no distension.     Palpations: Abdomen is soft.     Tenderness: There is no abdominal tenderness.  Musculoskeletal:     Cervical back: Normal range of motion and neck supple.     Right lower leg: Edema present.     Left lower leg: Edema present.     Comments: +1 pedal edema  Skin:    General: Skin is warm and dry.  Neurological:     General: No focal deficit present.     Mental Status: She is alert and oriented to person, place, and time.     Cranial Nerves: No cranial nerve deficit.     Sensory: No  sensory deficit.     Motor: No weakness.     Labs on Admission: I have personally reviewed following labs and imaging studies  CBC: Recent Labs  Lab 08/24/20 2006  WBC 40.0*  NEUTROABS PENDING  HGB 11.3*  HCT 35.1*  MCV 106.0*  PLT 419   Basic Metabolic Panel: Recent Labs  Lab 08/24/20 2006  NA 138  K 4.8  CL 105  CO2 24  GLUCOSE 93  BUN 20  CREATININE 0.76  CALCIUM 8.6*   GFR: CrCl cannot be calculated (Unknown ideal weight.). Liver Function Tests: Recent Labs  Lab 08/24/20 2006  AST  31  ALT 12  ALKPHOS 62  BILITOT 0.5  PROT 7.1  ALBUMIN 1.9*   No results for input(s): LIPASE, AMYLASE in the last 168 hours. No results for input(s): AMMONIA in the last 168 hours. Coagulation Profile: No results for input(s): INR, PROTIME in the last 168 hours. Cardiac Enzymes: No results for input(s): CKTOTAL, CKMB, CKMBINDEX, TROPONINI in the last 168 hours. BNP (last 3 results) No results for input(s): PROBNP in the last 8760 hours. HbA1C: No results for input(s): HGBA1C in the last 72 hours. CBG: No results for input(s): GLUCAP in the last 168 hours. Lipid Profile: No results for input(s): CHOL, HDL, LDLCALC, TRIG, CHOLHDL, LDLDIRECT in the last 72 hours. Thyroid Function Tests: No results for input(s): TSH, T4TOTAL, FREET4, T3FREE, THYROIDAB in the last 72 hours. Anemia Panel: No results for input(s): VITAMINB12, FOLATE, FERRITIN, TIBC, IRON, RETICCTPCT in the last 72 hours. Urine analysis:    Component Value Date/Time   COLORURINE AMBER (A) 02/18/2017 1318   APPEARANCEUR CLOUDY (A) 02/18/2017 1318   LABSPEC 1.018 02/18/2017 1318   PHURINE 5.0 02/18/2017 1318   GLUCOSEU NEGATIVE 02/18/2017 1318   HGBUR MODERATE (A) 02/18/2017 1318   BILIRUBINUR NEGATIVE 02/18/2017 1318   KETONESUR 5 (A) 02/18/2017 1318   PROTEINUR 30 (A) 02/18/2017 1318   NITRITE NEGATIVE 02/18/2017 1318   LEUKOCYTESUR TRACE (A) 02/18/2017 1318    Radiological Exams on Admission: DG  Ribs Unilateral W/Chest Left  Result Date: 08/24/2020 CLINICAL DATA:  Status post fall. EXAM: LEFT RIBS AND CHEST - 3+ VIEW COMPARISON:  December 28, 2017 FINDINGS: A radiopaque marker was placed at the site of the patient's pain. No fracture or other bone lesions are seen involving the ribs. An intact right shoulder replacement is noted. Multilevel degenerative changes seen throughout the thoracic spine. There is no evidence of pneumothorax or pleural effusion. Both lungs are clear. Heart size and mediastinal contours are within normal limits. IMPRESSION: 1. No acute rib fracture. Electronically Signed   By: Virgina Norfolk M.D.   On: 08/24/2020 19:51   CT Head Wo Contrast  Result Date: 08/24/2020 CLINICAL DATA:  Status post fall 1 getting female. Possible loss of consciousness. Facial swelling left cheek. Abrasion. EXAM: CT HEAD WITHOUT CONTRAST CT MAXILLOFACIAL WITHOUT CONTRAST CT CERVICAL SPINE WITHOUT CONTRAST TECHNIQUE: Multidetector CT imaging of the head, cervical spine, and maxillofacial structures were performed using the standard protocol without intravenous contrast. Multiplanar CT image reconstructions of the cervical spine and maxillofacial structures were also generated. COMPARISON:  MRA head 12/12/2017 FINDINGS: CT HEAD FINDINGS Brain: Cerebral ventricle sizes are concordant with the degree of cerebral volume loss. Loss Patchy and confluent areas of decreased attenuation are noted throughout the deep and periventricular white matter of the cerebral hemispheres bilaterally, compatible with chronic microvascular ischemic disease. No evidence of large-territorial acute infarction. No definite parenchymal hemorrhage. No mass lesion. Small moderate volume right frontoparietal sub arachnoid hemorrhage. No mass effect or midline shift. No hydrocephalus. No intraventricular extension of the hemorrhage. Basilar cisterns are patent. Vascular: No hyperdense vessel. Skull: No acute fracture or focal lesion.  Other: None. CT MAXILLOFACIAL FINDINGS Osseous: No acute displaced fracture. Sinuses/Orbits: Paranasal sinuses and mastoid air cells are clear. Bilateral lens replacement. Otherwise the orbits are unremarkable. Soft tissues: Left maxillary subcutaneus soft tissue edema with associated 2 x 1 cm sub cutaneous hematoma formation. CT CERVICAL SPINE FINDINGS Alignment: Straightening of the normal cervical lordosis likely due to positioning. Grade 1 anterolisthesis of L3 on L4 and L4 on L5.  Skull base and vertebrae: Severe multilevel degenerative changes of the cervical spine with multilevel intervertebral disc space narrowing which is most prominent at the C6-C7 levels. No acute fracture. No aggressive appearing focal osseous lesion or focal pathologic process. Soft tissues and spinal canal: No prevertebral fluid or swelling. No visible canal hematoma. Upper chest: Biapical pleural/pulmonary scarring. Other: None. IMPRESSION: 1. Right frontoparietal subarachnoid hemorrhage. Associated underlying intraparenchymal hemorrhage not excluded. Recommend repeat CT head in 6 hours. 2. No acute facial fracture in a patient with left maxillary subcutaneus soft tissue hematoma formation. 3. No acute displaced fracture or traumatic listhesis of the cervical spine in a patient with severe multilevel degenerative changes. These results were called by telephone at the time of interpretation on 08/24/2020 at 7:46 pm to provider Dr. Alvino Chapel, who verbally acknowledged these results. Electronically Signed   By: Iven Finn M.D.   On: 08/24/2020 19:53   CT Cervical Spine Wo Contrast  Result Date: 08/24/2020 CLINICAL DATA:  Status post fall 1 getting female. Possible loss of consciousness. Facial swelling left cheek. Abrasion. EXAM: CT HEAD WITHOUT CONTRAST CT MAXILLOFACIAL WITHOUT CONTRAST CT CERVICAL SPINE WITHOUT CONTRAST TECHNIQUE: Multidetector CT imaging of the head, cervical spine, and maxillofacial structures were performed  using the standard protocol without intravenous contrast. Multiplanar CT image reconstructions of the cervical spine and maxillofacial structures were also generated. COMPARISON:  MRA head 12/12/2017 FINDINGS: CT HEAD FINDINGS Brain: Cerebral ventricle sizes are concordant with the degree of cerebral volume loss. Loss Patchy and confluent areas of decreased attenuation are noted throughout the deep and periventricular white matter of the cerebral hemispheres bilaterally, compatible with chronic microvascular ischemic disease. No evidence of large-territorial acute infarction. No definite parenchymal hemorrhage. No mass lesion. Small moderate volume right frontoparietal sub arachnoid hemorrhage. No mass effect or midline shift. No hydrocephalus. No intraventricular extension of the hemorrhage. Basilar cisterns are patent. Vascular: No hyperdense vessel. Skull: No acute fracture or focal lesion. Other: None. CT MAXILLOFACIAL FINDINGS Osseous: No acute displaced fracture. Sinuses/Orbits: Paranasal sinuses and mastoid air cells are clear. Bilateral lens replacement. Otherwise the orbits are unremarkable. Soft tissues: Left maxillary subcutaneus soft tissue edema with associated 2 x 1 cm sub cutaneous hematoma formation. CT CERVICAL SPINE FINDINGS Alignment: Straightening of the normal cervical lordosis likely due to positioning. Grade 1 anterolisthesis of L3 on L4 and L4 on L5. Skull base and vertebrae: Severe multilevel degenerative changes of the cervical spine with multilevel intervertebral disc space narrowing which is most prominent at the C6-C7 levels. No acute fracture. No aggressive appearing focal osseous lesion or focal pathologic process. Soft tissues and spinal canal: No prevertebral fluid or swelling. No visible canal hematoma. Upper chest: Biapical pleural/pulmonary scarring. Other: None. IMPRESSION: 1. Right frontoparietal subarachnoid hemorrhage. Associated underlying intraparenchymal hemorrhage not  excluded. Recommend repeat CT head in 6 hours. 2. No acute facial fracture in a patient with left maxillary subcutaneus soft tissue hematoma formation. 3. No acute displaced fracture or traumatic listhesis of the cervical spine in a patient with severe multilevel degenerative changes. These results were called by telephone at the time of interpretation on 08/24/2020 at 7:46 pm to provider Dr. Alvino Chapel, who verbally acknowledged these results. Electronically Signed   By: Iven Finn M.D.   On: 08/24/2020 19:53   CT Maxillofacial Wo Contrast  Result Date: 08/24/2020 CLINICAL DATA:  Status post fall 1 getting female. Possible loss of consciousness. Facial swelling left cheek. Abrasion. EXAM: CT HEAD WITHOUT CONTRAST CT MAXILLOFACIAL WITHOUT CONTRAST CT CERVICAL SPINE WITHOUT  CONTRAST TECHNIQUE: Multidetector CT imaging of the head, cervical spine, and maxillofacial structures were performed using the standard protocol without intravenous contrast. Multiplanar CT image reconstructions of the cervical spine and maxillofacial structures were also generated. COMPARISON:  MRA head 12/12/2017 FINDINGS: CT HEAD FINDINGS Brain: Cerebral ventricle sizes are concordant with the degree of cerebral volume loss. Loss Patchy and confluent areas of decreased attenuation are noted throughout the deep and periventricular white matter of the cerebral hemispheres bilaterally, compatible with chronic microvascular ischemic disease. No evidence of large-territorial acute infarction. No definite parenchymal hemorrhage. No mass lesion. Small moderate volume right frontoparietal sub arachnoid hemorrhage. No mass effect or midline shift. No hydrocephalus. No intraventricular extension of the hemorrhage. Basilar cisterns are patent. Vascular: No hyperdense vessel. Skull: No acute fracture or focal lesion. Other: None. CT MAXILLOFACIAL FINDINGS Osseous: No acute displaced fracture. Sinuses/Orbits: Paranasal sinuses and mastoid air cells  are clear. Bilateral lens replacement. Otherwise the orbits are unremarkable. Soft tissues: Left maxillary subcutaneus soft tissue edema with associated 2 x 1 cm sub cutaneous hematoma formation. CT CERVICAL SPINE FINDINGS Alignment: Straightening of the normal cervical lordosis likely due to positioning. Grade 1 anterolisthesis of L3 on L4 and L4 on L5. Skull base and vertebrae: Severe multilevel degenerative changes of the cervical spine with multilevel intervertebral disc space narrowing which is most prominent at the C6-C7 levels. No acute fracture. No aggressive appearing focal osseous lesion or focal pathologic process. Soft tissues and spinal canal: No prevertebral fluid or swelling. No visible canal hematoma. Upper chest: Biapical pleural/pulmonary scarring. Other: None. IMPRESSION: 1. Right frontoparietal subarachnoid hemorrhage. Associated underlying intraparenchymal hemorrhage not excluded. Recommend repeat CT head in 6 hours. 2. No acute facial fracture in a patient with left maxillary subcutaneus soft tissue hematoma formation. 3. No acute displaced fracture or traumatic listhesis of the cervical spine in a patient with severe multilevel degenerative changes. These results were called by telephone at the time of interpretation on 08/24/2020 at 7:46 pm to provider Dr. Alvino Chapel, who verbally acknowledged these results. Electronically Signed   By: Iven Finn M.D.   On: 08/24/2020 19:53    EKG: Independently reviewed.  Sinus rhythm, no significant change since prior tracing.  Assessment/Plan Principal Problem:   Subarachnoid hemorrhage (HCC) Active Problems:   Trigeminal neuralgia   Lymphoma, marginal zone, lymph nodes of multiple sites [facility])   Fall   Macrocytic anemia   Subarachnoid hemorrhage/ possible intraparenchymal hemorrhage: Patient had a witnessed fall prior to ED arrival at her independent living facility.  She takes aspirin 81 mg daily. Head CT showing right frontoparietal  subarachnoid hemorrhage.  Associated underlying intraparenchymal hemorrhage not excluded.  ED provider discussed the case with on-call neurosurgeon Dr. Marcello Moores who requested admission under medicine service at [facility].  Recommended repeat CT scan in the morning.  Also recommended starting the patient on Keppra 500 mg twice daily x7 days.  Recommended keeping blood pressure well controlled with MAP ranging between 70-90. -Admit to progressive care unit at [facility].  Neuro exam currently nonfocal, continue frequent neurochecks.  Repeat head CT scan in 6 hours.  Hold aspirin.  Keppra 500 mg twice daily for 7 days.  Monitor blood pressure closely with goal MAP ranging between 70-90.  Fall: Appears to be mechanical. -Fall precautions, PT/OT eval  Marginal zone lymphoma, lymph nodes of multiple sites Followed by Dr. Marden Noble from oncology and currently not on treatment due to advanced age and frail status.  Labs done today showing WBC 40.0, differential pending.  WBC count elevated at 35.6 on labs done on 06/27/2020 during oncology office visit. -Please ensure outpatient oncology follow-up.  Macrocytic anemia: Hemoglobin 11.3, hematocrit 35.1, MCV 106.0.  Hemoglobin appears to be at baseline.  Patient is followed by heme-onc. -Please ensure outpatient heme-onc follow-up  Hypertension -Continue home propranolol.  IV hydralazine PRN SBP >160. Monitor blood pressure closely with goal MAP ranging between 70-90.  Trigeminal neuralgia -Continue home gabapentin  Hypothyroidism -Continue home Synthroid  DVT prophylaxis: SCDs Code Status: Patient wishes to be full code. Family Communication: Nephew at bedside. Disposition Plan: Status is: Observation  The patient remains OBS appropriate and will d/c before 2 midnights.  Dispo: The patient is from: Independent living facility              Anticipated d/c is to: SNF              Anticipated d/c date is: 1 day              Patient currently is not  medically stable to d/c.  The medical decision making on this patient was of high complexity and the patient is at high risk for clinical deterioration, therefore this is a level 3 visit.  Shela Leff MD [facility]  If 7PM-7AM, please contact night-coverage [email or web address]  08/24/2020, 9:58 PM

## 2020-08-24 NOTE — ED Provider Notes (Addendum)
[facility] DEPT Provider Note   CSN: [id number] Arrival date & time: 08/24/20  1746     History Chief Complaint  Patient presents with  . Fall    Joy Lynch is a 86 y.o. female with past medical history significant for non-Hodgkin lymphoma, chronic lymphocytic leukemia, trigeminal neuralgia of left side of face.  Not anticoagulated, daily baby aspirin only.  Tdap is up-to-date  HPI Patient presents today to emergency department with chief complaint of fall happening just prior to arrival.  Patient states she remembers walking down to dinner and the next thing she knew EMS personnel was standing over her. She fell forward landing on her face. Thinks she tripped and had LOC. The fall was witnessed by staff at [facility]. Patient is resident in independent living. She had no seizure like activity. She states today has been an overall unremarkable day until the fall. She denies any recent illness or current URI symptoms. She did not have any chest pain or SOB before the fall. She is endorsing mild aching pain in her left ribs and has a laceration on bridge of her nose. She rates pain 4/10 in severity. No shortness of breath or difficulty breathing. No medications given for symptoms prior to arrival. Denies fever, headache, visual changes, neck pain, cough, abdominal pain, back pain, urinary symptoms, numbness, weakness, or tingling.     Past Medical History:  Diagnosis Date  . Cancer [facility])    non hodgkin lymphoma  . Chronic lymphocytic leukemia ([place])   . History of B-cell lymphoma 11/02/2015  . Migraines   . Osteoporosis   . Thyroid disease   . Trigeminal neuralgia of left side of face     Patient Active Problem List   Diagnosis Date Noted  . Subarachnoid hemorrhage ([place]) 08/24/2020  . Bilateral leg edema 03/07/2020  . Right shoulder pain 05/24/2017  . Pancytopenia, acquired ([place]) 02/12/2017  . Other constipation 02/12/2017  . Goals of care,  counseling/discussion 01/24/2017  . Lymphoma, marginal zone, lymph nodes of multiple sites ([place]) 12/24/2016  . Anemia in neoplastic disease 12/24/2016  . Trigeminal neuralgia 08/13/2016  . Elevated total protein 06/26/2016  . History of B-cell lymphoma 11/02/2015    Past Surgical History:  Procedure Laterality Date  . gamma knife     for trigeminal neuralgia  . SHOULDER SURGERY     Right shoulder replacement  . TONSILLECTOMY AND ADENOIDECTOMY    . WISDOM TOOTH EXTRACTION       OB History   No obstetric history on file.     Family History  Problem Relation Age of Onset  . Cancer Father        unknown ca  . Pneumonia Father   . Cancer Brother        prostate ca  . Stroke Brother   . Heart attack Mother     Social History   Tobacco Use  . Smoking status: Former Smoker    Packs/day: 0.50    Years: 20.00    Pack years: 10.00    Quit date: 10/02/1975    Years since quitting: 44.9  . Smokeless tobacco: Never Used  Substance Use Topics  . Alcohol use: Yes    Alcohol/week: 2.0 standard drinks    Types: 2 Glasses of wine per week  . Drug use: No    Home Medications Prior to Admission medications   Medication Sig Start Date End Date Taking? Authorizing Provider  gabapentin (NEURONTIN) 400 MG capsule Take 800  mg by mouth 3 (three) times daily. 08/05/17  Yes [provider]  alendronate (FOSAMAX) 70 MG tablet Take 70 mg by mouth once a week. 09/19/15   [provider]  aspirin 81 MG tablet Take 81 mg by mouth at bedtime.     [provider]  Biotin 10 MG CAPS Take 10 mg by mouth daily.    [provider]  calcium-vitamin D (OSCAL WITH D) 500-200 MG-UNIT tablet Take 1 tablet by mouth daily.     [provider]  ipratropium (ATROVENT) 0.06 % nasal spray Place 2 sprays into the nose as needed. 12/23/17   [provider]  Meclizine HCl 25 MG CHEW Chew 25 mg by mouth as needed.    [provider]  Multiple Vitamin  (MULTI-VITAMINS) TABS Take 1 tablet by mouth daily.    [provider]  propranolol (INDERAL) 10 MG tablet Take 10 mg by mouth daily.    [provider]    Allergies    Penicillins, Tegretol [carbamazepine], and Lyrica [pregabalin]  Review of Systems   Review of Systems All other systems are reviewed and are negative for acute change except as noted in the HPI.  Physical Exam Updated Vital Signs BP (!) 142/73 (BP Location: Left Arm)   Pulse 86   Temp 97.6 F (36.4 C) (Oral)   Resp 18   SpO2 93%   Physical Exam Vitals and nursing note reviewed.  Constitutional:      General: She is not in acute distress.    Appearance: She is not ill-appearing.  HENT:     Head: Normocephalic. No raccoon eyes or Battle's sign.     Jaw: There is normal jaw occlusion.      Comments: No tenderness to palpation of skull. No deformities or crepitus noted. No open wounds, abrasions or lacerations.  No pain with EOMs    Right Ear: Tympanic membrane and external ear normal. No hemotympanum.     Left Ear: Tympanic membrane and external ear normal. No hemotympanum.     Nose: Laceration (Superficial abrasion to bridge of nose, no active bleeding.) present. No nasal deformity or nasal tenderness.     Right Nostril: No epistaxis or septal hematoma.     Left Nostril: No epistaxis or septal hematoma.     Mouth/Throat:     Mouth: Mucous membranes are moist.     Pharynx: Oropharynx is clear.  Eyes:     General: No scleral icterus.       Right eye: No discharge.        Left eye: No discharge.     Extraocular Movements: Extraocular movements intact.     Conjunctiva/sclera: Conjunctivae normal.     Pupils: Pupils are equal, round, and reactive to light.  Neck:     Vascular: No JVD.     Comments: Full ROM intact without spinous process TTP. No bony stepoffs or deformities, no paraspinous muscle TTP or muscle spasms. No rigidity or meningeal signs. No bruising, erythema, or  swelling.  Cardiovascular:     Rate and Rhythm: Normal rate and regular rhythm.     Pulses: Normal pulses.          Radial pulses are 2+ on the right side and 2+ on the left side.     Heart sounds: Normal heart sounds.  Pulmonary:     Comments: Lungs clear to auscultation in all fields. Symmetric chest rise. No wheezing, rales, or rhonchi. Chest:  Comments: Tenderness to palpation as depicted in image above.  No crepitus, deformity or evidence of flail chest. Abdominal:     Comments: Abdomen is soft, non-distended, and non-tender in all quadrants. No rigidity, no guarding. No peritoneal signs.  Musculoskeletal:        General: Normal range of motion.     Cervical back: Normal range of motion.  Skin:    General: Skin is warm and dry.     Capillary Refill: Capillary refill takes less than 2 seconds.  Neurological:     Mental Status: She is alert and oriented to person, place, and time.     GCS: GCS eye subscore is 4. GCS verbal subscore is 5. GCS motor subscore is 6.     Comments: Fluent speech, no facial droop, follows commands.  Normal strength in upper and lower extremities bilaterally including dorsiflexion and plantar flexion, strong and equal grip strength   Psychiatric:        Behavior: Behavior normal. Behavior is cooperative.     ED Results / Procedures / Treatments   Labs (all labs ordered are listed, but only abnormal results are displayed) Labs Reviewed  COMPREHENSIVE METABOLIC PANEL - Abnormal; Notable for the following components:      Result Value   Calcium 8.6 (*)    Albumin 1.9 (*)    All other components within normal limits  CBC WITH DIFFERENTIAL/PLATELET - Abnormal; Notable for the following components:   WBC 40.0 (*)    RBC 3.31 (*)    Hemoglobin 11.3 (*)    HCT 35.1 (*)    MCV 106.0 (*)    MCH 34.1 (*)    RDW 15.7 (*)    All other components within normal limits  RESP PANEL BY RT-PCR (FLU A&B, COVID) [id number]    EKG EKG  Interpretation  Date/Time:  Wednesday August 24 2020 18:10:29 EST Ventricular Rate:  87 PR Interval:    QRS Duration: 82 QT Interval:  376 QTC Calculation: 453 R Axis:   22 Text Interpretation: Sinus rhythm Borderline low voltage, extremity leads Abnormal R-wave progression, early transition Confirmed by Davonna Belling [phone]) on 08/24/2020 9:29:04 PM   Radiology DG Ribs Unilateral W/Chest Left  Result Date: 08/24/2020 CLINICAL DATA:  Status post fall. EXAM: LEFT RIBS AND CHEST - 3+ VIEW COMPARISON:  December 28, 2017 FINDINGS: A radiopaque marker was placed at the site of the patient's pain. No fracture or other bone lesions are seen involving the ribs. An intact right shoulder replacement is noted. Multilevel degenerative changes seen throughout the thoracic spine. There is no evidence of pneumothorax or pleural effusion. Both lungs are clear. Heart size and mediastinal contours are within normal limits. IMPRESSION: 1. No acute rib fracture. Electronically Signed   By: Virgina Norfolk M.D.   On: 08/24/2020 19:51   CT Head Wo Contrast  Result Date: 08/24/2020 CLINICAL DATA:  Status post fall 1 getting female. Possible loss of consciousness. Facial swelling left cheek. Abrasion. EXAM: CT HEAD WITHOUT CONTRAST CT MAXILLOFACIAL WITHOUT CONTRAST CT CERVICAL SPINE WITHOUT CONTRAST TECHNIQUE: Multidetector CT imaging of the head, cervical spine, and maxillofacial structures were performed using the standard protocol without intravenous contrast. Multiplanar CT image reconstructions of the cervical spine and maxillofacial structures were also generated. COMPARISON:  MRA head 12/12/2017 FINDINGS: CT HEAD FINDINGS Brain: Cerebral ventricle sizes are concordant with the degree of cerebral volume loss. Loss Patchy and confluent areas of decreased attenuation are noted throughout the deep and periventricular white matter of the  cerebral hemispheres bilaterally, compatible with chronic microvascular  ischemic disease. No evidence of large-territorial acute infarction. No definite parenchymal hemorrhage. No mass lesion. Small moderate volume right frontoparietal sub arachnoid hemorrhage. No mass effect or midline shift. No hydrocephalus. No intraventricular extension of the hemorrhage. Basilar cisterns are patent. Vascular: No hyperdense vessel. Skull: No acute fracture or focal lesion. Other: None. CT MAXILLOFACIAL FINDINGS Osseous: No acute displaced fracture. Sinuses/Orbits: Paranasal sinuses and mastoid air cells are clear. Bilateral lens replacement. Otherwise the orbits are unremarkable. Soft tissues: Left maxillary subcutaneus soft tissue edema with associated 2 x 1 cm sub cutaneous hematoma formation. CT CERVICAL SPINE FINDINGS Alignment: Straightening of the normal cervical lordosis likely due to positioning. Grade 1 anterolisthesis of L3 on L4 and L4 on L5. Skull base and vertebrae: Severe multilevel degenerative changes of the cervical spine with multilevel intervertebral disc space narrowing which is most prominent at the C6-C7 levels. No acute fracture. No aggressive appearing focal osseous lesion or focal pathologic process. Soft tissues and spinal canal: No prevertebral fluid or swelling. No visible canal hematoma. Upper chest: Biapical pleural/pulmonary scarring. Other: None. IMPRESSION: 1. Right frontoparietal subarachnoid hemorrhage. Associated underlying intraparenchymal hemorrhage not excluded. Recommend repeat CT head in 6 hours. 2. No acute facial fracture in a patient with left maxillary subcutaneus soft tissue hematoma formation. 3. No acute displaced fracture or traumatic listhesis of the cervical spine in a patient with severe multilevel degenerative changes. These results were called by telephone at the time of interpretation on 08/24/2020 at 7:46 pm to provider Dr. Alvino Chapel, who verbally acknowledged these results. Electronically Signed   By: Iven Finn M.D.   On: 08/24/2020  19:53   CT Cervical Spine Wo Contrast  Result Date: 08/24/2020 CLINICAL DATA:  Status post fall 1 getting female. Possible loss of consciousness. Facial swelling left cheek. Abrasion. EXAM: CT HEAD WITHOUT CONTRAST CT MAXILLOFACIAL WITHOUT CONTRAST CT CERVICAL SPINE WITHOUT CONTRAST TECHNIQUE: Multidetector CT imaging of the head, cervical spine, and maxillofacial structures were performed using the standard protocol without intravenous contrast. Multiplanar CT image reconstructions of the cervical spine and maxillofacial structures were also generated. COMPARISON:  MRA head 12/12/2017 FINDINGS: CT HEAD FINDINGS Brain: Cerebral ventricle sizes are concordant with the degree of cerebral volume loss. Loss Patchy and confluent areas of decreased attenuation are noted throughout the deep and periventricular white matter of the cerebral hemispheres bilaterally, compatible with chronic microvascular ischemic disease. No evidence of large-territorial acute infarction. No definite parenchymal hemorrhage. No mass lesion. Small moderate volume right frontoparietal sub arachnoid hemorrhage. No mass effect or midline shift. No hydrocephalus. No intraventricular extension of the hemorrhage. Basilar cisterns are patent. Vascular: No hyperdense vessel. Skull: No acute fracture or focal lesion. Other: None. CT MAXILLOFACIAL FINDINGS Osseous: No acute displaced fracture. Sinuses/Orbits: Paranasal sinuses and mastoid air cells are clear. Bilateral lens replacement. Otherwise the orbits are unremarkable. Soft tissues: Left maxillary subcutaneus soft tissue edema with associated 2 x 1 cm sub cutaneous hematoma formation. CT CERVICAL SPINE FINDINGS Alignment: Straightening of the normal cervical lordosis likely due to positioning. Grade 1 anterolisthesis of L3 on L4 and L4 on L5. Skull base and vertebrae: Severe multilevel degenerative changes of the cervical spine with multilevel intervertebral disc space narrowing which is most  prominent at the C6-C7 levels. No acute fracture. No aggressive appearing focal osseous lesion or focal pathologic process. Soft tissues and spinal canal: No prevertebral fluid or swelling. No visible canal hematoma. Upper chest: Biapical pleural/pulmonary scarring. Other: None. IMPRESSION: 1. Right frontoparietal  subarachnoid hemorrhage. Associated underlying intraparenchymal hemorrhage not excluded. Recommend repeat CT head in 6 hours. 2. No acute facial fracture in a patient with left maxillary subcutaneus soft tissue hematoma formation. 3. No acute displaced fracture or traumatic listhesis of the cervical spine in a patient with severe multilevel degenerative changes. These results were called by telephone at the time of interpretation on 08/24/2020 at 7:46 pm to provider Dr. Alvino Chapel, who verbally acknowledged these results. Electronically Signed   By: Iven Finn M.D.   On: 08/24/2020 19:53   CT Maxillofacial Wo Contrast  Result Date: 08/24/2020 CLINICAL DATA:  Status post fall 1 getting female. Possible loss of consciousness. Facial swelling left cheek. Abrasion. EXAM: CT HEAD WITHOUT CONTRAST CT MAXILLOFACIAL WITHOUT CONTRAST CT CERVICAL SPINE WITHOUT CONTRAST TECHNIQUE: Multidetector CT imaging of the head, cervical spine, and maxillofacial structures were performed using the standard protocol without intravenous contrast. Multiplanar CT image reconstructions of the cervical spine and maxillofacial structures were also generated. COMPARISON:  MRA head 12/12/2017 FINDINGS: CT HEAD FINDINGS Brain: Cerebral ventricle sizes are concordant with the degree of cerebral volume loss. Loss Patchy and confluent areas of decreased attenuation are noted throughout the deep and periventricular white matter of the cerebral hemispheres bilaterally, compatible with chronic microvascular ischemic disease. No evidence of large-territorial acute infarction. No definite parenchymal hemorrhage. No mass lesion. Small  moderate volume right frontoparietal sub arachnoid hemorrhage. No mass effect or midline shift. No hydrocephalus. No intraventricular extension of the hemorrhage. Basilar cisterns are patent. Vascular: No hyperdense vessel. Skull: No acute fracture or focal lesion. Other: None. CT MAXILLOFACIAL FINDINGS Osseous: No acute displaced fracture. Sinuses/Orbits: Paranasal sinuses and mastoid air cells are clear. Bilateral lens replacement. Otherwise the orbits are unremarkable. Soft tissues: Left maxillary subcutaneus soft tissue edema with associated 2 x 1 cm sub cutaneous hematoma formation. CT CERVICAL SPINE FINDINGS Alignment: Straightening of the normal cervical lordosis likely due to positioning. Grade 1 anterolisthesis of L3 on L4 and L4 on L5. Skull base and vertebrae: Severe multilevel degenerative changes of the cervical spine with multilevel intervertebral disc space narrowing which is most prominent at the C6-C7 levels. No acute fracture. No aggressive appearing focal osseous lesion or focal pathologic process. Soft tissues and spinal canal: No prevertebral fluid or swelling. No visible canal hematoma. Upper chest: Biapical pleural/pulmonary scarring. Other: None. IMPRESSION: 1. Right frontoparietal subarachnoid hemorrhage. Associated underlying intraparenchymal hemorrhage not excluded. Recommend repeat CT head in 6 hours. 2. No acute facial fracture in a patient with left maxillary subcutaneus soft tissue hematoma formation. 3. No acute displaced fracture or traumatic listhesis of the cervical spine in a patient with severe multilevel degenerative changes. These results were called by telephone at the time of interpretation on 08/24/2020 at 7:46 pm to provider Dr. Alvino Chapel, who verbally acknowledged these results. Electronically Signed   By: Iven Finn M.D.   On: 08/24/2020 19:53    Procedures .Critical Care Performed by: Barrie Folk, PA-C Authorized by: Barrie Folk, PA-C    Critical care provider statement:    Critical care time (minutes):  35   Critical care time was exclusive of:  Separately billable procedures and treating other patients and teaching time   Critical care was necessary to treat or prevent imminent or life-threatening deterioration of the following conditions: Subarachnoid hemorrhage.   Critical care was time spent personally by me on the following activities:  Development of treatment plan with patient or surrogate, discussions with consultants, evaluation of patient's response to treatment, examination of  patient, obtaining history from patient or surrogate, ordering and performing treatments and interventions, ordering and review of laboratory studies, ordering and review of radiographic studies, pulse oximetry, re-evaluation of patient's condition and review of old charts   (including critical care time)  Medications Ordered in ED Medications  levETIRAcetam (KEPPRA) tablet 500 mg (has no administration in time range)    ED Course  I have reviewed the triage vital signs and the nursing notes.  Pertinent labs & imaging results that were available during my care of the patient were reviewed by me and considered in my medical decision making (see chart for details).    MDM Rules/Calculators/A&P                          History provided by patient with additional history obtained from chart review.    86 yo female presenting after mechanical fall. She is alert and oriented, GCS 15. Has superficial abrasion to bridge of nose not requiring repair. Also has swelling of left cheek without tenderness, no pain with EOMs.  EKG without stemi. Tetanus is up to date. I personally viewed and interpreted all images. CT head shows small moderate volume right frontoparietal sub arachnoid hemorrhage. Xray of left ribs without fracture. Ct cervical spine without acute findings. CT maxillofacial no fractures, does have left maxillary subcutaneus soft tissue  edema with associated 2 x 1 cm subcutaneous hematoma formation. Patient will need to be admitted labs were ordered. CBC with leukocytosis of 40, this is consistent with her baseline, hemoglobin 11.3 also consistent with baseline. CMP without significant electrolyte derangement, no renal insufficiency. Covid swab in process  On reassessment GCS still 15. Consulted on call neurosurgeon Dr. Marcello Moores who recommends patient being admitted to [facility] on the progressive unit.  She will need a repeat scan in the morning.  He also recommends that she start on Keppra 500 mg twice daily x7 days.  He asked for her blood pressure to be controlled with a MAP ranging from 70-90 and she should hold her daily aspirin. Spoke with Dr. Marlowe Sax with hospitalist service who agrees to assume care of patient and bring into the hospital for further evaluation and management.     Portions of this note were generated with [employer]. Dictation errors may occur despite best attempts at proofreading.   Final Clinical Impression(s) / ED Diagnoses Final diagnoses:  Subarachnoid hemorrhage [facility])    Rx / DC Orders ED Discharge Orders    None       Lewanda Rife 08/24/20 2127    Barrie Folk, PA-C 08/24/20 2130    Barrie Folk, PA-C 08/24/20 2217    Davonna Belling, MD 08/25/20 [phone]

## 2020-08-24 NOTE — ED Triage Notes (Signed)
Pt tripped and fell from standing going to get her mail. Possible LOC. C/o facial pain with swelling noted to left cheek and small abrasion noted to nose.

## 2020-08-25 ENCOUNTER — Observation Stay (HOSPITAL_COMMUNITY): Payer: Medicare Other

## 2020-08-25 ENCOUNTER — Encounter (HOSPITAL_COMMUNITY): Payer: Self-pay | Admitting: Internal Medicine

## 2020-08-25 ENCOUNTER — Other Ambulatory Visit: Payer: Self-pay

## 2020-08-25 DIAGNOSIS — S066X9A Traumatic subarachnoid hemorrhage with loss of consciousness of unspecified duration, initial encounter: Secondary | ICD-10-CM | POA: Diagnosis present

## 2020-08-25 DIAGNOSIS — E871 Hypo-osmolality and hyponatremia: Secondary | ICD-10-CM | POA: Diagnosis present

## 2020-08-25 DIAGNOSIS — Z888 Allergy status to other drugs, medicaments and biological substances status: Secondary | ICD-10-CM | POA: Diagnosis not present

## 2020-08-25 DIAGNOSIS — S0121XA Laceration without foreign body of nose, initial encounter: Secondary | ICD-10-CM | POA: Diagnosis present

## 2020-08-25 DIAGNOSIS — Z880 Allergy status to penicillin: Secondary | ICD-10-CM | POA: Diagnosis not present

## 2020-08-25 DIAGNOSIS — I609 Nontraumatic subarachnoid hemorrhage, unspecified: Secondary | ICD-10-CM

## 2020-08-25 DIAGNOSIS — S0001XA Abrasion of scalp, initial encounter: Secondary | ICD-10-CM | POA: Diagnosis present

## 2020-08-25 DIAGNOSIS — I10 Essential (primary) hypertension: Secondary | ICD-10-CM | POA: Diagnosis present

## 2020-08-25 DIAGNOSIS — D630 Anemia in neoplastic disease: Secondary | ICD-10-CM | POA: Diagnosis present

## 2020-08-25 DIAGNOSIS — E039 Hypothyroidism, unspecified: Secondary | ICD-10-CM | POA: Diagnosis present

## 2020-08-25 DIAGNOSIS — Z87891 Personal history of nicotine dependence: Secondary | ICD-10-CM | POA: Diagnosis not present

## 2020-08-25 DIAGNOSIS — R29810 Facial weakness: Secondary | ICD-10-CM | POA: Diagnosis present

## 2020-08-25 DIAGNOSIS — E861 Hypovolemia: Secondary | ICD-10-CM | POA: Diagnosis present

## 2020-08-25 DIAGNOSIS — G500 Trigeminal neuralgia: Secondary | ICD-10-CM | POA: Diagnosis present

## 2020-08-25 DIAGNOSIS — L8991 Pressure ulcer of unspecified site, stage 1: Secondary | ICD-10-CM | POA: Diagnosis not present

## 2020-08-25 DIAGNOSIS — M810 Age-related osteoporosis without current pathological fracture: Secondary | ICD-10-CM | POA: Diagnosis present

## 2020-08-25 DIAGNOSIS — R54 Age-related physical debility: Secondary | ICD-10-CM | POA: Diagnosis present

## 2020-08-25 DIAGNOSIS — W010XXA Fall on same level from slipping, tripping and stumbling without subsequent striking against object, initial encounter: Secondary | ICD-10-CM | POA: Diagnosis present

## 2020-08-25 DIAGNOSIS — Z96611 Presence of right artificial shoulder joint: Secondary | ICD-10-CM | POA: Diagnosis present

## 2020-08-25 DIAGNOSIS — D539 Nutritional anemia, unspecified: Secondary | ICD-10-CM | POA: Diagnosis present

## 2020-08-25 DIAGNOSIS — C9110 Chronic lymphocytic leukemia of B-cell type not having achieved remission: Secondary | ICD-10-CM | POA: Diagnosis present

## 2020-08-25 DIAGNOSIS — Z20822 Contact with and (suspected) exposure to covid-19: Secondary | ICD-10-CM | POA: Diagnosis present

## 2020-08-25 DIAGNOSIS — Z7982 Long term (current) use of aspirin: Secondary | ICD-10-CM | POA: Diagnosis not present

## 2020-08-25 DIAGNOSIS — Z79899 Other long term (current) drug therapy: Secondary | ICD-10-CM | POA: Diagnosis not present

## 2020-08-25 DIAGNOSIS — Z7983 Long term (current) use of bisphosphonates: Secondary | ICD-10-CM | POA: Diagnosis not present

## 2020-08-25 DIAGNOSIS — G8194 Hemiplegia, unspecified affecting left nondominant side: Secondary | ICD-10-CM | POA: Diagnosis present

## 2020-08-25 NOTE — Consult Note (Addendum)
CC: head trauma  HPI:     Patient is a 86 y.o. female presented to the hospital after a fall while getting the mail, falling on her left side.  After the fall, she complained of left rib pain and pleuritic pain, but no headache.  She did have an abrasion on her head.  A CT head showed tSAH and small underlying contusion.  A repeat CT performed this morning was stable.  She was taking aspirin.   Patient Active Problem List   Diagnosis Date Noted  . Subarachnoid hemorrhage ([place]) 08/24/2020  . Fall 08/24/2020  . Macrocytic anemia 08/24/2020  . Bilateral leg edema 03/07/2020  . Right shoulder pain 05/24/2017  . Pancytopenia, acquired ([place]) 02/12/2017  . Other constipation 02/12/2017  . Goals of care, counseling/discussion 01/24/2017  . Lymphoma, marginal zone, lymph nodes of multiple sites ([place]) 12/24/2016  . Anemia in neoplastic disease 12/24/2016  . Trigeminal neuralgia 08/13/2016  . Elevated total protein 06/26/2016  . History of B-cell lymphoma 11/02/2015   Past Medical History:  Diagnosis Date  . Cancer [facility])    non hodgkin lymphoma  . Chronic lymphocytic leukemia ([place])   . History of B-cell lymphoma 11/02/2015  . Migraines   . Osteoporosis   . Thyroid disease   . Trigeminal neuralgia of left side of face     Past Surgical History:  Procedure Laterality Date  . gamma knife     for trigeminal neuralgia  . SHOULDER SURGERY     Right shoulder replacement  . TONSILLECTOMY AND ADENOIDECTOMY    . WISDOM TOOTH EXTRACTION      Medications Prior to Admission  Medication Sig Dispense Refill Last Dose  . aspirin 81 MG tablet Take 81 mg by mouth at bedtime.    08/24/2020 at Unknown time  . Biotin 10 MG CAPS Take 10 mg by mouth daily.   08/24/2020 at Unknown time  . calcium-vitamin D (OSCAL WITH D) 500-200 MG-UNIT tablet Take 1 tablet by mouth daily.    08/24/2020 at Unknown time  . gabapentin (NEURONTIN) 800 MG tablet Take 800 mg by mouth 3 (three) times daily.   08/24/2020 at AM   . levothyroxine (SYNTHROID) 75 MCG tablet Take 75 mcg by mouth daily before breakfast.   08/24/2020 at Unknown time  . Multiple Vitamin (MULTI-VITAMINS) TABS Take 1 tablet by mouth daily.   08/24/2020 at Unknown time  . propranolol (INDERAL) 10 MG tablet Take 10 mg by mouth daily.   08/24/2020 at 0800-0900   Allergies  Allergen Reactions  . Penicillins Hives  . Carbamazepine Other (See Comments) and Hives    Turned purple from neck down and drowsy Other reaction(s): Other (See Comments) Turned purple from neck down and drowsy  . Pregabalin Other (See Comments)    Drowsy Other reaction(s): Confusion (intolerance) Drowsy    Social History   Tobacco Use  . Smoking status: Former Smoker    Packs/day: 0.50    Years: 20.00    Pack years: 10.00    Quit date: 10/02/1975    Years since quitting: 44.9  . Smokeless tobacco: Never Used  Substance Use Topics  . Alcohol use: Yes    Alcohol/week: 2.0 standard drinks    Types: 2 Glasses of wine per week    Family History  Problem Relation Age of Onset  . Cancer Father        unknown ca  . Pneumonia Father   . Cancer Brother        prostate  ca  . Stroke Brother   . Heart attack Mother      Review of Systems Pertinent items noted in HPI and remainder of comprehensive ROS otherwise negative.  Objective:   Patient Vitals for the past 8 hrs:  BP Temp Temp src Pulse Resp SpO2  08/25/20 0500 (!) 114/58 97.8 F (36.6 C) Oral 97 18 94 %  08/25/20 0430 119/61 -- -- (!) 102 14 93 %  08/25/20 0416 103/64 -- -- (!) 101 14 95 %  08/25/20 0330 122/67 -- -- (!) 102 19 92 %  08/25/20 0230 (!) 123/57 -- -- 99 16 93 %  08/25/20 0200 121/62 -- -- 98 18 95 %   No intake/output data recorded. No intake/output data recorded.    General : Alert, cooperative, no distress, appears stated age   Head:  Left scalp abrasion.    Eyes: PERRL, conjunctiva/corneas clear, EOM's intact. Fundi could not be visualized. Neck: Supple Chest:  Respirations  unlabored Chest wall: no tenderness or deformity Heart: Regular rate and rhythm Abdomen: Soft, nontender and nondistended Extremities: warm and well-perfused Skin: normal turgor, color and texture Neurologic:  Alert, oriented x 3.  Eyes open spontaneously. PERRL, EOMI, VFC, mild left facial droop. V1-3 intact.  No dysarthria, tongue protrusion symmetric.  CNII-XII intact. Normal strength, sensation and reflexes throughout.  Left sided pronator drift, full strength in legs       Data Review CBC:  Lab Results  Component Value Date   WBC 40.0 (H) 08/24/2020   RBC 3.31 (L) 08/24/2020   BMP:  Lab Results  Component Value Date   GLUCOSE 93 08/24/2020   GLUCOSE 88 08/16/2017   CHLORIDE 105 08/16/2017   CO2 24 08/24/2020   CO2 25 08/16/2017   BUN 20 08/24/2020   BUN 19.6 08/16/2017   CREATININE 0.76 08/24/2020   CREATININE 0.7 08/16/2017   CALCIUM 8.6 (L) 08/24/2020   CALCIUM 9.2 08/16/2017   Coagulation:  Lab Results  Component Value Date   INR 1.21 02/18/2017   Radiology review: CT and repeat CT head without contrast was reviewed.  Right frontoparietal tSAH with likely underlying countrecoup contusion.  No change on repeat CT head.  Assessment:  S/p fall with right frontoparietal tSAH and underlying brain contusion, stable on repeat CT  Plan:  - no neurosurgical intervention indicated - recommend Keppra 500 mg PO BID x 7 days - keep MAPs 70-100 - hold aspirin for 2 weeks - patient has mild left sided weakness likely related to perirolandic brain contusion.  PT/OT. - if clinically indicated, ok to start pharmacologic DVT prophylaxis on 11/26 - will need repeat CT head without contrast in 2 weeks with f/u in neurosurgery clinic after CT is done

## 2020-08-25 NOTE — ED Notes (Signed)
[facility] picking up patient and taking her to [place] 

## 2020-08-25 NOTE — Evaluation (Signed)
Physical Therapy Evaluation Patient Details Name: Joy Lynch MRN: [id number] DOB: 10/23/1925 Today's Date: 08/25/2020   History of Present Illness  Joy Lynch is a 86 y.o. female with medical history significant of marginal zone lymphoma followed by oncology, migraines, osteoporosis, hypertension, hypothyroidism, trigeminal neuralgia presenting to the ED via EMS from her independent nursing facility after a witnessed fall which happened just prior to arrival.   Clinical Impression  Pt presents to PT with deficits in balance, gait, functional mobility, and strength. Pt with tendency to lean posteriorly during dynamic sitting tasks and is unable to safely don socks during this session. Pt also requires assistance to mobilize in bed at this time. Pt is able to transfer and ambulate for household distances with UE support. PT recommends returning to [place] to the skilled rehab section initially to improve balance and gait quality, as well as increased assistance.    Follow Up Recommendations SNF ([facility])    Equipment Recommendations  Other (comment) (4 wheeled walker with brakes and seat)    Recommendations for Other Services       Precautions / Restrictions Precautions Precautions: Fall Restrictions Weight Bearing Restrictions: No      Mobility  Bed Mobility Overal bed mobility: Needs Assistance Bed Mobility: Supine to Sit     Supine to sit: Min assist;HOB elevated          Transfers Overall transfer level: Needs assistance Equipment used: None Transfers: Sit to/from Stand Sit to Stand: Min guard            Ambulation/Gait Ambulation/Gait assistance: Min guard Gait Distance (Feet): 100 Feet (15' without device) Assistive device: Rolling walker (2 wheeled);None Gait Pattern/deviations: Step-through pattern Gait velocity: reduced Gait velocity interpretation: <1.8 ft/sec, indicate of risk for recurrent falls General Gait  Details: pt with slowed but steady steo through gait with use of RW  Stairs            Wheelchair Mobility    Modified Rankin (Stroke Patients Only) Modified Rankin (Stroke Patients Only) Pre-Morbid Rankin Score: No symptoms Modified Rankin: Moderately severe disability     Balance Overall balance assessment: Needs assistance Sitting-balance support: No upper extremity supported;Feet supported Sitting balance-Leahy Scale: Poor Sitting balance - Comments: minG at edge of bed with dynamic sitting balance Postural control: Posterior lean Standing balance support: No upper extremity supported Standing balance-Leahy Scale: Fair                               Pertinent Vitals/Pain Pain Assessment: Faces Faces Pain Scale: Hurts little more Pain Location: L side at ribs Pain Descriptors / Indicators: Aching Pain Intervention(s): Monitored during session    Home Living Family/patient expects to be discharged to:: Assisted living               Home Equipment: Kasandra Knudsen - single point      Prior Function Level of Independence: Needs assistance   Gait / Transfers Assistance Needed: independent with mobility  ADL's / Homemaking Assistance Needed: pt performs all ADLs, goes to dining hall for all meals        Hand Dominance        Extremity/Trunk Assessment   Upper Extremity Assessment Upper Extremity Assessment: Overall WFL for tasks assessed    Lower Extremity Assessment Lower Extremity Assessment: Generalized weakness    Cervical / Trunk Assessment Cervical / Trunk Assessment: Other exceptions (scoliosis)  Communication  Communication: HOH  Cognition Arousal/Alertness: Awake/alert Behavior During Therapy: WFL for tasks assessed/performed Overall Cognitive Status: Within Functional Limits for tasks assessed                                        General Comments General comments (skin integrity, edema, etc.): VSS on RA     Exercises     Assessment/Plan    PT Assessment Patient needs continued PT services  PT Problem List Decreased strength;Decreased activity tolerance;Decreased balance;Decreased mobility       PT Treatment Interventions DME instruction;Gait training;Therapeutic activities;Functional mobility training;Therapeutic exercise;Balance training;Neuromuscular re-education;Patient/family education    PT Goals (Current goals can be found in the Care Plan section)  Acute Rehab PT Goals Patient Stated Goal: To return to her prior level of function PT Goal Formulation: With patient/family Time For Goal Achievement: 09/08/20 Potential to Achieve Goals: Good    Frequency Min 3X/week   Barriers to discharge        Co-evaluation               AM-PAC PT "6 Clicks" Mobility  Outcome Measure Help needed turning from your back to your side while in a flat bed without using bedrails?: None Help needed moving from lying on your back to sitting on the side of a flat bed without using bedrails?: A Little Help needed moving to and from a bed to a chair (including a wheelchair)?: A Little Help needed standing up from a chair using your arms (e.g., wheelchair or bedside chair)?: A Little Help needed to walk in hospital room?: A Little Help needed climbing 3-5 steps with a railing? : A Lot 6 Click Score: 18    End of Session   Activity Tolerance: Patient tolerated treatment well Patient left: in chair;with call bell/phone within reach;with chair alarm set;with family/visitor present Nurse Communication: Mobility status PT Visit Diagnosis: Other abnormalities of gait and mobility (R26.89);Muscle weakness (generalized) (M62.81)    Time: 4854-6270 PT Time Calculation (min) (ACUTE ONLY): 35 min   Charges:   PT Evaluation $PT Eval Low Complexity: 1 Low PT Treatments $Gait Training: 8-22 mins        Zenaida Niece, PT, DPT Acute Rehabilitation Pager: [phone]   Zenaida Niece 08/25/2020,  10:48 AM

## 2020-08-25 NOTE — ED Notes (Signed)
[facility] called for pt transport  

## 2020-08-25 NOTE — Progress Notes (Signed)
PROGRESS NOTE  Joy Lynch TJQ:[id number] DOB: 02/07/1926 DOA: 08/24/2020 PCP: Cari Caraway, MD  Brief History   Joy Lynch is a 86 y.o. female with medical history significant of marginal zone lymphoma followed by oncology, migraines, osteoporosis, hypertension, hypothyroidism, trigeminal neuralgia presenting to the ED via EMS from her independent nursing facility after a witnessed fall which happened just prior to arrival.  Patient states she was walking to the dining room at her facility and somehow tripped and fell on her face.  She denies any preceding dizziness, chest pain, or shortness of breath.  She believes she might have lost consciousness after she fell on the ground and hit her face.  Her left lower ribs are sore since the fall but denies any other injuries.  Denies headaches, neck pain, back pain, or pain anywhere else.  She takes aspirin 81 mg daily.  Denies history of seizures.  ED Course: Hemodynamically stable.  WBC 40.0, hemoglobin 11.3, hematocrit 35.1, platelet 168K.  Sodium 138, potassium 4.8, chloride 105, bicarb 24, BUN 20, creatinine 0.7, glucose 93.  LFTs normal.  Screening SARS-CoV-2 PCR test and influenza panel pending.  X-ray of left chest/ribs showing no acute rib fracture.  Head CT showing right frontoparietal subarachnoid hemorrhage.  Associated underlying intraparenchymal hemorrhage not excluded.  Repeat CT scan recommended in 6 hours.  CT maxillofacial showing left maxillary subcutaneous soft tissue hematoma formation but no acute facial fracture.  CT C-spine negative for acute finding.  ED provider discussed the case with on-call neurosurgeon Dr. Marcello Moores who requested admission under medicine service at [facility].  Recommended repeat CT scan in the morning.  Also recommended starting the patient on Keppra 500 mg twice daily x7 days.  Recommended keeping blood pressure well controlled with MAP ranging between 70-90.  Patient was given p.o. Keppra 500 mg in the  ED.  [facility] were consulted to admit the patient for further evaluation and treatment.   Repeat CT brain this morning demonstrated no change over the previous scan.   The patient is being evaluated by PT/OT.  Consultants  . Neurosurgery  Procedures  . None  Antibiotics   Anti-infectives (From admission, onward)   None    .  Subjective  The patient is up and working with PT. She denies any significant changes since yesterday. No new complaints.  Objective   Vitals:  Vitals:   08/25/20 1225 08/25/20 1237  BP: (!) 104/52 (!) 87/42  Pulse: 99 85  Resp: 18 20  Temp: 97.6 F (36.4 C) 98 F (36.7 C)  SpO2:  96%   Exam:  Constitutional:  . The patient is awake, alert, and oriented x 3. No acute distress. Respiratory:  . No increased work of breathing. . No wheezes, rales, or rhonchi . No tactile fremitus Cardiovascular:  . Regular rate and rhythm . No murmurs, ectopy, or gallups. . No lateral PMI. No thrills. Abdomen:  . Abdomen is soft, non-tender, non-distended . No hernias, masses, or organomegaly . Normoactive bowel sounds.  Musculoskeletal:  . No cyanosis, clubbing, or edema Skin:  . No rashes, lesions, ulcers . palpation of skin: no induration or nodules . Left side of face with significant ecchymoses Neurologic:  . CN 2-12 intact . Sensation all 4 extremities intact . Movign all extremities Psychiatric:  . Mental status o Mood, affect appropriate o Orientation to person, place, time  . judgment and insight appear intact  I have personally reviewed the following:   Today's Data  . Vitals, CMP, CBC  Imaging  . CT head without contrast  Scheduled Meds: . calcium-vitamin D  1 tablet Oral Daily  . gabapentin  800 mg Oral TID  . levETIRAcetam  500 mg Oral BID  . levothyroxine  75 mcg Oral Q0600  . multivitamin with minerals  1 tablet Oral Daily  . propranolol  10 mg Oral Daily   Continuous Infusions:  Principal Problem:    Subarachnoid hemorrhage ([facility]) Active Problems:   Trigeminal neuralgia   Lymphoma, marginal zone, lymph nodes of multiple sites ([place])   Fall   Macrocytic anemia   SAH (subarachnoid hemorrhage) ([facility])   LOS: 0 days   A & P  Subarachnoid hemorrhage/ possible intraparenchymal hemorrhage with an underlying countrecoup contusion: Patient had a witnessed fall prior to ED arrival at her independent living facility.  She takes aspirin 81 mg daily. Head CT showing right frontoparietal subarachnoid hemorrhage.  Associated underlying intraparenchymal hemorrhage not excluded.  ED provider discussed the case with on-call neurosurgeon Dr. Marcello Moores who requested admission under medicine service at [facility].  Recommended repeat CT scan in the morning.  Also recommended starting the patient on Keppra 500 mg twice daily x 7 days.  Recommended keeping blood pressure well controlled with MAP ranging between 70-90. The patient was admitted to progressive care unit at [facility]. Repeat CT head this am was unchanged.  Neuro exam currently nonfocal, continue frequent neurochecks. Neurosurgery has evaluated the patient. He finds that she has mild left sided weakness due to the perirolandic brain contusion. Their recommendations included: PT/OT eval and treat, repeat head CT scan in 2 weeks, hold aspirin for two weeks, patient is to take Keppra 500 mg twice daily for 7 days.  Monitor blood pressure closely with goal MAP ranging between 70-100. No neurosurgical intervention is indicated. Pharmacological DVT prophylaxis may be restarted on 08/26/2020.   Fall: Appears to be mechanical. Fall precautions, PT/OT eval  Marginal zone lymphoma, lymph nodes of multiple sites: The patient is followed by Dr. Marden Noble from oncology and currently not on treatment due to advanced age and frail status.  Labs done today showing WBC 40.0, differential pending.  WBC count elevated at 35.6 on labs done on 06/27/2020 during oncology office visit.  She Joy be asked to follow up with oncology as outpatient.  Macrocytic anemia: Hemoglobin 11.3, hematocrit 35.1, MCV 106.0.  Hemoglobin appears to be at baseline.  Patient is followed by heme-onc. She Joy be asked to follow up with oncology as outpatient.  Hypertension: Continue home propranolol.  IV hydralazine PRN SBP >160. Monitor blood pressure closely with goal MAP ranging between 70-100 per neurosurgery recommendation.  Trigeminal neuralgia: Noted. Continue home gabapentin  Hypothyroidism: Continue Synthroid as at home.  I have seen and examined this patient myself. I have spent 45 minutes in her evaluation and care. More than 50% of this was spent in counseling with the patient's nephew, Joy Lynch. All questions answered to the best of my ability.  DVT prophylaxis: SCDs Code Status: Patient wishes to be full code. Family Communication: Nephew at bedside. Also called him at his request later in the day.  Disposition Plan:  Status is: Inpatient  Remains inpatient appropriate because:Ongoing diagnostic testing needed not appropriate for outpatient work up  Dispo: The patient is from: Home              Anticipated d/c is to: Home              Anticipated d/c date is: 1 day  Patient currently is not medically stable to d/c.  Jalisha Enneking, DO [facility] Direct contact: see [email or web address]  7PM-7AM contact night coverage as above 08/25/2020, 2:25 PM  LOS: 0 days

## 2020-08-25 NOTE — Evaluation (Signed)
Clinical/Bedside Swallow Evaluation Patient Details  Name: Joy Lynch MRN: [id number] Date of Birth: June 30, 1926  Today's Date: 08/25/2020 Time: SLP Start Time (ACUTE ONLY): 1233 SLP Stop Time (ACUTE ONLY): 1248 SLP Time Calculation (min) (ACUTE ONLY): 15 min  Past Medical History:  Past Medical History:  Diagnosis Date  . Cancer [facility])    non hodgkin lymphoma  . Chronic lymphocytic leukemia ([place])   . History of B-cell lymphoma 11/02/2015  . Migraines   . Osteoporosis   . Thyroid disease   . Trigeminal neuralgia of left side of face    Past Surgical History:  Past Surgical History:  Procedure Laterality Date  . gamma knife     for trigeminal neuralgia  . SHOULDER SURGERY     Right shoulder replacement  . TONSILLECTOMY AND ADENOIDECTOMY    . WISDOM TOOTH EXTRACTION     HPI:  Joy Lynch is a 86 y.o. female with medical history significant of marginal zone lymphoma followed by oncology, migraines, osteoporosis, hypertension, hypothyroidism, trigeminal neuralgia presenting to the ED via EMS from her independent nursing facility after a witnessed fall which happened just prior to arrival.    Assessment / Plan / Recommendation Clinical Impression  Pt presents with normal oropharyngeal swallow function with good attention and awareness, normal mastication, the appearance of a brisk swallow response, and no s/s of aspiration despite mixed consistencies.  recommend resuming a regular diet, thin liquids.  No dysphagia. SLP to sign off.  SLP Visit Diagnosis: Dysphagia, unspecified (R13.10)    Aspiration Risk  No limitations    Diet Recommendation   regular solids, thin liquids  Medication Administration: Whole meds with liquid    Other  Recommendations Oral Care Recommendations: Oral care BID   Follow up Recommendations None        Swallow Study   General HPI: Joy Lynch is a 86 y.o. female with medical history significant of marginal zone lymphoma followed by oncology,  migraines, osteoporosis, hypertension, hypothyroidism, trigeminal neuralgia presenting to the ED via EMS from her independent nursing facility after a witnessed fall which happened just prior to arrival.  Type of Study: Bedside Swallow Evaluation Previous Swallow Assessment: no Diet Prior to this Study: NPO Temperature Spikes Noted: No Respiratory Status: Room air History of Recent Intubation: No Behavior/Cognition: Alert;Cooperative;Pleasant mood Oral Cavity Assessment: Within Functional Limits;Other (comment) (edema/bruising left lower face after fall) Oral Care Completed by SLP: No Oral Cavity - Dentition: Adequate natural dentition Vision: Functional for self-feeding Self-Feeding Abilities: Able to feed self Patient Positioning: Upright in chair Baseline Vocal Quality: Normal Volitional Cough: Strong Volitional Swallow: Able to elicit    Oral/Motor/Sensory Function Overall Oral Motor/Sensory Function: Within functional limits   Ice Chips Ice chips: Within functional limits   Thin Liquid Thin Liquid: Within functional limits    Nectar Thick Nectar Thick Liquid: Not tested   Honey Thick Honey Thick Liquid: Not tested   Puree Puree: Within functional limits   Solid     Solid: Within functional limits Presentation: Self Fed      Juan Quam Laurice 08/25/2020,12:48 PM  Estill Bamberg L. Tivis Ringer, [place] Office number [phone] Pager [phone]

## 2020-08-25 NOTE — Evaluation (Signed)
Occupational Therapy Evaluation Patient Details Name: Joy Lynch MRN: [id number] DOB: Mar 24, 1926 Today's Date: 08/25/2020    History of Present Illness Joy Lynch is a 86 y.o. female with medical history significant of marginal zone lymphoma followed by oncology, migraines, osteoporosis, hypertension, hypothyroidism, trigeminal neuralgia presenting to the ED via EMS from her independent nursing facility after a witnessed fall which happened just prior to arrival.    Clinical Impression   PTA, pt resides at [facility]. Pt reports Independence with mobility, ADLs and laundry tasks. Pt goes to dining hall for meals. Pt presents with deficits in balance, strength and endurance. Pt received on 2 L O2, trialed mobility on RA with SpO2 sustained low 90s with and without O2. Pt overall Min A for short mobility without AD with noted instability noted. Encouraged pt to use RW for mobility at this time to decrease fall risk. Pt requires Setup Assist for UB ADLs and Min A for LB ADLs. Educated on safety techniques, such as sitting for showering tasks to decrease fall risk due to impaired balance. Pt would benefit from short term rehab at SNF to maximize independence/safety and decrease fall risk. Pt pleasant and motivated to return to PLOF.     Follow Up Recommendations  SNF;Supervision/Assistance - 24 hour (SNF part of [facility]; HHOT if pt declines SNF)    Equipment Recommendations  Other (comment) (Rollator)    Recommendations for Other Services       Precautions / Restrictions Precautions Precautions: Fall Precaution Comments: monitor O2 Restrictions Weight Bearing Restrictions: No      Mobility Bed Mobility Overal bed mobility: Needs Assistance Bed Mobility: Supine to Sit     Supine to sit: Min assist;HOB elevated     General bed mobility comments: received in recliner    Transfers Overall transfer level: Needs assistance Equipment used: 1 person hand held  assist Transfers: Sit to/from [facility] Sit to Stand: Min assist Stand pivot transfers: Min assist       General transfer comment: Min A for steadying with increased attempts needed for sit to stand transfer without AD. MIn A for steadying with handheld assist in room for mobility and turning at chair, swaying noted    Balance Overall balance assessment: Needs assistance Sitting-balance support: No upper extremity supported;Feet supported Sitting balance-Leahy Scale: Fair Sitting balance - Comments: minG at edge of bed with dynamic sitting balance Postural control: Posterior lean Standing balance support: Single extremity supported;During functional activity Standing balance-Leahy Scale: Fair Standing balance comment: fair static standing without support, need for UE support during dynamic tasks                           ADL either performed or assessed with clinical judgement   ADL Overall ADL's : Needs assistance/impaired Eating/Feeding: Independent;Sitting Eating/Feeding Details (indicate cue type and reason): Independent for lunch, opening of containers Grooming: Min guard;Standing   Upper Body Bathing: Set up;Sitting   Lower Body Bathing: Min guard;Sit to/from stand   Upper Body Dressing : Set up;Sitting   Lower Body Dressing: Minimal assistance;Sit to/from stand Lower Body Dressing Details (indicate cue type and reason): MIn A for maintaining balance for dressing above waist without UE support, able to reach B feet Toilet Transfer: Minimal assistance;Ambulation Toilet Transfer Details (indicate cue type and reason): Min A for ambulation with handheld assist in room. Pt reports using RW earlier with PT and would like to avoid using that if possible. Pt  noted with swaying when walking - agreeable to use walker to decrease assist needed for mobility  Toileting- Clothing Manipulation and Hygiene: Min guard;Sit to/from stand       Functional  mobility during ADLs: Minimal assistance (handheld assist) General ADL Comments: Pt limited by balance deficits, pt aware of balance deficits and verbalized understanding of safety during ADLs. Recommended pt to sit for showers to decrease fall risk, use AD for mobility initially, etc     Vision Patient Visual Report: No change from baseline Vision Assessment?: No apparent visual deficits     Perception     Praxis      Pertinent Vitals/Pain Pain Assessment: No/denies pain Faces Pain Scale: Hurts little more Pain Location: L side at ribs Pain Descriptors / Indicators: Aching Pain Intervention(s): Monitored during session     Hand Dominance Right   Extremity/Trunk Assessment Upper Extremity Assessment Upper Extremity Assessment: Overall WFL for tasks assessed   Lower Extremity Assessment Lower Extremity Assessment: Defer to PT evaluation   Cervical / Trunk Assessment Cervical / Trunk Assessment: Normal   Communication Communication Communication: HOH   Cognition Arousal/Alertness: Awake/alert Behavior During Therapy: WFL for tasks assessed/performed Overall Cognitive Status: No family/caregiver present to determine baseline cognitive functioning                                 General Comments: A&Ox4, does not recall how fall happened, but cognition intact in all other areas   General Comments  Received on 2 L O2 but [place] half out of pt's nose. SpO2 at 92%, remained in low 90s on RA during activity and seated in chair, reduced to 1 L O2, notified RN. HR WFL    Exercises     Shoulder Instructions      Home Living Family/patient expects to be discharged to:: Assisted living                             Home Equipment: Kasandra Knudsen - single point;Shower seat - built in;Grab bars - tub/shower;Hand held shower head          Prior Functioning/Environment Level of Independence: Needs assistance  Gait / Transfers Assistance Needed: independent with  mobility, denies any other fall for the past 30 years ADL's / Homemaking Assistance Needed: Pt performs all ADLs, stands for showers, does her own laundry. Pt goes to dining hall for meals Communication / Swallowing Assistance Needed: hard of hearing          OT Problem List: Decreased strength;Decreased activity tolerance;Impaired balance (sitting and/or standing);Decreased knowledge of use of DME or AE      OT Treatment/Interventions: Self-care/ADL training;Therapeutic exercise;DME and/or AE instruction;Therapeutic activities;Patient/family education;Balance training    OT Goals(Current goals can be found in the care plan section) Acute Rehab OT Goals Patient Stated Goal: work on balance, be able to walk without walker OT Goal Formulation: With patient Time For Goal Achievement: 09/08/20 Potential to Achieve Goals: Good ADL Goals Pt Will Perform Grooming: with modified independence;standing Pt Will Perform Lower Body Bathing: with modified independence;sit to/from stand Pt Will Perform Lower Body Dressing: with modified independence;sit to/from stand Pt Will Transfer to Toilet: with modified independence;regular height toilet;ambulating Pt/caregiver will Perform Home Exercise Program: Increased strength;Both right and left upper extremity;With theraband;Independently;With written HEP provided Additional ADL Goal #1: Pt to demonstrate light housekeeping tasks in standing at Supervision level and no LOB  OT Frequency: Min 2X/week   Barriers to D/C:            Co-evaluation              AM-PAC OT "6 Clicks" Daily Activity     Outcome Measure Help from another person eating meals?: None Help from another person taking care of personal grooming?: A Little Help from another person toileting, which includes using toliet, bedpan, or urinal?: A Little Help from another person bathing (including washing, rinsing, drying)?: A Little Help from another person to put on and taking  off regular upper body clothing?: A Little Help from another person to put on and taking off regular lower body clothing?: A Little 6 Click Score: 19   End of Session Equipment Utilized During Treatment: Gait belt;Oxygen Nurse Communication: Mobility status  Activity Tolerance: Patient tolerated treatment well Patient left: in chair;with call bell/phone within reach;with chair alarm set  OT Visit Diagnosis: Unsteadiness on feet (R26.81);Other abnormalities of gait and mobility (R26.89);Muscle weakness (generalized) (M62.81);History of falling (Z91.81)                Time: 0301-3143 OT Time Calculation (min): 20 min Charges:  OT General Charges $OT Visit: 1 Visit OT Evaluation $OT Eval Moderate Complexity: 1 Mod  Layla Maw, OTR/L  Layla Maw 08/25/2020, 2:06 PM

## 2020-08-25 NOTE — Plan of Care (Signed)

## 2020-08-25 NOTE — ED Notes (Signed)
Report given to Maudie Mercury, Therapist, sports at [facility].  Pt SBAR information covered at this time.  Pt resting quietly in bed.  NADN.  Will continue to monitor.

## 2020-08-26 LAB — PATHOLOGIST SMEAR REVIEW

## 2020-08-26 MED ORDER — HYPROMELLOSE (GONIOSCOPIC) 2.5 % OP SOLN
1.0000 [drp] | Freq: Four times a day (QID) | OPHTHALMIC | Status: DC | PRN
  Filled 2020-08-26: qty 15

## 2020-08-26 NOTE — TOC Initial Note (Addendum)
Transition of Care [facility]) - Initial/Assessment Note    Patient Details  Name: Joy Lynch MRN: [id number] Date of Birth: 1925-12-26  Transition of Care [facility]) CM/SW Contact:    Joanne Chars, LCSW Phone Number: 08/26/2020, 10:40 AM  Clinical Narrative:  CSW met with pt to discuss discharge plan.  Pt is resident at [facility], independent living, wants to return there.   Pt is aware of SNF recommendation and prefers to return to her independent living apartment.  Pt is vaccinated for covid.  Permission given to speak to nephews Will and Khushbu Pippen and to [facility].  Pt reports only current equipment she has is a cane.  CSW spoke with Rebbeca Paul at [facility] and she reports nephew Sharman Crate is POA and is in favor of pt going to SNF wing, not back to independent living apartment.  CSW spoke with [name] who confirms this.  TC LM with Arley Phenix at [facility] [place] submitted to [place].         1615: TC [place].  If Josem Kaufmann is approved, For admit over the weekend to [facility], call [phone] and ask for RN for room 36.  They don't have hub access on weekends and DC summary and negative covid will need to be faxed to [phone].            Expected Discharge Plan: Skilled Nursing Facility Barriers to Discharge: Insurance Authorization   Patient Goals and CMS Choice Patient states their goals for this hospitalization and ongoing recovery are:: less pain after her fall CMS Medicare.gov Compare Post Acute Care list provided to::  (Pt current resident at [facility], will return there)    Expected Discharge Plan and Services Expected Discharge Plan: [place] Choice: [place] Living arrangements for the past 2 months: [place]                                      Prior Living Arrangements/Services Living arrangements for the past 2 months: [place] Lives with:: Self Patient language and need for interpreter reviewed:: Yes Do you feel safe going back to the place where you live?: Yes      Need for Family Participation in Patient Care: Yes (Comment) Care giver support system in place?: Yes (comment) Current home services: Other (comment) (none) Criminal Activity/Legal Involvement Pertinent to Current Situation/Hospitalization: No - Comment as needed  Activities of Daily Living Home Assistive Devices/Equipment: None ADL Screening (condition at time of admission) Patient's cognitive ability adequate to safely complete daily activities?: Yes Is the patient deaf or have difficulty hearing?: No Does the patient have difficulty seeing, even when wearing glasses/contacts?: No Does the patient have difficulty concentrating, remembering, or making decisions?: No Patient able to express need for assistance with ADLs?: No Does the patient have difficulty dressing or bathing?: No Independently performs ADLs?: Yes (appropriate for developmental age) Does the patient have difficulty walking or climbing stairs?: No Weakness of Legs: None Weakness of Arms/Hands: None  Permission Sought/Granted Permission sought to share information with : Facility Sport and exercise psychologist, Family Supports Permission granted to share information with : Yes, Verbal Permission Granted  Share Information with NAME: nephews: Will and Owens & Minor granted to share info w AGENCY: [facility]        Emotional Assessment  Appearance:: Appears stated age Attitude/Demeanor/Rapport: Engaged Affect (typically observed): Appropriate, Pleasant Orientation: : Oriented to Self, Oriented to Place, Oriented to  Time, Oriented to Situation Alcohol / Substance Use: Not Applicable Psych Involvement: No (comment)  Admission diagnosis:  Subarachnoid hemorrhage (HCC) [I60.9] SAH (subarachnoid hemorrhage) (HCC) [I60.9] Patient Active Problem List    Diagnosis Date Noted  . SAH (subarachnoid hemorrhage) (HCC) 08/25/2020  . Subarachnoid hemorrhage (HCC) 08/24/2020  . Fall 08/24/2020  . Macrocytic anemia 08/24/2020  . Bilateral leg edema 03/07/2020  . Right shoulder pain 05/24/2017  . Pancytopenia, acquired (HCC) 02/12/2017  . Other constipation 02/12/2017  . Goals of care, counseling/discussion 01/24/2017  . Lymphoma, marginal zone, lymph nodes of multiple sites (HCC) 12/24/2016  . Anemia in neoplastic disease 12/24/2016  . Trigeminal neuralgia 08/13/2016  . Elevated total protein 06/26/2016  . History of B-cell lymphoma 11/02/2015   PCP:  McNeill, Wendy, MD Pharmacy:   [id number] [place] [place] - [address] [place] [address] Phone: [phone] Fax: [phone]     Social Determinants of Health (SDOH) Interventions    Readmission Risk Interventions No flowsheet data found.  

## 2020-08-26 NOTE — Progress Notes (Signed)
PROGRESS NOTE  Joy Lynch FUX:[id number] DOB: 1925/11/30 DOA: 08/24/2020 PCP: Cari Caraway, MD  Brief History   Joy Lynch is a 86 y.o. female with medical history significant of marginal zone lymphoma followed by oncology, migraines, osteoporosis, hypertension, hypothyroidism, trigeminal neuralgia presenting to the ED via EMS from her independent nursing facility after a witnessed fall which happened just prior to arrival.  Patient states she was walking to the dining room at her facility and somehow tripped and fell on her face.  She denies any preceding dizziness, chest pain, or shortness of breath.  She believes she might have lost consciousness after she fell on the ground and hit her face.  Her left lower ribs are sore since the fall but denies any other injuries.  Denies headaches, neck pain, back pain, or pain anywhere else.  She takes aspirin 81 mg daily.  Denies history of seizures.  ED Course: Hemodynamically stable.  WBC 40.0, hemoglobin 11.3, hematocrit 35.1, platelet 168K.  Sodium 138, potassium 4.8, chloride 105, bicarb 24, BUN 20, creatinine 0.7, glucose 93.  LFTs normal.  Screening SARS-CoV-2 PCR test and influenza panel pending.  X-ray of left chest/ribs showing no acute rib fracture.  Head CT showing right frontoparietal subarachnoid hemorrhage.  Associated underlying intraparenchymal hemorrhage not excluded.  Repeat CT scan recommended in 6 hours.  CT maxillofacial showing left maxillary subcutaneous soft tissue hematoma formation but no acute facial fracture.  CT C-spine negative for acute finding.  ED provider discussed the case with on-call neurosurgeon Dr. Marcello Moores who requested admission under medicine service at [facility].  Recommended repeat CT scan in the morning.  Also recommended starting the patient on Keppra 500 mg twice daily x7 days.  Recommended keeping blood pressure well controlled with MAP ranging between 70-90.  Patient was given p.o. Keppra 500 mg in the  ED.  [facility] were consulted to admit the patient for further evaluation and treatment.   Repeat CT brain this morning demonstrated no change over the previous scan.   The patient has been evaluated by PT/OT. They recommend the patient returning to [facility] but in the skilled nursing unit and not the assisted living facility.  Consultants  . Neurosurgery  Procedures  . None  Antibiotics   Anti-infectives (From admission, onward)   None     Subjective  The patient is resting quietly. No new complaints.  Objective   Vitals:  Vitals:   08/26/20 0957 08/26/20 1238  BP:  108/64  Pulse:  93  Resp:  16  Temp:  98 F (36.7 C)  SpO2: 95% 92%   Exam:  Constitutional:  . The patient is awake, alert, and oriented x 3. No acute distress. Respiratory:  . No increased work of breathing. . No wheezes, rales, or rhonchi . No tactile fremitus Cardiovascular:  . Regular rate and rhythm . No murmurs, ectopy, or gallups. . No lateral PMI. No thrills. Abdomen:  . Abdomen is soft, non-tender, non-distended . No hernias, masses, or organomegaly . Normoactive bowel sounds.  Musculoskeletal:  . No cyanosis, clubbing, or edema Skin:  . No rashes, lesions, ulcers . palpation of skin: no induration or nodules . Left side of face with significant ecchymoses Neurologic:  . CN 2-12 intact . Sensation all 4 extremities intact . Movign all extremities Psychiatric:  . Mental status o Mood, affect appropriate o Orientation to person, place, time  . judgment and insight appear intact  I have personally reviewed the following:   Today's Data  .  Vitals  Imaging  . CT head without contrast  Scheduled Meds: . calcium-vitamin D  1 tablet Oral Daily  . gabapentin  800 mg Oral TID  . levETIRAcetam  500 mg Oral BID  . levothyroxine  75 mcg Oral Q0600  . multivitamin with minerals  1 tablet Oral Daily  . propranolol  10 mg Oral Daily   Continuous  Infusions:  Principal Problem:   Subarachnoid hemorrhage (HCC) Active Problems:   Trigeminal neuralgia   Lymphoma, marginal zone, lymph nodes of multiple sites ([place])   Fall   Macrocytic anemia   SAH (subarachnoid hemorrhage) (HCC)   LOS: 1 day   A & P  Subarachnoid hemorrhage/ possible intraparenchymal hemorrhage with an underlying countrecoup contusion: Patient had a witnessed fall prior to ED arrival at her independent living facility.  She takes aspirin 81 mg daily. Head CT showing right frontoparietal subarachnoid hemorrhage.  Associated underlying intraparenchymal hemorrhage not excluded.  ED provider discussed the case with on-call neurosurgeon Dr. Marcello Moores who requested admission under medicine service at [facility].  Recommended repeat CT scan in the morning.  Also recommended starting the patient on Keppra 500 mg twice daily x 7 days.  Recommended keeping blood pressure well controlled with MAP ranging between 70-90. The patient was admitted to progressive care unit at [facility]. Repeat CT head this am was unchanged.  Neuro exam currently nonfocal, continue frequent neurochecks. Neurosurgery has evaluated the patient. He finds that she has mild left sided weakness due to the perirolandic brain contusion. Their recommendations included: PT/OT eval and treat, repeat head CT scan in 2 weeks, hold aspirin for two weeks, patient is to take Keppra 500 mg twice daily for 7 days.  Monitor blood pressure closely with goal MAP ranging between 70-100. No neurosurgical intervention is indicated. Pharmacological DVT prophylaxis may be restarted on 08/26/2020.   Fall: Appears to be mechanical. Fall precautions, PT/OT eval  Marginal zone lymphoma, lymph nodes of multiple sites: The patient is followed by Dr. Marden Noble from oncology and currently not on treatment due to advanced age and frail status.  Labs done today showing WBC 40.0, differential pending.  WBC count elevated at 35.6 on labs done on  06/27/2020 during oncology office visit. She will be asked to follow up with oncology as outpatient.  Macrocytic anemia: Hemoglobin 11.3, hematocrit 35.1, MCV 106.0.  Hemoglobin appears to be at baseline.  Patient is followed by heme-onc. She will be asked to follow up with oncology as outpatient.  Hypertension: Continue home propranolol.  IV hydralazine PRN SBP >160. Monitor blood pressure closely with goal MAP ranging between 70-100 per neurosurgery recommendation.  Trigeminal neuralgia: Noted. Continue home gabapentin  Hypothyroidism: Continue Synthroid as at home.  I have seen and examined this patient myself. I have spent 34 in her evaluation and care.   DVT prophylaxis: SCDs Code Status: Patient wishes to be full code. Family Communication: None available. Disposition Plan:  Status is: Inpatient  Remains inpatient appropriate because:Ongoing diagnostic testing needed not appropriate for outpatient work up  Dispo: The patient is from: Home              Anticipated d/c is to: Home              Anticipated d/c date is: 1 day              Patient currently is not medically stable to d/c.  Lyly Canizales, DO [facility] Direct contact: see [email or web address]  7PM-7AM contact night coverage  as above 08/26/2020, 3:46 PM  LOS: 0 days

## 2020-08-26 NOTE — NC FL2 (Signed)
[place] LEVEL OF CARE SCREENING TOOL     IDENTIFICATION  Patient Name: Joy Lynch Birthdate: May 03, 1926 Sex: female Admission Date (Current Location): 08/24/2020  [place] and [place] Number:  Herbalist and Address:  [facility], [place] [address] [place], [place] [address]      Provider Number: [id number]  Attending Physician Name and Address:  Karie Kirks, DO  Relative Name and Phone Number:  Ellina, Sivertsen   [phone]    Current Level of Care: Hospital Recommended Level of Care: [place] Prior Approval Number:    Date Approved/Denied:   PASRR Number: [id number]  Discharge Plan: SNF    Current Diagnoses: Patient Active Problem List   Diagnosis Date Noted  . SAH (subarachnoid hemorrhage) ([place]) 08/25/2020  . Subarachnoid hemorrhage ([place]) 08/24/2020  . Fall 08/24/2020  . Macrocytic anemia 08/24/2020  . Bilateral leg edema 03/07/2020  . Right shoulder pain 05/24/2017  . Pancytopenia, acquired ([place]) 02/12/2017  . Other constipation 02/12/2017  . Goals of care, counseling/discussion 01/24/2017  . Lymphoma, marginal zone, lymph nodes of multiple sites ([place]) 12/24/2016  . Anemia in neoplastic disease 12/24/2016  . Trigeminal neuralgia 08/13/2016  . Elevated total protein 06/26/2016  . History of B-cell lymphoma 11/02/2015    Orientation RESPIRATION BLADDER Height & Weight     Self, Time, Situation, Place  O2 Continent Weight:   Height:     BEHAVIORAL SYMPTOMS/MOOD NEUROLOGICAL BOWEL NUTRITION STATUS      Continent Diet (Regular diet.  See discharge summary)  AMBULATORY STATUS COMMUNICATION OF NEEDS Skin   Supervision Verbally Normal                       Personal Care Assistance Level of Assistance  Bathing, Feeding, Dressing Bathing Assistance: Independent Feeding assistance: Independent Dressing Assistance: Limited assistance     Functional Limitations Info  Sight, Hearing,  Speech Sight Info: Adequate Hearing Info: Adequate Speech Info: Adequate    SPECIAL CARE FACTORS FREQUENCY  PT (By licensed PT), OT (By licensed OT)     PT Frequency: 5x week OT Frequency: 5x week            Contractures Contractures Info: Not present    Additional Factors Info  Code Status, Allergies Code Status Info: full Allergies Info: Penicillins, Carbamazepine, Pregabalin           Current Medications (08/26/2020):  This is the current hospital active medication list Current Facility-Administered Medications  Medication Dose Route Frequency Provider Last Rate Last Admin  . acetaminophen (TYLENOL) tablet 650 mg  650 mg Oral Q6H PRN Shela Leff, MD       Or  . acetaminophen (TYLENOL) suppository 650 mg  650 mg Rectal Q6H PRN Shela Leff, MD      . calcium-vitamin D (OSCAL WITH D) 500-200 MG-UNIT per tablet 1 tablet  1 tablet Oral Daily Shela Leff, MD   1 tablet at 08/26/20 0955  . gabapentin (NEURONTIN) capsule 800 mg  800 mg Oral TID Shela Leff, MD   800 mg at 08/26/20 0955  . hydrALAZINE (APRESOLINE) injection 5 mg  5 mg Intravenous Q4H PRN Shela Leff, MD      . levETIRAcetam (KEPPRA) tablet 500 mg  500 mg Oral BID Shela Leff, MD   500 mg at 08/26/20 0955  . levothyroxine (SYNTHROID) tablet 75 mcg  75 mcg Oral Q0600 Shela Leff, MD   75 mcg at 08/26/20 0625  . multivitamin  with minerals tablet 1 tablet  1 tablet Oral Daily Shela Leff, MD   1 tablet at 08/26/20 0955  . propranolol (INDERAL) tablet 10 mg  10 mg Oral Daily Shela Leff, MD   10 mg at 08/26/20 5868     Discharge Medications: Please see discharge summary for a list of discharge medications.  Relevant Imaging Results:  Relevant Lab Results:   Additional Information SSN [id number]  Joanne Chars, LCSW

## 2020-08-26 NOTE — Progress Notes (Signed)
Physical Therapy Treatment Patient Details Name: Joy Lynch MRN: [id number] DOB: 11/12/1925 Today's Date: 08/26/2020    History of Present Illness Joy Lynch is a 86 y.o. female with medical history significant of marginal zone lymphoma followed by oncology, migraines, osteoporosis, hypertension, hypothyroidism, trigeminal neuralgia presenting to the ED via EMS from her independent nursing facility after a witnessed fall which happened just prior to arrival.     PT Comments    Patient progressing towards physical therapy goals. Patient required minA for supine>sit and min guard for sit to stand with RW and use of momentum. Patient ambulated 150' with RW and min guard for safety, tends to drift left/right during ambulation. Performed functional exercises seated in recliner focusing on BLE strengthening. Patient continues to present with generalized weakness, impaired balance, decreased activity tolerance. Continue to recommend SNF for ongoing Physical Therapy.       Follow Up Recommendations  SNF [facility]     Equipment Recommendations  Other (comment) (4 wheel Atalie Oros with brakes and seat)    Recommendations for Other Services       Precautions / Restrictions Precautions Precautions: Fall Precaution Comments: monitor O2 Restrictions Weight Bearing Restrictions: No    Mobility  Bed Mobility Overal bed mobility: Needs Assistance Bed Mobility: Supine to Sit     Supine to sit: Min assist;HOB elevated        Transfers Overall transfer level: Needs assistance Equipment used: Rolling Jaxsin Bottomley (2 wheeled) Transfers: Sit to/from Stand Sit to Stand: Min guard         General transfer comment: min guard for safety, use of momentum to achieve standing  Ambulation/Gait Ambulation/Gait assistance: Min guard Gait Distance (Feet): 150 Feet Assistive device: Rolling Maram Bently (2 wheeled) Gait Pattern/deviations: Step-through pattern     General  Gait Details: slow steady pace with occasional drifting left/right   Stairs             Wheelchair Mobility    Modified Rankin (Stroke Patients Only)       Balance Overall balance assessment: Needs assistance Sitting-balance support: No upper extremity supported;Feet supported Sitting balance-Leahy Scale: Fair     Standing balance support: Bilateral upper extremity supported;During functional activity Standing balance-Leahy Scale: Poor                              Cognition Arousal/Alertness: Awake/alert Behavior During Therapy: WFL for tasks assessed/performed Overall Cognitive Status: No family/caregiver present to determine baseline cognitive functioning                                 General Comments: A&Ox4, does not recall how fall happened, but cognition intact in all other areas      Exercises General Exercises - Lower Extremity Long Arc Quad: Both;10 reps;Seated Hip Flexion/Marching: Both;10 reps;Seated    General Comments General comments (skin integrity, edema, etc.): VSS on RA      Pertinent Vitals/Pain Pain Assessment: Faces Faces Pain Scale: Hurts a little bit Pain Location: L side at ribs Pain Descriptors / Indicators: Aching Pain Intervention(s): Monitored during session;Repositioned    Home Living                      Prior Function            PT Goals (current goals can now be found in the care plan  section) Acute Rehab PT Goals Patient Stated Goal: walk better PT Goal Formulation: With patient Time For Goal Achievement: 09/08/20 Potential to Achieve Goals: Good Progress towards PT goals: Progressing toward goals    Frequency    Min 3X/week      PT Plan Current plan remains appropriate    Co-evaluation              AM-PAC PT "6 Clicks" Mobility   Outcome Measure  Help needed turning from your back to your side while in a flat bed without using bedrails?: None Help needed  moving from lying on your back to sitting on the side of a flat bed without using bedrails?: A Little Help needed moving to and from a bed to a chair (including a wheelchair)?: A Little Help needed standing up from a chair using your arms (e.g., wheelchair or bedside chair)?: A Little Help needed to walk in hospital room?: A Little Help needed climbing 3-5 steps with a railing? : A Lot 6 Click Score: 18    End of Session Equipment Utilized During Treatment: Gait belt Activity Tolerance: Patient tolerated treatment well Patient left: in chair;with call bell/phone within reach;with chair alarm set Nurse Communication: Mobility status PT Visit Diagnosis: Other abnormalities of gait and mobility (R26.89);Muscle weakness (generalized) (M62.81)     Time: 8032-1224 PT Time Calculation (min) (ACUTE ONLY): 20 min  Charges:  $Therapeutic Activity: 8-22 mins                     Perrin Maltese, PT, DPT Acute Rehabilitation Services Pager [phone] Office [phone]    Alda Lea 08/26/2020, 3:47 PM

## 2020-08-27 LAB — CBC WITH DIFFERENTIAL/PLATELET
Abs Immature Granulocytes: 0.07 10*3/uL (ref 0.00–0.07)
Basophils Absolute: 0.1 10*3/uL (ref 0.0–0.1)
Basophils Relative: 0 %
Eosinophils Absolute: 0.2 10*3/uL (ref 0.0–0.5)
Eosinophils Relative: 0 %
HCT: 29.8 % — ABNORMAL LOW (ref 36.0–46.0)
Hemoglobin: 9.9 g/dL — ABNORMAL LOW (ref 12.0–15.0)
Immature Granulocytes: 0 %
Lymphocytes Relative: 57 %
Lymphs Abs: 24.4 10*3/uL — ABNORMAL HIGH (ref 0.7–4.0)
MCH: 34.4 pg — ABNORMAL HIGH (ref 26.0–34.0)
MCHC: 33.2 g/dL (ref 30.0–36.0)
MCV: 103.5 fL — ABNORMAL HIGH (ref 80.0–100.0)
Monocytes Absolute: 16.4 10*3/uL — ABNORMAL HIGH (ref 0.1–1.0)
Monocytes Relative: 37 %
Neutro Abs: 2.7 10*3/uL (ref 1.7–7.7)
Neutrophils Relative %: 6 %
Platelets: 138 10*3/uL — ABNORMAL LOW (ref 150–400)
RBC: 2.88 MIL/uL — ABNORMAL LOW (ref 3.87–5.11)
RDW: 15.5 % (ref 11.5–15.5)
WBC Morphology: ABNORMAL
WBC: 43.8 10*3/uL — ABNORMAL HIGH (ref 4.0–10.5)
nRBC: 0 % (ref 0.0–0.2)

## 2020-08-27 LAB — BASIC METABOLIC PANEL
Anion gap: 9 (ref 5–15)
BUN: 16 mg/dL (ref 8–23)
CO2: 26 mmol/L (ref 22–32)
Calcium: 8.2 mg/dL — ABNORMAL LOW (ref 8.9–10.3)
Chloride: 102 mmol/L (ref 98–111)
Creatinine, Ser: 0.88 mg/dL (ref 0.44–1.00)
GFR, Estimated: >60 mL/min (ref >60)
Glucose, Bld: 99 mg/dL (ref 70–99)
Potassium: 3.5 mmol/L (ref 3.5–5.1)
Sodium: 137 mmol/L (ref 135–145)

## 2020-08-27 MED ORDER — SALINE SPRAY 0.65 % NA SOLN
2.0000 | NASAL | Status: DC
  Administered 2020-08-27 – 2020-08-29 (×12): 2 via NASAL
  Filled 2020-08-27: qty 44

## 2020-08-27 NOTE — Progress Notes (Signed)
PROGRESS NOTE  Trinna Kunst CVE:[id number] DOB: August 04, 1926 DOA: 08/24/2020 PCP: Cari Caraway, MD  Brief History   Joy Lynch is a 86 y.o. female with medical history significant of marginal zone lymphoma followed by oncology, migraines, osteoporosis, hypertension, hypothyroidism, trigeminal neuralgia presenting to the ED via EMS from her independent nursing facility after a witnessed fall which happened just prior to arrival.  Patient states she was walking to the dining room at her facility and somehow tripped and fell on her face.  She denies any preceding dizziness, chest pain, or shortness of breath.  She believes she might have lost consciousness after she fell on the ground and hit her face.  Her left lower ribs are sore since the fall but denies any other injuries.  Denies headaches, neck pain, back pain, or pain anywhere else.  She takes aspirin 81 mg daily.  Denies history of seizures.  ED Course: Hemodynamically stable.  WBC 40.0, hemoglobin 11.3, hematocrit 35.1, platelet 168K.  Sodium 138, potassium 4.8, chloride 105, bicarb 24, BUN 20, creatinine 0.7, glucose 93.  LFTs normal.  Screening SARS-CoV-2 PCR test and influenza panel pending.  X-ray of left chest/ribs showing no acute rib fracture.  Head CT showing right frontoparietal subarachnoid hemorrhage.  Associated underlying intraparenchymal hemorrhage not excluded.  Repeat CT scan recommended in 6 hours.  CT maxillofacial showing left maxillary subcutaneous soft tissue hematoma formation but no acute facial fracture.  CT C-spine negative for acute finding.  ED provider discussed the case with on-call neurosurgeon Dr. Marcello Moores who requested admission under medicine service at [facility].  Recommended repeat CT scan in the morning.  Also recommended starting the patient on Keppra 500 mg twice daily x7 days.  Recommended keeping blood pressure well controlled with MAP ranging between 70-90.  Patient was given p.o. Keppra 500 mg in the  ED.  [facility] were consulted to admit the patient for further evaluation and treatment.   Repeat CT brain this morning demonstrated no change over the previous scan.   The patient has been evaluated by PT/OT. They recommend the patient returning to [facility] but in the skilled nursing unit and not the assisted living facility.  Consultants  . Neurosurgery  Procedures  . None  Antibiotics   Anti-infectives (From admission, onward)   None     Subjective  The patient is resting quietly. No new complaints.  Objective   Vitals:  Vitals:   08/27/20 1232 08/27/20 1300  BP: 110/61 (!) 101/57  Pulse: 87   Resp: (!) 23 19  Temp: 98.5 F (36.9 C)   SpO2:     Exam:  Constitutional:  . The patient is awake, alert, and oriented x 3. No acute distress. Respiratory:  . No increased work of breathing. . No wheezes, rales, or rhonchi . No tactile fremitus Cardiovascular:  . Regular rate and rhythm . No murmurs, ectopy, or gallups. . No lateral PMI. No thrills. Abdomen:  . Abdomen is soft, non-tender, non-distended . No hernias, masses, or organomegaly . Normoactive bowel sounds.  Musculoskeletal:  . No cyanosis, clubbing, or edema Skin:  . No rashes, lesions, ulcers . palpation of skin: no induration or nodules . Left side of face with significant ecchymoses Neurologic:  . CN 2-12 intact . Sensation all 4 extremities intact . Movign all extremities Psychiatric:  . Mental status o Mood, affect appropriate o Orientation to person, place, time  . judgment and insight appear intact  I have personally reviewed the following:   Today's  Data  . Vitals, CBC, BMP  Imaging  . CT head without contrast  Scheduled Meds: . calcium-vitamin D  1 tablet Oral Daily  . gabapentin  800 mg Oral TID  . levETIRAcetam  500 mg Oral BID  . levothyroxine  75 mcg Oral Q0600  . multivitamin with minerals  1 tablet Oral Daily  . propranolol  10 mg Oral Daily  . sodium  chloride  2 spray Each Nare Q4H   Continuous Infusions:  Principal Problem:   Subarachnoid hemorrhage ([facility]) Active Problems:   Trigeminal neuralgia   Lymphoma, marginal zone, lymph nodes of multiple sites ([place])   Fall   Macrocytic anemia   SAH (subarachnoid hemorrhage) ([facility])   LOS: 2 days   A & P  Subarachnoid hemorrhage/ possible intraparenchymal hemorrhage with an underlying countrecoup contusion: Patient had a witnessed fall prior to ED arrival at her independent living facility.  She takes aspirin 81 mg daily. Head CT showing right frontoparietal subarachnoid hemorrhage.  Associated underlying intraparenchymal hemorrhage not excluded.  ED provider discussed the case with on-call neurosurgeon Dr. Marcello Moores who requested admission under medicine service at [facility].  Recommended repeat CT scan in the morning.  Also recommended starting the patient on Keppra 500 mg twice daily x 7 days.  Recommended keeping blood pressure well controlled with MAP ranging between 70-90. The patient was admitted to progressive care [facility] at [facility]. Repeat CT head this am was unchanged.  Neuro exam currently nonfocal, continue frequent neurochecks. Neurosurgery has evaluated the patient. He finds that she has mild left sided weakness due to the perirolandic brain contusion. Their recommendations included: PT/OT eval and treat, repeat head CT scan in 2 weeks, hold aspirin for two weeks, patient is to take Keppra 500 mg twice daily for 7 days.  Monitor blood pressure closely with goal MAP ranging between 70-100. No neurosurgical intervention is indicated. Pharmacological DVT prophylaxis may be restarted on 08/26/2020.   Fall: Appears to be mechanical. Fall precautions, PT/OT eval  Marginal zone lymphoma, lymph nodes of multiple sites: The patient is followed by Dr. Marden Noble from oncology and currently not on treatment due to advanced age and frail status.  Labs done today showing WBC 40.0, differential pending.   WBC count elevated at 35.6 on labs done on 06/27/2020 during oncology office visit. She will be asked to follow up with oncology as outpatient.  Macrocytic anemia: Hemoglobin 11.3, hematocrit 35.1, MCV 106.0.  Hemoglobin appears to be at baseline.  Patient is followed by heme-onc. She will be asked to follow up with oncology as outpatient.  Hypertension: Continue home propranolol.  IV hydralazine PRN SBP >160. Monitor blood pressure closely with goal MAP ranging between 70-100 per neurosurgery recommendation.  Trigeminal neuralgia: Noted. Continue home gabapentin  Hypothyroidism: Continue Synthroid as at home.  I have seen and examined this patient myself. I have spent 34 in her evaluation and care.   DVT prophylaxis: SCDs Code Status: Patient wishes to be full code. Family Communication: None available. Disposition Plan:  Status is: Inpatient  Remains inpatient appropriate because:Ongoing diagnostic testing needed not appropriate for outpatient work up  Dispo: The patient is from: Home              Anticipated d/c is to: Home              Anticipated d/c date is: 1 day              Patient currently is not medically stable to d/c.  Rohen Kimes, DO [facility] Direct contact: see [email or web address]  7PM-7AM contact night coverage as above 08/27/2020, 3:06 PM  LOS: 0 days

## 2020-08-27 NOTE — Progress Notes (Signed)
2330 Pt transferred to 5W as ordered with all personal belongings, report was called to [facility] RN prior to transfer. Pt remains AOx4 and verbally responsive with no c/o pain.

## 2020-08-28 DIAGNOSIS — L8990 Pressure ulcer of unspecified site, unspecified stage: Secondary | ICD-10-CM | POA: Insufficient documentation

## 2020-08-28 DIAGNOSIS — L8991 Pressure ulcer of unspecified site, stage 1: Secondary | ICD-10-CM | POA: Insufficient documentation

## 2020-08-28 NOTE — Progress Notes (Signed)
PROGRESS NOTE  Joy Lynch NOM:[id number] DOB: 09/01/1926 DOA: 08/24/2020 PCP: Cari Caraway, MD  Brief History   Joy Lynch is a 86 y.o. female with medical history significant of marginal zone lymphoma followed by oncology, migraines, osteoporosis, hypertension, hypothyroidism, trigeminal neuralgia presenting to the ED via EMS from her independent nursing facility after a witnessed fall which happened just prior to arrival.  Patient states she was walking to the dining room at her facility and somehow tripped and fell on her face.  She denies any preceding dizziness, chest pain, or shortness of breath.  She believes she might have lost consciousness after she fell on the ground and hit her face.  Her left lower ribs are sore since the fall but denies any other injuries.  Denies headaches, neck pain, back pain, or pain anywhere else.  She takes aspirin 81 mg daily.  Denies history of seizures.  ED Course: Hemodynamically stable.  WBC 40.0, hemoglobin 11.3, hematocrit 35.1, platelet 168K.  Sodium 138, potassium 4.8, chloride 105, bicarb 24, BUN 20, creatinine 0.7, glucose 93.  LFTs normal.  Screening SARS-CoV-2 PCR test and influenza panel pending.  X-ray of left chest/ribs showing no acute rib fracture.  Head CT showing right frontoparietal subarachnoid hemorrhage.  Associated underlying intraparenchymal hemorrhage not excluded.  Repeat CT scan recommended in 6 hours.  CT maxillofacial showing left maxillary subcutaneous soft tissue hematoma formation but no acute facial fracture.  CT C-spine negative for acute finding.  ED provider discussed the case with on-call neurosurgeon Dr. Marcello Moores who requested admission under medicine service at [facility].  Recommended repeat CT scan in the morning.  Also recommended starting the patient on Keppra 500 mg twice daily x7 days.  Recommended keeping blood pressure well controlled with MAP ranging between 70-90.  Patient was given p.o. Keppra 500 mg in the  ED.  [facility] were consulted to admit the patient for further evaluation and treatment.   Repeat CT brain this morning demonstrated no change over the previous scan.   On 08/27/2020 nursing advised me of the presence of an abnormality on the patient's labia majora. I have examing this and there are two bartholin gland cysts on on each labia. The one on the left has a cone dome. The one on the right is considerably less prominent. Neither is tender. No intervention is required.  The patient has been evaluated by PT/OT. They recommend the patient returning to [facility] but in the skilled nursing unit and not the assisted living facility.  Consultants  . Neurosurgery  Procedures  . None  Antibiotics   Anti-infectives (From admission, onward)   None     Subjective  The patient is resting quietly. No new complaints.  Objective   Vitals:  Vitals:   08/28/20 1227 08/28/20 1556  BP: 109/61 111/60  Pulse: 83 90  Resp: 18 18  Temp: 97.7 F (36.5 C) 97.9 F (36.6 C)  SpO2: 93% (!) 89%   Exam:  Constitutional:  . The patient is awake, alert, and oriented x 3. No acute distress. Respiratory:  . No increased work of breathing. . No wheezes, rales, or rhonchi . No tactile fremitus Cardiovascular:  . Regular rate and rhythm . No murmurs, ectopy, or gallups. . No lateral PMI. No thrills. Abdomen:  . Abdomen is soft, non-tender, non-distended . No hernias, masses, or organomegaly . Normoactive bowel sounds.  Musculoskeletal:  . No cyanosis, clubbing, or edema Skin:  . No rashes, lesions, ulcers . palpation of skin: no  induration or nodules . Left side of face with significant ecchymoses  Genitourinary: On 08/27/2020 nursing advised me of the presence of an abnormality on the patient's labia majora. I have examing this and there are two bartholin gland cysts on on each labia. The one on the left has a cone dome. The one on the right is considerably less  prominent. Neither is tender. No intervention is required.  Neurologic:  . CN 2-12 intact . Sensation all 4 extremities intact . Movign all extremities Psychiatric:  . Mental status o Mood, affect appropriate o Orientation to person, place, time  . judgment and insight appear intact  I have personally reviewed the following:   Today's Data  . Vitals  Imaging  . CT head without contrast  Scheduled Meds: . calcium-vitamin D  1 tablet Oral Daily  . gabapentin  800 mg Oral TID  . levETIRAcetam  500 mg Oral BID  . levothyroxine  75 mcg Oral Q0600  . multivitamin with minerals  1 tablet Oral Daily  . propranolol  10 mg Oral Daily  . sodium chloride  2 spray Each Nare Q4H   Continuous Infusions:  Principal Problem:   Subarachnoid hemorrhage (HCC) Active Problems:   Trigeminal neuralgia   Lymphoma, marginal zone, lymph nodes of multiple sites (HCC)   Fall   Macrocytic anemia   SAH (subarachnoid hemorrhage) (HCC)   Pressure injury of skin   LOS: 3 days   A & P  Subarachnoid hemorrhage/ possible intraparenchymal hemorrhage with an underlying countrecoup contusion: Patient had a witnessed fall prior to ED arrival at her independent living facility.  She takes aspirin 81 mg daily. Head CT showing right frontoparietal subarachnoid hemorrhage.  Associated underlying intraparenchymal hemorrhage not excluded.  ED provider discussed the case with on-call neurosurgeon Dr. Marcello Moores who requested admission under medicine service at [facility].  Recommended repeat CT scan in the morning.  Also recommended starting the patient on Keppra 500 mg twice daily x 7 days.  Recommended keeping blood pressure well controlled with MAP ranging between 70-90. The patient was admitted to progressive care unit at [facility]. Repeat CT head this am was unchanged.  Neuro exam currently nonfocal, continue frequent neurochecks. Neurosurgery has evaluated the patient. He finds that she has mild left sided weakness  due to the perirolandic brain contusion. Their recommendations included: PT/OT eval and treat, repeat head CT scan in 2 weeks, hold aspirin for two weeks, patient is to take Keppra 500 mg twice daily for 7 days.  Monitor blood pressure closely with goal MAP ranging between 70-100. No neurosurgical intervention is indicated. Pharmacological DVT prophylaxis may be restarted on 08/26/2020.   Fall: Appears to be mechanical. Fall precautions, PT/OT eval  Marginal zone lymphoma, lymph nodes of multiple sites: The patient is followed by Dr. Marden Noble from oncology and currently not on treatment due to advanced age and frail status.  Labs done today showing WBC 40.0, differential pending.  WBC count elevated at 35.6 on labs done on 06/27/2020 during oncology office visit. She will be asked to follow up with oncology as outpatient.  Macrocytic anemia: Hemoglobin 11.3, hematocrit 35.1, MCV 106.0.  Hemoglobin appears to be at baseline.  Patient is followed by heme-onc. She will be asked to follow up with oncology as outpatient.  Hypertension: Continue home propranolol.  IV hydralazine PRN SBP >160. Monitor blood pressure closely with goal MAP ranging between 70-100 per neurosurgery recommendation.  Bartholin Gland Cysts: On 08/27/2020 nursing advised me of the presence  of an abnormality on the patient's labia majora. I have examing this and there are two bartholin gland cysts on on each labia. The one on the left has a cone dome. The one on the right is considerably less prominent. Neither is tender. No intervention is required.  Stage 1 pressure injury of the left medial buttock: Wound care.  Trigeminal neuralgia: Noted. Continue home gabapentin  Hypothyroidism: Continue Synthroid as at home.  I have seen and examined this patient myself. I have spent 30 in her evaluation and care.   DVT prophylaxis: SCDs Code Status: Patient wishes to be full code. Family Communication: None available. Disposition  Plan:  Status is: Inpatient  Remains inpatient appropriate because:Ongoing diagnostic testing needed not appropriate for outpatient work up  Dispo: The patient is from: Home              Anticipated d/c is to: Home              Anticipated d/c date is: 1 day              Patient currently is not medically stable to d/c.  Kamaria Lucia, DO [facility] Direct contact: see [email or web address]  7PM-7AM contact night coverage as above 08/28/2020, 4:04 PM  LOS: 0 days

## 2020-08-28 NOTE — Plan of Care (Signed)
  Problem: Education: Goal: Knowledge of General Education information will improve Description: Including pain rating scale, medication(s)/side effects and non-pharmacologic comfort measures Outcome: Progressing   Problem: Coping: Goal: Level of anxiety will decrease Outcome: Progressing   Problem: Safety: Goal: Ability to remain free from injury will improve Outcome: Progressing   

## 2020-08-28 NOTE — Progress Notes (Signed)
Transfer Note: ? Arrival Method: Bed Mental Orientation: AXOX4 Telemetry: No Assessment: Completed Skin: Refer to flowsheet IV: Right hand  Pain: 0/10 Tubes: None Safety Measures: Safety Fall Prevention Plan discussed with patient. Admission: Completed [facility] [place] Orientation: Patient has been orientated to the room, unit and the staff. Family: None Orders have been reviewed and are being implemented. Will continue to monitor the patient. Call light has been placed within reach and bed alarm has been activated.  ? Milagros Loll, RN  Phone Number: [phone]

## 2020-08-29 LAB — SARS CORONAVIRUS 2 BY RT PCR (HOSPITAL ORDER, PERFORMED IN ~~LOC~~ HOSPITAL LAB): SARS Coronavirus 2: NEGATIVE

## 2020-08-29 MED ORDER — SODIUM CHLORIDE 0.9 % IV BOLUS: 250.0000 mL | Freq: Once | INTRAVENOUS | Status: DC

## 2020-08-29 MED ORDER — LEVETIRACETAM 500 MG PO TABS: 500.0000 mg | ORAL_TABLET | Freq: Two times a day (BID) | ORAL | 0 refills | Status: DC

## 2020-08-29 MED ORDER — ASPIRIN EC 81 MG PO TBEC: 81.0000 mg | DELAYED_RELEASE_TABLET | Freq: Every day | ORAL | 0 refills | Status: DC

## 2020-08-29 MED ORDER — HYPROMELLOSE (GONIOSCOPIC) 2.5 % OP SOLN: 1.0000 [drp] | Freq: Four times a day (QID) | OPHTHALMIC | 12 refills | Status: DC | PRN

## 2020-08-29 MED ORDER — SALINE SPRAY 0.65 % NA SOLN
2.0000 | NASAL | 0 refills | Status: DC
Start: 2020-08-29 — End: 2020-11-10

## 2020-08-29 NOTE — Progress Notes (Signed)
When transport arrived the patients blood pressure was 94/47,  We took it again and it went up to 92/51, we waited 15 minutes and retook it and it was 97/51. I called the [facility] and they said they would take her as long as she isn't symptomatic.  She was not.  Called DR Gertie Fey and she said she is ok to go.

## 2020-08-29 NOTE — Care Management Important Message (Signed)
Important Message  Patient Details  Name: Joy Lynch MRN: [id number] Date of Birth: 1926/02/23   Medicare Important Message Given:  Yes     [name] [name] 08/29/2020, 3:29 PM

## 2020-08-29 NOTE — Plan of Care (Signed)
  Problem: Education: Goal: Knowledge of General Education information will improve Description: Including pain rating scale, medication(s)/side effects and non-pharmacologic comfort measures Outcome: Adequate for Discharge   Problem: Health Behavior/Discharge Planning: Goal: Ability to manage health-related needs will improve Outcome: Adequate for Discharge   Problem: Clinical Measurements: Goal: Ability to maintain clinical measurements within normal limits will improve Outcome: Adequate for Discharge Goal: Will remain free from infection Outcome: Adequate for Discharge Goal: Diagnostic test results will improve Outcome: Adequate for Discharge Goal: Respiratory complications will improve Outcome: Adequate for Discharge Goal: Cardiovascular complication will be avoided Outcome: Adequate for Discharge   Problem: Activity: Goal: Risk for activity intolerance will decrease Outcome: Adequate for Discharge   Problem: Nutrition: Goal: Adequate nutrition will be maintained Outcome: Adequate for Discharge   Problem: Coping: Goal: Level of anxiety will decrease Outcome: Adequate for Discharge   Problem: Elimination: Goal: Will not experience complications related to bowel motility Outcome: Adequate for Discharge Goal: Will not experience complications related to urinary retention Outcome: Adequate for Discharge   Problem: Pain Managment: Goal: General experience of comfort will improve Outcome: Adequate for Discharge   Problem: Safety: Goal: Ability to remain free from injury will improve Outcome: Adequate for Discharge   Problem: Skin Integrity: Goal: Risk for impaired skin integrity will decrease Outcome: Adequate for Discharge   Problem: Education: Goal: Knowledge of the prescribed therapeutic regimen Outcome: Adequate for Discharge Goal: Knowledge of disease or condition will improve Outcome: Adequate for Discharge   Problem: Clinical Measurements: Goal:  Neurologic status will improve Outcome: Adequate for Discharge   Problem: Tissue Perfusion: Goal: Ability to maintain intracranial pressure will improve Outcome: Adequate for Discharge   Problem: Respiratory: Goal: Will regain and/or maintain adequate ventilation Outcome: Adequate for Discharge   Problem: Skin Integrity: Goal: Risk for impaired skin integrity will decrease Outcome: Adequate for Discharge Goal: Demonstration of wound healing without infection will improve Outcome: Adequate for Discharge   Problem: Psychosocial: Goal: Ability to verbalize positive feelings about self will improve Outcome: Adequate for Discharge Goal: Ability to participate in self-care as condition permits will improve Outcome: Adequate for Discharge Goal: Ability to identify appropriate support needs will improve Outcome: Adequate for Discharge   Problem: Health Behavior/Discharge Planning: Goal: Ability to manage health-related needs will improve Outcome: Adequate for Discharge   Problem: Nutritional: Goal: Risk of aspiration will decrease Outcome: Adequate for Discharge   Problem: Communication: Goal: Ability to communicate needs accurately will improve Outcome: Adequate for Discharge

## 2020-08-29 NOTE — Progress Notes (Signed)
Physical Therapy Treatment Patient Details Name: Joy Lynch MRN: [id number] DOB: 09-05-1926 Today's Date: 08/29/2020    History of Present Illness Joy Lynch is a 86 y.o. female with medical history significant of marginal zone lymphoma followed by oncology, migraines, osteoporosis, hypertension, hypothyroidism, trigeminal neuralgia presenting to the ED via EMS from her independent nursing facility after a witnessed fall which happened just prior to arrival.     PT Comments    Pt progressing towards physical therapy goals. Focus of session was ambulation to the bathroom for a bowel movement and hand hygiene at the sink after. Pt with continued L flank pain and was positioned on 2 folded bed pads under L ribs for comfort at end of session. Pt anticipates d/c to SNF this afternoon. Will continue to follow.    Follow Up Recommendations  [facility]     Equipment Recommendations  Other (comment) (4 wheel walker with brakes and seat)    Recommendations for Other Services       Precautions / Restrictions Precautions Precautions: Fall Precaution Comments: monitor O2 Restrictions Weight Bearing Restrictions: No    Mobility  Bed Mobility Overal bed mobility: Needs Assistance Bed Mobility: Supine to Sit     Supine to sit: Min assist;HOB elevated     General bed mobility comments: Utilizing railing well. Assist provided for trunk elevation to full sitting position. Bed pad used for scooting towards EOB.   Transfers Overall transfer level: Needs assistance Equipment used: Rolling walker (2 wheeled) Transfers: Sit to/from Stand Sit to Stand: Min guard         General transfer comment: Pt demonstrated proper hand placement on seated surface for safety. No assist provided for full stand however hands on guarding provided for safety. Increased time required.   Ambulation/Gait Ambulation/Gait assistance: Min guard Gait Distance (Feet): 25  Feet Assistive device: Rolling walker (2 wheeled) Gait Pattern/deviations: Step-through pattern;Trunk flexed;Narrow base of support Gait velocity: Decreased Gait velocity interpretation: <1.31 ft/sec, indicative of household ambulator General Gait Details: Trunk very flexed but able to make some corrective changes with VC's. In room only.    Stairs             Wheelchair Mobility    Modified Rankin (Stroke Patients Only) Modified Rankin (Stroke Patients Only) Pre-Morbid Rankin Score: No symptoms Modified Rankin: Moderately severe disability     Balance Overall balance assessment: Needs assistance Sitting-balance support: No upper extremity supported;Feet supported Sitting balance-Leahy Scale: Fair   Postural control: Posterior lean Standing balance support: Bilateral upper extremity supported;During functional activity Standing balance-Leahy Scale: Poor                              Cognition Arousal/Alertness: Awake/alert Behavior During Therapy: WFL for tasks assessed/performed Overall Cognitive Status: No family/caregiver present to determine baseline cognitive functioning                                 General Comments: A&Ox4, does not recall how fall happened, but cognition intact in all other areas      Exercises      General Comments General comments (skin integrity, edema, etc.): VSS on RA. 94% after session and left on RA per RN.       Pertinent Vitals/Pain Pain Assessment: Faces Faces Pain Scale: Hurts even more Pain Location: L side at ribs occasionally during movement.  Pain Descriptors / Indicators: Sharp Pain Intervention(s): Limited activity within patient's tolerance;Monitored during session;Repositioned    Home Living                      Prior Function            PT Goals (current goals can now be found in the care plan section) Acute Rehab PT Goals Patient Stated Goal: walk better PT Goal  Formulation: With patient Time For Goal Achievement: 09/08/20 Potential to Achieve Goals: Good Progress towards PT goals: Progressing toward goals    Frequency    Min 3X/week      PT Plan Current plan remains appropriate    Co-evaluation              AM-PAC PT "6 Clicks" Mobility   Outcome Measure  Help needed turning from your back to your side while in a flat bed without using bedrails?: None Help needed moving from lying on your back to sitting on the side of a flat bed without using bedrails?: A Little Help needed moving to and from a bed to a chair (including a wheelchair)?: A Little Help needed standing up from a chair using your arms (e.g., wheelchair or bedside chair)?: A Little Help needed to walk in hospital room?: A Little Help needed climbing 3-5 steps with a railing? : A Lot 6 Click Score: 18    End of Session Equipment Utilized During Treatment: Gait belt Activity Tolerance: Patient tolerated treatment well   Nurse Communication: Mobility status PT Visit Diagnosis: Other abnormalities of gait and mobility (R26.89);Muscle weakness (generalized) (M62.81)     Time: 1333-1410 PT Time Calculation (min) (ACUTE ONLY): 37 min  Charges:  $Gait Training: 23-37 mins                     Rolinda Roan, PT, DPT Acute Rehabilitation Services Pager: [phone] Office: [phone]    Thelma Comp 08/29/2020, 2:51 PM

## 2020-08-29 NOTE — Discharge Summary (Signed)
Physician Discharge Summary  Marvelyn Bouchillon FIE:[id number] DOB: 04-11-1926 DOA: 08/24/2020  PCP: Cari Caraway, MD  Admit date: 08/24/2020 Discharge date: 08/29/2020  Recommendations for Outpatient Follow-up:  1. Discharge to SNF for PT/OT/rehab 2. Follow up with PCP in 7-10 days. Have chemistry checked on that date. Make appointment. 3. Follow up with Neurosurgery in 2 weeks as directed. Make appointment.   Follow-up Information    Vallarie Mare, MD Follow up in 2 week(s).   Specialty: Neurosurgery Contact information: [address] [place] [address] [phone]              Discharge Diagnoses: Principal diagnosis is #1 1. Subarachnoid hemorrhage 2. Intraparenchymal hemorrhage 3. Marginal zone Lymphoma 4. Hyponatremia 5. Hypovolemia 6. Hypothyroidism 7. Chronic left cerebellar infarct 8. Ambulatory dysfunction 9. Debility  Discharge Condition: Fair  Disposition: SNF  Diet recommendation: Heart healthy  Filed Weights   08/27/20 2357 08/29/20 1100  Weight: 71.2 kg 58.6 kg    History of present illness: Joy Lynch is a 86 y.o. female with medical history significant of marginal zone lymphoma followed by oncology, migraines, osteoporosis, hypertension, hypothyroidism, trigeminal neuralgia presenting to the ED via EMS from her independent nursing facility after a witnessed fall which happened just prior to arrival.  Patient states she was walking to the dining room at her facility and somehow tripped and fell on her face.  She denies any preceding dizziness, chest pain, or shortness of breath.  She believes she might have lost consciousness after she fell on the ground and hit her face.  Her left lower ribs are sore since the fall but denies any other injuries.  Denies headaches, neck pain, back pain, or pain anywhere else.  She takes aspirin 81 mg daily.  Denies history of seizures.  ED Course: Hemodynamically stable.  WBC 40.0, hemoglobin  11.3, hematocrit 35.1, platelet 168K.  Sodium 138, potassium 4.8, chloride 105, bicarb 24, BUN 20, creatinine 0.7, glucose 93.  LFTs normal.  Screening SARS-CoV-2 PCR test and influenza panel pending.  X-ray of left chest/ribs showing no acute rib fracture.  Head CT showing right frontoparietal subarachnoid hemorrhage.  Associated underlying intraparenchymal hemorrhage not excluded.  Repeat CT scan recommended in 6 hours.  CT maxillofacial showing left maxillary subcutaneous soft tissue hematoma formation but no acute facial fracture.  CT C-spine negative for acute finding.  ED provider discussed the case with on-call neurosurgeon Dr. Marcello Moores who requested admission under medicine service at [facility].  Recommended repeat CT scan in the morning.  Also recommended starting the patient on Keppra 500 mg twice daily x7 days.  Recommended keeping blood pressure well controlled with MAP ranging between 70-90.  Hospital Course: [facility] were consulted to admit the patient for further evaluation and treatment.   Repeat CT brain this morning demonstrated no change over the previous scan.   Neurosurgery consulted on the patient and determined that no surgical intervention was required. They have recommending ASA after two weeks and Stopping keppra after two weeks. They will see the patient as outpatient in two weeks.  On 08/27/2020 nursing advised me of the presence of an abnormality on the patient's labia majora. I have examing this and there are two bartholin gland cysts on on each labia. The one on the left has a cone dome. The one on the right is considerably less prominent. Neither is tender. No intervention is required.   The patient has been evaluated by PT/OT. They recommend the patient returning to  [facility] but in the skilled nursing unit and not the assisted living facility.  Today's assessment: S: The patient is resting comfortably in bed. No new complaints. O: Vitals:  Vitals:     08/29/20 0600 08/29/20 0906  BP:  (!) 111/59  Pulse:  97  Resp:  18  Temp:  97.8 F (36.6 C)  SpO2: 93% 90%   Exam:  Constitutional:  . The patient is awake, alert, and oriented x 3. No acute distress. Respiratory:  . No increased work of breathing. . No wheezes, rales, or rhonchi . No tactile fremitus Cardiovascular:  . Regular rate and rhythm . No murmurs, ectopy, or gallups. . No lateral PMI. No thrills. Abdomen:  . Abdomen is soft, non-tender, non-distended . No hernias, masses, or organomegaly . Normoactive bowel sounds.  Musculoskeletal:  . No cyanosis, clubbing, or edema Skin:  . No rashes, lesions, ulcers . palpation of skin: no induration or nodules Neurologic:  . CN 2-12 intact . Sensation all 4 extremities intact Psychiatric:  . Mental status o Mood, affect appropriate o Orientation to person, place, time  . judgment and insight appear intact   Discharge Instructions  Discharge Instructions    Activity as tolerated - No restrictions   Complete by: As directed    Call MD for:   Complete by: As directed    Neurological changes.   Call MD for:  persistant nausea and vomiting   Complete by: As directed    Call MD for:  severe uncontrolled pain   Complete by: As directed    Diet - low sodium heart healthy   Complete by: As directed    Discharge instructions   Complete by: As directed    Discharge to SNF for PT/OT/rehab Follow up with PCP in 7-10 days. Have chemistry checked on that date. Make appointment. Follow up with Neurosurgery in 2 weeks as directed. Make appointment.   Increase activity slowly   Complete by: As directed    No wound care   Complete by: As directed      Allergies as of 08/29/2020      Reactions   Penicillins Hives   Carbamazepine Other (See Comments), Hives   Turned purple from neck down and drowsy Other reaction(s): Other (See Comments) Turned purple from neck down and drowsy   Pregabalin Other (See Comments)    Drowsy Other reaction(s): Confusion (intolerance) Drowsy      Medication List    STOP taking these medications   aspirin 81 MG tablet Replaced by: aspirin EC 81 MG tablet     TAKE these medications   aspirin EC 81 MG tablet Take 1 tablet (81 mg total) by mouth daily. Swallow whole. Start taking on: September 09, 2020 Replaces: aspirin 81 MG tablet   Biotin 10 MG Caps Take 10 mg by mouth daily.   calcium-vitamin D 500-200 MG-UNIT tablet Commonly known as: OSCAL WITH D Take 1 tablet by mouth daily.   gabapentin 800 MG tablet Commonly known as: NEURONTIN Take 800 mg by mouth 3 (three) times daily.   hydroxypropyl methylcellulose / hypromellose 2.5 % ophthalmic solution Commonly known as: ISOPTO TEARS / GONIOVISC Place 1 drop into both eyes 4 (four) times daily as needed for dry eyes.   levETIRAcetam 500 MG tablet Commonly known as: KEPPRA Take 1 tablet (500 mg total) by mouth 2 (two) times daily for 10 days.   levothyroxine 75 MCG tablet Commonly known as: SYNTHROID Take 75 mcg by mouth daily before breakfast.  Multi-Vitamins Tabs Take 1 tablet by mouth daily.   propranolol 10 MG tablet Commonly known as: INDERAL Take 10 mg by mouth daily.   sodium chloride 0.65 % Soln nasal spray Commonly known as: OCEAN Place 2 sprays into both nostrils every 4 (four) hours.            Durable Medical Equipment  (From admission, onward)         Start     Ordered   08/29/20 1046  DME Walker  Once       Question Answer Comment  Walker: Other   Comments 4 wheeled walker with seat and brakes. "Rollator".   Patient needs a walker to treat with the following condition Ambulatory dysfunction      08/29/20 1047         Allergies  Allergen Reactions  . Penicillins Hives  . Carbamazepine Other (See Comments) and Hives    Turned purple from neck down and drowsy Other reaction(s): Other (See Comments) Turned purple from neck down and drowsy  . Pregabalin Other (See  Comments)    Drowsy Other reaction(s): Confusion (intolerance) Drowsy    The results of significant diagnostics from this hospitalization (including imaging, microbiology, ancillary and laboratory) are listed below for reference.    Significant Diagnostic Studies: DG Ribs Unilateral W/Chest Left  Result Date: 08/24/2020 CLINICAL DATA:  Status post fall. EXAM: LEFT RIBS AND CHEST - 3+ VIEW COMPARISON:  December 28, 2017 FINDINGS: A radiopaque marker was placed at the site of the patient's pain. No fracture or other bone lesions are seen involving the ribs. An intact right shoulder replacement is noted. Multilevel degenerative changes seen throughout the thoracic spine. There is no evidence of pneumothorax or pleural effusion. Both lungs are clear. Heart size and mediastinal contours are within normal limits. IMPRESSION: 1. No acute rib fracture. Electronically Signed   By: Virgina Norfolk M.D.   On: 08/24/2020 19:51   CT HEAD WO CONTRAST  Result Date: 08/25/2020 CLINICAL DATA:  Follow-up subarachnoid hemorrhage. EXAM: CT HEAD WITHOUT CONTRAST TECHNIQUE: Contiguous axial images were obtained from the base of the skull through the vertex without intravenous contrast. COMPARISON:  08/24/2020 FINDINGS: Brain: Focal acute right frontoparietal subarachnoid hemorrhage with possible adjacent surface parenchymal hemorrhage. No change in appearance or distribution since the previous study. No evidence of significant progression. Unchanged cerebral atrophy and small vessel ischemic change. No mass effect or midline shift. No significant ventricular dilatation. Vascular: Moderate intracranial arterial vascular calcifications. Skull: Calvarium appears intact. Sinuses/Orbits: Paranasal sinuses and mastoid air cells are clear. Other: None. IMPRESSION: 1. Focal acute right frontoparietal subarachnoid hemorrhage with possible adjacent surface parenchymal hemorrhage. No progression or developing complications since  previous study. 2. Unchanged cerebral atrophy and small vessel ischemic change. Electronically Signed   By: Lucienne Capers M.D.   On: 08/25/2020 01:19   CT Head Wo Contrast  Result Date: 08/24/2020 CLINICAL DATA:  Status post fall 1 getting female. Possible loss of consciousness. Facial swelling left cheek. Abrasion. EXAM: CT HEAD WITHOUT CONTRAST CT MAXILLOFACIAL WITHOUT CONTRAST CT CERVICAL SPINE WITHOUT CONTRAST TECHNIQUE: Multidetector CT imaging of the head, cervical spine, and maxillofacial structures were performed using the standard protocol without intravenous contrast. Multiplanar CT image reconstructions of the cervical spine and maxillofacial structures were also generated. COMPARISON:  MRA head 12/12/2017 FINDINGS: CT HEAD FINDINGS Brain: Cerebral ventricle sizes are concordant with the degree of cerebral volume loss. Loss Patchy and confluent areas of decreased attenuation are noted throughout the deep and  periventricular white matter of the cerebral hemispheres bilaterally, compatible with chronic microvascular ischemic disease. No evidence of large-territorial acute infarction. No definite parenchymal hemorrhage. No mass lesion. Small moderate volume right frontoparietal sub arachnoid hemorrhage. No mass effect or midline shift. No hydrocephalus. No intraventricular extension of the hemorrhage. Basilar cisterns are patent. Vascular: No hyperdense vessel. Skull: No acute fracture or focal lesion. Other: None. CT MAXILLOFACIAL FINDINGS Osseous: No acute displaced fracture. Sinuses/Orbits: Paranasal sinuses and mastoid air cells are clear. Bilateral lens replacement. Otherwise the orbits are unremarkable. Soft tissues: Left maxillary subcutaneus soft tissue edema with associated 2 x 1 cm sub cutaneous hematoma formation. CT CERVICAL SPINE FINDINGS Alignment: Straightening of the normal cervical lordosis likely due to positioning. Grade 1 anterolisthesis of L3 on L4 and L4 on L5. Skull base and  vertebrae: Severe multilevel degenerative changes of the cervical spine with multilevel intervertebral disc space narrowing which is most prominent at the C6-C7 levels. No acute fracture. No aggressive appearing focal osseous lesion or focal pathologic process. Soft tissues and spinal canal: No prevertebral fluid or swelling. No visible canal hematoma. Upper chest: Biapical pleural/pulmonary scarring. Other: None. IMPRESSION: 1. Right frontoparietal subarachnoid hemorrhage. Associated underlying intraparenchymal hemorrhage not excluded. Recommend repeat CT head in 6 hours. 2. No acute facial fracture in a patient with left maxillary subcutaneus soft tissue hematoma formation. 3. No acute displaced fracture or traumatic listhesis of the cervical spine in a patient with severe multilevel degenerative changes. These results were called by telephone at the time of interpretation on 08/24/2020 at 7:46 pm to provider Dr. Alvino Chapel, who verbally acknowledged these results. Electronically Signed   By: Iven Finn M.D.   On: 08/24/2020 19:53   CT Cervical Spine Wo Contrast  Result Date: 08/24/2020 CLINICAL DATA:  Status post fall 1 getting female. Possible loss of consciousness. Facial swelling left cheek. Abrasion. EXAM: CT HEAD WITHOUT CONTRAST CT MAXILLOFACIAL WITHOUT CONTRAST CT CERVICAL SPINE WITHOUT CONTRAST TECHNIQUE: Multidetector CT imaging of the head, cervical spine, and maxillofacial structures were performed using the standard protocol without intravenous contrast. Multiplanar CT image reconstructions of the cervical spine and maxillofacial structures were also generated. COMPARISON:  MRA head 12/12/2017 FINDINGS: CT HEAD FINDINGS Brain: Cerebral ventricle sizes are concordant with the degree of cerebral volume loss. Loss Patchy and confluent areas of decreased attenuation are noted throughout the deep and periventricular white matter of the cerebral hemispheres bilaterally, compatible with chronic  microvascular ischemic disease. No evidence of large-territorial acute infarction. No definite parenchymal hemorrhage. No mass lesion. Small moderate volume right frontoparietal sub arachnoid hemorrhage. No mass effect or midline shift. No hydrocephalus. No intraventricular extension of the hemorrhage. Basilar cisterns are patent. Vascular: No hyperdense vessel. Skull: No acute fracture or focal lesion. Other: None. CT MAXILLOFACIAL FINDINGS Osseous: No acute displaced fracture. Sinuses/Orbits: Paranasal sinuses and mastoid air cells are clear. Bilateral lens replacement. Otherwise the orbits are unremarkable. Soft tissues: Left maxillary subcutaneus soft tissue edema with associated 2 x 1 cm sub cutaneous hematoma formation. CT CERVICAL SPINE FINDINGS Alignment: Straightening of the normal cervical lordosis likely due to positioning. Grade 1 anterolisthesis of L3 on L4 and L4 on L5. Skull base and vertebrae: Severe multilevel degenerative changes of the cervical spine with multilevel intervertebral disc space narrowing which is most prominent at the C6-C7 levels. No acute fracture. No aggressive appearing focal osseous lesion or focal pathologic process. Soft tissues and spinal canal: No prevertebral fluid or swelling. No visible canal hematoma. Upper chest: Biapical pleural/pulmonary scarring. Other: None.  IMPRESSION: 1. Right frontoparietal subarachnoid hemorrhage. Associated underlying intraparenchymal hemorrhage not excluded. Recommend repeat CT head in 6 hours. 2. No acute facial fracture in a patient with left maxillary subcutaneus soft tissue hematoma formation. 3. No acute displaced fracture or traumatic listhesis of the cervical spine in a patient with severe multilevel degenerative changes. These results were called by telephone at the time of interpretation on 08/24/2020 at 7:46 pm to provider Dr. Alvino Chapel, who verbally acknowledged these results. Electronically Signed   By: Iven Finn M.D.   On:  08/24/2020 19:53   CT Maxillofacial Wo Contrast  Result Date: 08/24/2020 CLINICAL DATA:  Status post fall 1 getting female. Possible loss of consciousness. Facial swelling left cheek. Abrasion. EXAM: CT HEAD WITHOUT CONTRAST CT MAXILLOFACIAL WITHOUT CONTRAST CT CERVICAL SPINE WITHOUT CONTRAST TECHNIQUE: Multidetector CT imaging of the head, cervical spine, and maxillofacial structures were performed using the standard protocol without intravenous contrast. Multiplanar CT image reconstructions of the cervical spine and maxillofacial structures were also generated. COMPARISON:  MRA head 12/12/2017 FINDINGS: CT HEAD FINDINGS Brain: Cerebral ventricle sizes are concordant with the degree of cerebral volume loss. Loss Patchy and confluent areas of decreased attenuation are noted throughout the deep and periventricular white matter of the cerebral hemispheres bilaterally, compatible with chronic microvascular ischemic disease. No evidence of large-territorial acute infarction. No definite parenchymal hemorrhage. No mass lesion. Small moderate volume right frontoparietal sub arachnoid hemorrhage. No mass effect or midline shift. No hydrocephalus. No intraventricular extension of the hemorrhage. Basilar cisterns are patent. Vascular: No hyperdense vessel. Skull: No acute fracture or focal lesion. Other: None. CT MAXILLOFACIAL FINDINGS Osseous: No acute displaced fracture. Sinuses/Orbits: Paranasal sinuses and mastoid air cells are clear. Bilateral lens replacement. Otherwise the orbits are unremarkable. Soft tissues: Left maxillary subcutaneus soft tissue edema with associated 2 x 1 cm sub cutaneous hematoma formation. CT CERVICAL SPINE FINDINGS Alignment: Straightening of the normal cervical lordosis likely due to positioning. Grade 1 anterolisthesis of L3 on L4 and L4 on L5. Skull base and vertebrae: Severe multilevel degenerative changes of the cervical spine with multilevel intervertebral disc space narrowing which  is most prominent at the C6-C7 levels. No acute fracture. No aggressive appearing focal osseous lesion or focal pathologic process. Soft tissues and spinal canal: No prevertebral fluid or swelling. No visible canal hematoma. Upper chest: Biapical pleural/pulmonary scarring. Other: None. IMPRESSION: 1. Right frontoparietal subarachnoid hemorrhage. Associated underlying intraparenchymal hemorrhage not excluded. Recommend repeat CT head in 6 hours. 2. No acute facial fracture in a patient with left maxillary subcutaneus soft tissue hematoma formation. 3. No acute displaced fracture or traumatic listhesis of the cervical spine in a patient with severe multilevel degenerative changes. These results were called by telephone at the time of interpretation on 08/24/2020 at 7:46 pm to provider Dr. Alvino Chapel, who verbally acknowledged these results. Electronically Signed   By: Iven Finn M.D.   On: 08/24/2020 19:53    Microbiology: Recent Results (from the past 240 hour(s))  Resp Panel by RT-PCR (Flu A&B, Covid) Nasopharyngeal Swab     Status: None   Collection Time: 08/24/20  8:30 PM   Specimen: Nasopharyngeal Swab; Nasopharyngeal(NP) swabs in vial transport medium  Result Value Ref Range Status   SARS Coronavirus 2 by RT PCR NEGATIVE NEGATIVE Final    Comment: (NOTE) SARS-CoV-2 target nucleic acids are NOT DETECTED.  The SARS-CoV-2 RNA is generally detectable in upper respiratory specimens during the acute phase of infection. The lowest concentration of SARS-CoV-2 viral copies this assay can  detect is 138 copies/mL. A negative result does not preclude SARS-Cov-2 infection and should not be used as the sole basis for treatment or other patient management decisions. A negative result may occur with  improper specimen collection/handling, submission of specimen other than nasopharyngeal swab, presence of viral mutation(s) within the areas targeted by this assay, and inadequate number of  viral copies(<138 copies/mL). A negative result must be combined with clinical observations, patient history, and epidemiological information. The expected result is Negative.  Fact Sheet for Patients:  EntrepreneurPulse.com.au  Fact Sheet for Healthcare Providers:  IncredibleEmployment.be  This test is no t yet approved or cleared by the [place] FDA and  has been authorized for detection and/or diagnosis of SARS-CoV-2 by FDA under an Emergency Use Authorization (EUA). This EUA will remain  in effect (meaning this test can be used) for the duration of the COVID-19 declaration under Section 564(b)(1) of the Act, 21 U.S.C.section 360bbb-3(b)(1), unless the authorization is terminated  or revoked sooner.       Influenza A by PCR NEGATIVE NEGATIVE Final   Influenza B by PCR NEGATIVE NEGATIVE Final    Comment: (NOTE) The Xpert Xpress SARS-CoV-2/FLU/RSV plus assay is intended as an aid in the diagnosis of influenza from Nasopharyngeal swab specimens and should not be used as a sole basis for treatment. Nasal washings and aspirates are unacceptable for Xpert Xpress SARS-CoV-2/FLU/RSV testing.  Fact Sheet for Patients: EntrepreneurPulse.com.au  Fact Sheet for Healthcare Providers: IncredibleEmployment.be  This test is not yet approved or cleared by the [place] FDA and has been authorized for detection and/or diagnosis of SARS-CoV-2 by FDA under an Emergency Use Authorization (EUA). This EUA will remain in effect (meaning this test can be used) for the duration of the COVID-19 declaration under Section 564(b)(1) of the Act, 21 U.S.C. section 360bbb-3(b)(1), unless the authorization is terminated or revoked.  Performed at [facility], [place] [address] [place], [place] [address]      Labs: Basic Metabolic Panel: Recent Labs  Lab 08/24/20 2006 08/27/20 0112  NA 138 137  K  4.8 3.5  CL 105 102  CO2 24 26  GLUCOSE 93 99  BUN 20 16  CREATININE 0.76 0.88  CALCIUM 8.6* 8.2*   Liver Function Tests: Recent Labs  Lab 08/24/20 2006  AST 31  ALT 12  ALKPHOS 62  BILITOT 0.5  PROT 7.1  ALBUMIN 1.9*   No results for input(s): LIPASE, AMYLASE in the last 168 hours. No results for input(s): AMMONIA in the last 168 hours. CBC: Recent Labs  Lab 08/24/20 2006 08/27/20 0112  WBC 40.0* 43.8*  NEUTROABS 8.8* 2.7  HGB 11.3* 9.9*  HCT 35.1* 29.8*  MCV 106.0* 103.5*  PLT 168 138*   Cardiac Enzymes: No results for input(s): CKTOTAL, CKMB, CKMBINDEX, TROPONINI in the last 168 hours. BNP: BNP (last 3 results) No results for input(s): BNP in the last 8760 hours.  ProBNP (last 3 results) No results for input(s): PROBNP in the last 8760 hours.  CBG: No results for input(s): GLUCAP in the last 168 hours.  Principal Problem:   Subarachnoid hemorrhage (HCC) Active Problems:   Trigeminal neuralgia   Lymphoma, marginal zone, lymph nodes of multiple sites ([place])   Fall   Macrocytic anemia   SAH (subarachnoid hemorrhage) (HCC)   Pressure injury of skin  Time coordinating discharge: 38 minutes.  Signed:        Tauheedah Bok, DO [facility]  08/29/2020, 1:02 PM

## 2020-08-29 NOTE — TOC Transition Note (Signed)
Transition of Care [facility]) - CM/SW Discharge Note   Patient Details  Name: Joy Lynch MRN: [id number] Date of Birth: 11-23-1925  [facility]) CM/SW Contact:  Joanne Chars, LCSW Phone Number: 08/29/2020, 1:25 PM   Clinical Narrative:   Pt discharging to [facility] SNF, Room 36.  RN call report to [phone].    Final next level of care: Skilled Nursing Facility Barriers to Discharge: Barriers Resolved   Patient Goals and CMS Choice Patient states their goals for this hospitalization and ongoing recovery are:: less pain after her fall CMS Medicare.gov Compare Post Acute Care list provided to::  (Pt current resident at [facility], will return there)    Discharge Placement              Patient chooses bed at: [facility] Patient to be transferred to facility by: [place] Name of family member notified: [name], nephew Patient and family notified of of transfer: 08/29/20  Discharge Plan and Services     Post Acute Care Choice: [place]                               Social Determinants of Health (SDOH) Interventions     Readmission Risk Interventions No flowsheet data found.

## 2020-08-29 NOTE — Progress Notes (Signed)
Called report to Katie at [facility] SNF

## 2020-08-29 NOTE — TOC Progression Note (Signed)
Transition of Care [facility]) - Progression Note    Patient Details  Name: Mariah Gerstenberger MRN: [id number] Date of Birth: 09/28/1926  Transition of [facility]) CM/SW Contact  Joanne Chars, LCSW Phone Number: 08/29/2020, 10:46 AM  Clinical Narrative:   Josem Kaufmann received from [place]: [id number], 3 days starting 11/29, review on 12/1.  Gilberto Better, is reviewer, fax [phone].    Expected Discharge Plan: Skilled Nursing Facility Barriers to Discharge: Insurance Authorization  Expected Discharge Plan and Services Expected Discharge Plan: [place] Choice: [place] arrangements for the past 2 months: [place]                                       Social Determinants of Health (SDOH) Interventions    Readmission Risk Interventions No flowsheet data found.

## 2020-08-30 ENCOUNTER — Non-Acute Institutional Stay (SKILLED_NURSING_FACILITY): Payer: Medicare Other | Admitting: Nurse Practitioner

## 2020-08-30 ENCOUNTER — Encounter: Payer: Self-pay | Admitting: Nurse Practitioner

## 2020-08-30 DIAGNOSIS — R0789 Other chest pain: Secondary | ICD-10-CM

## 2020-08-30 DIAGNOSIS — I10 Essential (primary) hypertension: Secondary | ICD-10-CM | POA: Insufficient documentation

## 2020-08-30 DIAGNOSIS — I609 Nontraumatic subarachnoid hemorrhage, unspecified: Secondary | ICD-10-CM | POA: Diagnosis not present

## 2020-08-30 DIAGNOSIS — G43709 Chronic migraine without aura, not intractable, without status migrainosus: Secondary | ICD-10-CM

## 2020-08-30 DIAGNOSIS — E871 Hypo-osmolality and hyponatremia: Secondary | ICD-10-CM | POA: Diagnosis not present

## 2020-08-30 DIAGNOSIS — E039 Hypothyroidism, unspecified: Secondary | ICD-10-CM | POA: Diagnosis not present

## 2020-08-30 DIAGNOSIS — I639 Cerebral infarction, unspecified: Secondary | ICD-10-CM | POA: Insufficient documentation

## 2020-08-30 DIAGNOSIS — C8588 Other specified types of non-Hodgkin lymphoma, lymph nodes of multiple sites: Secondary | ICD-10-CM

## 2020-08-30 DIAGNOSIS — R269 Unspecified abnormalities of gait and mobility: Secondary | ICD-10-CM

## 2020-08-30 DIAGNOSIS — N750 Cyst of Bartholin's gland: Secondary | ICD-10-CM

## 2020-08-30 DIAGNOSIS — G43909 Migraine, unspecified, not intractable, without status migrainosus: Secondary | ICD-10-CM | POA: Insufficient documentation

## 2020-08-30 DIAGNOSIS — H04123 Dry eye syndrome of bilateral lacrimal glands: Secondary | ICD-10-CM | POA: Insufficient documentation

## 2020-08-30 DIAGNOSIS — M810 Age-related osteoporosis without current pathological fracture: Secondary | ICD-10-CM

## 2020-08-30 DIAGNOSIS — G500 Trigeminal neuralgia: Secondary | ICD-10-CM

## 2020-08-30 LAB — PATHOLOGIST SMEAR REVIEW

## 2020-08-30 NOTE — Assessment & Plan Note (Signed)
Hypothyroidism, takes Levothyroxine. 

## 2020-08-30 NOTE — Assessment & Plan Note (Signed)
Left sided chest wall pain with deep breathing and movement, denied SOB, cough, sputum production. In hospital X-ray left chest/ribs showed no acute fxs. Will obtain X ray thoracic spine, left ribs, ap/lateral view chest X-ray to evaluate further.

## 2020-08-30 NOTE — Assessment & Plan Note (Signed)
Hospital stay 08/24/20-08/29/20 for subarachnoid hemorrhage/intraparenchymal hemorrhage sustained from a mechanical fall at [facility] when she walked to the dinning room and tripped and fell on her face, f/u neurosurgery in 2 weeks Dr. Duffy Rhody. X-ray left chest/ribs showed no acute fxs. CT head showed right frontoparietal subarachnoid hemorrhage/intraparenchymal hemorrhage. Started Keppra 500mg  bid x 2weeks, resume ASA after 2 weeks.

## 2020-08-30 NOTE — Assessment & Plan Note (Signed)
Bartholin gland cysts on each labia, no intervention needed.

## 2020-08-30 NOTE — Assessment & Plan Note (Signed)
Hyponatremia Na 138 08/27/20

## 2020-08-30 NOTE — Assessment & Plan Note (Signed)
HTN recommended MAP range 70-90, Bun/creat 16/0.88 08/27/20

## 2020-08-30 NOTE — Assessment & Plan Note (Signed)
OP DEXA 02/25/20 tscore -3.8

## 2020-08-30 NOTE — Assessment & Plan Note (Signed)
Gait abnormality, therapy.

## 2020-08-30 NOTE — Assessment & Plan Note (Signed)
Wants to resume home artificial tears qid.

## 2020-08-30 NOTE — Progress Notes (Signed)
Location:   SNF [place] Room Number: 4 Place of Service:  SNF (31) Provider: Lennie Odor Breanda Greenlaw NP  Cari Caraway, MD  Patient Care Team: Cari Caraway, MD as PCP - General (Family Medicine)  Extended Emergency Contact Information Primary Emergency Contact: [place], [place] Mobile Phone: [phone] Relation: Nephew Secondary Emergency Contact: [place] Phone: [phone] Mobile Phone: [phone] Relation: Relative  Code Status:  DNR Goals of care: Advanced Directive information Advanced Directives 02/18/2017  Does Patient Have a Medical Advance Directive? Yes  Type of Paramedic of [place];Living will  Does patient want to make changes to medical advance directive? No - Patient declined  Copy of [place] in Chart? No - copy requested  Would patient like information on creating a medical advance directive? -     Chief Complaint  Patient presents with   Acute Visit    Medication review    HPI:  Pt is a 86 y.o. female seen today for an acute visit for review medications following hospitalization  Hospital stay 08/24/20-08/29/20 for subarachnoid hemorrhage/intraparenchymal hemorrhage sustained from a mechanical fall at [facility] when she walked to the dinning room and tripped and fell on her face, f/u neurosurgery in 2 weeks Dr. Duffy Rhody. X-ray left chest/ribs showed no acute fxs. CT head showed right frontoparietal subarachnoid hemorrhage/intraparenchymal hemorrhage. Started Keppra 500mg  bid x 2weeks, resume ASA after 2 weeks.   Marginal zone lymphoma, f/u oncology, wbc 40s   Hyponatremia Na 138 08/27/20  Hypothyroidism, takes Levothyroxine   Chronic left cerebellar infarct, hx of   Gait abnormality, therapy.   OP DEXA 02/25/20 tscore -3.8  Migraines, takes Propranolol.   HTN recommended MAP range 70-90, Bun/creat 16/0.88 08/27/20  Trigeminal neuralgia, takes Gabpantin  Bartholin  gland cysts on each labia   Past Medical History:  Diagnosis Date   Cancer ([place])    non hodgkin lymphoma   Chronic lymphocytic leukemia ([place])    History of B-cell lymphoma 11/02/2015   Migraines    Osteoporosis    Thyroid disease    Trigeminal neuralgia of left side of face    Past Surgical History:  Procedure Laterality Date   gamma knife     for trigeminal neuralgia   SHOULDER SURGERY     Right shoulder replacement   TONSILLECTOMY AND ADENOIDECTOMY     WISDOM TOOTH EXTRACTION      Allergies  Allergen Reactions   Penicillins Hives   Carbamazepine Other (See Comments) and Hives    Turned purple from neck down and drowsy Other reaction(s): Other (See Comments) Turned purple from neck down and drowsy   Pregabalin Other (See Comments)    Drowsy Other reaction(s): Confusion (intolerance) Drowsy    Allergies as of 08/30/2020      Reactions   Penicillins Hives   Carbamazepine Other (See Comments), Hives   Turned purple from neck down and drowsy Other reaction(s): Other (See Comments) Turned purple from neck down and drowsy   Pregabalin Other (See Comments)   Drowsy Other reaction(s): Confusion (intolerance) Drowsy      Medication List       Accurate as of August 30, 2020 11:59 PM. If you have any questions, ask your nurse or doctor.        aspirin EC 81 MG tablet Take 1 tablet (81 mg total) by mouth daily. Swallow whole. Start taking on: September 09, 2020   Biotin 10 MG Caps Take 10 mg by mouth daily.  calcium-vitamin D 500-200 MG-UNIT tablet Commonly known as: OSCAL WITH D Take 1 tablet by mouth daily.   gabapentin 800 MG tablet Commonly known as: NEURONTIN Take 800 mg by mouth 3 (three) times daily.   hydroxypropyl methylcellulose / hypromellose 2.5 % ophthalmic solution Commonly known as: ISOPTO TEARS / GONIOVISC Place 1 drop into both eyes 4 (four) times daily as needed for dry eyes.   levETIRAcetam 500 MG tablet Commonly known  as: KEPPRA Take 1 tablet (500 mg total) by mouth 2 (two) times daily for 10 days.   levothyroxine 75 MCG tablet Commonly known as: SYNTHROID Take 75 mcg by mouth daily before breakfast.   Multi-Vitamins Tabs Take 1 tablet by mouth daily.   propranolol 10 MG tablet Commonly known as: INDERAL Take 10 mg by mouth daily.   sodium chloride 0.65 % Soln nasal spray Commonly known as: OCEAN Place 2 sprays into both nostrils every 4 (four) hours.   zinc oxide 20 % ointment Apply 1 application topically as needed for irritation. To buttocks after every incontinent episode and as needed for redness. May keep at bedside.       Review of Systems  Constitutional: Negative for activity change, appetite change, fatigue and fever.  HENT: Positive for hearing loss. Negative for congestion, sinus pain and sore throat.   Eyes: Negative for visual disturbance.       Dry eyes  Respiratory: Negative for cough, chest tightness, shortness of breath and wheezing.   Cardiovascular: Positive for chest pain and leg swelling. Negative for palpitations.       Left sided chest wall pain with deep breathing and movement.   Gastrointestinal: Negative for abdominal pain, constipation, nausea and vomiting.  Genitourinary: Negative for difficulty urinating, dysuria, frequency, hematuria and urgency.       Bartholin cysts.   Musculoskeletal: Positive for arthralgias and gait problem.  Skin: Positive for color change.       Left facial/neck bruise.   Neurological: Negative for dizziness, seizures, facial asymmetry, speech difficulty, weakness and headaches.  Hematological: Bruises/bleeds easily.  Psychiatric/Behavioral: Negative for agitation, behavioral problems, confusion, hallucinations and sleep disturbance. The patient is not nervous/anxious.     Immunization History  Administered Date(s) Administered   Fluad Quad(high Dose 65+) 05/28/2019   Influenza, High Dose Seasonal PF 06/11/2017, 06/28/2018    Influenza-Unspecified 06/21/2020   Moderna SARS-COVID-2 Vaccination 10/05/2019, 11/02/2019   Tdap 12/28/2017, 07/01/2018   Zoster Recombinat (Shingrix) 07/01/2018   Pertinent  Health Maintenance Due  Topic Date Due   PNA vac Low Risk Adult (1 of 2 - PCV13) Never done   INFLUENZA VACCINE  Completed   DEXA SCAN  Completed   No flowsheet data found. Functional Status Survey:    Vitals:   08/30/20 1028  BP: 136/74  Pulse: 98  Resp: 16  Temp: (!) 97.1 F (36.2 C)  SpO2: 96%  Weight: 128 lb (58.1 kg)  Height: 5\' 6"  (1.676 m)   Body mass index is 20.66 kg/m. Physical Exam Vitals and nursing note reviewed.  Constitutional:      General: She is not in acute distress.    Appearance: Normal appearance. She is not ill-appearing, toxic-appearing or diaphoretic.  HENT:     Head:     Comments: Left facial/neck bruise    Nose: Nose normal.     Mouth/Throat:     Mouth: Mucous membranes are moist.  Eyes:     Extraocular Movements: Extraocular movements intact.     Conjunctiva/sclera: Conjunctivae normal.  Pupils: Pupils are equal, round, and reactive to light.  Cardiovascular:     Rate and Rhythm: Normal rate and regular rhythm.     Heart sounds: No murmur heard.   Pulmonary:     Effort: Pulmonary effort is normal.     Breath sounds: Normal breath sounds. No wheezing, rhonchi or rales.  Abdominal:     General: Bowel sounds are normal. There is no distension.     Palpations: Abdomen is soft.     Tenderness: There is no abdominal tenderness. There is no right CVA tenderness, left CVA tenderness, guarding or rebound.  Musculoskeletal:     Cervical back: Normal range of motion and neck supple.     Right lower leg: Edema present.     Left lower leg: Edema present.     Comments: Trace edema BLE. Left chest wall pain palpated and also with deep breathing/movment.   Skin:    General: Skin is warm and dry.     Findings: Bruising present.     Comments: Left facial/neck  bruise.   Neurological:     General: No focal deficit present.     Mental Status: She is alert and oriented to person, place, and time. Mental status is at baseline.     Gait: Gait abnormal.  Psychiatric:        Mood and Affect: Mood normal.        Behavior: Behavior normal.        Thought Content: Thought content normal.        Judgment: Judgment normal.     Labs reviewed: Recent Labs    06/27/20 1025 08/24/20 2006 08/27/20 0112  NA 139 138 137  K 4.6 4.8 3.5  CL 104 105 102  CO2 31 24 26   GLUCOSE 97 93 99  BUN 20 20 16   CREATININE 0.92 0.76 0.88  CALCIUM 8.7* 8.6* 8.2*   Recent Labs    12/24/19 1331 06/27/20 1025 08/24/20 2006  AST 24 27 31   ALT 11 11 12   ALKPHOS 59 73 62  BILITOT 0.5 0.4 0.5  PROT 7.7 7.5 7.1  ALBUMIN 3.1* 1.8* 1.9*   Recent Labs    06/27/20 1025 08/24/20 2006 08/27/20 0112  WBC 35.6* 40.0* 43.8*  NEUTROABS 6.1 8.8* 2.7  HGB 10.7* 11.3* 9.9*  HCT 32.7* 35.1* 29.8*  MCV 101.9* 106.0* 103.5*  PLT 164 168 138*   No results found for: TSH No results found for: HGBA1C No results found for: CHOL, HDL, LDLCALC, LDLDIRECT, TRIG, CHOLHDL  Significant Diagnostic Results in last 30 days:  DG Ribs Unilateral W/Chest Left  Result Date: 08/24/2020 CLINICAL DATA:  Status post fall. EXAM: LEFT RIBS AND CHEST - 3+ VIEW COMPARISON:  December 28, 2017 FINDINGS: A radiopaque marker was placed at the site of the patient's pain. No fracture or other bone lesions are seen involving the ribs. An intact right shoulder replacement is noted. Multilevel degenerative changes seen throughout the thoracic spine. There is no evidence of pneumothorax or pleural effusion. Both lungs are clear. Heart size and mediastinal contours are within normal limits. IMPRESSION: 1. No acute rib fracture. Electronically Signed   By: Virgina Norfolk M.D.   On: 08/24/2020 19:51   CT HEAD WO CONTRAST  Result Date: 08/25/2020 CLINICAL DATA:  Follow-up subarachnoid hemorrhage. EXAM: CT  HEAD WITHOUT CONTRAST TECHNIQUE: Contiguous axial images were obtained from the base of the skull through the vertex without intravenous contrast. COMPARISON:  08/24/2020 FINDINGS: Brain: Focal acute right frontoparietal  subarachnoid hemorrhage with possible adjacent surface parenchymal hemorrhage. No change in appearance or distribution since the previous study. No evidence of significant progression. Unchanged cerebral atrophy and small vessel ischemic change. No mass effect or midline shift. No significant ventricular dilatation. Vascular: Moderate intracranial arterial vascular calcifications. Skull: Calvarium appears intact. Sinuses/Orbits: Paranasal sinuses and mastoid air cells are clear. Other: None. IMPRESSION: 1. Focal acute right frontoparietal subarachnoid hemorrhage with possible adjacent surface parenchymal hemorrhage. No progression or developing complications since previous study. 2. Unchanged cerebral atrophy and small vessel ischemic change. Electronically Signed   By: Lucienne Capers M.D.   On: 08/25/2020 01:19   CT Head Wo Contrast  Result Date: 08/24/2020 CLINICAL DATA:  Status post fall 1 getting female. Possible loss of consciousness. Facial swelling left cheek. Abrasion. EXAM: CT HEAD WITHOUT CONTRAST CT MAXILLOFACIAL WITHOUT CONTRAST CT CERVICAL SPINE WITHOUT CONTRAST TECHNIQUE: Multidetector CT imaging of the head, cervical spine, and maxillofacial structures were performed using the standard protocol without intravenous contrast. Multiplanar CT image reconstructions of the cervical spine and maxillofacial structures were also generated. COMPARISON:  MRA head 12/12/2017 FINDINGS: CT HEAD FINDINGS Brain: Cerebral ventricle sizes are concordant with the degree of cerebral volume loss. Loss Patchy and confluent areas of decreased attenuation are noted throughout the deep and periventricular white matter of the cerebral hemispheres bilaterally, compatible with chronic microvascular ischemic  disease. No evidence of large-territorial acute infarction. No definite parenchymal hemorrhage. No mass lesion. Small moderate volume right frontoparietal sub arachnoid hemorrhage. No mass effect or midline shift. No hydrocephalus. No intraventricular extension of the hemorrhage. Basilar cisterns are patent. Vascular: No hyperdense vessel. Skull: No acute fracture or focal lesion. Other: None. CT MAXILLOFACIAL FINDINGS Osseous: No acute displaced fracture. Sinuses/Orbits: Paranasal sinuses and mastoid air cells are clear. Bilateral lens replacement. Otherwise the orbits are unremarkable. Soft tissues: Left maxillary subcutaneus soft tissue edema with associated 2 x 1 cm sub cutaneous hematoma formation. CT CERVICAL SPINE FINDINGS Alignment: Straightening of the normal cervical lordosis likely due to positioning. Grade 1 anterolisthesis of L3 on L4 and L4 on L5. Skull base and vertebrae: Severe multilevel degenerative changes of the cervical spine with multilevel intervertebral disc space narrowing which is most prominent at the C6-C7 levels. No acute fracture. No aggressive appearing focal osseous lesion or focal pathologic process. Soft tissues and spinal canal: No prevertebral fluid or swelling. No visible canal hematoma. Upper chest: Biapical pleural/pulmonary scarring. Other: None. IMPRESSION: 1. Right frontoparietal subarachnoid hemorrhage. Associated underlying intraparenchymal hemorrhage not excluded. Recommend repeat CT head in 6 hours. 2. No acute facial fracture in a patient with left maxillary subcutaneus soft tissue hematoma formation. 3. No acute displaced fracture or traumatic listhesis of the cervical spine in a patient with severe multilevel degenerative changes. These results were called by telephone at the time of interpretation on 08/24/2020 at 7:46 pm to provider Dr. Alvino Chapel, who verbally acknowledged these results. Electronically Signed   By: Iven Finn M.D.   On: 08/24/2020 19:53   CT  Cervical Spine Wo Contrast  Result Date: 08/24/2020 CLINICAL DATA:  Status post fall 1 getting female. Possible loss of consciousness. Facial swelling left cheek. Abrasion. EXAM: CT HEAD WITHOUT CONTRAST CT MAXILLOFACIAL WITHOUT CONTRAST CT CERVICAL SPINE WITHOUT CONTRAST TECHNIQUE: Multidetector CT imaging of the head, cervical spine, and maxillofacial structures were performed using the standard protocol without intravenous contrast. Multiplanar CT image reconstructions of the cervical spine and maxillofacial structures were also generated. COMPARISON:  MRA head 12/12/2017 FINDINGS: CT HEAD FINDINGS Brain: Cerebral  ventricle sizes are concordant with the degree of cerebral volume loss. Loss Patchy and confluent areas of decreased attenuation are noted throughout the deep and periventricular white matter of the cerebral hemispheres bilaterally, compatible with chronic microvascular ischemic disease. No evidence of large-territorial acute infarction. No definite parenchymal hemorrhage. No mass lesion. Small moderate volume right frontoparietal sub arachnoid hemorrhage. No mass effect or midline shift. No hydrocephalus. No intraventricular extension of the hemorrhage. Basilar cisterns are patent. Vascular: No hyperdense vessel. Skull: No acute fracture or focal lesion. Other: None. CT MAXILLOFACIAL FINDINGS Osseous: No acute displaced fracture. Sinuses/Orbits: Paranasal sinuses and mastoid air cells are clear. Bilateral lens replacement. Otherwise the orbits are unremarkable. Soft tissues: Left maxillary subcutaneus soft tissue edema with associated 2 x 1 cm sub cutaneous hematoma formation. CT CERVICAL SPINE FINDINGS Alignment: Straightening of the normal cervical lordosis likely due to positioning. Grade 1 anterolisthesis of L3 on L4 and L4 on L5. Skull base and vertebrae: Severe multilevel degenerative changes of the cervical spine with multilevel intervertebral disc space narrowing which is most prominent at the  C6-C7 levels. No acute fracture. No aggressive appearing focal osseous lesion or focal pathologic process. Soft tissues and spinal canal: No prevertebral fluid or swelling. No visible canal hematoma. Upper chest: Biapical pleural/pulmonary scarring. Other: None. IMPRESSION: 1. Right frontoparietal subarachnoid hemorrhage. Associated underlying intraparenchymal hemorrhage not excluded. Recommend repeat CT head in 6 hours. 2. No acute facial fracture in a patient with left maxillary subcutaneus soft tissue hematoma formation. 3. No acute displaced fracture or traumatic listhesis of the cervical spine in a patient with severe multilevel degenerative changes. These results were called by telephone at the time of interpretation on 08/24/2020 at 7:46 pm to provider Dr. Alvino Chapel, who verbally acknowledged these results. Electronically Signed   By: Iven Finn M.D.   On: 08/24/2020 19:53   CT Maxillofacial Wo Contrast  Result Date: 08/24/2020 CLINICAL DATA:  Status post fall 1 getting female. Possible loss of consciousness. Facial swelling left cheek. Abrasion. EXAM: CT HEAD WITHOUT CONTRAST CT MAXILLOFACIAL WITHOUT CONTRAST CT CERVICAL SPINE WITHOUT CONTRAST TECHNIQUE: Multidetector CT imaging of the head, cervical spine, and maxillofacial structures were performed using the standard protocol without intravenous contrast. Multiplanar CT image reconstructions of the cervical spine and maxillofacial structures were also generated. COMPARISON:  MRA head 12/12/2017 FINDINGS: CT HEAD FINDINGS Brain: Cerebral ventricle sizes are concordant with the degree of cerebral volume loss. Loss Patchy and confluent areas of decreased attenuation are noted throughout the deep and periventricular white matter of the cerebral hemispheres bilaterally, compatible with chronic microvascular ischemic disease. No evidence of large-territorial acute infarction. No definite parenchymal hemorrhage. No mass lesion. Small moderate volume right  frontoparietal sub arachnoid hemorrhage. No mass effect or midline shift. No hydrocephalus. No intraventricular extension of the hemorrhage. Basilar cisterns are patent. Vascular: No hyperdense vessel. Skull: No acute fracture or focal lesion. Other: None. CT MAXILLOFACIAL FINDINGS Osseous: No acute displaced fracture. Sinuses/Orbits: Paranasal sinuses and mastoid air cells are clear. Bilateral lens replacement. Otherwise the orbits are unremarkable. Soft tissues: Left maxillary subcutaneus soft tissue edema with associated 2 x 1 cm sub cutaneous hematoma formation. CT CERVICAL SPINE FINDINGS Alignment: Straightening of the normal cervical lordosis likely due to positioning. Grade 1 anterolisthesis of L3 on L4 and L4 on L5. Skull base and vertebrae: Severe multilevel degenerative changes of the cervical spine with multilevel intervertebral disc space narrowing which is most prominent at the C6-C7 levels. No acute fracture. No aggressive appearing focal osseous lesion or  focal pathologic process. Soft tissues and spinal canal: No prevertebral fluid or swelling. No visible canal hematoma. Upper chest: Biapical pleural/pulmonary scarring. Other: None. IMPRESSION: 1. Right frontoparietal subarachnoid hemorrhage. Associated underlying intraparenchymal hemorrhage not excluded. Recommend repeat CT head in 6 hours. 2. No acute facial fracture in a patient with left maxillary subcutaneus soft tissue hematoma formation. 3. No acute displaced fracture or traumatic listhesis of the cervical spine in a patient with severe multilevel degenerative changes. These results were called by telephone at the time of interpretation on 08/24/2020 at 7:46 pm to provider Dr. Alvino Chapel, who verbally acknowledged these results. Electronically Signed   By: Iven Finn M.D.   On: 08/24/2020 19:53    Assessment/Plan Subarachnoid hemorrhage [facility]) Hospital stay 08/24/20-08/29/20 for subarachnoid hemorrhage/intraparenchymal hemorrhage  sustained from a mechanical fall at [facility] when she walked to the dinning room and tripped and fell on her face, f/u neurosurgery in 2 weeks Dr. Duffy Rhody. X-ray left chest/ribs showed no acute fxs. CT head showed right frontoparietal subarachnoid hemorrhage/intraparenchymal hemorrhage. Started Keppra 500mg  bid x 2weeks, resume ASA after 2 weeks.   Lymphoma, marginal zone, lymph nodes of multiple sites ([place]) Marginal zone lymphoma, f/u oncology, wbc 40s    Hyponatremia Hyponatremia Na 138 08/27/20  Hypothyroidism Hypothyroidism, takes Levothyroxine   Cerebellar infarct ([place]) Chronic left cerebellar infarct, hx of   Gait abnormality Gait abnormality, therapy.   Osteoporosis OP DEXA 02/25/20 tscore -3.8   Trigeminal neuralgia Takes Gabapentin  Migraines Migraines, takes Propranolol.    HTN (hypertension) HTN recommended MAP range 70-90, Bun/creat 16/0.88 08/27/20   Bartholin gland cyst Bartholin gland cysts on each labia, no intervention needed.   Left-sided chest wall pain Left sided chest wall pain with deep breathing and movement, denied SOB, cough, sputum production. In hospital X-ray left chest/ribs showed no acute fxs. Will obtain X ray thoracic spine, left ribs, ap/lateral view chest X-ray to evaluate further.   Dry eyes Wants to resume home artificial tears qid.     Family/ staff Communication: plan of care reviewed with the patient and charge nurse.   Labs/tests ordered:  None  Time spend 35 minutes.

## 2020-08-30 NOTE — Assessment & Plan Note (Signed)
Chronic left cerebellar infarct, hx of

## 2020-08-30 NOTE — Assessment & Plan Note (Signed)
Marginal zone lymphoma, f/u oncology, wbc 40s

## 2020-08-30 NOTE — Assessment & Plan Note (Signed)
Takes Gabapentin.  

## 2020-08-30 NOTE — Assessment & Plan Note (Signed)
Migraines, takes Propranolol.

## 2020-08-31 ENCOUNTER — Encounter: Payer: Self-pay | Admitting: Nurse Practitioner

## 2020-09-01 ENCOUNTER — Encounter: Payer: Self-pay | Admitting: Internal Medicine

## 2020-09-01 ENCOUNTER — Non-Acute Institutional Stay (SKILLED_NURSING_FACILITY): Payer: Medicare Other | Admitting: Internal Medicine

## 2020-09-01 DIAGNOSIS — E039 Hypothyroidism, unspecified: Secondary | ICD-10-CM

## 2020-09-01 DIAGNOSIS — C8588 Other specified types of non-Hodgkin lymphoma, lymph nodes of multiple sites: Secondary | ICD-10-CM | POA: Diagnosis not present

## 2020-09-01 DIAGNOSIS — J189 Pneumonia, unspecified organism: Secondary | ICD-10-CM | POA: Diagnosis not present

## 2020-09-01 DIAGNOSIS — M810 Age-related osteoporosis without current pathological fracture: Secondary | ICD-10-CM

## 2020-09-01 DIAGNOSIS — S2242XA Multiple fractures of ribs, left side, initial encounter for closed fracture: Secondary | ICD-10-CM

## 2020-09-01 DIAGNOSIS — I609 Nontraumatic subarachnoid hemorrhage, unspecified: Secondary | ICD-10-CM

## 2020-09-01 DIAGNOSIS — G500 Trigeminal neuralgia: Secondary | ICD-10-CM

## 2020-09-01 NOTE — Progress Notes (Signed)
Provider:   Location:      Place of Service:     PCP: Cari Caraway, MD Patient Care Team: Cari Caraway, MD as PCP - General (Family Medicine)  Extended Emergency Contact Information Primary Emergency Contact: [place], [place] Mobile Phone: [phone] Relation: Nephew Secondary Emergency Contact: [place] Phone: [phone] Mobile Phone: [phone] Relation: Relative  Code Status:  Goals of Care: Advanced Directive information Advanced Directives 02/18/2017  Does Patient Have a Medical Advance Directive? Yes  Type of Paramedic of [place];Living will  Does patient want to make changes to medical advance directive? No - Patient declined  Copy of [place] in Chart? No - copy requested  Would patient like information on creating a medical advance directive? -      Chief Complaint  Patient presents with  . New Admit To SNF    New admission    HPI: Patient is a 86 y.o. female seen today for admission to SNF  For Therapy after her fall  Patient has a history of lymphoma followed by oncology, osteoporosis, hypertension, hypothyroidism, trigeminal neuralgia and migraines  She independent walks with a walker.  [place] while going to the dining room.  Denies any dizziness chest pain.  Her head CT in the ED showed frontoparietal subarachnoid hemorrhage.  Repeat CT done in 24 hours did not show any progression  maxillofacial CT did not show any fractures Patient on Keppra for 7 days per neurosurgery and aspirin on hold  She was seen yesterday by [facility].  She complained of left chest pain with cough.  X-ray was done which shows multiple rib fractures and left lower lobe infiltrate Patient denies any shortness of breath or fever    Past Medical History:  Diagnosis Date  . Cancer [facility])    non hodgkin lymphoma  . Chronic lymphocytic leukemia ([place])   . History of B-cell lymphoma 11/02/2015  .  Migraines   . Osteoporosis   . Thyroid disease   . Trigeminal neuralgia of left side of face    Past Surgical History:  Procedure Laterality Date  . gamma knife     for trigeminal neuralgia  . SHOULDER SURGERY     Right shoulder replacement  . TONSILLECTOMY AND ADENOIDECTOMY    . WISDOM TOOTH EXTRACTION      reports that she quit smoking about 44 years ago. She has a 10.00 pack-year smoking history. She has never used smokeless tobacco. She reports current alcohol use of about 2.0 standard drinks of alcohol per week. She reports that she does not use drugs. Social History   Socioeconomic History  . Marital status: Divorced    Spouse name: Not on file  . Number of children: 0  . Years of education: Masters  . Highest education level: Not on file  Occupational History  . Occupation: Retired  Tobacco Use  . Smoking status: Former Smoker    Packs/day: 0.50    Years: 20.00    Pack years: 10.00    Quit date: 10/02/1975    Years since quitting: 44.9  . Smokeless tobacco: Never Used  Substance and Sexual Activity  . Alcohol use: Yes    Alcohol/week: 2.0 standard drinks    Types: 2 Glasses of wine per week  . Drug use: No  . Sexual activity: Never    Comment: divorced, no children, sister in law next of kin. Retired Actuary for [employer]  Other Topics Concern  . Not  on file  Social History Narrative   Lives at home alone.   Right-handed.   4 cups caffeine per day.   Social Determinants of Health   Financial Resource Strain:   . Difficulty of Paying Living Expenses: Not on file  Food Insecurity:   . Worried About Charity fundraiser in the Last Year: Not on file  . Ran Out of Food in the Last Year: Not on file  Transportation Needs:   . Lack of Transportation (Medical): Not on file  . Lack of Transportation (Non-Medical): Not on file  Physical Activity:   . Days of Exercise per Week: Not on file  . Minutes of Exercise per Session: Not on file  Stress:   .  Feeling of Stress : Not on file  Social Connections:   . Frequency of Communication with Friends and Family: Not on file  . Frequency of Social Gatherings with Friends and Family: Not on file  . Attends Religious Services: Not on file  . Active Member of Clubs or Organizations: Not on file  . Attends Archivist Meetings: Not on file  . Marital Status: Not on file  Intimate Partner Violence:   . Fear of Current or Ex-Partner: Not on file  . Emotionally Abused: Not on file  . Physically Abused: Not on file  . Sexually Abused: Not on file    Functional Status Survey:    Family History  Problem Relation Age of Onset  . Cancer Father        unknown ca  . Pneumonia Father   . Cancer Brother        prostate ca  . Stroke Brother   . Heart attack Mother     Health Maintenance  Topic Date Due  . PNA vac Low Risk Adult (1 of 2 - PCV13) Never done  . TETANUS/TDAP  07/01/2028  . INFLUENZA VACCINE  Completed  . DEXA SCAN  Completed  . COVID-19 Vaccine  Completed    Allergies  Allergen Reactions  . Penicillins Hives  . Carbamazepine Other (See Comments) and Hives    Turned purple from neck down and drowsy Other reaction(s): Other (See Comments) Turned purple from neck down and drowsy  . Pregabalin Other (See Comments)    Drowsy Other reaction(s): Confusion (intolerance) Drowsy    Outpatient Encounter Medications as of 09/01/2020  Medication Sig  . Acetaminophen 325 MG CAPS Take 325 mg by mouth. 650 mg; oral Special Instructions: Administer 650 mg every four hours PRN for minor pain/headache x 48 hours. Notify MD of continued pain/headache. Do not exceed 3,000 mg in 24 hours. Document area of discomfort and effectiveness of medication.  Marland Kitchen aspirin (ASPIRIN 81) 81 MG chewable tablet Chew 81 mg by mouth daily.  . Biotin 10 MG CAPS Take 10 mg by mouth daily.  . calcium-vitamin D (OSCAL WITH D) 500-200 MG-UNIT tablet Take 1 tablet by mouth daily.   Marland Kitchen gabapentin  (NEURONTIN) 800 MG tablet Take 800 mg by mouth 3 (three) times daily.  . hydroxypropyl methylcellulose / hypromellose (ISOPTO TEARS / GONIOVISC) 2.5 % ophthalmic solution Place 1 drop into both eyes 4 (four) times daily as needed for dry eyes.  Marland Kitchen levETIRAcetam (KEPPRA) 500 MG tablet Take 1 tablet (500 mg total) by mouth 2 (two) times daily for 10 days.  Marland Kitchen levothyroxine (SYNTHROID) 75 MCG tablet Take 75 mcg by mouth daily before breakfast.  . Multiple Vitamin (MULTI-VITAMINS) TABS Take 1 tablet by mouth daily.  Marland Kitchen  propranolol (INDERAL) 10 MG tablet Take 10 mg by mouth daily.  . sodium chloride (OCEAN) 0.65 % SOLN nasal spray Place 2 sprays into both nostrils every 4 (four) hours.  Marland Kitchen zinc oxide 20 % ointment Apply 1 application topically as needed for irritation. To buttocks after every incontinent episode and as needed for redness. May keep at bedside.  . [DISCONTINUED] aspirin EC 81 MG tablet Take 1 tablet (81 mg total) by mouth daily. Swallow whole.   No facility-administered encounter medications on file as of 09/01/2020.    Review of Systems  Constitutional: Positive for activity change.  HENT: Negative.   Respiratory: Positive for cough.   Cardiovascular: Positive for chest pain.  Gastrointestinal: Negative.   Genitourinary: Negative.   Musculoskeletal: Positive for gait problem.  Skin: Negative.   Neurological: Positive for weakness.  Psychiatric/Behavioral: Negative.     Vitals:   09/01/20 1400  BP: 102/61  Pulse: 96  Resp: 20  Temp: 97.7 F (36.5 C)  SpO2: 91%  Weight: 128 lb 12.8 oz (58.4 kg)  Height: 5\' 6"  (1.676 m)   Body mass index is 20.79 kg/m. Physical Exam  Constitutional: Oriented to person, place, and time. Well-developed and well-nourished.  HENT:  Head: Normocephalic.  Mouth/Throat: Oropharynx is clear and moist.  Eyes: Pupils are equal, round, and reactive to light. Bruise below left eye Neck: Neck supple.  Cardiovascular: Normal rate and normal heart  sounds.  No murmur heard. Pulmonary/Chest: Effort normal and breath sounds normal. No respiratory distress. No wheezes. Few Rales in Left Lower Lobe.  Abdominal: Soft. Bowel sounds are normal. No distension. There is no tenderness. There is no rebound.  Musculoskeletal: No edema.  Lymphadenopathy: none Neurological: Alert and oriented to person, place, and time.  Skin: Skin is warm and dry.  Psychiatric: Normal mood and affect. Behavior is normal. Thought content normal.    Labs reviewed: Basic Metabolic Panel: Recent Labs    06/27/20 1025 08/24/20 2006 08/27/20 0112  NA 139 138 137  K 4.6 4.8 3.5  CL 104 105 102  CO2 31 24 26   GLUCOSE 97 93 99  BUN 20 20 16   CREATININE 0.92 0.76 0.88  CALCIUM 8.7* 8.6* 8.2*   Liver Function Tests: Recent Labs    12/24/19 1331 06/27/20 1025 08/24/20 2006  AST 24 27 31   ALT 11 11 12   ALKPHOS 59 73 62  BILITOT 0.5 0.4 0.5  PROT 7.7 7.5 7.1  ALBUMIN 3.1* 1.8* 1.9*   No results for input(s): LIPASE, AMYLASE in the last 8760 hours. No results for input(s): AMMONIA in the last 8760 hours. CBC: Recent Labs    06/27/20 1025 08/24/20 2006 08/27/20 0112  WBC 35.6* 40.0* 43.8*  NEUTROABS 6.1 8.8* 2.7  HGB 10.7* 11.3* 9.9*  HCT 32.7* 35.1* 29.8*  MCV 101.9* 106.0* 103.5*  PLT 164 168 138*   Cardiac Enzymes: No results for input(s): CKTOTAL, CKMB, CKMBINDEX, TROPONINI in the last 8760 hours. BNP: Invalid input(s): POCBNP No results found for: HGBA1C No results found for: TSH No results found for: VITAMINB12 No results found for: FOLATE No results found for: IRON, TIBC, FERRITIN  Imaging and Procedures obtained prior to SNF admission: DG Ribs Unilateral W/Chest Left  Result Date: 08/24/2020 CLINICAL DATA:  Status post fall. EXAM: LEFT RIBS AND CHEST - 3+ VIEW COMPARISON:  December 28, 2017 FINDINGS: A radiopaque marker was placed at the site of the patient's pain. No fracture or other bone lesions are seen involving the ribs. An  intact right shoulder replacement is noted. Multilevel degenerative changes seen throughout the thoracic spine. There is no evidence of pneumothorax or pleural effusion. Both lungs are clear. Heart size and mediastinal contours are within normal limits. IMPRESSION: 1. No acute rib fracture. Electronically Signed   By: Virgina Norfolk M.D.   On: 08/24/2020 19:51   CT HEAD WO CONTRAST  Result Date: 08/25/2020 CLINICAL DATA:  Follow-up subarachnoid hemorrhage. EXAM: CT HEAD WITHOUT CONTRAST TECHNIQUE: Contiguous axial images were obtained from the base of the skull through the vertex without intravenous contrast. COMPARISON:  08/24/2020 FINDINGS: Brain: Focal acute right frontoparietal subarachnoid hemorrhage with possible adjacent surface parenchymal hemorrhage. No change in appearance or distribution since the previous study. No evidence of significant progression. Unchanged cerebral atrophy and small vessel ischemic change. No mass effect or midline shift. No significant ventricular dilatation. Vascular: Moderate intracranial arterial vascular calcifications. Skull: Calvarium appears intact. Sinuses/Orbits: Paranasal sinuses and mastoid air cells are clear. Other: None. IMPRESSION: 1. Focal acute right frontoparietal subarachnoid hemorrhage with possible adjacent surface parenchymal hemorrhage. No progression or developing complications since previous study. 2. Unchanged cerebral atrophy and small vessel ischemic change. Electronically Signed   By: Lucienne Capers M.D.   On: 08/25/2020 01:19   CT Head Wo Contrast  Result Date: 08/24/2020 CLINICAL DATA:  Status post fall 1 getting female. Possible loss of consciousness. Facial swelling left cheek. Abrasion. EXAM: CT HEAD WITHOUT CONTRAST CT MAXILLOFACIAL WITHOUT CONTRAST CT CERVICAL SPINE WITHOUT CONTRAST TECHNIQUE: Multidetector CT imaging of the head, cervical spine, and maxillofacial structures were performed using the standard protocol without  intravenous contrast. Multiplanar CT image reconstructions of the cervical spine and maxillofacial structures were also generated. COMPARISON:  MRA head 12/12/2017 FINDINGS: CT HEAD FINDINGS Brain: Cerebral ventricle sizes are concordant with the degree of cerebral volume loss. Loss Patchy and confluent areas of decreased attenuation are noted throughout the deep and periventricular white matter of the cerebral hemispheres bilaterally, compatible with chronic microvascular ischemic disease. No evidence of large-territorial acute infarction. No definite parenchymal hemorrhage. No mass lesion. Small moderate volume right frontoparietal sub arachnoid hemorrhage. No mass effect or midline shift. No hydrocephalus. No intraventricular extension of the hemorrhage. Basilar cisterns are patent. Vascular: No hyperdense vessel. Skull: No acute fracture or focal lesion. Other: None. CT MAXILLOFACIAL FINDINGS Osseous: No acute displaced fracture. Sinuses/Orbits: Paranasal sinuses and mastoid air cells are clear. Bilateral lens replacement. Otherwise the orbits are unremarkable. Soft tissues: Left maxillary subcutaneus soft tissue edema with associated 2 x 1 cm sub cutaneous hematoma formation. CT CERVICAL SPINE FINDINGS Alignment: Straightening of the normal cervical lordosis likely due to positioning. Grade 1 anterolisthesis of L3 on L4 and L4 on L5. Skull base and vertebrae: Severe multilevel degenerative changes of the cervical spine with multilevel intervertebral disc space narrowing which is most prominent at the C6-C7 levels. No acute fracture. No aggressive appearing focal osseous lesion or focal pathologic process. Soft tissues and spinal canal: No prevertebral fluid or swelling. No visible canal hematoma. Upper chest: Biapical pleural/pulmonary scarring. Other: None. IMPRESSION: 1. Right frontoparietal subarachnoid hemorrhage. Associated underlying intraparenchymal hemorrhage not excluded. Recommend repeat CT head in 6  hours. 2. No acute facial fracture in a patient with left maxillary subcutaneus soft tissue hematoma formation. 3. No acute displaced fracture or traumatic listhesis of the cervical spine in a patient with severe multilevel degenerative changes. These results were called by telephone at the time of interpretation on 08/24/2020 at 7:46 pm to provider Dr. Alvino Chapel, who verbally acknowledged these  results. Electronically Signed   By: Iven Finn M.D.   On: 08/24/2020 19:53   CT Cervical Spine Wo Contrast  Result Date: 08/24/2020 CLINICAL DATA:  Status post fall 1 getting female. Possible loss of consciousness. Facial swelling left cheek. Abrasion. EXAM: CT HEAD WITHOUT CONTRAST CT MAXILLOFACIAL WITHOUT CONTRAST CT CERVICAL SPINE WITHOUT CONTRAST TECHNIQUE: Multidetector CT imaging of the head, cervical spine, and maxillofacial structures were performed using the standard protocol without intravenous contrast. Multiplanar CT image reconstructions of the cervical spine and maxillofacial structures were also generated. COMPARISON:  MRA head 12/12/2017 FINDINGS: CT HEAD FINDINGS Brain: Cerebral ventricle sizes are concordant with the degree of cerebral volume loss. Loss Patchy and confluent areas of decreased attenuation are noted throughout the deep and periventricular white matter of the cerebral hemispheres bilaterally, compatible with chronic microvascular ischemic disease. No evidence of large-territorial acute infarction. No definite parenchymal hemorrhage. No mass lesion. Small moderate volume right frontoparietal sub arachnoid hemorrhage. No mass effect or midline shift. No hydrocephalus. No intraventricular extension of the hemorrhage. Basilar cisterns are patent. Vascular: No hyperdense vessel. Skull: No acute fracture or focal lesion. Other: None. CT MAXILLOFACIAL FINDINGS Osseous: No acute displaced fracture. Sinuses/Orbits: Paranasal sinuses and mastoid air cells are clear. Bilateral lens replacement.  Otherwise the orbits are unremarkable. Soft tissues: Left maxillary subcutaneus soft tissue edema with associated 2 x 1 cm sub cutaneous hematoma formation. CT CERVICAL SPINE FINDINGS Alignment: Straightening of the normal cervical lordosis likely due to positioning. Grade 1 anterolisthesis of L3 on L4 and L4 on L5. Skull base and vertebrae: Severe multilevel degenerative changes of the cervical spine with multilevel intervertebral disc space narrowing which is most prominent at the C6-C7 levels. No acute fracture. No aggressive appearing focal osseous lesion or focal pathologic process. Soft tissues and spinal canal: No prevertebral fluid or swelling. No visible canal hematoma. Upper chest: Biapical pleural/pulmonary scarring. Other: None. IMPRESSION: 1. Right frontoparietal subarachnoid hemorrhage. Associated underlying intraparenchymal hemorrhage not excluded. Recommend repeat CT head in 6 hours. 2. No acute facial fracture in a patient with left maxillary subcutaneus soft tissue hematoma formation. 3. No acute displaced fracture or traumatic listhesis of the cervical spine in a patient with severe multilevel degenerative changes. These results were called by telephone at the time of interpretation on 08/24/2020 at 7:46 pm to provider Dr. Alvino Chapel, who verbally acknowledged these results. Electronically Signed   By: Iven Finn M.D.   On: 08/24/2020 19:53   CT Maxillofacial Wo Contrast  Result Date: 08/24/2020 CLINICAL DATA:  Status post fall 1 getting female. Possible loss of consciousness. Facial swelling left cheek. Abrasion. EXAM: CT HEAD WITHOUT CONTRAST CT MAXILLOFACIAL WITHOUT CONTRAST CT CERVICAL SPINE WITHOUT CONTRAST TECHNIQUE: Multidetector CT imaging of the head, cervical spine, and maxillofacial structures were performed using the standard protocol without intravenous contrast. Multiplanar CT image reconstructions of the cervical spine and maxillofacial structures were also generated.  COMPARISON:  MRA head 12/12/2017 FINDINGS: CT HEAD FINDINGS Brain: Cerebral ventricle sizes are concordant with the degree of cerebral volume loss. Loss Patchy and confluent areas of decreased attenuation are noted throughout the deep and periventricular white matter of the cerebral hemispheres bilaterally, compatible with chronic microvascular ischemic disease. No evidence of large-territorial acute infarction. No definite parenchymal hemorrhage. No mass lesion. Small moderate volume right frontoparietal sub arachnoid hemorrhage. No mass effect or midline shift. No hydrocephalus. No intraventricular extension of the hemorrhage. Basilar cisterns are patent. Vascular: No hyperdense vessel. Skull: No acute fracture or focal lesion. Other: None. CT  MAXILLOFACIAL FINDINGS Osseous: No acute displaced fracture. Sinuses/Orbits: Paranasal sinuses and mastoid air cells are clear. Bilateral lens replacement. Otherwise the orbits are unremarkable. Soft tissues: Left maxillary subcutaneus soft tissue edema with associated 2 x 1 cm sub cutaneous hematoma formation. CT CERVICAL SPINE FINDINGS Alignment: Straightening of the normal cervical lordosis likely due to positioning. Grade 1 anterolisthesis of L3 on L4 and L4 on L5. Skull base and vertebrae: Severe multilevel degenerative changes of the cervical spine with multilevel intervertebral disc space narrowing which is most prominent at the C6-C7 levels. No acute fracture. No aggressive appearing focal osseous lesion or focal pathologic process. Soft tissues and spinal canal: No prevertebral fluid or swelling. No visible canal hematoma. Upper chest: Biapical pleural/pulmonary scarring. Other: None. IMPRESSION: 1. Right frontoparietal subarachnoid hemorrhage. Associated underlying intraparenchymal hemorrhage not excluded. Recommend repeat CT head in 6 hours. 2. No acute facial fracture in a patient with left maxillary subcutaneus soft tissue hematoma formation. 3. No acute  displaced fracture or traumatic listhesis of the cervical spine in a patient with severe multilevel degenerative changes. These results were called by telephone at the time of interpretation on 08/24/2020 at 7:46 pm to provider Dr. Alvino Chapel, who verbally acknowledged these results. Electronically Signed   By: Iven Finn M.D.   On: 08/24/2020 19:53    Assessment/Plan Pneumonia of left lower lobe due to infectious organism Levaquin 500 mg QD for 7 days  Closed fracture of multiple ribs of left side, initial encounter Pain Control with Tylenol 500 mg Q6 Repeat Chest Xray in 2 weeks Subarachnoid hemorrhage (HCC) Aspirin on hold. Keprra for 10 days Therapy Follow up with Neuro Lymphoma, marginal zone, lymph nodes of multiple sites ([place]) With Elevated WBC and anemia Per Oncology no treatment right now Hypothyroidism, unspecified type On Supplement Will need TSH at some point  Age-related osteoporosis Trigeminal neuralgia On High Doses of Neurontin Migraine On Propanolol and it has helped with his headahes Does nto want to change for low BP right now  Family/ staff Communication:   Labs/tests ordered:

## 2020-09-08 LAB — BASIC METABOLIC PANEL
BUN: 21 (ref 4–21)
CO2: 26 — AB (ref 13–22)
Chloride: 105 (ref 99–108)
Creatinine: 1 (ref 0.5–1.1)
Glucose: 86
Potassium: 4.2 (ref 3.4–5.3)
Sodium: 139 (ref 137–147)

## 2020-09-08 LAB — CBC AND DIFFERENTIAL
HCT: 32 — AB (ref 36–46)
Hemoglobin: 10.4 — AB (ref 12.0–16.0)
Neutrophils Absolute: 3560
Platelets: 170 (ref 150–399)
WBC: 51.6

## 2020-09-08 LAB — TSH: TSH: 27.99 — AB (ref 0.41–5.90)

## 2020-09-08 LAB — CBC: RBC: 3.07 — AB (ref 3.87–5.11)

## 2020-09-08 LAB — COMPREHENSIVE METABOLIC PANEL
Albumin: 1.8 — AB (ref 3.5–5.0)
Calcium: 8 — AB (ref 8.7–10.7)
Globulin: 4.4

## 2020-09-08 LAB — HEPATIC FUNCTION PANEL
ALT: 10 (ref 7–35)
AST: 22 (ref 13–35)
Alkaline Phosphatase: 68 (ref 25–125)
Bilirubin, Total: 0.4

## 2020-09-09 ENCOUNTER — Encounter: Payer: Self-pay | Admitting: Nurse Practitioner

## 2020-09-09 ENCOUNTER — Non-Acute Institutional Stay (SKILLED_NURSING_FACILITY): Payer: Medicare Other | Admitting: Nurse Practitioner

## 2020-09-09 DIAGNOSIS — G43709 Chronic migraine without aura, not intractable, without status migrainosus: Secondary | ICD-10-CM

## 2020-09-09 DIAGNOSIS — E871 Hypo-osmolality and hyponatremia: Secondary | ICD-10-CM

## 2020-09-09 DIAGNOSIS — I639 Cerebral infarction, unspecified: Secondary | ICD-10-CM | POA: Diagnosis not present

## 2020-09-09 DIAGNOSIS — I10 Essential (primary) hypertension: Secondary | ICD-10-CM

## 2020-09-09 DIAGNOSIS — C8588 Other specified types of non-Hodgkin lymphoma, lymph nodes of multiple sites: Secondary | ICD-10-CM | POA: Diagnosis not present

## 2020-09-09 DIAGNOSIS — R269 Unspecified abnormalities of gait and mobility: Secondary | ICD-10-CM

## 2020-09-09 DIAGNOSIS — N750 Cyst of Bartholin's gland: Secondary | ICD-10-CM

## 2020-09-09 DIAGNOSIS — M810 Age-related osteoporosis without current pathological fracture: Secondary | ICD-10-CM

## 2020-09-09 DIAGNOSIS — G500 Trigeminal neuralgia: Secondary | ICD-10-CM

## 2020-09-09 NOTE — Assessment & Plan Note (Signed)
Marginal zone lymphoma, f/u oncology, wbc 40-50s

## 2020-09-09 NOTE — Assessment & Plan Note (Signed)
Hyponatremia Na 138 08/27/20

## 2020-09-09 NOTE — Assessment & Plan Note (Signed)
Trigeminal neuralgia, takes [place]

## 2020-09-09 NOTE — Assessment & Plan Note (Signed)
Migraines, takes Propranolol.

## 2020-09-09 NOTE — Assessment & Plan Note (Signed)
OP DEXA 02/25/20 tscore -3.8

## 2020-09-09 NOTE — Progress Notes (Signed)
Location:   SNF [place] Room Number: 40 Place of Service:  SNF (31) Provider: [facility] Joy Lovick NP  Joy Dad, MD  Patient Care Team: Joy Dad, MD as PCP - General (Internal Medicine)  Extended Emergency Contact Information Primary Emergency Contact: Joy Lynch Address: [address]          [place], [place] [address] Joy Lynch of [place] Phone: [phone] Relation: Alanson Puls Secondary Emergency Contact: Joy Lynch Address: [address]          [place], [place] [address] Joy Lynch of [place] Phone: [phone] Mobile Phone: [phone] Relation: Nephew  Code Status:  DNR Goals of care: Advanced Directive information Advanced Directives 09/01/2020  Does Patient Have a Medical Advance Directive? Yes  Type of Paramedic of [place];Living will  Does patient want to make changes to medical advance directive? No - Patient declined  Copy of [place] in Chart? Yes - validated most recent copy scanned in chart (See row information)  Would patient like information on creating a medical advance directive? -     Chief Complaint  Patient presents with  . Acute Visit    Elevated TSH    HPI:  Pt is a 86 y.o. female seen today for an acute visit for elevated TSH 27 09/08/20, the patient denied unexplained fatigue, increased sensitivity to cold, constipation, dry skin, weight gain, puffy face, hoarseness, muscle aches, tenderness, or stiffness, change in mood or memory.   Hospital stay 08/24/20-08/29/20 for subarachnoid hemorrhage/intraparenchymal hemorrhage sustained from a mechanical fall at [facility] when she walked to the dinning room and tripped and fell on her face, f/u neurosurgery in 2 weeks Dr. Duffy Rhody. X-ray left chest/ribs showed no acute fxs. CT head showed right frontoparietal subarachnoid hemorrhage/intraparenchymal hemorrhage. Started Keppra 556m bid x 2weeks, resume ASA after 2 weeks.               Marginal zone lymphoma, f/u oncology, wbc 40-50s             Hyponatremia Na 138 08/27/20             Hypothyroidism, takes Levothyroxine              Chronic left cerebellar infarct, hx of              Gait abnormality, therapy.              OP DEXA 02/25/20 tscore -3.8             Migraines, takes Propranolol.              HTN recommended MAP range 70-90, Bun/creat 16/0.88 08/27/20             Trigeminal neuralgia, takes Gabpantin             Bartholin gland cysts on each labia   Past Medical History:  Diagnosis Date  . Cancer ([facility]    non hodgkin lymphoma  . [place] lymphocytic [place]   . History of B-cell lymphoma 11/02/2015  . Migraines   . Osteoporosis   . Thyroid disease   . Trigeminal neuralgia of left side of face    Past Surgical History:  Procedure Laterality Date  . gamma knife     for trigeminal neuralgia  . SHOULDER SURGERY     Right shoulder replacement  . TONSILLECTOMY AND ADENOIDECTOMY    . WISDOM TOOTH EXTRACTION      Allergies  Allergen Reactions  . Penicillins Hives  . Carbamazepine Other (See Comments) and Hives    Turned purple from neck down and drowsy Other reaction(s): Other (See Comments) Turned purple from neck down and drowsy  . Pregabalin Other (See Comments)    Drowsy Other reaction(s): Confusion (intolerance) Drowsy    Allergies as of 09/09/2020      Reactions   Penicillins Hives   Carbamazepine Other (See Comments), Hives   Turned purple from neck down and drowsy Other reaction(s): Other (See Comments) Turned purple from neck down and drowsy   Pregabalin Other (See Comments)   Drowsy Other reaction(s): Confusion (intolerance) Drowsy      Medication List       Accurate as of September 09, 2020 11:59 PM. If you have any questions, ask your nurse or doctor.        STOP taking these medications   levETIRAcetam 500 MG tablet Commonly known as: KEPPRA Stopped by: Drezden Seitzinger X Kaily Wragg, NP     TAKE these medications    acetaminophen 500 MG tablet Commonly known as: TYLENOL Take 500 mg by mouth every 6 (six) hours.   aspirin 81 MG chewable tablet Chew 81 mg by mouth daily.   Biotin 10 MG Caps Take 10 mg by mouth daily.   calcium-vitamin D 500-200 MG-UNIT tablet Commonly known as: OSCAL WITH D Take 1 tablet by mouth daily.   gabapentin 800 MG tablet Commonly known as: NEURONTIN Take 800 mg by mouth 3 (three) times daily.   hydroxypropyl methylcellulose / hypromellose 2.5 % ophthalmic solution Commonly known as: ISOPTO TEARS / GONIOVISC Place 1 drop into both eyes 4 (four) times daily as needed for dry eyes.   levothyroxine 75 MCG tablet Commonly known as: SYNTHROID Take 75 mcg by mouth daily before breakfast.   Multi-Vitamins Tabs Take 1 tablet by mouth daily.   polyvinyl alcohol 1.4 % ophthalmic solution Commonly known as: LIQUIFILM TEARS Place 1 drop into both eyes 4 (four) times daily.   propranolol 10 MG tablet Commonly known as: INDERAL Take 10 mg by mouth daily.   saccharomyces boulardii 250 MG capsule Commonly known as: FLORASTOR Take 250 mg by mouth 2 (two) times daily.   sodium chloride 0.65 % Soln nasal spray Commonly known as: OCEAN Place 2 sprays into both nostrils every 4 (four) hours.   zinc oxide 20 % ointment Apply 1 application topically as needed for irritation. To buttocks after every incontinent episode and as needed for redness. May keep at bedside.       Review of Systems  Constitutional: Negative for activity change, appetite change, fatigue and fever.  HENT: Positive for hearing loss. Negative for congestion, sinus pain and sore throat.   Eyes: Negative for visual disturbance.       Dry eyes  Respiratory: Negative for cough, chest tightness, shortness of breath and wheezing.   Cardiovascular: Positive for leg swelling. Negative for chest pain and palpitations.  Gastrointestinal: Negative for abdominal pain, constipation, nausea and vomiting.   Genitourinary: Negative for difficulty urinating, dysuria, frequency, hematuria and urgency.       Bartholin cysts.   Musculoskeletal: Positive for arthralgias and gait problem.  Skin: Positive for color change.       Left facial/neck bruise.   Neurological: Negative for dizziness, seizures, facial asymmetry, speech difficulty, weakness and headaches.  Hematological: Bruises/bleeds easily.  Psychiatric/Behavioral: Negative for agitation, behavioral problems, confusion, hallucinations and sleep disturbance. The patient is not nervous/anxious.     Immunization History  Administered Date(s) Administered  . Fluad Quad(high Dose 65+) 05/28/2019  . Influenza, High Dose Seasonal PF 06/11/2017, 06/28/2018  . Influenza-Unspecified 06/21/2020  . Moderna Sars-Covid-2 Vaccination 10/05/2019, 11/02/2019, 08/15/2020  . Tdap 12/28/2017, 07/01/2018  . Zoster Recombinat (Shingrix) 07/01/2018   Pertinent  Health Maintenance Due  Topic Date Due  . PNA vac Low Risk Adult (1 of 2 - PCV13) Never done  . INFLUENZA VACCINE  Completed  . DEXA SCAN  Completed   No flowsheet data found. Functional Status Survey:    Vitals:   09/09/20 1326  BP: (!) 92/59  Pulse: 74  Resp: 18  Temp: (!) 97.1 F (36.2 C)  SpO2: 96%  Weight: 128 lb 9.6 oz (58.3 kg)  Height: _0  (1.676 m)   Body mass index is 20.76 kg/m. Physical Exam Vitals and nursing note reviewed.  Constitutional:      General: She is not in acute distress.    Appearance: Normal appearance. She is not ill-appearing, toxic-appearing or diaphoretic.  HENT:     Head:     Comments: Left facial/neck bruise    Nose: Nose normal.     Mouth/Throat:     Mouth: Mucous membranes are moist.  Eyes:     Extraocular Movements: Extraocular movements intact.     Conjunctiva/sclera: Conjunctivae normal.     Pupils: Pupils are equal, round, and reactive to light.  Cardiovascular:     Rate and Rhythm: Normal rate and regular rhythm.     Heart sounds:  No murmur heard.   Pulmonary:     Effort: Pulmonary effort is normal.     Breath sounds: Normal breath sounds. No wheezing, rhonchi or rales.  Abdominal:     General: Bowel sounds are normal. There is no distension.     Palpations: Abdomen is soft.     Tenderness: There is no abdominal tenderness. There is no right CVA tenderness, left CVA tenderness, guarding or rebound.  Musculoskeletal:     Cervical back: Normal range of motion and neck supple.     Right lower leg: Edema present.     Left lower leg: Edema present.     Comments: Trace edema BLE. Left chest wall pain palpated and also with deep breathing/movment.   Skin:    General: Skin is warm and dry.     Findings: Bruising present.     Comments: Left facial/neck bruise resolving.   Neurological:     General: No focal deficit present.     Mental Status: She is alert and oriented to person, place, and time. Mental status is at baseline.     Gait: Gait abnormal.  Psychiatric:        Mood and Affect: Mood normal.        Behavior: Behavior normal.        Thought Content: Thought content normal.        Judgment: Judgment normal.     Labs reviewed: Recent Labs    06/27/20 1025 08/24/20 2006 08/27/20 0112  NA 139 138 137  K 4.6 4.8 3.5  CL 104 105 102  CO2 _1 GLUCOSE 97 93 99  BUN _2 CREATININE 0.92 0.76 0.88  CALCIUM 8.7* 8.6* 8.2*   Recent Labs    12/24/19 1331 06/27/20 1025 08/24/20 2006  AST _3 ALT _4 ALKPHOS 59 73 62  BILITOT 0.5 0.4 0.5  PROT 7.7 7.5 7.1  ALBUMIN 3.1* 1.8* 1.9*  Recent Labs    06/27/20 1025 08/24/20 2006 08/27/20 0112  WBC 35.6* 40.0* 43.8*  NEUTROABS 6.1 8.8* 2.7  HGB 10.7* 11.3* 9.9*  HCT 32.7* 35.1* 29.8*  MCV 101.9* 106.0* 103.5*  PLT 164 168 138*   No results found for: TSH No results found for: HGBA1C No results found for: CHOL, HDL, LDLCALC, LDLDIRECT, TRIG, CHOLHDL  Significant Diagnostic Results in last 30 days:  DG Ribs Unilateral  W/Chest Left  Result Date: 08/24/2020 CLINICAL DATA:  Status post fall. EXAM: LEFT RIBS AND CHEST - 3+ VIEW COMPARISON:  December 28, 2017 FINDINGS: A radiopaque marker was placed at the site of the patient's pain. No fracture or other bone lesions are seen involving the ribs. An intact right shoulder replacement is noted. Multilevel degenerative changes seen throughout the thoracic spine. There is no evidence of pneumothorax or pleural effusion. Both lungs are clear. Heart size and mediastinal contours are within normal limits. IMPRESSION: 1. No acute rib fracture. Electronically Signed   By: Virgina Norfolk M.D.   On: 08/24/2020 19:51   CT HEAD WO CONTRAST  Result Date: 08/25/2020 CLINICAL DATA:  Follow-up subarachnoid hemorrhage. EXAM: CT HEAD WITHOUT CONTRAST TECHNIQUE: Contiguous axial images were obtained from the base of the skull through the vertex without intravenous contrast. COMPARISON:  08/24/2020 FINDINGS: Brain: Focal acute right frontoparietal subarachnoid hemorrhage with possible adjacent surface parenchymal hemorrhage. No change in appearance or distribution since the previous study. No evidence of significant progression. Unchanged cerebral atrophy and small vessel ischemic change. No mass effect or midline shift. No significant ventricular dilatation. Vascular: Moderate intracranial arterial vascular calcifications. Skull: Calvarium appears intact. Sinuses/Orbits: Paranasal sinuses and mastoid air cells are clear. Other: None. IMPRESSION: 1. Focal acute right frontoparietal subarachnoid hemorrhage with possible adjacent surface parenchymal hemorrhage. No progression or developing complications since previous study. 2. Unchanged cerebral atrophy and small vessel ischemic change. Electronically Signed   By: Lucienne Capers M.D.   On: 08/25/2020 01:19   CT Head Wo Contrast  Result Date: 08/24/2020 CLINICAL DATA:  Status post fall 1 getting female. Possible loss of consciousness. Facial  swelling left cheek. Abrasion. EXAM: CT HEAD WITHOUT CONTRAST CT MAXILLOFACIAL WITHOUT CONTRAST CT CERVICAL SPINE WITHOUT CONTRAST TECHNIQUE: Multidetector CT imaging of the head, cervical spine, and maxillofacial structures were performed using the standard protocol without intravenous contrast. Multiplanar CT image reconstructions of the cervical spine and maxillofacial structures were also generated. COMPARISON:  MRA head 12/12/2017 FINDINGS: CT HEAD FINDINGS Brain: Cerebral ventricle sizes are concordant with the degree of cerebral volume loss. Loss Patchy and confluent areas of decreased attenuation are noted throughout the deep and periventricular white matter of the cerebral hemispheres bilaterally, compatible with chronic microvascular ischemic disease. No evidence of large-territorial acute infarction. No definite parenchymal hemorrhage. No mass lesion. Small moderate volume right frontoparietal sub arachnoid hemorrhage. No mass effect or midline shift. No hydrocephalus. No intraventricular extension of the hemorrhage. Basilar cisterns are patent. Vascular: No hyperdense vessel. Skull: No acute fracture or focal lesion. Other: None. CT MAXILLOFACIAL FINDINGS Osseous: No acute displaced fracture. Sinuses/Orbits: Paranasal sinuses and mastoid air cells are clear. Bilateral lens replacement. Otherwise the orbits are unremarkable. Soft tissues: Left maxillary subcutaneus soft tissue edema with associated 2 x 1 cm sub cutaneous hematoma formation. CT CERVICAL SPINE FINDINGS Alignment: Straightening of the normal cervical lordosis likely due to positioning. Grade 1 anterolisthesis of L3 on L4 and L4 on L5. Skull base and vertebrae: Severe multilevel degenerative changes of the cervical spine with multilevel  intervertebral disc space narrowing which is most prominent at the C6-C7 levels. No acute fracture. No aggressive appearing focal osseous lesion or focal pathologic process. Soft tissues and spinal canal: No  prevertebral fluid or swelling. No visible canal hematoma. Upper chest: Biapical pleural/pulmonary scarring. Other: None. IMPRESSION: 1. Right frontoparietal subarachnoid hemorrhage. Associated underlying intraparenchymal hemorrhage not excluded. Recommend repeat CT head in 6 hours. 2. No acute facial fracture in a patient with left maxillary subcutaneus soft tissue hematoma formation. 3. No acute displaced fracture or traumatic listhesis of the cervical spine in a patient with severe multilevel degenerative changes. These results were called by telephone at the time of interpretation on 08/24/2020 at 7:46 pm to provider Dr. Alvino Chapel, who verbally acknowledged these results. Electronically Signed   By: Iven Finn M.D.   On: 08/24/2020 19:53   CT Cervical Spine Wo Contrast  Result Date: 08/24/2020 CLINICAL DATA:  Status post fall 1 getting female. Possible loss of consciousness. Facial swelling left cheek. Abrasion. EXAM: CT HEAD WITHOUT CONTRAST CT MAXILLOFACIAL WITHOUT CONTRAST CT CERVICAL SPINE WITHOUT CONTRAST TECHNIQUE: Multidetector CT imaging of the head, cervical spine, and maxillofacial structures were performed using the standard protocol without intravenous contrast. Multiplanar CT image reconstructions of the cervical spine and maxillofacial structures were also generated. COMPARISON:  MRA head 12/12/2017 FINDINGS: CT HEAD FINDINGS Brain: Cerebral ventricle sizes are concordant with the degree of cerebral volume loss. Loss Patchy and confluent areas of decreased attenuation are noted throughout the deep and periventricular white matter of the cerebral hemispheres bilaterally, compatible with chronic microvascular ischemic disease. No evidence of large-territorial acute infarction. No definite parenchymal hemorrhage. No mass lesion. Small moderate volume right frontoparietal sub arachnoid hemorrhage. No mass effect or midline shift. No hydrocephalus. No intraventricular extension of the  hemorrhage. Basilar cisterns are patent. Vascular: No hyperdense vessel. Skull: No acute fracture or focal lesion. Other: None. CT MAXILLOFACIAL FINDINGS Osseous: No acute displaced fracture. Sinuses/Orbits: Paranasal sinuses and mastoid air cells are clear. Bilateral lens replacement. Otherwise the orbits are unremarkable. Soft tissues: Left maxillary subcutaneus soft tissue edema with associated 2 x 1 cm sub cutaneous hematoma formation. CT CERVICAL SPINE FINDINGS Alignment: Straightening of the normal cervical lordosis likely due to positioning. Grade 1 anterolisthesis of L3 on L4 and L4 on L5. Skull base and vertebrae: Severe multilevel degenerative changes of the cervical spine with multilevel intervertebral disc space narrowing which is most prominent at the C6-C7 levels. No acute fracture. No aggressive appearing focal osseous lesion or focal pathologic process. Soft tissues and spinal canal: No prevertebral fluid or swelling. No visible canal hematoma. Upper chest: Biapical pleural/pulmonary scarring. Other: None. IMPRESSION: 1. Right frontoparietal subarachnoid hemorrhage. Associated underlying intraparenchymal hemorrhage not excluded. Recommend repeat CT head in 6 hours. 2. No acute facial fracture in a patient with left maxillary subcutaneus soft tissue hematoma formation. 3. No acute displaced fracture or traumatic listhesis of the cervical spine in a patient with severe multilevel degenerative changes. These results were called by telephone at the time of interpretation on 08/24/2020 at 7:46 pm to provider Dr. Alvino Chapel, who verbally acknowledged these results. Electronically Signed   By: Iven Finn M.D.   On: 08/24/2020 19:53   CT Maxillofacial Wo Contrast  Result Date: 08/24/2020 CLINICAL DATA:  Status post fall 1 getting female. Possible loss of consciousness. Facial swelling left cheek. Abrasion. EXAM: CT HEAD WITHOUT CONTRAST CT MAXILLOFACIAL WITHOUT CONTRAST CT CERVICAL SPINE WITHOUT  CONTRAST TECHNIQUE: Multidetector CT imaging of the head, cervical spine, and maxillofacial structures  were performed using the standard protocol without intravenous contrast. Multiplanar CT image reconstructions of the cervical spine and maxillofacial structures were also generated. COMPARISON:  MRA head 12/12/2017 FINDINGS: CT HEAD FINDINGS Brain: Cerebral ventricle sizes are concordant with the degree of cerebral volume loss. Loss Patchy and confluent areas of decreased attenuation are noted throughout the deep and periventricular white matter of the cerebral hemispheres bilaterally, compatible with chronic microvascular ischemic disease. No evidence of large-territorial acute infarction. No definite parenchymal hemorrhage. No mass lesion. Small moderate volume right frontoparietal sub arachnoid hemorrhage. No mass effect or midline shift. No hydrocephalus. No intraventricular extension of the hemorrhage. Basilar cisterns are patent. Vascular: No hyperdense vessel. Skull: No acute fracture or focal lesion. Other: None. CT MAXILLOFACIAL FINDINGS Osseous: No acute displaced fracture. Sinuses/Orbits: Paranasal sinuses and mastoid air cells are clear. Bilateral lens replacement. Otherwise the orbits are unremarkable. Soft tissues: Left maxillary subcutaneus soft tissue edema with associated 2 x 1 cm sub cutaneous hematoma formation. CT CERVICAL SPINE FINDINGS Alignment: Straightening of the normal cervical lordosis likely due to positioning. Grade 1 anterolisthesis of L3 on L4 and L4 on L5. Skull base and vertebrae: Severe multilevel degenerative changes of the cervical spine with multilevel intervertebral disc space narrowing which is most prominent at the C6-C7 levels. No acute fracture. No aggressive appearing focal osseous lesion or focal pathologic process. Soft tissues and spinal canal: No prevertebral fluid or swelling. No visible canal hematoma. Upper chest: Biapical pleural/pulmonary scarring. Other: None.  IMPRESSION: 1. Right frontoparietal subarachnoid hemorrhage. Associated underlying intraparenchymal hemorrhage not excluded. Recommend repeat CT head in 6 hours. 2. No acute facial fracture in a patient with left maxillary subcutaneus soft tissue hematoma formation. 3. No acute displaced fracture or traumatic listhesis of the cervical spine in a patient with severe multilevel degenerative changes. These results were called by telephone at the time of interpretation on 08/24/2020 at 7:46 pm to provider Dr. Alvino Chapel, who verbally acknowledged these results. Electronically Signed   By: Iven Finn M.D.   On: 08/24/2020 19:53    Assessment/Plan Hypothyroidism 09/08/20 wbc 51.6,  Hgb 10.4, plt 140, neutrophils 6.9, lymphocytes [phone]), monocytes [phone]). Na 139, K 4.2, Bun 21, creat 0.98, eGFR 50. TSH 27.99 09/09/20 increase Levothyroxine   Lymphoma, marginal zone, lymph nodes of multiple sites ([place]) Marginal zone lymphoma, f/u oncology, wbc 40-50s   Hyponatremia Hyponatremia Na 138 08/27/20   Cerebellar infarct (H[facility]) Chronic left cerebellar infarct, hx of  Gait abnormality Gait abnormality, therapy.    Osteoporosis OP DEXA 02/25/20 tscore -3.8   Migraines Migraines, takes Propranolol.    HTN (hypertension)  HTN recommended MAP range 70-90, Bun/creat 16/0.88 08/27/20   Trigeminal neuralgia Trigeminal neuralgia, takes Gabpantin   Bartholin gland cyst Bartholin gland cysts on each labia per report    Family/ staff Communication: plan of care reviewed with the patient and charge nurse.   Labs/tests ordered:  TSH 6 weeks.   Time spend 35 minutes.

## 2020-09-09 NOTE — Assessment & Plan Note (Signed)
Bartholin gland cysts on each labia per report

## 2020-09-09 NOTE — Assessment & Plan Note (Signed)
09/08/20 wbc 51.6,  Hgb 10.4, plt 140, neutrophils 6.9, lymphocytes [phone]), monocytes [phone]). Na 139, K 4.2, Bun 21, creat 0.98, eGFR 50. TSH 27.99 09/09/20 increase Levothyroxine

## 2020-09-09 NOTE — Assessment & Plan Note (Signed)
Chronic left cerebellar infarct, hx of

## 2020-09-09 NOTE — Assessment & Plan Note (Signed)
HTN recommended MAP range 70-90, Bun/creat 16/0.88 08/27/20

## 2020-09-09 NOTE — Assessment & Plan Note (Signed)
Gait abnormality, therapy.

## 2020-09-12 ENCOUNTER — Encounter: Payer: Self-pay | Admitting: Nurse Practitioner

## 2020-09-13 ENCOUNTER — Encounter: Payer: Self-pay | Admitting: Nurse Practitioner

## 2020-09-13 ENCOUNTER — Non-Acute Institutional Stay (SKILLED_NURSING_FACILITY): Payer: Medicare Other | Admitting: Nurse Practitioner

## 2020-09-13 DIAGNOSIS — N750 Cyst of Bartholin's gland: Secondary | ICD-10-CM

## 2020-09-13 DIAGNOSIS — J189 Pneumonia, unspecified organism: Secondary | ICD-10-CM

## 2020-09-13 DIAGNOSIS — S2242XD Multiple fractures of ribs, left side, subsequent encounter for fracture with routine healing: Secondary | ICD-10-CM | POA: Diagnosis not present

## 2020-09-13 DIAGNOSIS — E039 Hypothyroidism, unspecified: Secondary | ICD-10-CM

## 2020-09-13 DIAGNOSIS — E871 Hypo-osmolality and hyponatremia: Secondary | ICD-10-CM

## 2020-09-13 DIAGNOSIS — I10 Essential (primary) hypertension: Secondary | ICD-10-CM

## 2020-09-13 DIAGNOSIS — R269 Unspecified abnormalities of gait and mobility: Secondary | ICD-10-CM

## 2020-09-13 DIAGNOSIS — C8588 Other specified types of non-Hodgkin lymphoma, lymph nodes of multiple sites: Secondary | ICD-10-CM | POA: Diagnosis not present

## 2020-09-13 DIAGNOSIS — I609 Nontraumatic subarachnoid hemorrhage, unspecified: Secondary | ICD-10-CM | POA: Diagnosis not present

## 2020-09-13 DIAGNOSIS — G500 Trigeminal neuralgia: Secondary | ICD-10-CM

## 2020-09-13 DIAGNOSIS — M810 Age-related osteoporosis without current pathological fracture: Secondary | ICD-10-CM

## 2020-09-13 DIAGNOSIS — G43709 Chronic migraine without aura, not intractable, without status migrainosus: Secondary | ICD-10-CM

## 2020-09-13 DIAGNOSIS — I639 Cerebral infarction, unspecified: Secondary | ICD-10-CM

## 2020-09-13 DIAGNOSIS — S2232XA Fracture of one rib, left side, initial encounter for closed fracture: Secondary | ICD-10-CM | POA: Insufficient documentation

## 2020-09-13 NOTE — Assessment & Plan Note (Signed)
Bartholin gland cysts on each labia per report.

## 2020-09-13 NOTE — Assessment & Plan Note (Signed)
HTN recommended MAP range 70-90, Bun/creat 16/0.88 08/27/20

## 2020-09-13 NOTE — Assessment & Plan Note (Signed)
Hypothyroidism, takes Levothyroxine. 

## 2020-09-13 NOTE — Assessment & Plan Note (Signed)
Closed fracture of multiple ribs left side identified [facility], pending repeated X-ray 2 weeks, pain is controlled with Tylenol.

## 2020-09-13 NOTE — Assessment & Plan Note (Signed)
Chronic left cerebellar infarct, hx of

## 2020-09-13 NOTE — Assessment & Plan Note (Signed)
Trigeminal neuralgia, takes [place]

## 2020-09-13 NOTE — Assessment & Plan Note (Signed)
Gait abnormality, therapy.

## 2020-09-13 NOTE — Progress Notes (Signed)
Location:    [facility] Room Number: 36 Place of Service:  SNF (31)  Provider: Marlana Latus NP  PCP: Virgie Dad, MD Patient Care Team: Virgie Dad, MD as PCP - General (Internal Medicine)  Extended Emergency Contact Information Primary Emergency Contact: Genia Plants Address: [address]          [place], [place] [address] Johnnette Litter of [place] Phone: [phone] Relation: Nephew Secondary Emergency Contact: [name] Address: [address]          [place], [place] [address] Johnnette Litter of [place] Phone: [phone] Mobile Phone: [phone] Relation: Nephew  Code Status: DNR Goals of care:  Advanced Directive information Advanced Directives 09/01/2020  Does Patient Have a Medical Advance Directive? Yes  Type of Paramedic of [place];Living will  Does patient want to make changes to medical advance directive? No - Patient declined  Copy of [place] in Chart? Yes - validated most recent copy scanned in chart (See row information)  Would patient like information on creating a medical advance directive? -     Allergies  Allergen Reactions   Penicillins Hives   Carbamazepine Other (See Comments) and Hives    Turned purple from neck down and drowsy Other reaction(s): Other (See Comments) Turned purple from neck down and drowsy   Pregabalin Other (See Comments)    Drowsy Other reaction(s): Confusion (intolerance) Drowsy    Chief Complaint  Patient presents with   Discharge Note    Discharge from SNF    HPI:  86 y.o. female with history of lymphoma, OP, HTN, hypothyroidism, trigeminal neuralgia, migraine was admitted to [facility] for therapy following hospital stay 1124/21-11/29/21 for subarachnoid hemorrhage/intraparenchymal hemorrhage, f/u neurology 09/12/20, completed Keppra bid x 2 weeks, ASA restarted. The patient has regained physical strength, ADL function,  hemodynamically stable to return to [facility] for living, continuation of therapy.   Her SNF [facility] stay was complicated with left lower lobe pneumonia, fully treated with 7 day course of Levaquin started 09/01/20  Closed fracture of multiple ribs left side identified SNF [facility], pending repeated X-ray 2 weeks, pain is controlled with Tylenol.   Marginal zone lymphoma, f/u oncology, wbc 40s              Hyponatremia Na 138 08/27/20             Hypothyroidism, takes Levothyroxine              Chronic left cerebellar infarct, hx of              Gait abnormality, therapy.              OP DEXA 02/25/20 tscore -3.8             Migraines, takes Propranolol.              HTN recommended MAP range 70-90, Bun/creat 16/0.88 08/27/20             Trigeminal neuralgia, takes Gabpantin             Bartholin gland cysts on each labia per report.     Past Medical History:  Diagnosis Date   Cancer [facility])    non hodgkin lymphoma   Chronic lymphocytic leukemia (HCC)    History of B-cell lymphoma 11/02/2015   Migraines    Osteoporosis    Thyroid disease    Trigeminal neuralgia of left side of face  Past Surgical History:  Procedure Laterality Date   gamma knife     for trigeminal neuralgia   SHOULDER SURGERY     Right shoulder replacement   TONSILLECTOMY AND ADENOIDECTOMY     WISDOM TOOTH EXTRACTION        reports that she quit smoking about 44 years ago. She has a 10.00 pack-year smoking history. She has never used smokeless tobacco. She reports current alcohol use of about 2.0 standard drinks of alcohol per week. She reports that she does not use drugs. Social History   Socioeconomic History   Marital status: Divorced    Spouse name: Not on file   Number of children: 0   Years of education: Masters   Highest education level: Not on file  Occupational History   Occupation: Retired  Tobacco Use   Smoking status: Former Smoker    Packs/day: 0.50    Years: 20.00    Pack years:  10.00    Quit date: 10/02/1975    Years since quitting: 44.9   Smokeless tobacco: Never Used  Substance and Sexual Activity   Alcohol use: Yes    Alcohol/week: 2.0 standard drinks    Types: 2 Glasses of wine per week   Drug use: No   Sexual activity: Never    Comment: divorced, no children, sister in law next of kin. Retired Actuary for [employer]  Other Topics Concern   Not on file  Social History Narrative   Lives at home alone.   Right-handed.   4 cups caffeine per day.   Social Determinants of Health   Financial Resource Strain: Not on file  Food Insecurity: Not on file  Transportation Needs: Not on file  Physical Activity: Not on file  Stress: Not on file  Social Connections: Not on file  Intimate Partner Violence: Not on file   Functional Status Survey:    Allergies  Allergen Reactions   Penicillins Hives   Carbamazepine Other (See Comments) and Hives    Turned purple from neck down and drowsy Other reaction(s): Other (See Comments) Turned purple from neck down and drowsy   Pregabalin Other (See Comments)    Drowsy Other reaction(s): Confusion (intolerance) Drowsy    Pertinent  Health Maintenance Due  Topic Date Due   PNA vac Low Risk Adult (1 of 2 - PCV13) Never done   INFLUENZA VACCINE  Completed   DEXA SCAN  Completed    Medications: Allergies as of 09/13/2020      Reactions   Penicillins Hives   Carbamazepine Other (See Comments), Hives   Turned purple from neck down and drowsy Other reaction(s): Other (See Comments) Turned purple from neck down and drowsy   Pregabalin Other (See Comments)   Drowsy Other reaction(s): Confusion (intolerance) Drowsy      Medication List       Accurate as of September 13, 2020  4:33 PM. If you have any questions, ask your nurse or doctor.        STOP taking these medications   saccharomyces boulardii 250 MG capsule Commonly known as: FLORASTOR Stopped by: Brooklyne Radke X Seaver Machia, NP     TAKE these  medications   acetaminophen 500 MG tablet Commonly known as: TYLENOL Take 500 mg by mouth every 6 (six) hours.   aspirin 81 MG chewable tablet Chew 81 mg by mouth daily.   Biotin 10 MG Caps Take 10 mg by mouth daily.   calcium-vitamin D 500-200 MG-UNIT tablet Commonly known as: OSCAL WITH  D Take 1 tablet by mouth daily.   gabapentin 800 MG tablet Commonly known as: NEURONTIN Take 800 mg by mouth 3 (three) times daily.   hydroxypropyl methylcellulose / hypromellose 2.5 % ophthalmic solution Commonly known as: ISOPTO TEARS / GONIOVISC Place 1 drop into both eyes 4 (four) times daily as needed for dry eyes.   levothyroxine 100 MCG tablet Commonly known as: SYNTHROID Take 100 mcg by mouth daily before breakfast.   Multi-Vitamins Tabs Take 1 tablet by mouth daily.   polyvinyl alcohol 1.4 % ophthalmic solution Commonly known as: LIQUIFILM TEARS Place 1 drop into both eyes 4 (four) times daily.   propranolol 10 MG tablet Commonly known as: INDERAL Take 10 mg by mouth daily.   sodium chloride 0.65 % Soln nasal spray Commonly known as: OCEAN Place 2 sprays into both nostrils every 4 (four) hours.   zinc oxide 20 % ointment Apply 1 application topically as needed for irritation. To buttocks after every incontinent episode and as needed for redness. May keep at bedside.       Review of Systems  Constitutional: Negative for activity change, appetite change and fever.  HENT: Positive for hearing loss. Negative for congestion, sinus pain and sore throat.   Eyes: Negative for visual disturbance.       Dry eyes  Respiratory: Negative for cough, chest tightness, shortness of breath and wheezing.   Cardiovascular: Positive for leg swelling. Negative for chest pain and palpitations.  Gastrointestinal: Negative for abdominal pain and constipation.  Genitourinary: Negative for difficulty urinating, dysuria, frequency and urgency.       Bartholin cysts.   Musculoskeletal: Positive  for arthralgias and gait problem.       Ambulates with walker.   Skin: Negative for color change.  Neurological: Negative for seizures, facial asymmetry, speech difficulty, weakness, light-headedness and headaches.  Hematological: Bruises/bleeds easily.  Psychiatric/Behavioral: Negative for agitation, behavioral problems, confusion, hallucinations and sleep disturbance. The patient is not nervous/anxious.     Vitals:   09/13/20 1447  BP: (!) 99/56  Pulse: 83  Resp: 16  Temp: 97.6 F (36.4 C)  SpO2: 96%  Weight: 128 lb 9.6 oz (58.3 kg)  Height: 5\' 6"  (1.676 m)   Body mass index is 20.76 kg/m. Physical Exam Vitals and nursing note reviewed.  Constitutional:      Appearance: Normal appearance.  HENT:     Head:     Comments: Left facial/neck bruise    Nose: Nose normal.     Mouth/Throat:     Mouth: Mucous membranes are moist.  Eyes:     Extraocular Movements: Extraocular movements intact.     Conjunctiva/sclera: Conjunctivae normal.     Pupils: Pupils are equal, round, and reactive to light.  Cardiovascular:     Rate and Rhythm: Normal rate and regular rhythm.     Heart sounds: No murmur heard.   Pulmonary:     Effort: Pulmonary effort is normal.     Breath sounds: Normal breath sounds. No wheezing, rhonchi or rales.  Abdominal:     General: Bowel sounds are normal.     Palpations: Abdomen is soft.     Tenderness: There is no abdominal tenderness.  Musculoskeletal:     Cervical back: Normal range of motion and neck supple.     Right lower leg: Edema present.     Left lower leg: Edema present.     Comments: 1+ edema BLE. Left chest wall pain palpated and also with deep breathing/movment.  Skin:    General: Skin is warm and dry.  Neurological:     General: No focal deficit present.     Mental Status: She is alert and oriented to person, place, and time. Mental status is at baseline.     Gait: Gait abnormal.  Psychiatric:        Mood and Affect: Mood normal.         Behavior: Behavior normal.        Thought Content: Thought content normal.        Judgment: Judgment normal.     Labs reviewed: Basic Metabolic Panel: Recent Labs    06/27/20 1025 08/24/20 2006 08/27/20 0112 09/08/20 0000  NA 139 138 137 139  K 4.6 4.8 3.5 4.2  CL 104 105 102 105  CO2 31 24 26  26*  GLUCOSE 97 93 99  --   BUN 20 20 16 21   CREATININE 0.92 0.76 0.88 1.0  CALCIUM 8.7* 8.6* 8.2* 8.0*   Liver Function Tests: Recent Labs    12/24/19 1331 06/27/20 1025 08/24/20 2006 09/08/20 0000  AST 24 27 31 22   ALT 11 11 12 10   ALKPHOS 59 73 62 68  BILITOT 0.5 0.4 0.5  --   PROT 7.7 7.5 7.1  --   ALBUMIN 3.1* 1.8* 1.9* 1.8*   No results for input(s): LIPASE, AMYLASE in the last 8760 hours. No results for input(s): AMMONIA in the last 8760 hours. CBC: Recent Labs    06/27/20 1025 08/24/20 2006 08/27/20 0112 09/08/20 0000  WBC 35.6* 40.0* 43.8* 51.6  NEUTROABS 6.1 8.8* 2.7 3,560.00  HGB 10.7* 11.3* 9.9* 10.4*  HCT 32.7* 35.1* 29.8* 32*  MCV 101.9* 106.0* 103.5*  --   PLT 164 168 138* 170   Cardiac Enzymes: No results for input(s): CKTOTAL, CKMB, CKMBINDEX, TROPONINI in the last 8760 hours. BNP: Invalid input(s): POCBNP CBG: No results for input(s): GLUCAP in the last 8760 hours.  Procedures and Imaging Studies During Stay: DG Ribs Unilateral W/Chest Left  Result Date: 08/24/2020 CLINICAL DATA:  Status post fall. EXAM: LEFT RIBS AND CHEST - 3+ VIEW COMPARISON:  December 28, 2017 FINDINGS: A radiopaque marker was placed at the site of the patient's pain. No fracture or other bone lesions are seen involving the ribs. An intact right shoulder replacement is noted. Multilevel degenerative changes seen throughout the thoracic spine. There is no evidence of pneumothorax or pleural effusion. Both lungs are clear. Heart size and mediastinal contours are within normal limits. IMPRESSION: 1. No acute rib fracture. Electronically Signed   By: Virgina Norfolk M.D.   On:  08/24/2020 19:51   CT HEAD WO CONTRAST  Result Date: 08/25/2020 CLINICAL DATA:  Follow-up subarachnoid hemorrhage. EXAM: CT HEAD WITHOUT CONTRAST TECHNIQUE: Contiguous axial images were obtained from the base of the skull through the vertex without intravenous contrast. COMPARISON:  08/24/2020 FINDINGS: Brain: Focal acute right frontoparietal subarachnoid hemorrhage with possible adjacent surface parenchymal hemorrhage. No change in appearance or distribution since the previous study. No evidence of significant progression. Unchanged cerebral atrophy and small vessel ischemic change. No mass effect or midline shift. No significant ventricular dilatation. Vascular: Moderate intracranial arterial vascular calcifications. Skull: Calvarium appears intact. Sinuses/Orbits: Paranasal sinuses and mastoid air cells are clear. Other: None. IMPRESSION: 1. Focal acute right frontoparietal subarachnoid hemorrhage with possible adjacent surface parenchymal hemorrhage. No progression or developing complications since previous study. 2. Unchanged cerebral atrophy and small vessel ischemic change. Electronically Signed   By: Oren Beckmann.D.  On: 08/25/2020 01:19   CT Head Wo Contrast  Result Date: 08/24/2020 CLINICAL DATA:  Status post fall 1 getting female. Possible loss of consciousness. Facial swelling left cheek. Abrasion. EXAM: CT HEAD WITHOUT CONTRAST CT MAXILLOFACIAL WITHOUT CONTRAST CT CERVICAL SPINE WITHOUT CONTRAST TECHNIQUE: Multidetector CT imaging of the head, cervical spine, and maxillofacial structures were performed using the standard protocol without intravenous contrast. Multiplanar CT image reconstructions of the cervical spine and maxillofacial structures were also generated. COMPARISON:  MRA head 12/12/2017 FINDINGS: CT HEAD FINDINGS Brain: Cerebral ventricle sizes are concordant with the degree of cerebral volume loss. Loss Patchy and confluent areas of decreased attenuation are noted throughout  the deep and periventricular white matter of the cerebral hemispheres bilaterally, compatible with chronic microvascular ischemic disease. No evidence of large-territorial acute infarction. No definite parenchymal hemorrhage. No mass lesion. Small moderate volume right frontoparietal sub arachnoid hemorrhage. No mass effect or midline shift. No hydrocephalus. No intraventricular extension of the hemorrhage. Basilar cisterns are patent. Vascular: No hyperdense vessel. Skull: No acute fracture or focal lesion. Other: None. CT MAXILLOFACIAL FINDINGS Osseous: No acute displaced fracture. Sinuses/Orbits: Paranasal sinuses and mastoid air cells are clear. Bilateral lens replacement. Otherwise the orbits are unremarkable. Soft tissues: Left maxillary subcutaneus soft tissue edema with associated 2 x 1 cm sub cutaneous hematoma formation. CT CERVICAL SPINE FINDINGS Alignment: Straightening of the normal cervical lordosis likely due to positioning. Grade 1 anterolisthesis of L3 on L4 and L4 on L5. Skull base and vertebrae: Severe multilevel degenerative changes of the cervical spine with multilevel intervertebral disc space narrowing which is most prominent at the C6-C7 levels. No acute fracture. No aggressive appearing focal osseous lesion or focal pathologic process. Soft tissues and spinal canal: No prevertebral fluid or swelling. No visible canal hematoma. Upper chest: Biapical pleural/pulmonary scarring. Other: None. IMPRESSION: 1. Right frontoparietal subarachnoid hemorrhage. Associated underlying intraparenchymal hemorrhage not excluded. Recommend repeat CT head in 6 hours. 2. No acute facial fracture in a patient with left maxillary subcutaneus soft tissue hematoma formation. 3. No acute displaced fracture or traumatic listhesis of the cervical spine in a patient with severe multilevel degenerative changes. These results were called by telephone at the time of interpretation on 08/24/2020 at 7:46 pm to provider Dr.  Alvino Chapel, who verbally acknowledged these results. Electronically Signed   By: Iven Finn M.D.   On: 08/24/2020 19:53   CT Cervical Spine Wo Contrast  Result Date: 08/24/2020 CLINICAL DATA:  Status post fall 1 getting female. Possible loss of consciousness. Facial swelling left cheek. Abrasion. EXAM: CT HEAD WITHOUT CONTRAST CT MAXILLOFACIAL WITHOUT CONTRAST CT CERVICAL SPINE WITHOUT CONTRAST TECHNIQUE: Multidetector CT imaging of the head, cervical spine, and maxillofacial structures were performed using the standard protocol without intravenous contrast. Multiplanar CT image reconstructions of the cervical spine and maxillofacial structures were also generated. COMPARISON:  MRA head 12/12/2017 FINDINGS: CT HEAD FINDINGS Brain: Cerebral ventricle sizes are concordant with the degree of cerebral volume loss. Loss Patchy and confluent areas of decreased attenuation are noted throughout the deep and periventricular white matter of the cerebral hemispheres bilaterally, compatible with chronic microvascular ischemic disease. No evidence of large-territorial acute infarction. No definite parenchymal hemorrhage. No mass lesion. Small moderate volume right frontoparietal sub arachnoid hemorrhage. No mass effect or midline shift. No hydrocephalus. No intraventricular extension of the hemorrhage. Basilar cisterns are patent. Vascular: No hyperdense vessel. Skull: No acute fracture or focal lesion. Other: None. CT MAXILLOFACIAL FINDINGS Osseous: No acute displaced fracture. Sinuses/Orbits: Paranasal sinuses and mastoid  air cells are clear. Bilateral lens replacement. Otherwise the orbits are unremarkable. Soft tissues: Left maxillary subcutaneus soft tissue edema with associated 2 x 1 cm sub cutaneous hematoma formation. CT CERVICAL SPINE FINDINGS Alignment: Straightening of the normal cervical lordosis likely due to positioning. Grade 1 anterolisthesis of L3 on L4 and L4 on L5. Skull base and vertebrae: Severe  multilevel degenerative changes of the cervical spine with multilevel intervertebral disc space narrowing which is most prominent at the C6-C7 levels. No acute fracture. No aggressive appearing focal osseous lesion or focal pathologic process. Soft tissues and spinal canal: No prevertebral fluid or swelling. No visible canal hematoma. Upper chest: Biapical pleural/pulmonary scarring. Other: None. IMPRESSION: 1. Right frontoparietal subarachnoid hemorrhage. Associated underlying intraparenchymal hemorrhage not excluded. Recommend repeat CT head in 6 hours. 2. No acute facial fracture in a patient with left maxillary subcutaneus soft tissue hematoma formation. 3. No acute displaced fracture or traumatic listhesis of the cervical spine in a patient with severe multilevel degenerative changes. These results were called by telephone at the time of interpretation on 08/24/2020 at 7:46 pm to provider Dr. Alvino Chapel, who verbally acknowledged these results. Electronically Signed   By: Iven Finn M.D.   On: 08/24/2020 19:53   CT Maxillofacial Wo Contrast  Result Date: 08/24/2020 CLINICAL DATA:  Status post fall 1 getting female. Possible loss of consciousness. Facial swelling left cheek. Abrasion. EXAM: CT HEAD WITHOUT CONTRAST CT MAXILLOFACIAL WITHOUT CONTRAST CT CERVICAL SPINE WITHOUT CONTRAST TECHNIQUE: Multidetector CT imaging of the head, cervical spine, and maxillofacial structures were performed using the standard protocol without intravenous contrast. Multiplanar CT image reconstructions of the cervical spine and maxillofacial structures were also generated. COMPARISON:  MRA head 12/12/2017 FINDINGS: CT HEAD FINDINGS Brain: Cerebral ventricle sizes are concordant with the degree of cerebral volume loss. Loss Patchy and confluent areas of decreased attenuation are noted throughout the deep and periventricular white matter of the cerebral hemispheres bilaterally, compatible with chronic microvascular ischemic  disease. No evidence of large-territorial acute infarction. No definite parenchymal hemorrhage. No mass lesion. Small moderate volume right frontoparietal sub arachnoid hemorrhage. No mass effect or midline shift. No hydrocephalus. No intraventricular extension of the hemorrhage. Basilar cisterns are patent. Vascular: No hyperdense vessel. Skull: No acute fracture or focal lesion. Other: None. CT MAXILLOFACIAL FINDINGS Osseous: No acute displaced fracture. Sinuses/Orbits: Paranasal sinuses and mastoid air cells are clear. Bilateral lens replacement. Otherwise the orbits are unremarkable. Soft tissues: Left maxillary subcutaneus soft tissue edema with associated 2 x 1 cm sub cutaneous hematoma formation. CT CERVICAL SPINE FINDINGS Alignment: Straightening of the normal cervical lordosis likely due to positioning. Grade 1 anterolisthesis of L3 on L4 and L4 on L5. Skull base and vertebrae: Severe multilevel degenerative changes of the cervical spine with multilevel intervertebral disc space narrowing which is most prominent at the C6-C7 levels. No acute fracture. No aggressive appearing focal osseous lesion or focal pathologic process. Soft tissues and spinal canal: No prevertebral fluid or swelling. No visible canal hematoma. Upper chest: Biapical pleural/pulmonary scarring. Other: None. IMPRESSION: 1. Right frontoparietal subarachnoid hemorrhage. Associated underlying intraparenchymal hemorrhage not excluded. Recommend repeat CT head in 6 hours. 2. No acute facial fracture in a patient with left maxillary subcutaneus soft tissue hematoma formation. 3. No acute displaced fracture or traumatic listhesis of the cervical spine in a patient with severe multilevel degenerative changes. These results were called by telephone at the time of interpretation on 08/24/2020 at 7:46 pm to provider Dr. Alvino Chapel, who verbally acknowledged these results.  Electronically Signed   By: Iven Finn M.D.   On: 08/24/2020 19:53     Assessment/Plan:   Subarachnoid hemorrhage (HCC) subarachnoid hemorrhage/intraparenchymal hemorrhage, f/u neurology 09/12/20, completed Keppra bid x 2 weeks, ASA restarted. The patient has regained physical strength, ADL function, hemodynamically stable to return to [facility] for living, continuation of therapy.   Left lower lobe pneumonia Her SNF [facility] stay was complicated with left lower lobe pneumonia, fully treated with 7 day course of Levaquin started 09/01/20   Left rib fracture Closed fracture of multiple ribs left side identified SNF [facility], pending repeated X-ray 2 weeks, pain is controlled with Tylenol.   Lymphoma, marginal zone, lymph nodes of multiple sites ([place]) Marginal zone lymphoma, f/u oncology, wbc 40s    Hyponatremia Hyponatremia Na 138 08/27/20   Hypothyroidism Hypothyroidism, takes Levothyroxine    Cerebellar infarct ([place]) Chronic left cerebellar infarct, hx of   Gait abnormality Gait abnormality, therapy.    Osteoporosis OP DEXA 02/25/20 tscore -3.8   Migraines Migraines, takes Propranolol.   HTN (hypertension) HTN recommended MAP range 70-90, Bun/creat 16/0.88 08/27/20   Trigeminal neuralgia Trigeminal neuralgia, takes Gabpantin   Bartholin gland cyst Bartholin gland cysts on each labia per report.      Patient is being discharged with the following home health services:    Patient is being discharged with the following durable medical equipment:    Patient has been advised to f/u with their PCP in 1-2 weeks to bring them up to date on their rehab stay.  Social services at facility was responsible for arranging this appointment.  Pt was provided with a 30 day supply of prescriptions for medications and refills must be obtained from their PCP.  For controlled substances, a more limited supply may be provided adequate until PCP appointment only.  Future labs/tests needed:  The patient stated she will f/u with her PCP next week for f/u X-ray of  chest/L ribs.

## 2020-09-13 NOTE — Assessment & Plan Note (Signed)
Her SNF [facility] stay was complicated with left lower lobe pneumonia, fully treated with 7 day course of Levaquin started 09/01/20

## 2020-09-13 NOTE — Assessment & Plan Note (Signed)
OP DEXA 02/25/20 tscore -3.8

## 2020-09-13 NOTE — Assessment & Plan Note (Signed)
subarachnoid hemorrhage/intraparenchymal hemorrhage, f/u neurology 09/12/20, completed Keppra bid x 2 weeks, ASA restarted. The patient has regained physical strength, ADL function, hemodynamically stable to return to [facility] for living, continuation of therapy.

## 2020-09-13 NOTE — Assessment & Plan Note (Signed)
Hyponatremia Na 138 08/27/20

## 2020-09-13 NOTE — Assessment & Plan Note (Signed)
Marginal zone lymphoma, f/u oncology, wbc 40s

## 2020-09-13 NOTE — Assessment & Plan Note (Signed)
Migraines, takes Propranolol.

## 2020-09-26 ENCOUNTER — Ambulatory Visit: Payer: Medicare Other | Admitting: Hematology and Oncology

## 2020-09-26 ENCOUNTER — Other Ambulatory Visit: Payer: Medicare Other

## 2020-09-26 ENCOUNTER — Encounter: Payer: Self-pay | Admitting: Hematology and Oncology

## 2020-09-27 ENCOUNTER — Telehealth: Payer: Self-pay

## 2020-09-27 NOTE — Telephone Encounter (Signed)
Per inbasket and secure chat, pt has been sch for a 2nd op, a vm has been left with appt.Marland KitchenMarland KitchenAOM

## 2020-09-28 ENCOUNTER — Telehealth: Payer: Self-pay

## 2020-09-28 NOTE — Telephone Encounter (Signed)
Joy Lynch called in to confirm the 10/14/20 appt....aom

## 2020-10-05 DIAGNOSIS — C9110 Chronic lymphocytic leukemia of B-cell type not having achieved remission: Secondary | ICD-10-CM | POA: Diagnosis not present

## 2020-10-05 DIAGNOSIS — R600 Localized edema: Secondary | ICD-10-CM | POA: Diagnosis not present

## 2020-10-05 DIAGNOSIS — E039 Hypothyroidism, unspecified: Secondary | ICD-10-CM | POA: Diagnosis not present

## 2020-10-12 DIAGNOSIS — E039 Hypothyroidism, unspecified: Secondary | ICD-10-CM | POA: Diagnosis not present

## 2020-10-14 ENCOUNTER — Inpatient Hospital Stay (HOSPITAL_BASED_OUTPATIENT_CLINIC_OR_DEPARTMENT_OTHER): Payer: Medicare Other | Admitting: Hematology & Oncology

## 2020-10-14 ENCOUNTER — Encounter: Payer: Self-pay | Admitting: Hematology & Oncology

## 2020-10-14 ENCOUNTER — Inpatient Hospital Stay: Payer: Medicare Other | Attending: Hematology & Oncology

## 2020-10-14 ENCOUNTER — Other Ambulatory Visit: Payer: Self-pay

## 2020-10-14 ENCOUNTER — Telehealth: Payer: Self-pay

## 2020-10-14 VITALS — BP 104/56 | HR 78 | Temp 97.4°F | Resp 19

## 2020-10-14 DIAGNOSIS — C8300 Small cell B-cell lymphoma, unspecified site: Secondary | ICD-10-CM | POA: Diagnosis not present

## 2020-10-14 DIAGNOSIS — R609 Edema, unspecified: Secondary | ICD-10-CM | POA: Insufficient documentation

## 2020-10-14 DIAGNOSIS — D649 Anemia, unspecified: Secondary | ICD-10-CM | POA: Diagnosis not present

## 2020-10-14 DIAGNOSIS — Z8572 Personal history of non-Hodgkin lymphomas: Secondary | ICD-10-CM

## 2020-10-14 DIAGNOSIS — E8809 Other disorders of plasma-protein metabolism, not elsewhere classified: Secondary | ICD-10-CM

## 2020-10-14 DIAGNOSIS — R768 Other specified abnormal immunological findings in serum: Secondary | ICD-10-CM | POA: Diagnosis not present

## 2020-10-14 LAB — CMP (CANCER CENTER ONLY)
ALT: 8 U/L (ref 0–44)
AST: 20 U/L (ref 15–41)
Albumin: 2.3 g/dL — ABNORMAL LOW (ref 3.5–5.0)
Alkaline Phosphatase: 72 U/L (ref 38–126)
Anion gap: 8 (ref 5–15)
BUN: 31 mg/dL — ABNORMAL HIGH (ref 8–23)
CO2: 31 mmol/L (ref 22–32)
Calcium: 9.3 mg/dL (ref 8.9–10.3)
Chloride: 105 mmol/L (ref 98–111)
Creatinine: 1.21 mg/dL — ABNORMAL HIGH (ref 0.44–1.00)
GFR, Estimated: 42 mL/min — ABNORMAL LOW (ref >60)
Glucose, Bld: 131 mg/dL — ABNORMAL HIGH (ref 70–99)
Potassium: 4.2 mmol/L (ref 3.5–5.1)
Sodium: 144 mmol/L (ref 135–145)
Total Bilirubin: 0.4 mg/dL (ref 0.3–1.2)
Total Protein: 6.6 g/dL (ref 6.5–8.1)

## 2020-10-14 LAB — CBC WITH DIFFERENTIAL (CANCER CENTER ONLY)
Abs Immature Granulocytes: 0 10*3/uL (ref 0.00–0.07)
Basophils Absolute: 0 10*3/uL (ref 0.0–0.1)
Basophils Relative: 0 %
Eosinophils Absolute: 0 10*3/uL (ref 0.0–0.5)
Eosinophils Relative: 0 %
HCT: 30.2 % — ABNORMAL LOW (ref 36.0–46.0)
Hemoglobin: 9.4 g/dL — ABNORMAL LOW (ref 12.0–15.0)
Lymphocytes Relative: 92 %
Lymphs Abs: 51.4 10*3/uL — ABNORMAL HIGH (ref 0.7–4.0)
MCH: 34.1 pg — ABNORMAL HIGH (ref 26.0–34.0)
MCHC: 31.1 g/dL (ref 30.0–36.0)
MCV: 109.4 fL — ABNORMAL HIGH (ref 80.0–100.0)
Monocytes Absolute: 0.6 10*3/uL (ref 0.1–1.0)
Monocytes Relative: 1 %
Neutro Abs: 3.9 10*3/uL (ref 1.7–7.7)
Neutrophils Relative %: 7 %
Platelet Count: 213 10*3/uL (ref 150–400)
RBC: 2.76 MIL/uL — ABNORMAL LOW (ref 3.87–5.11)
RDW: 15.7 % — ABNORMAL HIGH (ref 11.5–15.5)
WBC Count: 55.9 10*3/uL (ref 4.0–10.5)
nRBC: 0 % (ref 0.0–0.2)

## 2020-10-14 LAB — LACTATE DEHYDROGENASE: LDH: 322 U/L — ABNORMAL HIGH (ref 98–192)

## 2020-10-14 LAB — PREALBUMIN: Prealbumin: 11.3 mg/dL — ABNORMAL LOW (ref 18–38)

## 2020-10-14 NOTE — Telephone Encounter (Signed)
appts made and printed for pt per los, aware that we will call with scan appt    aom

## 2020-10-15 LAB — IGG, IGA, IGM
IgA: 329 mg/dL (ref 64–422)
IgG (Immunoglobin G), Serum: 161 mg/dL — ABNORMAL LOW (ref 586–1602)
IgM (Immunoglobulin M), Srm: 3313 mg/dL — ABNORMAL HIGH (ref 26–217)

## 2020-10-17 ENCOUNTER — Telehealth: Payer: Self-pay | Admitting: Pharmacy Technician

## 2020-10-17 ENCOUNTER — Other Ambulatory Visit: Payer: Self-pay | Admitting: Hematology & Oncology

## 2020-10-17 ENCOUNTER — Telehealth: Payer: Self-pay | Admitting: Pharmacist

## 2020-10-17 DIAGNOSIS — E8809 Other disorders of plasma-protein metabolism, not elsewhere classified: Secondary | ICD-10-CM | POA: Insufficient documentation

## 2020-10-17 LAB — KAPPA/LAMBDA LIGHT CHAINS
Kappa free light chain: 10 mg/L (ref 3.3–19.4)
Kappa, lambda light chain ratio: 0.3 (ref 0.26–1.65)
Lambda free light chains: 33.3 mg/L — ABNORMAL HIGH (ref 5.7–26.3)

## 2020-10-17 MED ORDER — ALBUMIN HUMAN 25 % IV SOLN: 50.0000 g | Freq: Once | INTRAVENOUS | Status: DC

## 2020-10-17 MED ORDER — FUROSEMIDE 10 MG/ML IJ SOLN: 40.0000 mg | Freq: Once | INTRAMUSCULAR | Status: DC

## 2020-10-17 MED ORDER — UMBRALISIB TOSYLATE 200 MG PO TABS: 400.0000 mg | ORAL_TABLET | Freq: Every day | ORAL | 3 refills | Status: DC

## 2020-10-17 NOTE — Telephone Encounter (Cosign Needed)
Oral Oncology Pharmacy Student Encounter  Received new prescription for Joy Lynch (umbralisib) for the treatment of relapsed marginal zone lymphoma in conjunction with PJP and CMV prophylaxis to be determined, planned duration until disease progression or unacceptable toxicity.  CBC and CMP from 10/14/20 assessed, no relevant abnormalities noted. Prescription dose and frequency assessed. Initial dose reduced due to age and expected tolerability.  Current medication list in Epic reviewed, no DDIs with Ukoniq identified.  Evaluated chart and no patient barriers to medication adherence identified.   Prescription has been e-scribed to the [facility] for benefits analysis and approval.  Oral Oncology Clinic will continue to follow for insurance authorization, copayment issues, initial counseling and start date.  Brenton Grills, PharmD Candidate [facility] [phone]  10/17/2020 12:10 PM

## 2020-10-17 NOTE — Addendum Note (Signed)
Addended by: Arbutus Ped on: 10/17/2020 10:45 AM   Modules accepted: Orders

## 2020-10-17 NOTE — Progress Notes (Signed)
Hematology and Oncology Follow Up Visit  Joy Lynch [id number] [birth year] 87 y.o. 10/17/2020   Principle Diagnosis:   Marginal Zone lymphoma -- relapsed  Current Therapy:    Umbralisib 400 mg po q day 00 start on 10/22/2020     Interim History:  Ms. Bernat is in the [place] for her first visit.  She had been seen at the [facility].  It is a little more convenient for her to be treated in our location.  She is quite elderly.  She has a lot of health issues.  She has a marginal performance status.  Her biggest problem is clearly leg swelling.  She has a lot of leg swelling.  This mostly is in her right leg.  She has been tested for blood clots and this has been negative.  I suspect that this might be reflective of her marginal zone lymphoma and progressive disease.  I do not think she has had any treatment for this for quite a while.  She has been treated in the past with Rituxan.  She has been treated always with Bendamustine.  She really is not able to tolerate treatment.  However, she wishes to try something just to help with her leg swelling so her quality of life would be improved.  We are clearly going to have to get a CT scan to assess for her lymphadenopathy.  She is anemic.  Her white cell count is quite elevated.  She is not thrombocytopenic.  She has a markedly elevated IgM level of 3300 mg/dL.  I am sure this is also affecting her immunologic function.  Her prealbumin is 11.3.  She has an elevated LDH of 322.  She lives at [facility].  She comes in with a nephew today.  I would have to say that performance status, at best is ECOG 2-3.  She has had that she will not take chemotherapy.  I understand this.  What might be reasonable is to try to use Umbralisib, which is a PI3K inhibitor.  This has been approved for relapsed marginal zone lymphoma.  We will clearly have to utilize reduced dose.  I would probably try her on the 400 mg  dose.  She has had no fever.  She has had no bleeding.  She has had no cough or shortness of breath.  Her appetite is okay.  There is no nausea or vomiting.  Medications:  Current Outpatient Medications:  Secundino Ginger Tosylate 200 MG TABS, Take 400 mg by mouth daily., Disp: 60 tablet, Rfl: 3 .  acetaminophen (TYLENOL) 500 MG tablet, Take 500 mg by mouth every 6 (six) hours., Disp: , Rfl:  .  aspirin 81 MG chewable tablet, Chew 81 mg by mouth daily., Disp: , Rfl:  .  Biotin 10 MG CAPS, Take 10 mg by mouth daily., Disp: , Rfl:  .  calcium-vitamin D (OSCAL WITH D) 500-200 MG-UNIT tablet, Take 1 tablet by mouth daily. , Disp: , Rfl:  .  gabapentin (NEURONTIN) 800 MG tablet, Take 800 mg by mouth 3 (three) times daily., Disp: , Rfl:  .  hydroxypropyl methylcellulose / hypromellose (ISOPTO TEARS / GONIOVISC) 2.5 % ophthalmic solution, Place 1 drop into both eyes 4 (four) times daily as needed for dry eyes., Disp: 15 mL, Rfl: 12 .  levothyroxine (SYNTHROID) 100 MCG tablet, Take 100 mcg by mouth daily before breakfast., Disp: , Rfl:  .  Multiple Vitamin (MULTI-VITAMINS) TABS, Take 1 tablet by  mouth daily., Disp: , Rfl:  .  polyvinyl alcohol (LIQUIFILM TEARS) 1.4 % ophthalmic solution, Place 1 drop into both eyes 4 (four) times daily., Disp: , Rfl:  .  propranolol (INDERAL) 10 MG tablet, Take 10 mg by mouth daily., Disp: , Rfl:  .  sodium chloride (OCEAN) 0.65 % SOLN nasal spray, Place 2 sprays into both nostrils every 4 (four) hours., Disp: 30 mL, Rfl: 0 .  Triamcinolone Acetonide 0.025 % LOTN, Apply topically., Disp: , Rfl:  .  zinc oxide 20 % ointment, Apply 1 application topically as needed for irritation. To buttocks after every incontinent episode and as needed for redness. May keep at bedside., Disp: , Rfl:   Allergies:  Allergies  Allergen Reactions  . Penicillins Hives  . Carbamazepine Other (See Comments) and Hives    Turned purple from neck down and drowsy Other reaction(s): Other (See  Comments) Turned purple from neck down and drowsy  . Pregabalin Other (See Comments)    Drowsy Other reaction(s): Confusion (intolerance) Drowsy    Past Medical History, Surgical history, Social history, and Family History were reviewed and updated.  Review of Systems: Review of Systems  Constitutional: Positive for fatigue.  HENT:  Negative.   Eyes: Negative.   Respiratory: Negative.   Cardiovascular: Positive for leg swelling.  Gastrointestinal: Positive for abdominal pain.  Endocrine: Negative.   Musculoskeletal: Positive for flank pain.  Skin: Negative.   Neurological: Negative.   Hematological: Negative.   Psychiatric/Behavioral: Negative.     Physical Exam:  oral temperature is 97.4 F (36.3 C) (abnormal). Her blood pressure is 104/56 (abnormal) and her pulse is 78. Her respiration is 19 and oxygen saturation is 96%.   Wt Readings from Last 3 Encounters:  09/13/20 128 lb 9.6 oz (58.3 kg)  09/09/20 128 lb 9.6 oz (58.3 kg)  09/01/20 128 lb 12.8 oz (58.4 kg)    Physical Exam Vitals reviewed.  HENT:     Head: Normocephalic and atraumatic.     Mouth/Throat:     Mouth: Oropharynx is clear and moist.  Eyes:     Extraocular Movements: EOM normal.     Pupils: Pupils are equal, round, and reactive to light.  Cardiovascular:     Rate and Rhythm: Normal rate and regular rhythm.     Heart sounds: Normal heart sounds.  Pulmonary:     Effort: Pulmonary effort is normal.     Breath sounds: Normal breath sounds.  Abdominal:     General: Bowel sounds are normal.     Palpations: Abdomen is soft.  Musculoskeletal:        General: No tenderness, deformity or edema. Normal range of motion.     Cervical back: Normal range of motion.     Comments: She has marked edema in the legs.  More so in the right leg than the left leg.  She has some erythema.  She has some dry skin.  Lymphadenopathy:     Cervical: No cervical adenopathy.  Skin:    General: Skin is warm and dry.      Findings: No erythema or rash.  Neurological:     Mental Status: She is alert and oriented to person, place, and time.  Psychiatric:        Mood and Affect: Mood and affect normal.        Behavior: Behavior normal.        Thought Content: Thought content normal.        Judgment: Judgment normal.  Lab Results  Component Value Date   WBC 55.9 (HH) 10/14/2020   HGB 9.4 (L) 10/14/2020   HCT 30.2 (L) 10/14/2020   MCV 109.4 (H) 10/14/2020   PLT 213 10/14/2020     Chemistry      Component Value Date/Time   NA 144 10/14/2020 1042   NA 139 09/08/2020 0000   NA 138 08/16/2017 1256   K 4.2 10/14/2020 1042   K 4.3 08/16/2017 1256   CL 105 10/14/2020 1042   CO2 31 10/14/2020 1042   CO2 25 08/16/2017 1256   BUN 31 (H) 10/14/2020 1042   BUN 21 09/08/2020 0000   BUN 19.6 08/16/2017 1256   CREATININE 1.21 (H) 10/14/2020 1042   CREATININE 0.7 08/16/2017 1256   GLU 86 09/08/2020 0000      Component Value Date/Time   CALCIUM 9.3 10/14/2020 1042   CALCIUM 9.2 08/16/2017 1256   ALKPHOS 72 10/14/2020 1042   ALKPHOS 42 08/16/2017 1256   AST 20 10/14/2020 1042   AST 18 08/16/2017 1256   ALT 8 10/14/2020 1042   ALT 11 08/16/2017 1256   BILITOT 0.4 10/14/2020 1042   BILITOT 0.87 08/16/2017 1256      Impression and Plan: Ms. Diperna is a very nice 87 year old white female.  She has a very difficult problem.  She has relapsed marginal zone lymphoma.  She just does not have a great performance status.  Our options are very limited.  However, I do think that it would be reasonable to try her on the new oral agent- Umbralisib.  Again we will use a reduced dose.  We will try her on 400 mg.  She will need to have pneumocystis prophylaxis and also antiviral prophylaxis.  We will try to get the swelling out of her legs.  Again a CT scan will help [place] with this.  We will have to see what his CT scan shows to see if she has a lot of lymphadenopathy in the pelvis that could be causing some  lymphedema.  I would think that if we can use the Umbralisib, that he might take a while for this to work.  Clearly, this is all about quality of life.  I just want to see if we can help improve her quality of life.  She really has a negligible quality of life right now, mostly because of the edema in her legs.  Being anemic certainly does not help.  I am sure that she has marrow infiltration that is significant by this marginal zone lymphoma.  This is quite complicated.  She has a lot of intercurrent health problems.  Again we will try an oral agent to see if it will help her.   Volanda Napoleon, MD 1/17/202210:04 AM

## 2020-10-17 NOTE — Telephone Encounter (Signed)
Oral Oncology Patient Advocate Encounter  Prior Authorization for Joy Lynch has been approved.    PA# [id number] Effective dates: 10/17/20 through 09/30/21  Patients co-pay is $2450.47  Oral Oncology Clinic will continue to follow.   [place] Patient [place] Phone [phone] Fax [phone] 10/17/2020 2:24 PM

## 2020-10-17 NOTE — Telephone Encounter (Signed)
Oral Oncology Patient Advocate Encounter  Received notification from [employer] that prior authorization for Joy Lynch is required.  PA submitted on CoverMyMeds Key [id number]   Status is pending  Oral Oncology Clinic will continue to follow.  [name] Patient [place] Phone [phone] Fax [phone] 10/17/2020 2:13 PM

## 2020-10-18 ENCOUNTER — Telehealth: Payer: Self-pay | Admitting: Pharmacy Technician

## 2020-10-18 LAB — BETA 2 MICROGLOBULIN, SERUM: Beta-2 Microglobulin: 6.6 mg/L — ABNORMAL HIGH (ref 0.6–2.4)

## 2020-10-18 NOTE — Telephone Encounter (Signed)
Oral Oncology Patient Advocate Encounter  Was successful in securing patient a $ 5000 grant from [employer] ([employer]) to provide copayment coverage for his Paulette Blanch.  This will keep the out of pocket expense at $0.    The billing information is as follows and has been shared with [place].   Member ID: [id number] Group ID: [id number] RxBin: [id number] Dates of Eligibility: 10/18/21 through 10/18/2021  Fund: [place] Patient [place] Phone [phone] Fax [phone] 10/18/2020 12:07 PM

## 2020-10-19 ENCOUNTER — Ambulatory Visit (HOSPITAL_BASED_OUTPATIENT_CLINIC_OR_DEPARTMENT_OTHER)
Admission: RE | Admit: 2020-10-19 | Discharge: 2020-10-19 | Disposition: A | Discharge status: Home / Self Care | Payer: Medicare Other | Source: Ambulatory Visit | Attending: Hematology & Oncology | Admitting: Hematology & Oncology

## 2020-10-19 ENCOUNTER — Inpatient Hospital Stay: Payer: Medicare Other

## 2020-10-19 ENCOUNTER — Other Ambulatory Visit: Payer: Self-pay

## 2020-10-19 VITALS — BP 107/59 | HR 91 | Temp 97.1°F | Resp 16

## 2020-10-19 DIAGNOSIS — R768 Other specified abnormal immunological findings in serum: Secondary | ICD-10-CM | POA: Diagnosis not present

## 2020-10-19 DIAGNOSIS — R609 Edema, unspecified: Secondary | ICD-10-CM | POA: Diagnosis not present

## 2020-10-19 DIAGNOSIS — C8588 Other specified types of non-Hodgkin lymphoma, lymph nodes of multiple sites: Secondary | ICD-10-CM

## 2020-10-19 DIAGNOSIS — K5730 Diverticulosis of large intestine without perforation or abscess without bleeding: Secondary | ICD-10-CM | POA: Diagnosis not present

## 2020-10-19 DIAGNOSIS — Z8572 Personal history of non-Hodgkin lymphomas: Secondary | ICD-10-CM

## 2020-10-19 DIAGNOSIS — C8300 Small cell B-cell lymphoma, unspecified site: Secondary | ICD-10-CM | POA: Diagnosis not present

## 2020-10-19 DIAGNOSIS — D649 Anemia, unspecified: Secondary | ICD-10-CM | POA: Diagnosis not present

## 2020-10-19 DIAGNOSIS — C969 Malignant neoplasm of lymphoid, hematopoietic and related tissue, unspecified: Secondary | ICD-10-CM | POA: Diagnosis not present

## 2020-10-19 DIAGNOSIS — E8809 Other disorders of plasma-protein metabolism, not elsewhere classified: Secondary | ICD-10-CM

## 2020-10-19 MED ORDER — ACETAMINOPHEN 325 MG PO TABS
650.0000 mg | ORAL_TABLET | Freq: Once | ORAL | Status: AC
  Administered 2020-10-19: 650 mg via ORAL

## 2020-10-19 MED ORDER — ALBUMIN HUMAN 25 % IV SOLN
50.0000 g | Freq: Once | INTRAVENOUS | Status: AC
  Administered 2020-10-19: 50 g via INTRAVENOUS
  Filled 2020-10-19: qty 200

## 2020-10-19 MED ORDER — SODIUM CHLORIDE 0.9 % IV SOLN
INTRAVENOUS | Status: DC
  Filled 2020-10-19 (×2): qty 250

## 2020-10-19 MED ORDER — ACETAMINOPHEN 325 MG PO TABS
ORAL_TABLET | ORAL | Status: AC
  Filled 2020-10-19: qty 2

## 2020-10-19 MED ORDER — FUROSEMIDE 10 MG/ML IJ SOLN: 40.0000 mg | Freq: Once | INTRAMUSCULAR | Status: DC

## 2020-10-19 NOTE — Patient Instructions (Signed)
Albumin injection What is this medicine? ALBUMIN (al BYOO min) is used to treat or prevent shock following serious injury, bleeding, surgery, or burns by increasing the volume of blood plasma. This medicine can also replace low blood protein. This medicine may be used for other purposes; ask your health care provider or pharmacist if you have questions. COMMON BRAND NAME(S): Albuked, Albumarc, Albuminar, Albuminex, AlbuRx, Albutein, Buminate, Flexbumin, Kedbumin, Macrotec, Plasbumin What should I tell my health care provider before I take this medicine? They need to know if you have any of the following conditions:  anemia  heart disease  kidney disease  an unusual or allergic reaction to albumin, other medicines, foods, dyes, or preservatives  pregnant or trying to get pregnant  breast-feeding How should I use this medicine? This medicine is for infusion into a vein. It is given by a health-care professional in a hospital or clinic. Talk to your pediatrician regarding the use of this medicine in children. While this drug may be prescribed for selected conditions, precautions do apply. Overdosage: If you think you have taken too much of this medicine contact a poison control center or emergency room at once. NOTE: This medicine is only for you. Do not share this medicine with others. What if I miss a dose? This does not apply. What may interact with this medicine? Interactions are not expected. This list may not describe all possible interactions. Give your health care provider a list of all the medicines, herbs, non-prescription drugs, or dietary supplements you use. Also tell them if you smoke, drink alcohol, or use illegal drugs. Some items may interact with your medicine. What should I watch for while using this medicine? Your condition will be closely monitored while you receive this medicine. Some products are derived from human plasma, and there is a small risk that these  products may contain certain types of virus or bacteria. All products are processed to kill most viruses and bacteria. If you have questions concerning the risk of infections, discuss them with your doctor or health care professional. What side effects may I notice from receiving this medicine? Side effects that you should report to your doctor or health care professional as soon as possible:  allergic reactions like skin rash, itching or hives, swelling of the face, lips, or tongue  breathing problems  changes in heartbeat  fever, chills  pain, redness or swelling at the injection site  signs of viral infection including fever, drowsiness, chills, runny nose followed in about 2 weeks by a rash and joint pain  tightness in the chest Side effects that usually do not require medical attention (report to your doctor or health care professional if they continue or are bothersome):  increased salivation  nausea, vomiting This list may not describe all possible side effects. Call your doctor for medical advice about side effects. You may report side effects to FDA at 1-800-FDA-1088. Where should I keep my medicine? This does not apply. You will not be given this medicine to store at home. NOTE: This sheet is a summary. It may not cover all possible information. If you have questions about this medicine, talk to your doctor, pharmacist, or health care provider.  2021 Elsevier/Gold Standard (2007-12-11 10:18:55)  

## 2020-10-20 ENCOUNTER — Other Ambulatory Visit: Payer: Self-pay | Admitting: Hematology & Oncology

## 2020-10-20 ENCOUNTER — Telehealth: Payer: Self-pay | Admitting: *Deleted

## 2020-10-20 ENCOUNTER — Other Ambulatory Visit: Payer: Self-pay | Admitting: *Deleted

## 2020-10-20 MED ORDER — FAMCICLOVIR 250 MG PO TABS: 250.0000 mg | ORAL_TABLET | Freq: Every day | ORAL | 3 refills | Status: DC

## 2020-10-20 MED ORDER — SULFAMETHOXAZOLE-TRIMETHOPRIM 800-160 MG PO TABS: 1.0000 | ORAL_TABLET | Freq: Every day | ORAL | 3 refills | Status: DC

## 2020-10-20 MED FILL — SULFAMETHOXAZOLE-TMP DS TAB: 800-160 | 30 days supply | Qty: 30 | Fill #0

## 2020-10-20 MED FILL — FAMCICLOVIR 125 MG TABLET: 125 | 30 days supply | Qty: 60 | Fill #0

## 2020-10-20 MED FILL — UKONIQ 200 MG TABS: 200 | 30 days supply | Qty: 60 | Fill #0

## 2020-10-20 NOTE — Telephone Encounter (Signed)
Oral Chemotherapy Pharmacist Encounter  Patient Education I spoke with patient and her nephew Joy Lynch (separately phone calls) for overview of new oral chemotherapy medication: Joy Lynch (umbralisib) for the treatment of relapsed marginal zone lymphoma in conjunction with PJP and CMV prophylaxis (prescription sent to [facility]), planned duration until disease progression or unacceptable toxicity.   Counseled patient on administration, dosing, side effects, monitoring, drug-food interactions, safe handling, storage, and disposal. Patient Joy Lynch take 2 tablets (400 mg) by mouth daily.  Side effects include but not limited to: diarrhea, rash, decreased wbc, N/V.    Reviewed with patient importance of keeping a medication schedule and plan for any missed doses.  After discussion with patient one patient barriers to medication adherence identified. Patient is going to be starting 3 new medications, the Ukoniq and 2 prophylactic medications. Some concerns about patients ability to take the medications as prescribed. Review the medication administration with the patient multiple times. Offered to review the information again when he had the medication in hand. Patient's nephew helps out, he Joy Lynch pick up the patient's medication from the pharmacy.  The [facility] voiced understanding and appreciation. All questions answered. Medication handout provided.  Provided patient with [facility] phone number. Patient knows to call the office with questions or concerns. Oral Chemotherapy Navigation Clinic Joy Lynch continue to follow.  Darl Pikes, PharmD, BCPS, BCOP, CPP Hematology/Oncology Clinical Pharmacist Practitioner [facility] [phone]  10/20/2020 3:36 PM

## 2020-10-20 NOTE — Telephone Encounter (Addendum)
-----   Message from Volanda Napoleon, MD sent at 10/20/2020  7:12 AM EST ----- Called her nephew to let him know there is a lot of swollen lymph nodes in the belly and pelvis.  I am sure this is why the legs are so swollen. She really needs to get on the pills.  Nephew appreciative of the call.

## 2020-10-20 NOTE — Telephone Encounter (Signed)
Oral Oncology Patient Advocate Encounter  I spoke with Mrs. Lafontaine this afternoon to schedule Ukoniq, Famvir, and Bactrim to be filled.  Her nephew, Will, is going to pick up all 3 prescriptions from [facility] this afternoon and deliver to the patient.  Mellette will call 7-10 days before next refill is due to complete adherence call and set up delivery of medication.     [place] Patient [place] Phone [phone] Fax [phone] 10/20/2020 3:46 PM

## 2020-10-21 NOTE — Telephone Encounter (Signed)
Oral Chemotherapy Pharmacist Encounter   Called Ms. Mittal this morning to review her new medications, Ukoniq, Bactrim, and famcicovir. Reviewed dosing and administration.  Darl Pikes, PharmD, BCPS, BCOP, CPP Hematology/Oncology Clinical Pharmacist [facility] [place] [facility] [phone]  10/21/2020 9:58 AM

## 2020-11-02 ENCOUNTER — Telehealth: Payer: Self-pay | Admitting: *Deleted

## 2020-11-02 NOTE — Telephone Encounter (Signed)
Message received from patient's nephew Gwyndolyn Saxon stating that pt is going to be transferred to skilled nursing at [facility] today and is wanting to know if lab work scheduled for tomorrow can be done at [facility].  Dr. Marin Olp notified and order received for appts to be canceled for 11/03/20 and to have labs drawn at [facility].  Call placed back to Gwyndolyn Saxon and Gwyndolyn Saxon notified per order of Dr. Marin Olp that appts will be canceled for tomorrow and orders for lab work will be faxed to [facility].  Gwyndolyn Saxon is appreciative of call and has no further questions at this time.

## 2020-11-02 NOTE — Telephone Encounter (Signed)
Call placed to [facility] to inform them that Dr. Marin Olp would like a CBC and CMET drawn at their facility.  Spoke with Ola and she requests that orders be faxed to [phone].  Orders faxed with a request to fax labs back to this office.

## 2020-11-03 ENCOUNTER — Inpatient Hospital Stay: Payer: Medicare Other

## 2020-11-03 ENCOUNTER — Inpatient Hospital Stay: Payer: Medicare Other | Admitting: Hematology & Oncology

## 2020-11-03 ENCOUNTER — Non-Acute Institutional Stay (SKILLED_NURSING_FACILITY): Payer: Medicare Other | Admitting: Internal Medicine

## 2020-11-03 DIAGNOSIS — N183 Chronic kidney disease, stage 3 unspecified: Secondary | ICD-10-CM | POA: Diagnosis not present

## 2020-11-03 DIAGNOSIS — E039 Hypothyroidism, unspecified: Secondary | ICD-10-CM

## 2020-11-03 DIAGNOSIS — E871 Hypo-osmolality and hyponatremia: Secondary | ICD-10-CM

## 2020-11-03 DIAGNOSIS — R296 Repeated falls: Secondary | ICD-10-CM

## 2020-11-03 DIAGNOSIS — G500 Trigeminal neuralgia: Secondary | ICD-10-CM

## 2020-11-03 DIAGNOSIS — G43709 Chronic migraine without aura, not intractable, without status migrainosus: Secondary | ICD-10-CM | POA: Diagnosis not present

## 2020-11-03 DIAGNOSIS — D649 Anemia, unspecified: Secondary | ICD-10-CM | POA: Diagnosis not present

## 2020-11-03 DIAGNOSIS — C8588 Other specified types of non-Hodgkin lymphoma, lymph nodes of multiple sites: Secondary | ICD-10-CM | POA: Diagnosis not present

## 2020-11-03 DIAGNOSIS — R600 Localized edema: Secondary | ICD-10-CM

## 2020-11-04 ENCOUNTER — Encounter: Payer: Self-pay | Admitting: Internal Medicine

## 2020-11-04 LAB — COMPREHENSIVE METABOLIC PANEL
Albumin: 1.8 — AB (ref 3.5–5.0)
Calcium: 7.9 — AB (ref 8.7–10.7)
Globulin: 3.6

## 2020-11-04 LAB — HEPATIC FUNCTION PANEL
ALT: 9 (ref 7–35)
AST: 19 (ref 13–35)
Alkaline Phosphatase: 51 (ref 25–125)
Bilirubin, Total: 0.1

## 2020-11-04 LAB — CBC AND DIFFERENTIAL
HCT: 27 — AB (ref 36–46)
Hemoglobin: 8.6 — AB (ref 12.0–16.0)
Neutrophils Absolute: 2886
Platelets: 201 (ref 150–399)
WBC: 74

## 2020-11-04 LAB — BASIC METABOLIC PANEL
BUN: 20 (ref 4–21)
CO2: 25 — AB (ref 13–22)
Chloride: 105 (ref 99–108)
Creatinine: 1.2 — AB (ref 0.5–1.1)
Glucose: 83
Potassium: 3.8 (ref 3.4–5.3)
Sodium: 138 (ref 137–147)

## 2020-11-04 LAB — CBC: RBC: 2.54 — AB (ref 3.87–5.11)

## 2020-11-04 NOTE — Progress Notes (Signed)
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 No  Type of Advance Directive -  Does patient want to make changes to medical advance directive? No - Patient declined  Copy of [place] in Chart? -  Would patient like information on creating a medical advance directive? -     Chief Complaint  Patient presents with  . Acute Visit    HPI: Patient is a 87 y.o. female seen today for an acute visit for Readmission into SNF  Patient has a history of Relapsed Marginal Zone  lymphoma followed by oncology now on Oral therapy  osteoporosis,   hypothyroidism,  trigeminal neuralgia and migraines  She was admitted in SNF in 12/21 after sustaining injuries in her fall s/p  frontoparietal subarachnoid hemorrhage and Rib fractures She was discharged back to her Apartment but it seems she was unable to take care of herself.  Had few other falls mostly mechanical.  She states she loses her balance. Patient also has relapsed marginal zone lymphoma.  Follows with oncology and is now started on oral therapy. She did not have any acute complaints today.  Continues to have lower extremity edema.  And few dried up scabs on her legs.  Denies any shortness of breath dizziness or cough.    Past Medical History:  Diagnosis Date  . Cancer [facility])    non hodgkin lymphoma  . Chronic lymphocytic leukemia ([place])   . History of B-cell lymphoma 11/02/2015  . Migraines   . Osteoporosis   . Thyroid disease   . Trigeminal neuralgia of left side of face     Past Surgical History:  Procedure Laterality Date  . gamma knife     for trigeminal neuralgia  . SHOULDER SURGERY     Right shoulder replacement  . TONSILLECTOMY AND ADENOIDECTOMY    . WISDOM TOOTH EXTRACTION      Allergies  Allergen Reactions  . Penicillins Hives  . Carbamazepine Other (See  Comments) and Hives    Turned purple from neck down and drowsy Other reaction(s): Other (See Comments) Turned purple from neck down and drowsy  . Pregabalin Other (See Comments)    Drowsy Other reaction(s): Confusion (intolerance) Drowsy    Outpatient Encounter Medications as of 11/03/2020  Medication Sig  . famciclovir (FAMVIR) 250 MG tablet Take 1 tablet (250 mg total) by mouth daily.  . potassium chloride (KLOR-CON) 10 MEQ tablet Take 10 mEq by mouth daily.  Marland Kitchen sulfamethoxazole-trimethoprim (BACTRIM DS) 800-160 MG tablet Take 1 tablet by mouth daily.  Marland Kitchen torsemide (DEMADEX) 20 MG tablet Take 20 mg by mouth daily.  Marland Kitchen acetaminophen (TYLENOL) 500 MG tablet Take 500 mg by mouth every 6 (six) hours.  Marland Kitchen aspirin 81 MG chewable tablet Chew 81 mg by mouth daily.  . Biotin 10 MG CAPS Take 10 mg by mouth daily.  . calcium-vitamin D (OSCAL WITH D) 500-200 MG-UNIT tablet Take 1 tablet by mouth daily.   Marland Kitchen gabapentin (NEURONTIN) 800 MG tablet Take 800 mg by mouth 3 (three) times daily.  . hydroxypropyl methylcellulose / hypromellose (ISOPTO TEARS / GONIOVISC) 2.5 % ophthalmic solution Place 1 drop into both eyes 4 (four) times daily as needed for dry eyes.  Marland Kitchen levothyroxine (SYNTHROID) 100 MCG tablet Take 100 mcg by mouth daily before breakfast.  . Multiple Vitamin (MULTI-VITAMINS) TABS Take 1  tablet by mouth daily.  . polyvinyl alcohol (LIQUIFILM TEARS) 1.4 % ophthalmic solution Place 1 drop into both eyes 4 (four) times daily.  . propranolol (INDERAL) 10 MG tablet Take 10 mg by mouth daily.  . sodium chloride (OCEAN) 0.65 % SOLN nasal spray Place 2 sprays into both nostrils every 4 (four) hours.  . Triamcinolone Acetonide 0.025 % LOTN Apply topically.  Marland Kitchen Umbralisib Tosylate 200 MG TABS Take 400 mg by mouth daily.  Marland Kitchen zinc oxide 20 % ointment Apply 1 application topically as needed for irritation. To buttocks after every incontinent episode and as needed for redness. May keep at bedside.   No  facility-administered encounter medications on file as of 11/03/2020.    Review of Systems:  Review of Systems  Review of Systems  Constitutional: Negative for activity change, appetite change, chills, diaphoresis, fatigue and fever.  HENT: Negative for mouth sores, postnasal drip, rhinorrhea, sinus pain and sore throat.   Respiratory: Negative for apnea, cough, chest tightness, shortness of breath and wheezing.   Cardiovascular: Negative for chest pain, palpitations  Gastrointestinal: Negative for abdominal distention, abdominal pain, constipation, diarrhea, nausea and vomiting.  Genitourinary: Negative for dysuria and frequency.  Musculoskeletal: Negative for arthralgias, joint swelling and myalgias.  Skin: Negative for rash.  Neurological: Negative for dizziness, syncope, weakness, light-headedness and numbness.  Psychiatric/Behavioral: Negative for behavioral problems, confusion and sleep disturbance.     Health Maintenance  Topic Date Due  . COVID-19 Vaccine (4 - Booster for Moderna series) 02/12/2021  . TETANUS/TDAP  07/01/2028  . INFLUENZA VACCINE  Completed  . DEXA SCAN  Completed  . PNA vac Low Risk Adult  Completed    Physical Exam: Vitals:   11/04/20 0846  BP: (!) 106/50  Pulse: 93  Resp: 16  Temp: (!) 97.5 F (36.4 C)  Weight: 131 lb (59.4 kg)   Body mass index is 21.14 kg/m. Physical Exam Constitutional: Oriented to person, place, and time. Well-developed and well-nourished.  HENT:  Head: Normocephalic.  Mouth/Throat: Oropharynx is clear and moist.  Eyes: Pupils are equal, round, and reactive to light.  Neck: Neck supple.  Cardiovascular: Normal rate and normal heart sounds.  No murmur heard. Pulmonary/Chest: Effort normal and breath sounds normal. No respiratory distress. No wheezes. She has no rales.  Abdominal: Soft. Bowel sounds are normal. No distension. There is no tenderness. There is no rebound.  Musculoskeletal: Bilateral Moderate edema with  Chronic Changes Has few dried scabs in Both LE. But no redness or discharge  Lymphadenopathy: none Neurological: Alert and oriented to person, place, and time.  Skin: Skin is warm and dry.  Psychiatric: Normal mood and affect. Behavior is normal. Thought content normal.   Labs reviewed: Basic Metabolic Panel: Recent Labs    08/24/20 2006 08/27/20 0112 09/08/20 0000 10/14/20 1042  NA 138 137 139 144  K 4.8 3.5 4.2 4.2  CL 105 102 105 105  CO2 24 26 26* 31  GLUCOSE 93 99  --  131*  BUN 20 16 21  31*  CREATININE 0.76 0.88 1.0 1.21*  CALCIUM 8.6* 8.2* 8.0* 9.3  TSH  --   --  27.99*  --    Liver Function Tests: Recent Labs    06/27/20 1025 08/24/20 2006 09/08/20 0000 10/14/20 1042  AST 27 31 22 20   ALT 11 12 10 8   ALKPHOS 73 62 68 72  BILITOT 0.4 0.5  --  0.4  PROT 7.5 7.1  --  6.6  ALBUMIN 1.8* 1.9* 1.8*  2.3*   No results for input(s): LIPASE, AMYLASE in the last 8760 hours. No results for input(s): AMMONIA in the last 8760 hours. CBC: Recent Labs    08/24/20 2006 08/27/20 0112 09/08/20 0000 10/14/20 1042  WBC 40.0* 43.8* 51.6 55.9*  NEUTROABS 8.8* 2.7 3,560.00 3.9  HGB 11.3* 9.9* 10.4* 9.4*  HCT 35.1* 29.8* 32* 30.2*  MCV 106.0* 103.5*  --  109.4*  PLT 168 138* 170 213   Lipid Panel: No results for input(s): CHOL, HDL, LDLCALC, TRIG, CHOLHDL, LDLDIRECT in the last 8760 hours. No results found for: HGBA1C  Procedures since last visit: CT CHEST ABDOMEN PELVIS WO CONTRAST  Result Date: 10/19/2020 CLINICAL DATA:  Hematologic malignancy. EXAM: CT CHEST, ABDOMEN AND PELVIS WITHOUT CONTRAST TECHNIQUE: Multidetector CT imaging of the chest, abdomen and pelvis was performed following the standard protocol without IV contrast. COMPARISON:  December 28, 2017 FINDINGS: CT CHEST FINDINGS Cardiovascular: Moderate severity calcification of the aortic arch is seen. Normal heart size with mild coronary artery calcification. No pericardial effusion. Mediastinum/Nodes: There is  mild pretracheal lymphadenopathy. Thyroid gland, trachea, and esophagus demonstrate no significant findings. Lungs/Pleura: Mild to moderate severity areas of scarring and/or atelectasis are seen within the posterior aspects of the bilateral apices and posterior aspects of the bilateral lung bases. Small bilateral pleural effusions are noted, right greater than left. Mild pleural based calcification is seen along the posterolateral aspect of the left upper lobe. No pneumothorax is identified. Musculoskeletal: A chronic fracture deformity is seen involving the body of the sternum. Chronic lateral seventh and eighth right rib fractures are seen. Chronic anterolateral third, fourth, fifth and sixth left rib fractures are also noted. A metallic density right shoulder prosthesis is seen with associated streak artifact. Multilevel degenerative changes are noted throughout the thoracic spine. CT ABDOMEN PELVIS FINDINGS Hepatobiliary: A 1.3 cm diameter cyst is seen within the right lobe of the liver. Tiny gallstones are seen within the lumen of an otherwise normal-appearing gallbladder. Pancreas: Unremarkable. No pancreatic ductal dilatation or surrounding inflammatory changes. Spleen: The spleen is markedly enlarged. Adrenals/Urinary Tract: Adrenal glands are unremarkable. Kidneys are normal in size, without obstructing renal calculi or hydronephrosis. A 7 mm nonobstructing renal stone is seen within the lower pole of the right kidney. A 3 mm nonobstructing renal stone is seen within the mid to upper left kidney. Bladder is unremarkable. Stomach/Bowel: Stomach is within normal limits. Appendix appears normal. No evidence of bowel wall thickening, distention, or inflammatory changes. Noninflamed diverticula are seen throughout the large bowel. Vascular/Lymphatic: Aortic atherosclerosis. There is marked severity para-aortic and aortocaval lymphadenopathy. This is increased in severity when compared to the prior study and  limited in evaluation in the absence of intravenous contrast. Marked severity bilateral inguinal lymphadenopathy is also seen. The largest inguinal lymph node is seen on the left and measures approximately 2.8 cm x 1.8 cm. Reproductive: Small, stable parenchymal calcifications are seen within the uterus. Other: No abdominal wall hernia or abnormality. No abdominopelvic ascites. Musculoskeletal: Chronic fracture deformities are seen involving the right superior and right inferior pubic rami. Multilevel degenerative changes seen throughout the lumbar spine. IMPRESSION: 1. Marked severity para-aortic, aortocaval and bilateral inguinal lymphadenopathy, increased in severity when compared to the prior study and consistent with the patient's known history of B-cell lymphoma lymphoma. 2. Splenomegaly. 3. Small bilateral pleural effusions, right greater than left. 4. Mild to moderate severity biapical and bibasilar scarring and/or atelectasis. 5. Cholelithiasis without evidence of cholecystitis. 6. Bilateral nonobstructing renal calculi. 7. Colonic diverticulosis.  8. Chronic fracture deformities involving the body of the sternum, right superior and right inferior pubic rami and right superior and right inferior pubic rami. 9. Aortic atherosclerosis. Aortic Atherosclerosis (ICD10-I70.0). Electronically Signed   By: Virgina Norfolk M.D.   On: 10/19/2020 17:44    Assessment/Plan 1. Bilateral lower extremity edema She has failed Lasix before Her CT scan of abdomen shows Lymphadenopathy most likely cause of her edema Will start her on Torsemide for 1 week to see if it helps her edema Also will follow her BP closely Repeat BMP in 1 week 2. Lymphoma, marginal zone, lymph nodes of multiple sites [facility]) On Oral Therapy per Oncology Also on Anti viral and pneumocystis prophylaxis   3. Hyponatremia Repeat BMP on Diuretic  4. Hypothyroidism, unspecified type Last TSH in 1/22 was 8.3 per her PCP Dose was changed  recently Will follow with repeat TSH in 8 week  5. Recurrent falls Is now working with therapy  6. Chronic migraine without aura without status migrainosus, not intractable On Low dose of Inderal  7. Trigeminal neuralgia High dose of Neurontin  8 CKD Will repeat BMP on Diuretics 9 Anemia Due to her Lymphoma    Labs/tests ordered: BMP in 1 week

## 2020-11-07 ENCOUNTER — Telehealth: Payer: Self-pay | Admitting: *Deleted

## 2020-11-07 NOTE — Telephone Encounter (Signed)
Received labwork from patient living facility.  Reviewed with Dr Marin Olp.  No orders received.

## 2020-11-10 ENCOUNTER — Encounter: Payer: Self-pay | Admitting: Internal Medicine

## 2020-11-10 ENCOUNTER — Non-Acute Institutional Stay (SKILLED_NURSING_FACILITY): Payer: Medicare Other | Admitting: Internal Medicine

## 2020-11-10 DIAGNOSIS — R600 Localized edema: Secondary | ICD-10-CM | POA: Diagnosis not present

## 2020-11-10 DIAGNOSIS — E871 Hypo-osmolality and hyponatremia: Secondary | ICD-10-CM | POA: Diagnosis not present

## 2020-11-10 DIAGNOSIS — I10 Essential (primary) hypertension: Secondary | ICD-10-CM | POA: Diagnosis not present

## 2020-11-10 DIAGNOSIS — R296 Repeated falls: Secondary | ICD-10-CM

## 2020-11-10 DIAGNOSIS — G43709 Chronic migraine without aura, not intractable, without status migrainosus: Secondary | ICD-10-CM | POA: Diagnosis not present

## 2020-11-10 DIAGNOSIS — C8588 Other specified types of non-Hodgkin lymphoma, lymph nodes of multiple sites: Secondary | ICD-10-CM | POA: Diagnosis not present

## 2020-11-10 DIAGNOSIS — E039 Hypothyroidism, unspecified: Secondary | ICD-10-CM | POA: Diagnosis not present

## 2020-11-10 LAB — BASIC METABOLIC PANEL
BUN: 16 (ref 4–21)
CO2: 25 — AB (ref 13–22)
Chloride: 104 (ref 99–108)
Creatinine: 1.3 — AB (ref 0.5–1.1)
Glucose: 78
Potassium: 4.1 (ref 3.4–5.3)
Sodium: 137 (ref 137–147)

## 2020-11-10 LAB — COMPREHENSIVE METABOLIC PANEL: Calcium: 8 — AB (ref 8.7–10.7)

## 2020-11-10 NOTE — Progress Notes (Signed)
Location:    [facility] Room Number: 5 Place of Service:  SNF [phone]) Provider:  Veleta Miners MD  Cari Caraway, MD  Patient Care Team: Cari Caraway, MD as PCP - General (Family Medicine)  Extended Emergency Contact Information Primary Emergency Contact: Genia Plants Address: [address]          [place], [place] [address] Johnnette Litter of [place] Phone: [phone] Relation: Alanson Puls Secondary Emergency Contact: [name] Address: [address]          [place], [place] [address] Johnnette Litter of [place] Phone: [phone] Mobile Phone: [phone] Relation: Nephew  Code Status:  Full Code Goals of care: Advanced Directive information Advanced Directives 10/14/2020  Does Patient Have a Medical Advance Directive? No  Type of Advance Directive -  Does patient want to make changes to medical advance directive? No - Patient declined  Copy of [place] in Chart? -  Would patient like information on creating a medical advance directive? -     Chief Complaint  Patient presents with  . Acute Visit    Follow up of LE edema    HPI:  Pt is a 87 y.o. female seen today for an acute visit for Follow up of her LE edema and Skin wounds   Patient has a history of Relapsed Marginal Zone  lymphoma followed by oncology now on Oral therapy  osteoporosis,   hypothyroidism,  trigeminal neuralgia and migraines  She was admitted in SNF in 12/21 after sustaining injuries in her fall s/p  frontoparietal subarachnoid hemorrhage and Rib fractures She was discharged back to her Apartment but it seems she was unable to take care of herself.  Had few other falls mostly mechanical.  She states she loses her balance. Patient also has relapsed marginal zone lymphoma.  Follows with oncology and is now started on oral therapy.  Was seen week ago for LE edema and discharge from her leg  Edema due to Abdominal Lymphadenopathy with  Lymphoma Started on Demadex.  To see if that will help Since then patient has lost almost 10 pounds feels much better and her edema has improved a lot.  Her wounds are much drier now.      Past Medical History:  Diagnosis Date  . Cancer [facility])    non hodgkin lymphoma  . Chronic lymphocytic leukemia ([place])   . History of B-cell lymphoma 11/02/2015  . Migraines   . Osteoporosis   . Thyroid disease   . Trigeminal neuralgia of left side of face    Past Surgical History:  Procedure Laterality Date  . gamma knife     for trigeminal neuralgia  . SHOULDER SURGERY     Right shoulder replacement  . TONSILLECTOMY AND ADENOIDECTOMY    . WISDOM TOOTH EXTRACTION      Allergies  Allergen Reactions  . Penicillins Hives  . Carbamazepine Other (See Comments) and Hives    Turned purple from neck down and drowsy Other reaction(s): Other (See Comments) Turned purple from neck down and drowsy  . Pregabalin Other (See Comments)    Drowsy Other reaction(s): Confusion (intolerance) Drowsy    Allergies as of 11/10/2020      Reactions   Penicillins Hives   Carbamazepine Other (See Comments), Hives   Turned purple from neck down and drowsy Other reaction(s): Other (See Comments) Turned purple from neck down and drowsy   Pregabalin Other (See Comments)   Drowsy Other reaction(s): Confusion (intolerance) Drowsy  Medication List       Accurate as of November 10, 2020  2:47 PM. If you have any questions, ask your nurse or doctor.        STOP taking these medications   Biotin 10 MG Caps Stopped by: Virgie Dad, MD   calcium-vitamin D 500-200 MG-UNIT tablet Commonly known as: OSCAL WITH D Stopped by: Virgie Dad, MD   hydroxypropyl methylcellulose / hypromellose 2.5 % ophthalmic solution Commonly known as: ISOPTO TEARS / GONIOVISC Stopped by: Virgie Dad, MD   sodium chloride 0.65 % Soln nasal spray Commonly known as: OCEAN Stopped by: Virgie Dad, MD     TAKE  these medications   acetaminophen 500 MG tablet Commonly known as: TYLENOL Take 500 mg by mouth every 6 (six) hours.   aspirin 81 MG chewable tablet Chew 81 mg by mouth daily.   famciclovir 250 MG tablet Commonly known as: FAMVIR Take 1 tablet (250 mg total) by mouth daily.   gabapentin 800 MG tablet Commonly known as: NEURONTIN Take 800 mg by mouth 3 (three) times daily.   levothyroxine 100 MCG tablet Commonly known as: SYNTHROID Take 100 mcg by mouth daily before breakfast.   Multi-Vitamins Tabs Take 1 tablet by mouth daily.   polyvinyl alcohol 1.4 % ophthalmic solution Commonly known as: LIQUIFILM TEARS Place 1 drop into both eyes 4 (four) times daily.   potassium chloride 10 MEQ tablet Commonly known as: KLOR-CON Take 10 mEq by mouth daily.   propranolol 10 MG tablet Commonly known as: INDERAL Take 10 mg by mouth daily.   sulfamethoxazole-trimethoprim 800-160 MG tablet Commonly known as: BACTRIM DS Take 1 tablet by mouth daily.   torsemide 20 MG tablet Commonly known as: DEMADEX Take 20 mg by mouth daily.   Triamcinolone Acetonide 0.025 % Lotn Apply topically.   Umbralisib Tosylate 200 MG Tabs Take 400 mg by mouth daily.   zinc oxide 20 % ointment Apply 1 application topically as needed for irritation. To buttocks after every incontinent episode and as needed for redness. May keep at bedside.       Review of Systems  Constitutional: Negative.   HENT: Negative.   Respiratory: Negative.   Cardiovascular: Positive for leg swelling.  Gastrointestinal: Negative.   Genitourinary: Negative.   Musculoskeletal: Positive for gait problem.  Skin: Positive for wound.  Neurological: Positive for weakness.  Psychiatric/Behavioral: Negative.     Immunization History  Administered Date(s) Administered  . Fluad Quad(high Dose 65+) 05/28/2019  . Influenza Split 07/12/2016, 08/13/2017, 06/02/2019, 09/09/2020  . Influenza, High Dose Seasonal PF 06/11/2017,  06/28/2018  . Influenza-Unspecified 06/21/2020  . Moderna Sars-Covid-2 Vaccination 10/05/2019, 11/02/2019, 08/15/2020  . Pneumococcal Conjugate-13 11/22/2014  . Pneumococcal Polysaccharide-23 04/29/2009, 08/13/2017  . Td 01/24/2013  . Tdap 12/28/2017, 07/01/2018  . Zoster 11/12/2009, 07/01/2018  . Zoster Recombinat (Shingrix) 07/01/2018   Pertinent  Health Maintenance Due  Topic Date Due  . INFLUENZA VACCINE  Completed  . DEXA SCAN  Completed  . PNA vac Low Risk Adult  Completed   No flowsheet data found. Functional Status Survey:    Vitals:   11/10/20 1326  BP: (!) 106/48  Pulse: 98  Resp: 14  Temp: 98 F (36.7 C)  SpO2: 94%  Weight: 120 lb (54.4 kg)  Height: 5\' 6"  (1.676 m)   Body mass index is 19.37 kg/m. Physical Exam Vitals reviewed.  Constitutional:      Appearance: Normal appearance.  HENT:     Head: Normocephalic.  Nose: Nose normal.     Mouth/Throat:     Mouth: Mucous membranes are moist.     Pharynx: Oropharynx is clear.  Eyes:     Pupils: Pupils are equal, round, and reactive to light.  Cardiovascular:     Rate and Rhythm: Normal rate.     Pulses: Normal pulses.  Pulmonary:     Effort: Pulmonary effort is normal.  Abdominal:     General: Abdomen is flat. Bowel sounds are normal.     Palpations: Abdomen is soft.  Musculoskeletal:     Cervical back: Neck supple.     Comments: Still has edema Bilateral but better then before  Skin:    General: Skin is warm and dry.  Neurological:     General: No focal deficit present.     Mental Status: She is alert and oriented to person, place, and time.  Psychiatric:        Mood and Affect: Mood normal.        Thought Content: Thought content normal.     Labs reviewed: Recent Labs    08/24/20 2006 08/27/20 0112 09/08/20 0000 10/14/20 1042 11/04/20 0000  NA 138 137 139 144 138  K 4.8 3.5 4.2 4.2 3.8  CL 105 102 105 105 105  CO2 24 26 26* 31 25*  GLUCOSE 93 99  --  131*  --   BUN 20 16 21  31*  20  CREATININE 0.76 0.88 1.0 1.21* 1.2*  CALCIUM 8.6* 8.2* 8.0* 9.3 7.9*   Recent Labs    06/27/20 1025 08/24/20 2006 09/08/20 0000 10/14/20 1042 11/04/20 0000  AST 27 31 22 20 19   ALT 11 12 10 8 9   ALKPHOS 73 62 68 72 51  BILITOT 0.4 0.5  --  0.4  --   PROT 7.5 7.1  --  6.6  --   ALBUMIN 1.8* 1.9* 1.8* 2.3* 1.8*   Recent Labs    08/24/20 2006 08/27/20 0112 09/08/20 0000 10/14/20 1042 11/04/20 0000  WBC 40.0* 43.8* 51.6 55.9* 74.0  NEUTROABS 8.8* 2.7 3,560.00 3.9 2,886.00  HGB 11.3* 9.9* 10.4* 9.4* 8.6*  HCT 35.1* 29.8* 32* 30.2* 27*  MCV 106.0* 103.5*  --  109.4*  --   PLT 168 138* 170 213 201   Lab Results  Component Value Date   TSH 27.99 (A) 09/08/2020   No results found for: HGBA1C No results found for: CHOL, HDL, LDLCALC, LDLDIRECT, TRIG, CHOLHDL  Significant Diagnostic Results in last 30 days:  CT CHEST ABDOMEN PELVIS WO CONTRAST  Result Date: 10/19/2020 CLINICAL DATA:  Hematologic malignancy. EXAM: CT CHEST, ABDOMEN AND PELVIS WITHOUT CONTRAST TECHNIQUE: Multidetector CT imaging of the chest, abdomen and pelvis was performed following the standard protocol without IV contrast. COMPARISON:  December 28, 2017 FINDINGS: CT CHEST FINDINGS Cardiovascular: Moderate severity calcification of the aortic arch is seen. Normal heart size with mild coronary artery calcification. No pericardial effusion. Mediastinum/Nodes: There is mild pretracheal lymphadenopathy. Thyroid gland, trachea, and esophagus demonstrate no significant findings. Lungs/Pleura: Mild to moderate severity areas of scarring and/or atelectasis are seen within the posterior aspects of the bilateral apices and posterior aspects of the bilateral lung bases. Small bilateral pleural effusions are noted, right greater than left. Mild pleural based calcification is seen along the posterolateral aspect of the left upper lobe. No pneumothorax is identified. Musculoskeletal: A chronic fracture deformity is seen involving  the body of the sternum. Chronic lateral seventh and eighth right rib fractures are seen. Chronic anterolateral  third, fourth, fifth and sixth left rib fractures are also noted. A metallic density right shoulder prosthesis is seen with associated streak artifact. Multilevel degenerative changes are noted throughout the thoracic spine. CT ABDOMEN PELVIS FINDINGS Hepatobiliary: A 1.3 cm diameter cyst is seen within the right lobe of the liver. Tiny gallstones are seen within the lumen of an otherwise normal-appearing gallbladder. Pancreas: Unremarkable. No pancreatic ductal dilatation or surrounding inflammatory changes. Spleen: The spleen is markedly enlarged. Adrenals/Urinary Tract: Adrenal glands are unremarkable. Kidneys are normal in size, without obstructing renal calculi or hydronephrosis. A 7 mm nonobstructing renal stone is seen within the lower pole of the right kidney. A 3 mm nonobstructing renal stone is seen within the mid to upper left kidney. Bladder is unremarkable. Stomach/Bowel: Stomach is within normal limits. Appendix appears normal. No evidence of bowel wall thickening, distention, or inflammatory changes. Noninflamed diverticula are seen throughout the large bowel. Vascular/Lymphatic: Aortic atherosclerosis. There is marked severity para-aortic and aortocaval lymphadenopathy. This is increased in severity when compared to the prior study and limited in evaluation in the absence of intravenous contrast. Marked severity bilateral inguinal lymphadenopathy is also seen. The largest inguinal lymph node is seen on the left and measures approximately 2.8 cm x 1.8 cm. Reproductive: Small, stable parenchymal calcifications are seen within the uterus. Other: No abdominal wall hernia or abnormality. No abdominopelvic ascites. Musculoskeletal: Chronic fracture deformities are seen involving the right superior and right inferior pubic rami. Multilevel degenerative changes seen throughout the lumbar spine.  IMPRESSION: 1. Marked severity para-aortic, aortocaval and bilateral inguinal lymphadenopathy, increased in severity when compared to the prior study and consistent with the patient's known history of B-cell lymphoma lymphoma. 2. Splenomegaly. 3. Small bilateral pleural effusions, right greater than left. 4. Mild to moderate severity biapical and bibasilar scarring and/or atelectasis. 5. Cholelithiasis without evidence of cholecystitis. 6. Bilateral nonobstructing renal calculi. 7. Colonic diverticulosis. 8. Chronic fracture deformities involving the body of the sternum, right superior and right inferior pubic rami and right superior and right inferior pubic rami. 9. Aortic atherosclerosis. Aortic Atherosclerosis (ICD10-I70.0). Electronically Signed   By: Virgina Norfolk M.D.   On: 10/19/2020 17:44    Assessment/Plan Bilateral lower extremity edema Much better on Toresemide Will change the dose to 10 mg Repeat BMP are pending Lymphoma, marginal zone, lymph nodes of multiple sites [facility]) On therapy now Able to tolerate except complaining of some weakness Hyponatremia Repeat BMP pending Hypothyroidism, unspecified type Last TSH in 1/22 was 8.3 per her PCP Dose was changed recently Will follow with repeat TSH in 8 week Recurrent falls Is doing little better with her walking Chronic migraine without aura without status migrainosus, not intractable On Low dose of Inderal   Trigeminal neuralgia High dose of Neurontin  CKD Will repeat BMP on Diuretics  Anemia Due to her Lymphoma    Family/ staff Communication:   Labs/tests ordered:

## 2020-11-11 ENCOUNTER — Encounter: Payer: Self-pay | Admitting: Nurse Practitioner

## 2020-11-11 DIAGNOSIS — N183 Chronic kidney disease, stage 3 unspecified: Secondary | ICD-10-CM | POA: Insufficient documentation

## 2020-11-15 ENCOUNTER — Encounter: Payer: Self-pay | Admitting: Nurse Practitioner

## 2020-11-15 ENCOUNTER — Non-Acute Institutional Stay (SKILLED_NURSING_FACILITY): Payer: Medicare Other | Admitting: Nurse Practitioner

## 2020-11-15 DIAGNOSIS — W19XXXS Unspecified fall, sequela: Secondary | ICD-10-CM | POA: Diagnosis not present

## 2020-11-15 DIAGNOSIS — E039 Hypothyroidism, unspecified: Secondary | ICD-10-CM

## 2020-11-15 DIAGNOSIS — I10 Essential (primary) hypertension: Secondary | ICD-10-CM | POA: Diagnosis not present

## 2020-11-15 DIAGNOSIS — R269 Unspecified abnormalities of gait and mobility: Secondary | ICD-10-CM

## 2020-11-15 DIAGNOSIS — E871 Hypo-osmolality and hyponatremia: Secondary | ICD-10-CM | POA: Diagnosis not present

## 2020-11-15 DIAGNOSIS — G43001 Migraine without aura, not intractable, with status migrainosus: Secondary | ICD-10-CM | POA: Diagnosis not present

## 2020-11-15 DIAGNOSIS — N750 Cyst of Bartholin's gland: Secondary | ICD-10-CM

## 2020-11-15 DIAGNOSIS — C8588 Other specified types of non-Hodgkin lymphoma, lymph nodes of multiple sites: Secondary | ICD-10-CM

## 2020-11-15 DIAGNOSIS — N1831 Chronic kidney disease, stage 3a: Secondary | ICD-10-CM

## 2020-11-15 DIAGNOSIS — D630 Anemia in neoplastic disease: Secondary | ICD-10-CM | POA: Diagnosis not present

## 2020-11-15 DIAGNOSIS — I639 Cerebral infarction, unspecified: Secondary | ICD-10-CM | POA: Diagnosis not present

## 2020-11-15 DIAGNOSIS — G500 Trigeminal neuralgia: Secondary | ICD-10-CM

## 2020-11-15 DIAGNOSIS — M810 Age-related osteoporosis without current pathological fracture: Secondary | ICD-10-CM | POA: Diagnosis not present

## 2020-11-15 DIAGNOSIS — R600 Localized edema: Secondary | ICD-10-CM

## 2020-11-15 NOTE — Progress Notes (Signed)
Location:   S[facility] [place] Room Number: 5 Place of Service:  SNF (31) Provider: [facility] Lurleen Soltero NP  Virgie Dad, MD  Patient Care Team: Virgie Dad, MD as PCP - General (Internal Medicine)  Extended Emergency Contact Information Primary Emergency Contact: Genia Plants Address: [address]          [place], [place] [address] Johnnette Litter of [place] Phone: [phone] Relation: Alanson Puls Secondary Emergency Contact: [name] Address: [address]          [place], [place] [address] Johnnette Litter of [place] Phone: [phone] Mobile Phone: [phone] Relation: Nephew  Code Status:  DNR Goals of care: Advanced Directive information Advanced Directives 10/14/2020  Does Patient Have a Medical Advance Directive? No  Type of Advance Directive -  Does patient want to make changes to medical advance directive? No - Patient declined  Copy of [place] in Chart? -  Would patient like information on creating a medical advance directive? -     Chief Complaint  Patient presents with  . Medical Management of Chronic Issues    HPI:  Pt is a 87 y.o. female seen today for medical management of chronic diseases.    Edema BLE, reduced Torsemide to 10mg  qd 11/10/20, Bun/creat 16/1.3 11/10/20  Marginal zone lymphoma, f/u oncology, wbc 74 10/04/20 Hyponatremia  Na 137 11/10/20 Hypothyroidism, takes Levothyroxine, f/u TSH in 8 weeks schedule.  Chronic left cerebellar infarct, hx of, no apparent focal residual.  Gait abnormality, therapy.    Frequent falls  CKD, Bun/creat 16/1.3 11/10/20  Anemia, Hgb 8.6 11/04/20 OP DEXA 02/25/20 tscore -3.8 Migraines, takes Propranolol.  HTN recommended MAP range 70-90,  Trigeminal neuralgia, takes Gabpantin Bartholin gland cysts on each labia per report.    Past Medical History:  Diagnosis Date  . Cancer  [facility])    non hodgkin lymphoma  . Chronic lymphocytic leukemia ([place])   . History of B-cell lymphoma 11/02/2015  . Migraines   . Osteoporosis   . Thyroid disease   . Trigeminal neuralgia of left side of face    Past Surgical History:  Procedure Laterality Date  . gamma knife     for trigeminal neuralgia  . SHOULDER SURGERY     Right shoulder replacement  . TONSILLECTOMY AND ADENOIDECTOMY    . WISDOM TOOTH EXTRACTION      Allergies  Allergen Reactions  . Penicillins Hives  . Carbamazepine Other (See Comments) and Hives    Turned purple from neck down and drowsy Other reaction(s): Other (See Comments) Turned purple from neck down and drowsy  . Pregabalin Other (See Comments)    Drowsy Other reaction(s): Confusion (intolerance) Drowsy    Allergies as of 11/15/2020      Reactions   Penicillins Hives   Carbamazepine Other (See Comments), Hives   Turned purple from neck down and drowsy Other reaction(s): Other (See Comments) Turned purple from neck down and drowsy   Pregabalin Other (See Comments)   Drowsy Other reaction(s): Confusion (intolerance) Drowsy      Medication List       Accurate as of November 15, 2020 11:59 PM. If you have any questions, ask your nurse or doctor.        STOP taking these medications   potassium chloride 10 MEQ tablet Commonly known as: KLOR-CON Stopped by: Twylia Oka X Shivam Mestas, NP     TAKE these medications   acetaminophen 500 MG tablet Commonly known as: TYLENOL Take 500 mg by  mouth every 6 (six) hours.   aspirin 81 MG chewable tablet Chew 81 mg by mouth daily.   famciclovir 250 MG tablet Commonly known as: FAMVIR Take 1 tablet (250 mg total) by mouth daily.   gabapentin 800 MG tablet Commonly known as: NEURONTIN Take 800 mg by mouth 3 (three) times daily.   levothyroxine 100 MCG tablet Commonly known as: SYNTHROID Take 100 mcg by mouth daily before breakfast.   Multi-Vitamins Tabs Take 1 tablet by mouth daily.   polyvinyl  alcohol 1.4 % ophthalmic solution Commonly known as: LIQUIFILM TEARS Place 1 drop into both eyes 4 (four) times daily.   propranolol 10 MG tablet Commonly known as: INDERAL Take 10 mg by mouth daily.   sulfamethoxazole-trimethoprim 800-160 MG tablet Commonly known as: BACTRIM DS Take 1 tablet by mouth daily.   torsemide 20 MG tablet Commonly known as: DEMADEX Take 10 mg by mouth daily.   Triamcinolone Acetonide 0.025 % Lotn Apply topically.   Umbralisib Tosylate 200 MG Tabs Take 400 mg by mouth daily.   zinc oxide 20 % ointment Apply 1 application topically as needed for irritation. To buttocks after every incontinent episode and as needed for redness. May keep at bedside.       Review of Systems  Constitutional: Negative for fatigue, fever and unexpected weight change.  HENT: Positive for hearing loss. Negative for congestion, sinus pain and sore throat.   Eyes: Negative for visual disturbance.  Respiratory: Negative for cough, chest tightness and shortness of breath.   Cardiovascular: Positive for leg swelling. Negative for chest pain and palpitations.  Gastrointestinal: Negative for abdominal pain and constipation.  Genitourinary: Negative for difficulty urinating, dysuria, frequency and urgency.       Bartholin cysts.   Musculoskeletal: Positive for arthralgias and gait problem.       Ambulates with walker.   Skin: Negative for color change.  Neurological: Negative for speech difficulty, weakness, light-headedness and headaches.  Hematological: Bruises/bleeds easily.  Psychiatric/Behavioral: Negative for behavioral problems, confusion and sleep disturbance. The patient is not nervous/anxious.     Immunization History  Administered Date(s) Administered  . Fluad Quad(high Dose 65+) 05/28/2019  . Influenza Split 07/12/2016, 08/13/2017, 06/02/2019, 09/09/2020  . Influenza, High Dose Seasonal PF 06/11/2017, 06/28/2018  . Influenza-Unspecified 06/21/2020  . Moderna  Sars-Covid-2 Vaccination 10/05/2019, 11/02/2019, 08/15/2020  . Pneumococcal Conjugate-13 11/22/2014  . Pneumococcal Polysaccharide-23 04/29/2009, 08/13/2017  . Td 01/24/2013  . Tdap 12/28/2017, 07/01/2018  . Zoster 11/12/2009, 07/01/2018  . Zoster Recombinat (Shingrix) 07/01/2018   Pertinent  Health Maintenance Due  Topic Date Due  . INFLUENZA VACCINE  Completed  . DEXA SCAN  Completed  . PNA vac Low Risk Adult  Completed   No flowsheet data found. Functional Status Survey:    Vitals:   11/15/20 1607  BP: 127/64  Pulse: 76  Resp: 14  Temp: 97.6 F (36.4 C)  SpO2: 96%  Weight: 120 lb (54.4 kg)  Height: 5\' 6"  (1.676 m)   Body mass index is 19.37 kg/m. Physical Exam Vitals and nursing note reviewed.  Constitutional:      Appearance: Normal appearance.  HENT:     Nose: Nose normal.     Mouth/Throat:     Mouth: Mucous membranes are moist.  Eyes:     Extraocular Movements: Extraocular movements intact.     Conjunctiva/sclera: Conjunctivae normal.     Pupils: Pupils are equal, round, and reactive to light.  Cardiovascular:     Rate and Rhythm: Normal rate  and regular rhythm.     Heart sounds: No murmur heard.   Pulmonary:     Effort: Pulmonary effort is normal.     Breath sounds: Normal breath sounds. No wheezing, rhonchi or rales.  Abdominal:     General: Bowel sounds are normal.     Palpations: Abdomen is soft.     Tenderness: There is no abdominal tenderness.  Musculoskeletal:     Cervical back: Normal range of motion and neck supple.     Right lower leg: Edema present.     Left lower leg: Edema present.     Comments: 1+ edema BLE.   Skin:    General: Skin is warm and dry.  Neurological:     General: No focal deficit present.     Mental Status: She is alert and oriented to person, place, and time. Mental status is at baseline.     Gait: Gait abnormal.  Psychiatric:        Mood and Affect: Mood normal.        Behavior: Behavior normal.        Thought  Content: Thought content normal.        Judgment: Judgment normal.     Labs reviewed: Recent Labs    08/24/20 2006 08/27/20 0112 09/08/20 0000 10/14/20 1042 11/04/20 0000 11/10/20 0000  NA 138 137   < > 144 138 137  K 4.8 3.5   < > 4.2 3.8 4.1  CL 105 102   < > 105 105 104  CO2 24 26   < > 31 25* 25*  GLUCOSE 93 99  --  131*  --   --   BUN 20 16   < > 31* 20 16  CREATININE 0.76 0.88   < > 1.21* 1.2* 1.3*  CALCIUM 8.6* 8.2*   < > 9.3 7.9* 8.0*   < > = values in this interval not displayed.   Recent Labs    06/27/20 1025 08/24/20 2006 09/08/20 0000 10/14/20 1042 11/04/20 0000  AST 27 31 22 20 19   ALT 11 12 10 8 9   ALKPHOS 73 62 68 72 51  BILITOT 0.4 0.5  --  0.4  --   PROT 7.5 7.1  --  6.6  --   ALBUMIN 1.8* 1.9* 1.8* 2.3* 1.8*   Recent Labs    08/24/20 2006 08/27/20 0112 09/08/20 0000 10/14/20 1042 11/04/20 0000  WBC 40.0* 43.8* 51.6 55.9* 74.0  NEUTROABS 8.8* 2.7 3,560.00 3.9 2,886.00  HGB 11.3* 9.9* 10.4* 9.4* 8.6*  HCT 35.1* 29.8* 32* 30.2* 27*  MCV 106.0* 103.5*  --  109.4*  --   PLT 168 138* 170 213 201   Lab Results  Component Value Date   TSH 27.99 (A) 09/08/2020   No results found for: HGBA1C No results found for: CHOL, HDL, LDLCALC, LDLDIRECT, TRIG, CHOLHDL  Significant Diagnostic Results in last 30 days:  CT CHEST ABDOMEN PELVIS WO CONTRAST  Result Date: 10/19/2020 CLINICAL DATA:  Hematologic malignancy. EXAM: CT CHEST, ABDOMEN AND PELVIS WITHOUT CONTRAST TECHNIQUE: Multidetector CT imaging of the chest, abdomen and pelvis was performed following the standard protocol without IV contrast. COMPARISON:  December 28, 2017 FINDINGS: CT CHEST FINDINGS Cardiovascular: Moderate severity calcification of the aortic arch is seen. Normal heart size with mild coronary artery calcification. No pericardial effusion. Mediastinum/Nodes: There is mild pretracheal lymphadenopathy. Thyroid gland, trachea, and esophagus demonstrate no significant findings.  Lungs/Pleura: Mild to moderate severity areas of scarring and/or atelectasis are  seen within the posterior aspects of the bilateral apices and posterior aspects of the bilateral lung bases. Small bilateral pleural effusions are noted, right greater than left. Mild pleural based calcification is seen along the posterolateral aspect of the left upper lobe. No pneumothorax is identified. Musculoskeletal: A chronic fracture deformity is seen involving the body of the sternum. Chronic lateral seventh and eighth right rib fractures are seen. Chronic anterolateral third, fourth, fifth and sixth left rib fractures are also noted. A metallic density right shoulder prosthesis is seen with associated streak artifact. Multilevel degenerative changes are noted throughout the thoracic spine. CT ABDOMEN PELVIS FINDINGS Hepatobiliary: A 1.3 cm diameter cyst is seen within the right lobe of the liver. Tiny gallstones are seen within the lumen of an otherwise normal-appearing gallbladder. Pancreas: Unremarkable. No pancreatic ductal dilatation or surrounding inflammatory changes. Spleen: The spleen is markedly enlarged. Adrenals/Urinary Tract: Adrenal glands are unremarkable. Kidneys are normal in size, without obstructing renal calculi or hydronephrosis. A 7 mm nonobstructing renal stone is seen within the lower pole of the right kidney. A 3 mm nonobstructing renal stone is seen within the mid to upper left kidney. Bladder is unremarkable. Stomach/Bowel: Stomach is within normal limits. Appendix appears normal. No evidence of bowel wall thickening, distention, or inflammatory changes. Noninflamed diverticula are seen throughout the large bowel. Vascular/Lymphatic: Aortic atherosclerosis. There is marked severity para-aortic and aortocaval lymphadenopathy. This is increased in severity when compared to the prior study and limited in evaluation in the absence of intravenous contrast. Marked severity bilateral inguinal lymphadenopathy  is also seen. The largest inguinal lymph node is seen on the left and measures approximately 2.8 cm x 1.8 cm. Reproductive: Small, stable parenchymal calcifications are seen within the uterus. Other: No abdominal wall hernia or abnormality. No abdominopelvic ascites. Musculoskeletal: Chronic fracture deformities are seen involving the right superior and right inferior pubic rami. Multilevel degenerative changes seen throughout the lumbar spine. IMPRESSION: 1. Marked severity para-aortic, aortocaval and bilateral inguinal lymphadenopathy, increased in severity when compared to the prior study and consistent with the patient's known history of B-cell lymphoma lymphoma. 2. Splenomegaly. 3. Small bilateral pleural effusions, right greater than left. 4. Mild to moderate severity biapical and bibasilar scarring and/or atelectasis. 5. Cholelithiasis without evidence of cholecystitis. 6. Bilateral nonobstructing renal calculi. 7. Colonic diverticulosis. 8. Chronic fracture deformities involving the body of the sternum, right superior and right inferior pubic rami and right superior and right inferior pubic rami. 9. Aortic atherosclerosis. Aortic Atherosclerosis (ICD10-I70.0). Electronically Signed   By: Virgina Norfolk M.D.   On: 10/19/2020 17:44    Assessment/Plan Bilateral leg edema reduced Torsemide to 10mg  qd 11/10/20, Bun/creat 16/1.3 11/10/20   Lymphoma, marginal zone, lymph nodes of multiple sites [facility]) , f/u oncology, wbc 74 10/04/20   Hyponatremia Na 137 11/10/20  Hypothyroidism  takes Levothyroxine, f/u TSH in 8 weeks schedule.   Cerebellar infarct (HCC) chronic left cerebellar infarct, hx of, no apparent focal residual.   Gait abnormality Gait abnormality, therapy.   Fall Frequent falls.   CKD (chronic kidney disease) stage 3, GFR 30-59 ml/min (HCC) Bun/creat 16/1.3 11/10/20   Anemia in neoplastic disease Hgb 8.6 11/04/20   Osteoporosis  DEXA 02/25/20 tscore -3.8  Migraines  takes  Propranolol.    HTN (hypertension) Blood pressure is controlled, recommended MAP range 70-90  Trigeminal neuralgia takes Gabpantin  Bartholin gland cyst Bartholin gland cysts on each labia per report.     Family/ staff Communication: plan of care reviewed with the patient  and charge nurse.   Labs/tests ordered: none  Time spend 35 minutes.

## 2020-11-15 NOTE — Progress Notes (Signed)
This encounter was created in error - please disregard.  This encounter was created in error - please disregard.

## 2020-11-16 ENCOUNTER — Encounter: Payer: Self-pay | Admitting: Nurse Practitioner

## 2020-11-16 NOTE — Assessment & Plan Note (Signed)
chronic left cerebellar infarct, hx of, no apparent focal residual.

## 2020-11-16 NOTE — Assessment & Plan Note (Signed)
Na 137 11/10/20

## 2020-11-16 NOTE — Assessment & Plan Note (Signed)
DEXA 02/25/20 tscore -3.8 

## 2020-11-16 NOTE — Assessment & Plan Note (Signed)
Frequent falls 

## 2020-11-16 NOTE — Assessment & Plan Note (Signed)
Bun/creat 16/1.3 11/10/20

## 2020-11-16 NOTE — Assessment & Plan Note (Signed)
takes Levothyroxine, f/u TSH in 8 weeks schedule.

## 2020-11-16 NOTE — Assessment & Plan Note (Signed)
Hgb 8.6 11/04/20

## 2020-11-16 NOTE — Assessment & Plan Note (Signed)
takes Propranolol ?

## 2020-11-16 NOTE — Assessment & Plan Note (Signed)
Bartholin gland cysts on each labia per report.

## 2020-11-16 NOTE — Assessment & Plan Note (Signed)
,   f/u oncology, wbc 74 10/04/20

## 2020-11-16 NOTE — Assessment & Plan Note (Signed)
Blood pressure is controlled, recommended MAP range 70-90

## 2020-11-16 NOTE — Assessment & Plan Note (Signed)
reduced Torsemide to 10mg  qd 11/10/20, Bun/creat 16/1.3 11/10/20

## 2020-11-16 NOTE — Assessment & Plan Note (Signed)
takes [place]

## 2020-11-16 NOTE — Assessment & Plan Note (Signed)
Gait abnormality, therapy.

## 2020-11-30 ENCOUNTER — Non-Acute Institutional Stay (SKILLED_NURSING_FACILITY): Payer: Medicare Other | Admitting: Internal Medicine

## 2020-11-30 ENCOUNTER — Encounter: Payer: Self-pay | Admitting: Internal Medicine

## 2020-11-30 DIAGNOSIS — G500 Trigeminal neuralgia: Secondary | ICD-10-CM | POA: Diagnosis not present

## 2020-11-30 DIAGNOSIS — E039 Hypothyroidism, unspecified: Secondary | ICD-10-CM

## 2020-11-30 DIAGNOSIS — R600 Localized edema: Secondary | ICD-10-CM

## 2020-11-30 DIAGNOSIS — G43001 Migraine without aura, not intractable, with status migrainosus: Secondary | ICD-10-CM | POA: Diagnosis not present

## 2020-11-30 DIAGNOSIS — N1831 Chronic kidney disease, stage 3a: Secondary | ICD-10-CM | POA: Diagnosis not present

## 2020-11-30 DIAGNOSIS — D630 Anemia in neoplastic disease: Secondary | ICD-10-CM

## 2020-11-30 DIAGNOSIS — E871 Hypo-osmolality and hyponatremia: Secondary | ICD-10-CM | POA: Diagnosis not present

## 2020-11-30 DIAGNOSIS — C8588 Other specified types of non-Hodgkin lymphoma, lymph nodes of multiple sites: Secondary | ICD-10-CM

## 2020-11-30 NOTE — Progress Notes (Signed)
Location:    [facility] Room Number: 5 Place of Service:  SNF [phone])  Provider: Veleta Miners MD  PCP: Virgie Dad, MD Patient Care Team: Virgie Dad, MD as PCP - General (Internal Medicine)  Extended Emergency Contact Information Primary Emergency Contact: Genia Plants Address: [address]          [place], [place] [address] Johnnette Litter of [place] Phone: [phone] Relation: Nephew Secondary Emergency Contact: [name] Address: [address]          [place], [place] [address] Johnnette Litter of [place] Phone: [phone] Mobile Phone: [phone] Relation: Nephew  Code Status: Full Code Managed Care Goals of care:  Advanced Directive information Advanced Directives 10/14/2020  Does Patient Have a Medical Advance Directive? No  Type of Advance Directive -  Does patient want to make changes to medical advance directive? No - Patient declined  Copy of [place] in Chart? -  Would patient like information on creating a medical advance directive? -     Allergies  Allergen Reactions  . Penicillins Hives  . Carbamazepine Other (See Comments) and Hives    Turned purple from neck down and drowsy Other reaction(s): Other (See Comments) Turned purple from neck down and drowsy  . Pregabalin Other (See Comments)    Drowsy Other reaction(s): Confusion (intolerance) Drowsy    Chief Complaint  Patient presents with  . Discharge Note    Discharge from SNF    HPI:  87 y.o. female  For discharge from the SNF to AL  Patient has a history ofRelapsed Marginal Zonelymphoma followed by oncology now on Oral therapy osteoporosis,  hypothyroidism, trigeminal neuralgia and migraines  s/pfrontoparietal subarachnoid hemorrhageand Rib fractures  Admitted in SNF for therapy after having multiple falls in her apartment. She underwent therapy.  Is doing better still has some balancing issues.  For her lower  extremity edema she was started on Demadex.  She lost almost 10 pounds and her edema got better.  Her edema is related to abdominal lymphadenopathy with lymphoma. She is tolerating her new oral therapy. Is doing better and is now therapy discharged to assisted living.  She eventually wants to go back to her apartment   Past Medical History:  Diagnosis Date  . Cancer [facility])    non hodgkin lymphoma  . Chronic lymphocytic leukemia ([place])   . History of B-cell lymphoma 11/02/2015  . Migraines   . Osteoporosis   . Thyroid disease   . Trigeminal neuralgia of left side of face     Past Surgical History:  Procedure Laterality Date  . gamma knife     for trigeminal neuralgia  . SHOULDER SURGERY     Right shoulder replacement  . TONSILLECTOMY AND ADENOIDECTOMY    . WISDOM TOOTH EXTRACTION        reports that she quit smoking about 45 years ago. She has a 10.00 pack-year smoking history. She has never used smokeless tobacco. She reports current alcohol use of about 2.0 standard drinks of alcohol per week. She reports that she does not use drugs. Social History   Socioeconomic History  . Marital status: Divorced    Spouse name: Not on file  . Number of children: 0  . Years of education: Masters  . Highest education level: Not on file  Occupational History  . Occupation: Retired  Tobacco Use  . Smoking status: Former Smoker    Packs/day: 0.50    Years: 20.00  Pack years: 10.00    Quit date: 10/02/1975    Years since quitting: 45.1  . Smokeless tobacco: Never Used  Substance and Sexual Activity  . Alcohol use: Yes    Alcohol/week: 2.0 standard drinks    Types: 2 Glasses of wine per week  . Drug use: No  . Sexual activity: Never    Comment: divorced, no children, sister in law next of kin. Retired Actuary for [employer]  Other Topics Concern  . Not on file  Social History Narrative   Lives at home alone.   Right-handed.   4 cups caffeine per day.   Social Determinants  of Health   Financial Resource Strain: Not on file  Food Insecurity: Not on file  Transportation Needs: Not on file  Physical Activity: Not on file  Stress: Not on file  Social Connections: Not on file  Intimate Partner Violence: Not on file   Functional Status Survey:    Allergies  Allergen Reactions  . Penicillins Hives  . Carbamazepine Other (See Comments) and Hives    Turned purple from neck down and drowsy Other reaction(s): Other (See Comments) Turned purple from neck down and drowsy  . Pregabalin Other (See Comments)    Drowsy Other reaction(s): Confusion (intolerance) Drowsy    Pertinent  Health Maintenance Due  Topic Date Due  . INFLUENZA VACCINE  Completed  . DEXA SCAN  Completed  . PNA vac Low Risk Adult  Completed    Medications: Allergies as of 11/30/2020      Reactions   Penicillins Hives   Carbamazepine Other (See Comments), Hives   Turned purple from neck down and drowsy Other reaction(s): Other (See Comments) Turned purple from neck down and drowsy   Pregabalin Other (See Comments)   Drowsy Other reaction(s): Confusion (intolerance) Drowsy      Medication List       Accurate as of November 30, 2020  2:40 PM. If you have any questions, ask your nurse or doctor.        acetaminophen 500 MG tablet Commonly known as: TYLENOL Take 500 mg by mouth every 6 (six) hours.   aspirin 81 MG chewable tablet Chew 81 mg by mouth daily.   famciclovir 250 MG tablet Commonly known as: FAMVIR Take 1 tablet (250 mg total) by mouth daily.   gabapentin 800 MG tablet Commonly known as: NEURONTIN Take 800 mg by mouth 3 (three) times daily.   levothyroxine 100 MCG tablet Commonly known as: SYNTHROID Take 100 mcg by mouth daily before breakfast.   Multi-Vitamins Tabs Take 1 tablet by mouth daily.   polyvinyl alcohol 1.4 % ophthalmic solution Commonly known as: LIQUIFILM TEARS Place 1 drop into both eyes 4 (four) times daily.   Pro-Stat Liqd Take 30 mLs  by mouth daily.   propranolol 10 MG tablet Commonly known as: INDERAL Take 10 mg by mouth daily.   sulfamethoxazole-trimethoprim 800-160 MG tablet Commonly known as: BACTRIM DS Take 1 tablet by mouth daily.   torsemide 20 MG tablet Commonly known as: DEMADEX Take 10 mg by mouth daily.   Triamcinolone Acetonide 0.025 % Lotn Apply topically.   Umbralisib Tosylate 200 MG Tabs Take 400 mg by mouth daily.   zinc oxide 20 % ointment Apply 1 application topically as needed for irritation. To buttocks after every incontinent episode and as needed for redness. May keep at bedside.       Review of Systems  Vitals:   11/30/20 1431  BP: 114/62  Pulse: 93  Resp: 18  Temp: (!) 97.2 F (36.2 C)  SpO2: 90%  Weight: 120 lb (54.4 kg)  Height: 5\' 6"  (1.676 m)   Body mass index is 19.37 kg/m. Physical Exam  Constitutional: Oriented to person, place, and time. Well-developed and well-nourished.  HENT:  Head: Normocephalic.  Mouth/Throat: Oropharynx is clear and moist.  Eyes: Pupils are equal, round, and reactive to light.  Neck: Neck supple.  Cardiovascular: Normal rate and normal heart sounds.  No murmur heard. Pulmonary/Chest: Effort normal and breath sounds normal. No respiratory distress. No wheezes. She has no rales.  Abdominal: Soft. Bowel sounds are normal. No distension. There is no tenderness. There is no rebound.  Musculoskeletal: Mild edema with Chronic Venous changes Lymphadenopathy: none Neurological: Alert and oriented to person, place, and time.  Skin: Skin is warm and dry.  Psychiatric: Normal mood and affect. Behavior is normal. Thought content normal.    Labs reviewed: Basic Metabolic Panel: Recent Labs    08/24/20 2006 08/27/20 0112 09/08/20 0000 10/14/20 1042 11/04/20 0000 11/10/20 0000  NA 138 137   < > 144 138 137  K 4.8 3.5   < > 4.2 3.8 4.1  CL 105 102   < > 105 105 104  CO2 24 26   < > 31 25* 25*  GLUCOSE 93 99  --  131*  --   --   BUN 20  16   < > 31* 20 16  CREATININE 0.76 0.88   < > 1.21* 1.2* 1.3*  CALCIUM 8.6* 8.2*   < > 9.3 7.9* 8.0*   < > = values in this interval not displayed.   Liver Function Tests: Recent Labs    06/27/20 1025 08/24/20 2006 09/08/20 0000 10/14/20 1042 11/04/20 0000  AST 27 31 22 20 19   ALT 11 12 10 8 9   ALKPHOS 73 62 68 72 51  BILITOT 0.4 0.5  --  0.4  --   PROT 7.5 7.1  --  6.6  --   ALBUMIN 1.8* 1.9* 1.8* 2.3* 1.8*   No results for input(s): LIPASE, AMYLASE in the last 8760 hours. No results for input(s): AMMONIA in the last 8760 hours. CBC: Recent Labs    08/24/20 2006 08/27/20 0112 09/08/20 0000 10/14/20 1042 11/04/20 0000  WBC 40.0* 43.8* 51.6 55.9* 74.0  NEUTROABS 8.8* 2.7 3,560.00 3.9 2,886.00  HGB 11.3* 9.9* 10.4* 9.4* 8.6*  HCT 35.1* 29.8* 32* 30.2* 27*  MCV 106.0* 103.5*  --  109.4*  --   PLT 168 138* 170 213 201   Cardiac Enzymes: No results for input(s): CKTOTAL, CKMB, CKMBINDEX, TROPONINI in the last 8760 hours. BNP: Invalid input(s): POCBNP CBG: No results for input(s): GLUCAP in the last 8760 hours.  Procedures and Imaging Studies During Stay: No results found.  Assessment/Plan:   Bilateral leg edema Doing well on Demadex  Lymphoma, marginal zone, lymph nodes of multiple sites (HCC) Last CBC showed White count of 74 with Hgb of 8.6 She wants to  call Hematology Office for follow up by herself Is on Palliative treatment  Hyponatremia Sodium is stable  Hypothyroidism, unspecified type Last TSH in 1/22 was 8.3 per her PCP Dose was changed recently Will follow with repeat TSH in 8 week  Stage 3a chronic kidney disease ([place]) Creat slightly high   on Diuretics  Anemia in neoplastic disease Will Call Hematology for follow up  Migraine without aura and with status migrainosus, not intractable On Propanolol Trigeminal neuralgia On  High dose of neurontin Recurrent falls Is doing little better with her walking  Patient is being discharged with  the following home health services:    Patient is being discharged with the following durable medical equipment:    Patient has been advised to f/u with their PCP in 1-2 weeks to bring them up to date on their rehab stay.  Social services at facility was responsible for arranging this appointment.  Pt was provided with a 30 day supply of prescriptions for medications and refills must be obtained from their PCP.  For controlled substances, a more limited supply may be provided adequate until PCP appointment only.  Future labs/tests needed:

## 2020-12-05 DIAGNOSIS — R2681 Unsteadiness on feet: Secondary | ICD-10-CM | POA: Diagnosis not present

## 2020-12-05 DIAGNOSIS — R293 Abnormal posture: Secondary | ICD-10-CM | POA: Diagnosis not present

## 2020-12-05 DIAGNOSIS — Z9181 History of falling: Secondary | ICD-10-CM | POA: Diagnosis not present

## 2020-12-05 DIAGNOSIS — M6281 Muscle weakness (generalized): Secondary | ICD-10-CM | POA: Diagnosis not present

## 2020-12-06 DIAGNOSIS — Z9181 History of falling: Secondary | ICD-10-CM | POA: Diagnosis not present

## 2020-12-06 DIAGNOSIS — R293 Abnormal posture: Secondary | ICD-10-CM | POA: Diagnosis not present

## 2020-12-06 DIAGNOSIS — R2681 Unsteadiness on feet: Secondary | ICD-10-CM | POA: Diagnosis not present

## 2020-12-06 DIAGNOSIS — M6281 Muscle weakness (generalized): Secondary | ICD-10-CM | POA: Diagnosis not present

## 2020-12-07 DIAGNOSIS — R293 Abnormal posture: Secondary | ICD-10-CM | POA: Diagnosis not present

## 2020-12-07 DIAGNOSIS — Z9181 History of falling: Secondary | ICD-10-CM | POA: Diagnosis not present

## 2020-12-07 DIAGNOSIS — M6281 Muscle weakness (generalized): Secondary | ICD-10-CM | POA: Diagnosis not present

## 2020-12-07 DIAGNOSIS — R2681 Unsteadiness on feet: Secondary | ICD-10-CM | POA: Diagnosis not present

## 2020-12-08 ENCOUNTER — Telehealth: Payer: Self-pay

## 2020-12-08 NOTE — Telephone Encounter (Signed)
Pt not feeing well and has r/s 3/11 appt to next week

## 2020-12-09 ENCOUNTER — Inpatient Hospital Stay: Payer: Medicare Other | Admitting: Family

## 2020-12-09 ENCOUNTER — Inpatient Hospital Stay: Payer: Medicare Other

## 2020-12-09 DIAGNOSIS — M6281 Muscle weakness (generalized): Secondary | ICD-10-CM | POA: Diagnosis not present

## 2020-12-09 DIAGNOSIS — R2681 Unsteadiness on feet: Secondary | ICD-10-CM | POA: Diagnosis not present

## 2020-12-09 DIAGNOSIS — R293 Abnormal posture: Secondary | ICD-10-CM | POA: Diagnosis not present

## 2020-12-09 DIAGNOSIS — Z9181 History of falling: Secondary | ICD-10-CM | POA: Diagnosis not present

## 2020-12-12 ENCOUNTER — Other Ambulatory Visit: Payer: Self-pay | Admitting: Family

## 2020-12-12 DIAGNOSIS — R2681 Unsteadiness on feet: Secondary | ICD-10-CM | POA: Diagnosis not present

## 2020-12-12 DIAGNOSIS — Z9181 History of falling: Secondary | ICD-10-CM | POA: Diagnosis not present

## 2020-12-12 DIAGNOSIS — M6281 Muscle weakness (generalized): Secondary | ICD-10-CM | POA: Diagnosis not present

## 2020-12-12 DIAGNOSIS — C8588 Other specified types of non-Hodgkin lymphoma, lymph nodes of multiple sites: Secondary | ICD-10-CM

## 2020-12-12 DIAGNOSIS — Z8572 Personal history of non-Hodgkin lymphomas: Secondary | ICD-10-CM

## 2020-12-12 DIAGNOSIS — R293 Abnormal posture: Secondary | ICD-10-CM | POA: Diagnosis not present

## 2020-12-13 ENCOUNTER — Encounter (HOSPITAL_COMMUNITY): Payer: Self-pay

## 2020-12-13 ENCOUNTER — Other Ambulatory Visit: Payer: Self-pay

## 2020-12-13 ENCOUNTER — Telehealth: Payer: Self-pay | Admitting: *Deleted

## 2020-12-13 ENCOUNTER — Telehealth: Payer: Self-pay

## 2020-12-13 ENCOUNTER — Inpatient Hospital Stay (HOSPITAL_BASED_OUTPATIENT_CLINIC_OR_DEPARTMENT_OTHER): Payer: Medicare Other | Admitting: Family

## 2020-12-13 ENCOUNTER — Emergency Department (HOSPITAL_COMMUNITY)
Admission: EM | Admit: 2020-12-13 | Discharge: 2020-12-13 | Disposition: A | Discharge status: Home / Self Care | Payer: Medicare Other | Attending: Emergency Medicine | Admitting: Emergency Medicine

## 2020-12-13 ENCOUNTER — Emergency Department (HOSPITAL_COMMUNITY): Payer: Medicare Other

## 2020-12-13 ENCOUNTER — Encounter: Payer: Self-pay | Admitting: Family

## 2020-12-13 ENCOUNTER — Inpatient Hospital Stay: Payer: Medicare Other | Attending: Family

## 2020-12-13 VITALS — BP 93/47 | HR 46 | Temp 97.9°F | Resp 19 | Ht 66.0 in | Wt 121.0 lb

## 2020-12-13 DIAGNOSIS — R6889 Other general symptoms and signs: Secondary | ICD-10-CM | POA: Diagnosis not present

## 2020-12-13 DIAGNOSIS — Z87891 Personal history of nicotine dependence: Secondary | ICD-10-CM | POA: Diagnosis not present

## 2020-12-13 DIAGNOSIS — W19XXXA Unspecified fall, initial encounter: Secondary | ICD-10-CM | POA: Diagnosis not present

## 2020-12-13 DIAGNOSIS — N183 Chronic kidney disease, stage 3 unspecified: Secondary | ICD-10-CM | POA: Insufficient documentation

## 2020-12-13 DIAGNOSIS — C8588 Other specified types of non-Hodgkin lymphoma, lymph nodes of multiple sites: Secondary | ICD-10-CM

## 2020-12-13 DIAGNOSIS — S0003XA Contusion of scalp, initial encounter: Secondary | ICD-10-CM

## 2020-12-13 DIAGNOSIS — Z8572 Personal history of non-Hodgkin lymphomas: Secondary | ICD-10-CM

## 2020-12-13 DIAGNOSIS — Z23 Encounter for immunization: Secondary | ICD-10-CM | POA: Diagnosis not present

## 2020-12-13 DIAGNOSIS — E039 Hypothyroidism, unspecified: Secondary | ICD-10-CM | POA: Diagnosis not present

## 2020-12-13 DIAGNOSIS — E8809 Other disorders of plasma-protein metabolism, not elsewhere classified: Secondary | ICD-10-CM | POA: Diagnosis not present

## 2020-12-13 DIAGNOSIS — Z79899 Other long term (current) drug therapy: Secondary | ICD-10-CM | POA: Insufficient documentation

## 2020-12-13 DIAGNOSIS — Z043 Encounter for examination and observation following other accident: Secondary | ICD-10-CM | POA: Diagnosis not present

## 2020-12-13 DIAGNOSIS — Z7982 Long term (current) use of aspirin: Secondary | ICD-10-CM | POA: Insufficient documentation

## 2020-12-13 DIAGNOSIS — R0902 Hypoxemia: Secondary | ICD-10-CM | POA: Diagnosis not present

## 2020-12-13 DIAGNOSIS — W01198A Fall on same level from slipping, tripping and stumbling with subsequent striking against other object, initial encounter: Secondary | ICD-10-CM | POA: Insufficient documentation

## 2020-12-13 DIAGNOSIS — S51811A Laceration without foreign body of right forearm, initial encounter: Secondary | ICD-10-CM | POA: Diagnosis not present

## 2020-12-13 DIAGNOSIS — Z743 Need for continuous supervision: Secondary | ICD-10-CM | POA: Diagnosis not present

## 2020-12-13 DIAGNOSIS — S59911A Unspecified injury of right forearm, initial encounter: Secondary | ICD-10-CM | POA: Diagnosis present

## 2020-12-13 DIAGNOSIS — I129 Hypertensive chronic kidney disease with stage 1 through stage 4 chronic kidney disease, or unspecified chronic kidney disease: Secondary | ICD-10-CM | POA: Insufficient documentation

## 2020-12-13 LAB — CBC WITH DIFFERENTIAL (CANCER CENTER ONLY)
Abs Immature Granulocytes: 0.18 10*3/uL — ABNORMAL HIGH (ref 0.00–0.07)
Basophils Absolute: 0.1 10*3/uL (ref 0.0–0.1)
Basophils Relative: 0 %
Eosinophils Absolute: 0.4 10*3/uL (ref 0.0–0.5)
Eosinophils Relative: 0 %
HCT: 33.8 % — ABNORMAL LOW (ref 36.0–46.0)
Hemoglobin: 10.6 g/dL — ABNORMAL LOW (ref 12.0–15.0)
Immature Granulocytes: 0 %
Lymphocytes Relative: 94 %
Lymphs Abs: 86 10*3/uL — ABNORMAL HIGH (ref 0.7–4.0)
MCH: 33.9 pg (ref 26.0–34.0)
MCHC: 31.4 g/dL (ref 30.0–36.0)
MCV: 108 fL — ABNORMAL HIGH (ref 80.0–100.0)
Monocytes Absolute: 1.1 10*3/uL — ABNORMAL HIGH (ref 0.1–1.0)
Monocytes Relative: 1 %
Neutro Abs: 5 10*3/uL (ref 1.7–7.7)
Neutrophils Relative %: 5 %
Platelet Count: 234 10*3/uL (ref 150–400)
RBC: 3.13 MIL/uL — ABNORMAL LOW (ref 3.87–5.11)
RDW: 15.7 % — ABNORMAL HIGH (ref 11.5–15.5)
WBC Count: 92.6 10*3/uL (ref 4.0–10.5)
nRBC: 0 % (ref 0.0–0.2)

## 2020-12-13 LAB — LACTATE DEHYDROGENASE: LDH: 192 U/L (ref 98–192)

## 2020-12-13 LAB — CMP (CANCER CENTER ONLY)
ALT: 8 U/L (ref 0–44)
AST: 20 U/L (ref 15–41)
Albumin: 2.6 g/dL — ABNORMAL LOW (ref 3.5–5.0)
Alkaline Phosphatase: 49 U/L (ref 38–126)
Anion gap: 9 (ref 5–15)
BUN: 41 mg/dL — ABNORMAL HIGH (ref 8–23)
CO2: 29 mmol/L (ref 22–32)
Calcium: 9.7 mg/dL (ref 8.9–10.3)
Chloride: 100 mmol/L (ref 98–111)
Creatinine: 1.6 mg/dL — ABNORMAL HIGH (ref 0.44–1.00)
GFR, Estimated: 30 mL/min — ABNORMAL LOW (ref >60)
Glucose, Bld: 101 mg/dL — ABNORMAL HIGH (ref 70–99)
Potassium: 4.6 mmol/L (ref 3.5–5.1)
Sodium: 138 mmol/L (ref 135–145)
Total Bilirubin: 0.2 mg/dL — ABNORMAL LOW (ref 0.3–1.2)
Total Protein: 6.7 g/dL (ref 6.5–8.1)

## 2020-12-13 MED ORDER — TETANUS-DIPHTH-ACELL PERTUSSIS 5-2.5-18.5 LF-MCG/0.5 IM SUSY
0.5000 mL | PREFILLED_SYRINGE | Freq: Once | INTRAMUSCULAR | Status: AC
  Administered 2020-12-13: 0.5 mL via INTRAMUSCULAR
  Filled 2020-12-13: qty 0.5

## 2020-12-13 MED ORDER — BACITRACIN ZINC 500 UNIT/GM EX OINT
1.0000 "application " | TOPICAL_OINTMENT | Freq: Two times a day (BID) | CUTANEOUS | Status: DC
  Administered 2020-12-13: 1 via TOPICAL
  Filled 2020-12-13: qty 0.9

## 2020-12-13 NOTE — ED Triage Notes (Signed)
Per EMS- patient was bending over and then she fell backwards. Patient has a hematoma to the back of the head. patient has a skin tear to the right wrist area. EMS placed a c collar on the patient. No LOC. No blood thinners.

## 2020-12-13 NOTE — ED Provider Notes (Signed)
[facility] DEPT Provider Note   CSN: [id number] Arrival date & time: 12/13/20  1532     History Chief Complaint  Patient presents with  . Fall  . hematoma    Nelly Scriven is a 87 y.o. female.  87 year old female with past medical history below including CLL, B cell lymphoma, CKD, migraines, osteoporosis who p/w fall.  Just prior to arrival, the patient was bending over when she lost her balance and fell backwards, landing on her right side and bumping the right side of her head.  She did not lose consciousness and states aside from some soreness on her head, she has no other areas of pain.  She denies any vomiting, blurry vision, ataxia, or confusion.  No anticoagulant use.  No neck pain.  She sustained a skin tear on her right forearm but denies any pain in this area.  The history is provided by the patient.  Fall       Past Medical History:  Diagnosis Date  . Cancer [facility])    non hodgkin lymphoma  . Chronic lymphocytic leukemia ([place])   . History of B-cell lymphoma 11/02/2015  . Migraines   . Osteoporosis   . Thyroid disease   . Trigeminal neuralgia of left side of face     Patient Active Problem List   Diagnosis Date Noted  . CKD (chronic kidney disease) stage 3, GFR 30-59 ml/min (HCC) 11/11/2020  . Hypoalbuminemia 10/17/2020  . Left lower lobe pneumonia 09/13/2020  . Left rib fracture 09/13/2020  . Hyponatremia 08/30/2020  . Hypothyroidism 08/30/2020  . Cerebellar infarct ([place]) 08/30/2020  . Gait abnormality 08/30/2020  . Osteoporosis 08/30/2020  . Migraines 08/30/2020  . HTN (hypertension) 08/30/2020  . Bartholin gland cyst 08/30/2020  . Left-sided chest wall pain 08/30/2020  . Dry eyes 08/30/2020  . Pressure injury of skin 08/28/2020  . SAH (subarachnoid hemorrhage) ([place]) 08/25/2020  . Subarachnoid hemorrhage ([place]) 08/24/2020  . Fall 08/24/2020  . Macrocytic anemia 08/24/2020  . Bilateral leg edema 03/07/2020  . Right  shoulder pain 05/24/2017  . Pancytopenia, acquired ([place]) 02/12/2017  . Other constipation 02/12/2017  . Goals of care, counseling/discussion 01/24/2017  . Lymphoma, marginal zone, lymph nodes of multiple sites ([place]) 12/24/2016  . Anemia in neoplastic disease 12/24/2016  . Trigeminal neuralgia 08/13/2016  . Elevated total protein 06/26/2016  . History of B-cell lymphoma 11/02/2015    Past Surgical History:  Procedure Laterality Date  . gamma knife     for trigeminal neuralgia  . SHOULDER SURGERY     Right shoulder replacement  . TONSILLECTOMY AND ADENOIDECTOMY    . WISDOM TOOTH EXTRACTION       OB History   No obstetric history on file.     Family History  Problem Relation Age of Onset  . Cancer Father        unknown ca  . Pneumonia Father   . Cancer Brother        prostate ca  . Stroke Brother   . Heart attack Mother     Social History   Tobacco Use  . Smoking status: Former Smoker    Packs/day: 0.50    Years: 20.00    Pack years: 10.00    Quit date: 10/02/1975    Years since quitting: 45.2  . Smokeless tobacco: Never Used  Vaping Use  . Vaping Use: Never used  Substance Use Topics  . Alcohol use: Yes    Alcohol/week: 2.0 standard drinks  Types: 2 Glasses of wine per week  . Drug use: No    Home Medications Prior to Admission medications   Medication Sig Start Date End Date Taking? Authorizing Provider  acetaminophen (TYLENOL) 500 MG tablet Take 500 mg by mouth every 6 (six) hours.    [provider]  Amino Acids-Protein Hydrolys (PRO-STAT) LIQD Take 30 mLs by mouth daily.    [provider]  aspirin 81 MG chewable tablet Chew 81 mg by mouth daily.    [provider]  famciclovir (FAMVIR) 250 MG tablet Take 1 tablet (250 mg total) by mouth daily. 10/20/20   Volanda Napoleon, MD  gabapentin (NEURONTIN) 800 MG tablet Take 800 mg by mouth 3 (three) times daily. 06/13/20   [provider]  levothyroxine (SYNTHROID) 100 MCG  tablet Take 100 mcg by mouth daily before breakfast.    [provider]  Multiple Vitamin (MULTI-VITAMINS) TABS Take 1 tablet by mouth daily.    [provider]  polyvinyl alcohol (LIQUIFILM TEARS) 1.4 % ophthalmic solution Place 1 drop into both eyes 4 (four) times daily.    [provider]  propranolol (INDERAL) 10 MG tablet Take 10 mg by mouth daily.    [provider]  sulfamethoxazole-trimethoprim (BACTRIM DS) 800-160 MG tablet Take 1 tablet by mouth daily. 10/20/20   Volanda Napoleon, MD  torsemide (DEMADEX) 20 MG tablet Take 10 mg by mouth daily. 11/03/20 11/30/20  [provider]  Triamcinolone Acetonide 0.025 % LOTN Apply topically. 10/10/20   [provider]  Umbralisib Tosylate 200 MG TABS Take 400 mg by mouth daily. 10/17/20   Volanda Napoleon, MD  zinc oxide 20 % ointment Apply 1 application topically as needed for irritation. To buttocks after every incontinent episode and as needed for redness. May keep at bedside.    [provider]    Allergies    Penicillins, Carbamazepine, and Pregabalin  Review of Systems   Review of Systems All other systems reviewed and are negative except that which was mentioned in HPI  Physical Exam Updated Vital Signs BP 123/69   Pulse 91   Temp 97.9 F (36.6 C) (Oral)   Resp 14   SpO2 91%   Physical Exam Constitutional:      General: She is not in acute distress.    Appearance: Normal appearance.  HENT:     Head: Normocephalic.     Comments: Hematoma with abrasion R parietal scalp    Nose: Nose normal.     Mouth/Throat:     Mouth: Mucous membranes are moist.     Pharynx: Oropharynx is clear.  Eyes:     Extraocular Movements: Extraocular movements intact.     Conjunctiva/sclera: Conjunctivae normal.     Pupils: Pupils are equal, round, and reactive to light.  Cardiovascular:     Rate and Rhythm: Normal rate and regular rhythm.     Heart sounds: Normal heart sounds. No murmur  heard.   Pulmonary:     Effort: Pulmonary effort is normal.     Breath sounds: Normal breath sounds.  Abdominal:     General: Abdomen is flat. Bowel sounds are normal. There is no distension.     Palpations: Abdomen is soft.     Tenderness: There is no abdominal tenderness.  Musculoskeletal:        General: No tenderness. Normal range of motion.     Cervical back: Normal range of motion and neck supple. No tenderness.  Right lower leg: No edema.     Left lower leg: No edema.  Skin:    General: Skin is warm and dry.     Comments: Large skin tear R dorsal forearm w/ steristrips in place  Neurological:     General: No focal deficit present.     Mental Status: She is alert and oriented to person, place, and time.     Cranial Nerves: No cranial nerve deficit.     Motor: No weakness.     Coordination: Coordination normal.     Comments: fluent  Psychiatric:        Mood and Affect: Mood normal.        Behavior: Behavior normal.     ED Results / Procedures / Treatments   Labs (all labs ordered are listed, but only abnormal results are displayed) Labs Reviewed - No data to display  EKG None  Radiology CT Head Wo Contrast  Result Date: 12/13/2020 CLINICAL DATA:  Fall EXAM: CT HEAD WITHOUT CONTRAST TECHNIQUE: Contiguous axial images were obtained from the base of the skull through the vertex without intravenous contrast. COMPARISON:  08/25/2020 FINDINGS: Brain: There is no acute intracranial hemorrhage, mass effect, or edema. Gray-white differentiation is preserved. There is no extra-axial fluid collection. Patchy hypoattenuation in the supratentorial white matter is nonspecific but probably reflects stable chronic microvascular ischemic changes. Prominence of the ventricles and sulci reflects stable parenchymal volume loss. Vascular: There is atherosclerotic calcification at the skull base. Skull: Calvarium is unremarkable. Sinuses/Orbits: No acute finding. Other: Right posterior  scalp hematoma. IMPRESSION: No evidence of acute intracranial injury. Stable chronic/nonemergent findings detailed above. Electronically Signed   By: Macy Mis M.D.   On: 12/13/2020 16:19   CT Cervical Spine Wo Contrast  Result Date: 12/13/2020 CLINICAL DATA:  Fall EXAM: CT CERVICAL SPINE WITHOUT CONTRAST TECHNIQUE: Multidetector CT imaging of the cervical spine was performed without intravenous contrast. Multiplanar CT image reconstructions were also generated. COMPARISON:  08/24/2020 FINDINGS: Alignment: Stable. Skull base and vertebrae: Stable vertebral body heights. No acute fracture. Soft tissues and spinal canal: No prevertebral fluid or swelling. No visible canal hematoma. Disc levels: Multilevel degenerative changes are present including disc space narrowing, endplate osteophytes, and facet and uncovertebral hypertrophy. Appearance is similar to prior study. Upper chest: Scarring at the lung apices. Other: Calcified plaque at the common carotid bifurcations. IMPRESSION: No acute cervical spine fracture. Electronically Signed   By: Macy Mis M.D.   On: 12/13/2020 16:15    Procedures Procedures   Medications Ordered in ED Medications  bacitracin ointment 1 application (1 application Topical Given by Other 12/13/20 1656)    ED Course  I have reviewed the triage vital signs and the nursing notes.  Pertinent labs & imaging results that were available during my care of the patient were reviewed by me and considered in my medical decision making (see chart for details).    MDM Rules/Calculators/A&P                          Well-appearing and neurologically intact on exam with normal vital signs.  No significant complaints.  CT of head and cervical spine negative for acute injury.  Applied bandage on skin tear and discussed wound care.  Reviewed return precautions regarding her wound as well as her head injury.  Patient voiced understanding.  Updated Tdap prior to discharge. Final  Clinical Impression(s) / ED Diagnoses Final diagnoses:  Hematoma of scalp, initial encounter  Skin tear of right forearm without complication, initial encounter    Rx / DC Orders ED Discharge Orders    None       Chaney Maclaren, Wenda Overland, MD 12/13/20 1712

## 2020-12-13 NOTE — Progress Notes (Signed)
Hematology and Oncology Follow Up Visit  Joy Lynch [id number] [birth year] 87 y.o. 12/13/2020   Principle Diagnosis:  Marginal Zone lymphoma -- relapsed  Current Therapy:        Umbralisib 400 mg po q day, started on 10/22/2020   Interim History:  Joy Lynch is here today with her nephew for follow-up. She is feeling so much better since her last visit. The swelling in her lower extremities has resolved. Her energy has improved and she is ambulating nicely with a walker and no assistance.  She states that she has been taking her Umbralisib, Famvir and Bactrim DS daily as prescribed.  However her WBC count is up to 92, lymphocytes are 94%.  Her BUN is 41 and Creatinine 1.60. LDH is down to 192.  Light chains and immunoglobulins are pending. Her IgM level 2 months ago was 3,313.  She has not noted any blood loss. No abnormal bruising, no petechiae.  No fever, chills, n/v, cough, rash, dizziness, SOB, chest pain, palpitations, abdominal pain or changes in bowel or bladder habits.  No tenderness, numbness or tingling in her extremities at this time.  No hot flashes or night sweats.  No falls or syncope to report.  She has maintained a good appetite and is staying well hydrated. Her weight is stable at 121 lbs.   ECOG Performance Status: 1 - Symptomatic but completely ambulatory  Medications:  Allergies as of 12/13/2020      Reactions   Penicillins Hives   Carbamazepine Other (See Comments), Hives   Turned purple from neck down and drowsy Other reaction(s): Other (See Comments) Turned purple from neck down and drowsy   Pregabalin Other (See Comments)   Drowsy Other reaction(s): Confusion (intolerance) Drowsy      Medication List       Accurate as of December 13, 2020 10:43 AM. If you have any questions, ask your nurse or doctor.        acetaminophen 500 MG tablet Commonly known as: TYLENOL Take 500 mg by mouth every 6 (six) hours.   aspirin 81 MG chewable tablet Chew 81 mg  by mouth daily.   famciclovir 250 MG tablet Commonly known as: FAMVIR Take 1 tablet (250 mg total) by mouth daily.   gabapentin 800 MG tablet Commonly known as: NEURONTIN Take 800 mg by mouth 3 (three) times daily.   levothyroxine 100 MCG tablet Commonly known as: SYNTHROID Take 100 mcg by mouth daily before breakfast.   Multi-Vitamins Tabs Take 1 tablet by mouth daily.   polyvinyl alcohol 1.4 % ophthalmic solution Commonly known as: LIQUIFILM TEARS Place 1 drop into both eyes 4 (four) times daily.   Pro-Stat Liqd Take 30 mLs by mouth daily.   propranolol 10 MG tablet Commonly known as: INDERAL Take 10 mg by mouth daily.   sulfamethoxazole-trimethoprim 800-160 MG tablet Commonly known as: BACTRIM DS Take 1 tablet by mouth daily.   torsemide 20 MG tablet Commonly known as: DEMADEX Take 10 mg by mouth daily.   Triamcinolone Acetonide 0.025 % Lotn Apply topically.   Umbralisib Tosylate 200 MG Tabs Take 400 mg by mouth daily.   zinc oxide 20 % ointment Apply 1 application topically as needed for irritation. To buttocks after every incontinent episode and as needed for redness. May keep at bedside.       Allergies:  Allergies  Allergen Reactions  . Penicillins Hives  . Carbamazepine Other (See Comments) and Hives    Turned purple from neck down and drowsy  Other reaction(s): Other (See Comments) Turned purple from neck down and drowsy  . Pregabalin Other (See Comments)    Drowsy Other reaction(s): Confusion (intolerance) Drowsy    Past Medical History, Surgical history, Social history, and Family History were reviewed and updated.  Review of Systems: All other 10 point review of systems is negative.   Physical Exam:  vitals were not taken for this visit.   Wt Readings from Last 3 Encounters:  11/30/20 120 lb (54.4 kg)  11/15/20 120 lb (54.4 kg)  11/10/20 120 lb (54.4 kg)    Ocular: Sclerae unicteric, pupils equal, round and reactive to  light Ear-nose-throat: Oropharynx clear, dentition fair Lymphatic: No cervical, supraclavicular or axillary adenopathy Lungs no rales or rhonchi, good excursion bilaterally Heart regular rate and rhythm, no murmur appreciated Abd soft, nontender, positive bowel sounds MSK no focal spinal tenderness, no joint edema Neuro: non-focal, well-oriented, appropriate affect Breasts: Deferred   Lab Results  Component Value Date   WBC 92.6 (HH) 12/13/2020   HGB 10.6 (L) 12/13/2020   HCT 33.8 (L) 12/13/2020   MCV 108.0 (H) 12/13/2020   PLT 234 12/13/2020   No results found for: FERRITIN, IRON, TIBC, UIBC, IRONPCTSAT Lab Results  Component Value Date   RBC 3.13 (L) 12/13/2020   Lab Results  Component Value Date   KPAFRELGTCHN 10.0 10/14/2020   LAMBDASER 33.3 (H) 10/14/2020   KAPLAMBRATIO 0.30 10/14/2020   Lab Results  Component Value Date   IGGSERUM 161 (L) 10/14/2020   IGA 329 10/14/2020   IGMSERUM 3,313 (H) 10/14/2020   No results found for: Kathrynn Ducking, MSPIKE, SPEI   Chemistry      Component Value Date/Time   NA 137 11/10/2020 0000   NA 138 08/16/2017 1256   K 4.1 11/10/2020 0000   K 4.3 08/16/2017 1256   CL 104 11/10/2020 0000   CO2 25 (A) 11/10/2020 0000   CO2 25 08/16/2017 1256   BUN 16 11/10/2020 0000   BUN 19.6 08/16/2017 1256   CREATININE 1.3 (A) 11/10/2020 0000   CREATININE 1.21 (H) 10/14/2020 1042   CREATININE 0.7 08/16/2017 1256   GLU 78 11/10/2020 0000      Component Value Date/Time   CALCIUM 8.0 (A) 11/10/2020 0000   CALCIUM 9.2 08/16/2017 1256   ALKPHOS 51 11/04/2020 0000   ALKPHOS 42 08/16/2017 1256   AST 19 11/04/2020 0000   AST 20 10/14/2020 1042   AST 18 08/16/2017 1256   ALT 9 11/04/2020 0000   ALT 8 10/14/2020 1042   ALT 11 08/16/2017 1256   BILITOT 0.4 10/14/2020 1042   BILITOT 0.87 08/16/2017 1256       Impression and Plan: Joy Lynch is a very pleasant 87 yo caucasian female with relapsed  marginal zone lymphoma.  Her symptoms have improved drastically on Umbralisib despite the climb in her WBC count and Creatinine.  Immunoglobulins and light chain studies are pending.  I spoke with dr. Marin Olp regarding her lab work vs. Improvement and symptoms and for now we will continue to watch. She started the medication 8 weeks ago.  No change to her dosage at this time.  Follow-up in 6 weeks to re-evaluate.  They are in agreement with the plan and were encouraged to contact our office with any questions or concerns.   Laverna Peace, NP 3/15/202210:43 AM

## 2020-12-13 NOTE — Telephone Encounter (Signed)
appts made per 12/13/20 los, calendar printed for pt and nephew placed in phone   Joy Lynch

## 2020-12-13 NOTE — ED Notes (Signed)
[facility] contacted for transport 

## 2020-12-13 NOTE — Telephone Encounter (Signed)
Dr. Marin Olp notified of WBC-92.6. No new orders received at this time.

## 2020-12-14 DIAGNOSIS — Z9181 History of falling: Secondary | ICD-10-CM | POA: Diagnosis not present

## 2020-12-14 DIAGNOSIS — M6281 Muscle weakness (generalized): Secondary | ICD-10-CM | POA: Diagnosis not present

## 2020-12-14 DIAGNOSIS — R293 Abnormal posture: Secondary | ICD-10-CM | POA: Diagnosis not present

## 2020-12-14 DIAGNOSIS — R2681 Unsteadiness on feet: Secondary | ICD-10-CM | POA: Diagnosis not present

## 2020-12-14 LAB — IGG, IGA, IGM
IgA: 361 mg/dL (ref 64–422)
IgG (Immunoglobin G), Serum: 151 mg/dL — ABNORMAL LOW (ref 586–1602)
IgM (Immunoglobulin M), Srm: 2952 mg/dL — ABNORMAL HIGH (ref 26–217)

## 2020-12-14 LAB — KAPPA/LAMBDA LIGHT CHAINS
Kappa free light chain: 13 mg/L (ref 3.3–19.4)
Kappa, lambda light chain ratio: 0.6 (ref 0.26–1.65)
Lambda free light chains: 21.5 mg/L (ref 5.7–26.3)

## 2020-12-14 LAB — BETA 2 MICROGLOBULIN, SERUM: Beta-2 Microglobulin: 4.8 mg/L — ABNORMAL HIGH (ref 0.6–2.4)

## 2020-12-15 ENCOUNTER — Encounter: Payer: Self-pay | Admitting: Internal Medicine

## 2020-12-15 ENCOUNTER — Non-Acute Institutional Stay: Payer: Medicare Other | Admitting: Internal Medicine

## 2020-12-15 DIAGNOSIS — G43001 Migraine without aura, not intractable, with status migrainosus: Secondary | ICD-10-CM

## 2020-12-15 DIAGNOSIS — I959 Hypotension, unspecified: Secondary | ICD-10-CM | POA: Diagnosis not present

## 2020-12-15 DIAGNOSIS — C8588 Other specified types of non-Hodgkin lymphoma, lymph nodes of multiple sites: Secondary | ICD-10-CM | POA: Diagnosis not present

## 2020-12-15 DIAGNOSIS — Z9181 History of falling: Secondary | ICD-10-CM | POA: Diagnosis not present

## 2020-12-15 DIAGNOSIS — R296 Repeated falls: Secondary | ICD-10-CM

## 2020-12-15 DIAGNOSIS — R293 Abnormal posture: Secondary | ICD-10-CM | POA: Diagnosis not present

## 2020-12-15 DIAGNOSIS — R2681 Unsteadiness on feet: Secondary | ICD-10-CM | POA: Diagnosis not present

## 2020-12-15 DIAGNOSIS — R600 Localized edema: Secondary | ICD-10-CM | POA: Diagnosis not present

## 2020-12-15 DIAGNOSIS — M6281 Muscle weakness (generalized): Secondary | ICD-10-CM | POA: Diagnosis not present

## 2020-12-15 NOTE — Progress Notes (Signed)
Location: [place] Room Number: [id number] Place of Service:  ALF [phone])  Provider: Veleta Miners MD  Code Status: Full Code Managed Care Goals of Care:  Advanced Directives 12/13/2020  Does Patient Have a Medical Advance Directive? Yes  Type of Paramedic of [place];Living will  Does patient want to make changes to medical advance directive? -  Copy of [place] in Chart? -  Would patient like information on creating a medical advance directive? -     Chief Complaint  Patient presents with  . Acute Visit    Fall    HPI: Patient is a 87 y.o. female seen today for an acute visit for Recurent Fall  Patient has a history ofRelapsed Marginal Zonelymphoma followed by oncology now on Oral therapy osteoporosis,  hypothyroidism, trigeminal neuralgia and migraines  s/pfrontoparietal subarachnoid hemorrhageand Rib fractures  Patient has a history of recurrent falls. She loses her balance and falls.  She underwent therapy in SNF And is now in [place].  Had fall few days ago had to go send her to ED.  She sustained scalp  Hematoma. CT scan of head and neck was negative for any acute injuries. Patient states that she does loses her footing and falls.  Denies any dizziness. Select occasions patient's blood pressure has been low.  SBP  sometimes less than 100. Past Medical History:  Diagnosis Date  . Cancer [facility])    non hodgkin lymphoma  . Chronic lymphocytic leukemia ([place])   . History of B-cell lymphoma 11/02/2015  . Migraines   . Osteoporosis   . Thyroid disease   . Trigeminal neuralgia of left side of face     Past Surgical History:  Procedure Laterality Date  . gamma knife     for trigeminal neuralgia  . SHOULDER SURGERY     Right shoulder replacement  . TONSILLECTOMY AND ADENOIDECTOMY    . WISDOM TOOTH EXTRACTION      Allergies  Allergen Reactions  . Penicillins Hives  . Carbamazepine Other (See  Comments) and Hives    Turned purple from neck down and drowsy Other reaction(s): Other (See Comments) Turned purple from neck down and drowsy  . Pregabalin Other (See Comments)    Drowsy Other reaction(s): Confusion (intolerance) Drowsy    Outpatient Encounter Medications as of 12/15/2020  Medication Sig  . acetaminophen (TYLENOL) 500 MG tablet Take 500 mg by mouth every 6 (six) hours.  . Amino Acids-Protein Hydrolys (PRO-STAT) LIQD Take 30 mLs by mouth daily.  Marland Kitchen aspirin 81 MG chewable tablet Chew 81 mg by mouth daily.  . famciclovir (FAMVIR) 250 MG tablet Take 1 tablet (250 mg total) by mouth daily.  Marland Kitchen gabapentin (NEURONTIN) 800 MG tablet Take 800 mg by mouth 3 (three) times daily.  Marland Kitchen lactose free nutrition (BOOST) LIQD Take 237 mLs by mouth daily.  Marland Kitchen levothyroxine (SYNTHROID) 100 MCG tablet Take 100 mcg by mouth daily before breakfast.  . Multiple Vitamin (MULTI-VITAMINS) TABS Take 1 tablet by mouth daily.  . polyvinyl alcohol (LIQUIFILM TEARS) 1.4 % ophthalmic solution Place 1 drop into both eyes 4 (four) times daily.  . propranolol (INDERAL) 10 MG tablet Take 10 mg by mouth daily.  Marland Kitchen sulfamethoxazole-trimethoprim (BACTRIM DS) 800-160 MG tablet Take 1 tablet by mouth daily.  Marland Kitchen torsemide (DEMADEX) 20 MG tablet Take 10 mg by mouth daily.  . Triamcinolone Acetonide 0.025 % LOTN Apply topically.  Marland Kitchen Umbralisib Tosylate 200 MG TABS Take 400 mg by  mouth daily.  . white petrolatum ointment Apply topically 2 (two) times daily.  Marland Kitchen zinc oxide 20 % ointment Apply 1 application topically as needed for irritation. To buttocks after every incontinent episode and as needed for redness. May keep at bedside.   No facility-administered encounter medications on file as of 12/15/2020.    Review of Systems:  Review of Systems  Review of Systems  Constitutional: Negative for activity change, appetite change, chills, diaphoresis, fatigue and fever.  HENT: Negative for mouth sores, postnasal drip,  rhinorrhea, sinus pain and sore throat.   Respiratory: Negative for apnea, cough, chest tightness, shortness of breath and wheezing.   Cardiovascular: Negative for chest pain, palpitations and leg swelling.  Gastrointestinal: Negative for abdominal distention, abdominal pain, constipation, diarrhea, nausea and vomiting.  Genitourinary: Negative for dysuria and frequency.  Musculoskeletal: Negative for arthralgias, joint swelling and myalgias.  Skin: Negative for rash.  Neurological: Negative for dizziness, syncope, weakness, light-headedness and numbness.  Psychiatric/Behavioral: Negative for behavioral problems, confusion and sleep disturbance.     Health Maintenance  Topic Date Due  . COVID-19 Vaccine (4 - Booster for Moderna series) 02/12/2021  . TETANUS/TDAP  12/14/2030  . INFLUENZA VACCINE  Completed  . DEXA SCAN  Completed  . PNA vac Low Risk Adult  Completed  . HPV VACCINES  Aged Out    Physical Exam: Vitals:   12/15/20 1617  BP: 108/72  Pulse: 62  Resp: 20  Temp: (!) 97.5 F (36.4 C)  SpO2: 95%  Weight: 123 lb 3.2 oz (55.9 kg)  Height: 5\' 6"  (1.676 m)   Body mass index is 19.89 kg/m. Physical Exam  Constitutional: Oriented to person, place, and time. Well-developed and well-nourished.  HENT:  Head: Normocephalic.  Mouth/Throat: Oropharynx is clear and moist.  Eyes: Pupils are equal, round, and reactive to light.  Neck: Neck supple.  Cardiovascular: Normal rate and normal heart sounds.   Pulmonary/Chest: Effort normal and breath sounds normal. No respiratory distress. No wheezes. She has no rales.  Abdominal: Soft. Bowel sounds are normal. No distension. There is no tenderness. There is no rebound.  Musculoskeletal: Trace edema.  Lymphadenopathy: none Neurological: Alert and oriented to person, place, and time. No Focal Deficits Walks with her walker Skin: Skin is warm and dry.  Psychiatric: Normal mood and affect. Behavior is normal. Thought content normal.     Labs reviewed: Basic Metabolic Panel: Recent Labs    08/27/20 0112 08/27/20 0112 09/08/20 0000 10/14/20 1042 11/04/20 0000 11/10/20 0000 12/13/20 1020  NA 137   < > 139 144 138 137 138  K 3.5  --  4.2 4.2 3.8 4.1 4.6  CL 102  --  105 105 105 104 100  CO2 26  --  26* 31 25* 25* 29  GLUCOSE 99  --   --  131*  --   --  101*  BUN 16   < > 21 31* 20 16 41*  CREATININE 0.88   < > 1.0 1.21* 1.2* 1.3* 1.60*  CALCIUM 8.2*  --  8.0* 9.3 7.9* 8.0* 9.7  TSH  --   --  27.99*  --   --   --   --    < > = values in this interval not displayed.   Liver Function Tests: Recent Labs    08/24/20 2006 09/08/20 0000 10/14/20 1042 11/04/20 0000 12/13/20 1020  AST 31   < > 20 19 20   ALT 12   < > 8 9 8  ALKPHOS 62   < > 72 51 49  BILITOT 0.5  --  0.4  --  0.2*  PROT 7.1  --  6.6  --  6.7  ALBUMIN 1.9*   < > 2.3* 1.8* 2.6*   < > = values in this interval not displayed.   No results for input(s): LIPASE, AMYLASE in the last 8760 hours. No results for input(s): AMMONIA in the last 8760 hours. CBC: Recent Labs    08/27/20 0112 09/08/20 0000 10/14/20 1042 11/04/20 0000 12/13/20 1020  WBC 43.8*   < > 55.9* 74.0 92.6*  NEUTROABS 2.7   < > 3.9 2,886.00 5.0  HGB 9.9*   < > 9.4* 8.6* 10.6*  HCT 29.8*   < > 30.2* 27* 33.8*  MCV 103.5*  --  109.4*  --  108.0*  PLT 138*   < > 213 201 234   < > = values in this interval not displayed.   Lipid Panel: No results for input(s): CHOL, HDL, LDLCALC, TRIG, CHOLHDL, LDLDIRECT in the last 8760 hours. No results found for: HGBA1C  Procedures since last visit: CT Head Wo Contrast  Result Date: 12/13/2020 CLINICAL DATA:  Fall EXAM: CT HEAD WITHOUT CONTRAST TECHNIQUE: Contiguous axial images were obtained from the base of the skull through the vertex without intravenous contrast. COMPARISON:  08/25/2020 FINDINGS: Brain: There is no acute intracranial hemorrhage, mass effect, or edema. Gray-white differentiation is preserved. There is no extra-axial  fluid collection. Patchy hypoattenuation in the supratentorial white matter is nonspecific but probably reflects stable chronic microvascular ischemic changes. Prominence of the ventricles and sulci reflects stable parenchymal volume loss. Vascular: There is atherosclerotic calcification at the skull base. Skull: Calvarium is unremarkable. Sinuses/Orbits: No acute finding. Other: Right posterior scalp hematoma. IMPRESSION: No evidence of acute intracranial injury. Stable chronic/nonemergent findings detailed above. Electronically Signed   By: Macy Mis M.D.   On: 12/13/2020 16:19   CT Cervical Spine Wo Contrast  Result Date: 12/13/2020 CLINICAL DATA:  Fall EXAM: CT CERVICAL SPINE WITHOUT CONTRAST TECHNIQUE: Multidetector CT imaging of the cervical spine was performed without intravenous contrast. Multiplanar CT image reconstructions were also generated. COMPARISON:  08/24/2020 FINDINGS: Alignment: Stable. Skull base and vertebrae: Stable vertebral body heights. No acute fracture. Soft tissues and spinal canal: No prevertebral fluid or swelling. No visible canal hematoma. Disc levels: Multilevel degenerative changes are present including disc space narrowing, endplate osteophytes, and facet and uncovertebral hypertrophy. Appearance is similar to prior study. Upper chest: Scarring at the lung apices. Other: Calcified plaque at the common carotid bifurcations. IMPRESSION: No acute cervical spine fracture. Electronically Signed   By: Macy Mis M.D.   On: 12/13/2020 16:15    Assessment/Plan Recurrent falls Discussed with the patient At this time with some of her blood pressures being well we will discontinue the propanolol.  Also make her Demadex as needed. Emphasized again to use her walker all the time  Hypotension,  Discontinue Propranolol Migraine without aura and with status migrainosus, not intractable Discontinue propanolol for now Lymphoma, marginal zone, lymph nodes of multiple sites  [facility]) with Anemia Tolerating the treatment and clinically doing better  Bilateral lower extremity edema Change Demadex to PRN Trigeminal neuralgia On High dose of neurontin Hypothyroidism, unspecified type Last TSH in 1/22 was 8.3 per her PCP Dose was changed recently Will follow with repeat TSH in 8 week  Labs/tests ordered:  * No order type specified * Next appt:  Visit date not found

## 2020-12-19 DIAGNOSIS — M6281 Muscle weakness (generalized): Secondary | ICD-10-CM | POA: Diagnosis not present

## 2020-12-19 DIAGNOSIS — Z9181 History of falling: Secondary | ICD-10-CM | POA: Diagnosis not present

## 2020-12-19 DIAGNOSIS — R2681 Unsteadiness on feet: Secondary | ICD-10-CM | POA: Diagnosis not present

## 2020-12-19 DIAGNOSIS — R293 Abnormal posture: Secondary | ICD-10-CM | POA: Diagnosis not present

## 2020-12-21 DIAGNOSIS — R2681 Unsteadiness on feet: Secondary | ICD-10-CM | POA: Diagnosis not present

## 2020-12-21 DIAGNOSIS — R293 Abnormal posture: Secondary | ICD-10-CM | POA: Diagnosis not present

## 2020-12-21 DIAGNOSIS — M6281 Muscle weakness (generalized): Secondary | ICD-10-CM | POA: Diagnosis not present

## 2020-12-21 DIAGNOSIS — Z9181 History of falling: Secondary | ICD-10-CM | POA: Diagnosis not present

## 2020-12-21 MED FILL — UKONIQ 200 MG TABS: 200 | 30 days supply | Qty: 60 | Fill #2

## 2020-12-22 DIAGNOSIS — M6281 Muscle weakness (generalized): Secondary | ICD-10-CM | POA: Diagnosis not present

## 2020-12-22 DIAGNOSIS — R2681 Unsteadiness on feet: Secondary | ICD-10-CM | POA: Diagnosis not present

## 2020-12-22 DIAGNOSIS — R293 Abnormal posture: Secondary | ICD-10-CM | POA: Diagnosis not present

## 2020-12-22 DIAGNOSIS — Z9181 History of falling: Secondary | ICD-10-CM | POA: Diagnosis not present

## 2020-12-23 DIAGNOSIS — R293 Abnormal posture: Secondary | ICD-10-CM | POA: Diagnosis not present

## 2020-12-23 DIAGNOSIS — Z9181 History of falling: Secondary | ICD-10-CM | POA: Diagnosis not present

## 2020-12-23 DIAGNOSIS — M6281 Muscle weakness (generalized): Secondary | ICD-10-CM | POA: Diagnosis not present

## 2020-12-23 DIAGNOSIS — R2681 Unsteadiness on feet: Secondary | ICD-10-CM | POA: Diagnosis not present

## 2020-12-26 ENCOUNTER — Encounter: Payer: Self-pay | Admitting: Nurse Practitioner

## 2020-12-26 ENCOUNTER — Non-Acute Institutional Stay: Payer: Medicare Other | Admitting: Nurse Practitioner

## 2020-12-26 DIAGNOSIS — I10 Essential (primary) hypertension: Secondary | ICD-10-CM

## 2020-12-26 DIAGNOSIS — E039 Hypothyroidism, unspecified: Secondary | ICD-10-CM | POA: Diagnosis not present

## 2020-12-26 DIAGNOSIS — N1831 Chronic kidney disease, stage 3a: Secondary | ICD-10-CM

## 2020-12-26 DIAGNOSIS — M79604 Pain in right leg: Secondary | ICD-10-CM | POA: Diagnosis not present

## 2020-12-26 DIAGNOSIS — R293 Abnormal posture: Secondary | ICD-10-CM | POA: Diagnosis not present

## 2020-12-26 DIAGNOSIS — G500 Trigeminal neuralgia: Secondary | ICD-10-CM

## 2020-12-26 DIAGNOSIS — R269 Unspecified abnormalities of gait and mobility: Secondary | ICD-10-CM | POA: Diagnosis not present

## 2020-12-26 DIAGNOSIS — R600 Localized edema: Secondary | ICD-10-CM

## 2020-12-26 DIAGNOSIS — M79651 Pain in right thigh: Secondary | ICD-10-CM | POA: Diagnosis not present

## 2020-12-26 DIAGNOSIS — C8588 Other specified types of non-Hodgkin lymphoma, lymph nodes of multiple sites: Secondary | ICD-10-CM

## 2020-12-26 DIAGNOSIS — M25551 Pain in right hip: Secondary | ICD-10-CM | POA: Diagnosis not present

## 2020-12-26 DIAGNOSIS — W19XXXS Unspecified fall, sequela: Secondary | ICD-10-CM | POA: Diagnosis not present

## 2020-12-26 DIAGNOSIS — M545 Low back pain, unspecified: Secondary | ICD-10-CM | POA: Diagnosis not present

## 2020-12-26 DIAGNOSIS — R2681 Unsteadiness on feet: Secondary | ICD-10-CM | POA: Diagnosis not present

## 2020-12-26 DIAGNOSIS — I639 Cerebral infarction, unspecified: Secondary | ICD-10-CM | POA: Diagnosis not present

## 2020-12-26 DIAGNOSIS — D630 Anemia in neoplastic disease: Secondary | ICD-10-CM

## 2020-12-26 DIAGNOSIS — E871 Hypo-osmolality and hyponatremia: Secondary | ICD-10-CM

## 2020-12-26 DIAGNOSIS — M6281 Muscle weakness (generalized): Secondary | ICD-10-CM | POA: Diagnosis not present

## 2020-12-26 DIAGNOSIS — Z9181 History of falling: Secondary | ICD-10-CM | POA: Diagnosis not present

## 2020-12-26 DIAGNOSIS — M810 Age-related osteoporosis without current pathological fracture: Secondary | ICD-10-CM | POA: Diagnosis not present

## 2020-12-26 NOTE — Progress Notes (Addendum)
Location:    [facility] Room Number: 35 Place of Service:  ALF [phone]) Provider: Marda Stalker, Lennie Odor NP   Virgie Dad, MD  Patient Care Team: Virgie Dad, MD as PCP - General (Internal Medicine)  Extended Emergency Contact Information Primary Emergency Contact: Genia Plants Address: [address]          [place], [place] [address] Johnnette Litter of [place] Phone: [phone] Relation: Nephew Secondary Emergency Contact: [name] Address: [address]          [place], [place] [address] Johnnette Litter of [place] Phone: [phone] Mobile Phone: [phone] Relation: Nephew  Code Status:  Full Code Managed Care Goals of care: Advanced Directive information Advanced Directives 12/13/2020  Does Patient Have a Medical Advance Directive? Yes  Type of Paramedic of [place];Living will  Does patient want to make changes to medical advance directive? -  Copy of [place] in Chart? -  Would patient like information on creating a medical advance directive? -     Chief Complaint  Patient presents with  . Acute Visit    R leg pain    HPI:  Pt is a 87 y.o. female seen today for an acute visit for c/o R leg pain for a week, had a fall about 10 days ago.  Pain is from her right hip to the right ankle, but no pain in the right knee. Pain bothers her at night, it seems better during day. She said she felt it when walking with walker. Chronic lower back pain has no change.   Edema BLE, prn  Torsemide  Bun/creat 41/1.6 12/13/20             Marginal zone lymphoma, f/u oncology, wbc 92 12/13/20 Hyponatremia  Na 138 12/13/20 Hypothyroidism, takes Levothyroxine, f/u TSH,  TSH 27.99 09/08/20 Chronic left cerebellar infarct, hx of, no apparent focal residual.  Gait abnormality, therapy. Walker.              Frequent falls             CKD, Bun/creat 41/1.6 12/13/20              Anemia, Hgb 10.6 12/13/20 OP DEXA 02/25/20 tscore -3.8 Migraines, off Propranolol.  HTN recommended MAP range 70-90, off Propranolol Trigeminal neuralgia, takes  Gabapentin.  Bartholin gland cysts on each labiaper report.     Past Medical History:  Diagnosis Date  . Cancer [facility])    non hodgkin lymphoma  . Chronic lymphocytic leukemia ([place])   . History of B-cell lymphoma 11/02/2015  . Migraines   . Osteoporosis   . Thyroid disease   . Trigeminal neuralgia of left side of face    Past Surgical History:  Procedure Laterality Date  . gamma knife     for trigeminal neuralgia  . SHOULDER SURGERY     Right shoulder replacement  . TONSILLECTOMY AND ADENOIDECTOMY    . WISDOM TOOTH EXTRACTION      Allergies  Allergen Reactions  . Penicillins Hives  . Carbamazepine Other (See Comments) and Hives    Turned purple from neck down and drowsy Other reaction(s): Other (See Comments) Turned purple from neck down and drowsy  . Pregabalin Other (See Comments)    Drowsy Other reaction(s): Confusion (intolerance) Drowsy    Allergies as of 12/26/2020      Reactions   Penicillins Hives   Carbamazepine Other (See Comments), Hives   Turned purple from  neck down and drowsy Other reaction(s): Other (See Comments) Turned purple from neck down and drowsy   Pregabalin Other (See Comments)   Drowsy Other reaction(s): Confusion (intolerance) Drowsy      Medication List       Accurate as of December 26, 2020 11:59 PM. If you have any questions, ask your nurse or doctor.        STOP taking these medications   propranolol 10 MG tablet Commonly known as: INDERAL Stopped by: Geneveive Furness X Baker Kogler, NP     TAKE these medications   acetaminophen 500 MG tablet Commonly known as: TYLENOL Take 500 mg by mouth every 6 (six) hours.   acetaminophen 500 MG tablet Commonly known as: TYLENOL Take 500 mg by mouth 3 (three) times daily.   aspirin  81 MG chewable tablet Chew 81 mg by mouth daily.   famciclovir 250 MG tablet Commonly known as: FAMVIR Take 1 tablet (250 mg total) by mouth daily.   famciclovir 125 MG tablet Commonly known as: FAMVIR TAKE 2 TABLETS (250MG ) BY MOUTH DAILY   gabapentin 800 MG tablet Commonly known as: NEURONTIN Take 800 mg by mouth 3 (three) times daily.   lactose free nutrition Liqd Take 237 mLs by mouth daily.   levothyroxine 100 MCG tablet Commonly known as: SYNTHROID Take 100 mcg by mouth daily before breakfast.   Multi-Vitamins Tabs Take 1 tablet by mouth daily.   polyvinyl alcohol 1.4 % ophthalmic solution Commonly known as: LIQUIFILM TEARS Place 1 drop into both eyes 4 (four) times daily.   Pro-Stat Liqd Take 30 mLs by mouth daily.   sulfamethoxazole-trimethoprim 800-160 MG tablet Commonly known as: BACTRIM DS TAKE 1 TABLET BY MOUTH DAILY.   torsemide 20 MG tablet Commonly known as: DEMADEX Take 10 mg by mouth daily.   Triamcinolone Acetonide 0.025 % Lotn Apply topically.   Ukoniq 200 MG Tabs Generic drug: Umbralisib Tosylate TAKE 2 TABLETS (400 MG) BY MOUTH DAILY.   white petrolatum ointment Apply topically 2 (two) times daily.   zinc oxide 20 % ointment Apply 1 application topically as needed for irritation. To buttocks after every incontinent episode and as needed for redness. May keep at bedside.       Review of Systems  Constitutional: Negative for fatigue, fever and unexpected weight change.  HENT: Positive for hearing loss. Negative for congestion, sinus pain and sore throat.   Eyes: Negative for visual disturbance.  Respiratory: Negative for cough, chest tightness and shortness of breath.   Cardiovascular: Positive for leg swelling. Negative for chest pain and palpitations.  Gastrointestinal: Negative for abdominal pain and constipation.  Genitourinary: Negative for difficulty urinating, dysuria, frequency and urgency.       Bartholin cysts.    Musculoskeletal: Positive for arthralgias, back pain and gait problem.       Ambulates with walker. C/o R hip to R ankle pain, more at night, less when she is up/walking but she felt it.   Skin: Negative for color change.  Neurological: Negative for speech difficulty, weakness, light-headedness and headaches.  Psychiatric/Behavioral: Negative for behavioral problems, confusion and sleep disturbance. The patient is not nervous/anxious.     Immunization History  Administered Date(s) Administered  . Fluad Quad(high Dose 65+) 05/28/2019  . Influenza Split 07/12/2016, 08/13/2017, 06/02/2019, 09/09/2020  . Influenza, High Dose Seasonal PF 06/11/2017, 06/28/2018  . Influenza-Unspecified 06/21/2020  . Moderna Sars-Covid-2 Vaccination 10/05/2019, 11/02/2019, 08/15/2020  . Pneumococcal Conjugate-13 11/22/2014  . Pneumococcal Polysaccharide-23 04/29/2009, 08/13/2017  . Td 01/24/2013  .  Tdap 12/28/2017, 07/01/2018, 12/13/2020  . Zoster 11/12/2009, 07/01/2018  . Zoster Recombinat (Shingrix) 07/01/2018   Pertinent  Health Maintenance Due  Topic Date Due  . INFLUENZA VACCINE  05/01/2021  . DEXA SCAN  Completed  . PNA vac Low Risk Adult  Completed   No flowsheet data found. Functional Status Survey:    Vitals:   12/26/20 1414  BP: 106/62  Pulse: (!) 103  Resp: 20  Temp: (!) 97.5 F (36.4 C)  SpO2: 96%  Weight: 123 lb 3.2 oz (55.9 kg)  Height: 5\' 6"  (1.676 m)   Body mass index is 19.89 kg/m. Physical Exam Vitals and nursing note reviewed.  Constitutional:      Appearance: Normal appearance.  HENT:     Nose: Nose normal.     Mouth/Throat:     Mouth: Mucous membranes are moist.  Eyes:     Extraocular Movements: Extraocular movements intact.     Conjunctiva/sclera: Conjunctivae normal.     Pupils: Pupils are equal, round, and reactive to light.  Cardiovascular:     Rate and Rhythm: Normal rate and regular rhythm.     Heart sounds: Murmur heard.    Pulmonary:     Effort:  Pulmonary effort is normal.     Breath sounds: Normal breath sounds. No wheezing, rhonchi or rales.  Abdominal:     General: Bowel sounds are normal.     Palpations: Abdomen is soft.     Tenderness: There is no abdominal tenderness.  Musculoskeletal:     Cervical back: Normal range of motion and neck supple.     Right lower leg: Edema present.     Left lower leg: Edema present.     Comments: Trace edema BLE. C/o R hip to R ankle pain, more at night, less when she is up/walking but she felt it. No spinal spinous process tenderness, right hip tenderness, right knee tenderness, or right ankle tenderness palpated.  Able to stand and walking.   Skin:    General: Skin is warm and dry.  Neurological:     General: No focal deficit present.     Mental Status: She is alert and oriented to person, place, and time. Mental status is at baseline.     Gait: Gait abnormal.  Psychiatric:        Mood and Affect: Mood normal.        Behavior: Behavior normal.        Thought Content: Thought content normal.        Judgment: Judgment normal.     Labs reviewed: Recent Labs    08/27/20 0112 09/08/20 0000 10/14/20 1042 11/04/20 0000 11/10/20 0000 12/13/20 1020  NA 137   < > 144 138 137 138  K 3.5   < > 4.2 3.8 4.1 4.6  CL 102   < > 105 105 104 100  CO2 26   < > 31 25* 25* 29  GLUCOSE 99  --  131*  --   --  101*  BUN 16   < > 31* 20 16 41*  CREATININE 0.88   < > 1.21* 1.2* 1.3* 1.60*  CALCIUM 8.2*   < > 9.3 7.9* 8.0* 9.7   < > = values in this interval not displayed.   Recent Labs    08/24/20 2006 09/08/20 0000 10/14/20 1042 11/04/20 0000 12/13/20 1020  AST 31   < > 20 19 20   ALT 12   < > 8 9 8   ALKPHOS 62   < >  72 51 49  BILITOT 0.5  --  0.4  --  0.2*  PROT 7.1  --  6.6  --  6.7  ALBUMIN 1.9*   < > 2.3* 1.8* 2.6*   < > = values in this interval not displayed.   Recent Labs    08/27/20 0112 09/08/20 0000 10/14/20 1042 11/04/20 0000 12/13/20 1020  WBC 43.8*   < > 55.9* 74.0  92.6*  NEUTROABS 2.7   < > 3.9 2,886.00 5.0  HGB 9.9*   < > 9.4* 8.6* 10.6*  HCT 29.8*   < > 30.2* 27* 33.8*  MCV 103.5*  --  109.4*  --  108.0*  PLT 138*   < > 213 201 234   < > = values in this interval not displayed.   Lab Results  Component Value Date   TSH 27.99 (A) 09/08/2020   No results found for: HGBA1C No results found for: CHOL, HDL, LDLCALC, LDLDIRECT, TRIG, CHOLHDL  Significant Diagnostic Results in last 30 days:  CT Head Wo Contrast  Result Date: 12/13/2020 CLINICAL DATA:  Fall EXAM: CT HEAD WITHOUT CONTRAST TECHNIQUE: Contiguous axial images were obtained from the base of the skull through the vertex without intravenous contrast. COMPARISON:  08/25/2020 FINDINGS: Brain: There is no acute intracranial hemorrhage, mass effect, or edema. Gray-white differentiation is preserved. There is no extra-axial fluid collection. Patchy hypoattenuation in the supratentorial white matter is nonspecific but probably reflects stable chronic microvascular ischemic changes. Prominence of the ventricles and sulci reflects stable parenchymal volume loss. Vascular: There is atherosclerotic calcification at the skull base. Skull: Calvarium is unremarkable. Sinuses/Orbits: No acute finding. Other: Right posterior scalp hematoma. IMPRESSION: No evidence of acute intracranial injury. Stable chronic/nonemergent findings detailed above. Electronically Signed   By: Macy Mis M.D.   On: 12/13/2020 16:19   CT Cervical Spine Wo Contrast  Result Date: 12/13/2020 CLINICAL DATA:  Fall EXAM: CT CERVICAL SPINE WITHOUT CONTRAST TECHNIQUE: Multidetector CT imaging of the cervical spine was performed without intravenous contrast. Multiplanar CT image reconstructions were also generated. COMPARISON:  08/24/2020 FINDINGS: Alignment: Stable. Skull base and vertebrae: Stable vertebral body heights. No acute fracture. Soft tissues and spinal canal: No prevertebral fluid or swelling. No visible canal hematoma. Disc  levels: Multilevel degenerative changes are present including disc space narrowing, endplate osteophytes, and facet and uncovertebral hypertrophy. Appearance is similar to prior study. Upper chest: Scarring at the lung apices. Other: Calcified plaque at the common carotid bifurcations. IMPRESSION: No acute cervical spine fracture. Electronically Signed   By: Macy Mis M.D.   On: 12/13/2020 16:15    Assessment/Plan Right leg pain R leg pain for a week, had a fall about 10 days ago.  Pain is from her right hip to the right ankle, but no pain in the right knee. Pain bothers her at night, it seems better during day. She said she felt it when walking with walker. Chronic lower back pain has no change.  Obtain X-ray L spine, R hip, R Femur, pelvis. Schedule Tylenol 500mg  tid. Observe.  01/01/21 Tylenol is not adequate, will try Mobic 7.5mg  qd x 7 days, Omeprazole 20mg  qd x 7 days for GI protection.   HTN (hypertension) Blood pressure is controlled, has prn Torsemide for Bp, edema.   Bilateral leg edema Edema BLE, prn  Torsemide  Bun/creat 41/1.6 12/13/20   Lymphoma, marginal zone, lymph nodes of multiple sites ([place]) Marginal zone lymphoma, f/u oncology, wbc 92 12/13/20   Hyponatremia Na 138 12/13/20  Hypothyroidism  takes Levothyroxine, f/u TSH,  TSH 27.99 09/08/20   Cerebellar infarct ([place]) Chronic left cerebellar infarct, hx of, no apparent focal residual.    Gait abnormality Ambulates with walker.   Fall Unsteady gait is contributory. Needs safety awareness, assist the patient with mobility as needed.   CKD (chronic kidney disease) stage 3, GFR 30-59 ml/min (HCC) Bun/creat 41/1.6 12/13/20  Anemia in neoplastic disease Hgb 10.6 12/13/20  Osteoporosis OP DEXA 02/25/20 tscore -3.8   Trigeminal neuralgia Stable, takes Gabapentin, stable Migraines, off Propranolol.      Family/ staff Communication: plan of care reviewed with the patient and charge nurse.   Labs/tests  ordered: Xray  R hip, pelvis, R femur, lumbar spine.   Time spend 40 minutes.

## 2020-12-27 ENCOUNTER — Encounter: Payer: Self-pay | Admitting: Nurse Practitioner

## 2020-12-27 DIAGNOSIS — Z9181 History of falling: Secondary | ICD-10-CM | POA: Diagnosis not present

## 2020-12-27 DIAGNOSIS — R293 Abnormal posture: Secondary | ICD-10-CM | POA: Diagnosis not present

## 2020-12-27 DIAGNOSIS — M79604 Pain in right leg: Secondary | ICD-10-CM | POA: Insufficient documentation

## 2020-12-27 DIAGNOSIS — M6281 Muscle weakness (generalized): Secondary | ICD-10-CM | POA: Diagnosis not present

## 2020-12-27 DIAGNOSIS — R2681 Unsteadiness on feet: Secondary | ICD-10-CM | POA: Diagnosis not present

## 2020-12-27 NOTE — Assessment & Plan Note (Signed)
Unsteady gait is contributory. Needs safety awareness, assist the patient with mobility as needed.

## 2020-12-27 NOTE — Assessment & Plan Note (Addendum)
R leg pain for a week, had a fall about 10 days ago.  Pain is from her right hip to the right ankle, but no pain in the right knee. Pain bothers her at night, it seems better during day. She said she felt it when walking with walker. Chronic lower back pain has no change.  Obtain X-ray L spine, R hip, R Femur, pelvis. Schedule Tylenol 500mg  tid. Observe.  01/01/21 Tylenol is not adequate, will try Mobic 7.5mg  qd x 7 days, Omeprazole 20mg  qd x 7 days for GI protection.

## 2020-12-27 NOTE — Assessment & Plan Note (Signed)
Chronic left cerebellar infarct, hx of, no apparent focal residual.  

## 2020-12-27 NOTE — Assessment & Plan Note (Signed)
Ambulates with walker.  

## 2020-12-27 NOTE — Assessment & Plan Note (Signed)
Na 138 12/13/20

## 2020-12-27 NOTE — Assessment & Plan Note (Signed)
OP DEXA 02/25/20 tscore -3.8

## 2020-12-27 NOTE — Assessment & Plan Note (Signed)
Edema BLE, prn  Torsemide  Bun/creat 41/1.6 12/13/20

## 2020-12-27 NOTE — Assessment & Plan Note (Signed)
Stable, takes Gabapentin, stable Migraines, off Propranolol.

## 2020-12-27 NOTE — Assessment & Plan Note (Signed)
Marginal zone lymphoma, f/u oncology, wbc 92 12/13/20

## 2020-12-27 NOTE — Assessment & Plan Note (Signed)
takes Levothyroxine, f/u TSH,  TSH 27.99 09/08/20

## 2020-12-27 NOTE — Assessment & Plan Note (Signed)
Blood pressure is controlled, has prn Torsemide for Bp, edema.

## 2020-12-27 NOTE — Assessment & Plan Note (Signed)
Bun/creat 41/1.6 12/13/20

## 2020-12-27 NOTE — Assessment & Plan Note (Signed)
Hgb 10.6 12/13/20

## 2020-12-28 DIAGNOSIS — R293 Abnormal posture: Secondary | ICD-10-CM | POA: Diagnosis not present

## 2020-12-28 DIAGNOSIS — R2681 Unsteadiness on feet: Secondary | ICD-10-CM | POA: Diagnosis not present

## 2020-12-28 DIAGNOSIS — Z9181 History of falling: Secondary | ICD-10-CM | POA: Diagnosis not present

## 2020-12-28 DIAGNOSIS — M6281 Muscle weakness (generalized): Secondary | ICD-10-CM | POA: Diagnosis not present

## 2020-12-29 ENCOUNTER — Other Ambulatory Visit (HOSPITAL_COMMUNITY): Payer: Self-pay

## 2020-12-29 DIAGNOSIS — Z9181 History of falling: Secondary | ICD-10-CM | POA: Diagnosis not present

## 2020-12-29 DIAGNOSIS — R293 Abnormal posture: Secondary | ICD-10-CM | POA: Diagnosis not present

## 2020-12-29 DIAGNOSIS — R2681 Unsteadiness on feet: Secondary | ICD-10-CM | POA: Diagnosis not present

## 2020-12-29 DIAGNOSIS — M6281 Muscle weakness (generalized): Secondary | ICD-10-CM | POA: Diagnosis not present

## 2021-01-02 ENCOUNTER — Encounter: Payer: Self-pay | Admitting: Nurse Practitioner

## 2021-01-02 NOTE — Progress Notes (Signed)
This encounter was created in error - please disregard.

## 2021-01-04 ENCOUNTER — Encounter: Payer: Self-pay | Admitting: Nurse Practitioner

## 2021-01-04 ENCOUNTER — Non-Acute Institutional Stay: Payer: Medicare Other | Admitting: Nurse Practitioner

## 2021-01-04 DIAGNOSIS — D630 Anemia in neoplastic disease: Secondary | ICD-10-CM

## 2021-01-04 DIAGNOSIS — E039 Hypothyroidism, unspecified: Secondary | ICD-10-CM

## 2021-01-04 DIAGNOSIS — I10 Essential (primary) hypertension: Secondary | ICD-10-CM

## 2021-01-04 DIAGNOSIS — M79604 Pain in right leg: Secondary | ICD-10-CM

## 2021-01-04 DIAGNOSIS — I639 Cerebral infarction, unspecified: Secondary | ICD-10-CM

## 2021-01-04 DIAGNOSIS — N1831 Chronic kidney disease, stage 3a: Secondary | ICD-10-CM

## 2021-01-04 DIAGNOSIS — W19XXXS Unspecified fall, sequela: Secondary | ICD-10-CM

## 2021-01-04 DIAGNOSIS — G43001 Migraine without aura, not intractable, with status migrainosus: Secondary | ICD-10-CM

## 2021-01-04 DIAGNOSIS — R600 Localized edema: Secondary | ICD-10-CM

## 2021-01-04 DIAGNOSIS — M810 Age-related osteoporosis without current pathological fracture: Secondary | ICD-10-CM

## 2021-01-04 DIAGNOSIS — N750 Cyst of Bartholin's gland: Secondary | ICD-10-CM

## 2021-01-04 DIAGNOSIS — R269 Unspecified abnormalities of gait and mobility: Secondary | ICD-10-CM

## 2021-01-04 DIAGNOSIS — C8588 Other specified types of non-Hodgkin lymphoma, lymph nodes of multiple sites: Secondary | ICD-10-CM | POA: Diagnosis not present

## 2021-01-04 DIAGNOSIS — E871 Hypo-osmolality and hyponatremia: Secondary | ICD-10-CM | POA: Diagnosis not present

## 2021-01-04 DIAGNOSIS — G500 Trigeminal neuralgia: Secondary | ICD-10-CM

## 2021-01-04 NOTE — Assessment & Plan Note (Signed)
recommended MAP range 70-90, off Propranolol 

## 2021-01-04 NOTE — Progress Notes (Signed)
Location:   [facility] Room Number: 35 Place of Service:  ALF (13) Provider: Lennie Odor Hennie Gosa NP  Virgie Dad, MD  Patient Care Team: Virgie Dad, MD as PCP - General (Internal Medicine)  Extended Emergency Contact Information Primary Emergency Contact: Genia Plants Address: [address]          [place], [place] [address] Johnnette Litter of [place] Phone: [phone] Relation: Nephew Secondary Emergency Contact: [name] Address: [address]          [place], [place] [address] Johnnette Litter of [place] Phone: [phone] Mobile Phone: [phone] Relation: Nephew  Code Status:  DNR Goals of care: Advanced Directive information Advanced Directives 12/13/2020  Does Patient Have a Medical Advance Directive? Yes  Type of Paramedic of [place];Living will  Does patient want to make changes to medical advance directive? -  Copy of [place] in Chart? -  Would patient like information on creating a medical advance directive? -     Chief Complaint  Patient presents with  . Acute Visit    C/o back pain, reported leg discoloration.     HPI:  Pt is a 87 y.o. female seen today for an acute visit for persisted right lower back SIJ and right hip pain, positional, able to bear weight and walk, negative fx per X-ray 12/26/20 lumbar, pelvis, hip, femur. Scheduled Tylenol and Mobic 01/01/21 is not adequate for pain control.   Edema BLE, prn  Torsemide  Bun/creat 41/1.6 12/13/20 Marginal zone lymphoma, f/u oncology, [id number] 12/13/20 HyponatremiaNa 138 12/13/20 Hypothyroidism, takes Levothyroxine, f/u TSH, TSH 27.99 09/08/20 Chronic left cerebellar infarct, hx of, no apparent focal residual. Gait abnormality, therapy. Walker.  Frequent falls CKD, Bun/creat 41/1.6 12/13/20 Anemia, Hgb 10.6 12/13/20 OP DEXA 02/25/20  tscore -3.8 Migraines, off Propranolol.  HTN recommended MAP range 70-90, off Propranolol Trigeminal neuralgia, takes  Gabapentin.  Bartholin gland cysts on each labiaper report.    Past Medical History:  Diagnosis Date  . Cancer [facility])    non hodgkin lymphoma  . Chronic lymphocytic leukemia ([place])   . History of B-cell lymphoma 11/02/2015  . Migraines   . Osteoporosis   . Thyroid disease   . Trigeminal neuralgia of left side of face    Past Surgical History:  Procedure Laterality Date  . gamma knife     for trigeminal neuralgia  . SHOULDER SURGERY     Right shoulder replacement  . TONSILLECTOMY AND ADENOIDECTOMY    . WISDOM TOOTH EXTRACTION      Allergies  Allergen Reactions  . Penicillins Hives  . Carbamazepine Other (See Comments) and Hives    Turned purple from neck down and drowsy Other reaction(s): Other (See Comments) Turned purple from neck down and drowsy  . Pregabalin Other (See Comments)    Drowsy Other reaction(s): Confusion (intolerance) Drowsy    Allergies as of 01/04/2021      Reactions   Penicillins Hives   Carbamazepine Other (See Comments), Hives   Turned purple from neck down and drowsy Other reaction(s): Other (See Comments) Turned purple from neck down and drowsy   Pregabalin Other (See Comments)   Drowsy Other reaction(s): Confusion (intolerance) Drowsy      Medication List       Accurate as of January 04, 2021 11:59 PM. If you have any questions, ask your nurse or doctor.        acetaminophen 500 MG tablet Commonly known as: TYLENOL Take 500  mg by mouth every 6 (six) hours.   acetaminophen 500 MG tablet Commonly known as: TYLENOL Take 500 mg by mouth 3 (three) times daily.   aspirin 81 MG chewable tablet Chew 81 mg by mouth daily.   famciclovir 250 MG tablet Commonly known as: FAMVIR Take 1 tablet (250 mg total) by mouth daily.   famciclovir 125 MG tablet Commonly known as:  FAMVIR TAKE 2 TABLETS (250MG ) BY MOUTH DAILY   gabapentin 800 MG tablet Commonly known as: NEURONTIN Take 800 mg by mouth 3 (three) times daily.   lactose free nutrition Liqd Take 237 mLs by mouth daily.   levothyroxine 100 MCG tablet Commonly known as: SYNTHROID Take 100 mcg by mouth daily before breakfast.   Multi-Vitamins Tabs Take 1 tablet by mouth daily.   polyvinyl alcohol 1.4 % ophthalmic solution Commonly known as: LIQUIFILM TEARS Place 1 drop into both eyes 4 (four) times daily.   Pro-Stat Liqd Take 30 mLs by mouth daily.   sulfamethoxazole-trimethoprim 800-160 MG tablet Commonly known as: BACTRIM DS TAKE 1 TABLET BY MOUTH DAILY.   torsemide 20 MG tablet Commonly known as: DEMADEX Take 10 mg by mouth daily.   Triamcinolone Acetonide 0.025 % Lotn Apply topically.   Ukoniq 200 MG Tabs Generic drug: Umbralisib Tosylate TAKE 2 TABLETS (400 MG) BY MOUTH DAILY.   white petrolatum ointment Apply topically 2 (two) times daily.   zinc oxide 20 % ointment Apply 1 application topically as needed for irritation. To buttocks after every incontinent episode and as needed for redness. May keep at bedside.       Review of Systems  Constitutional: Negative for fatigue, fever and unexpected weight change.  HENT: Positive for hearing loss. Negative for congestion, sinus pain and sore throat.   Eyes: Negative for visual disturbance.  Respiratory: Negative for cough, chest tightness and shortness of breath.   Cardiovascular: Positive for leg swelling. Negative for chest pain and palpitations.  Gastrointestinal: Negative for abdominal pain and constipation.  Genitourinary: Negative for difficulty urinating, dysuria, frequency and urgency.       Bartholin cysts.   Musculoskeletal: Positive for arthralgias, back pain and gait problem.       Ambulates with walker. C/o R hip to R ankle pain, more at night, less when she is up/walking but she felt it.   Skin: Negative for  color change.  Neurological: Negative for speech difficulty, weakness, light-headedness and headaches.  Psychiatric/Behavioral: Negative for behavioral problems, confusion and sleep disturbance. The patient is not nervous/anxious.     Immunization History  Administered Date(s) Administered  . Fluad Quad(high Dose 65+) 05/28/2019  . Influenza Split 07/12/2016, 08/13/2017, 06/02/2019, 09/09/2020  . Influenza, High Dose Seasonal PF 06/11/2017, 06/28/2018  . Influenza-Unspecified 06/21/2020  . Moderna Sars-Covid-2 Vaccination 10/05/2019, 11/02/2019, 08/15/2020  . Pneumococcal Conjugate-13 11/22/2014  . Pneumococcal Polysaccharide-23 04/29/2009, 08/13/2017  . Td 01/24/2013  . Tdap 12/28/2017, 07/01/2018, 12/13/2020  . Zoster 11/12/2009, 07/01/2018  . Zoster Recombinat (Shingrix) 07/01/2018   Pertinent  Health Maintenance Due  Topic Date Due  . INFLUENZA VACCINE  05/01/2021  . DEXA SCAN  Completed  . PNA vac Low Risk Adult  Completed   No flowsheet data found. Functional Status Survey:    Vitals:   01/04/21 0924  BP: 116/70  Pulse: 100  Resp: 18  Temp: (!) 97.5 F (36.4 C)  SpO2: 95%   There is no height or weight on file to calculate BMI. Physical Exam Vitals and nursing note reviewed.  Constitutional:  Appearance: Normal appearance.  HENT:     Nose: Nose normal.     Mouth/Throat:     Mouth: Mucous membranes are moist.  Eyes:     Extraocular Movements: Extraocular movements intact.     Conjunctiva/sclera: Conjunctivae normal.     Pupils: Pupils are equal, round, and reactive to light.  Cardiovascular:     Rate and Rhythm: Normal rate and regular rhythm.     Heart sounds: Murmur heard.    Pulmonary:     Effort: Pulmonary effort is normal.     Breath sounds: Normal breath sounds. No wheezing, rhonchi or rales.  Abdominal:     General: Bowel sounds are normal.     Palpations: Abdomen is soft.     Tenderness: There is no abdominal tenderness.   Musculoskeletal:     Cervical back: Normal range of motion and neck supple.     Right lower leg: Edema present.     Left lower leg: Edema present.     Comments: Trace edema BLE. C/o R hip to R ankle pain, more at night, less when she is up/walking but she felt it. No spinal spinous process tenderness, right hip tenderness, right knee tenderness, or right ankle tenderness palpated except R SIJ region.  Able to stand and walking.   Skin:    General: Skin is warm and dry.     Comments: Mild pigmented skin changes BLE  Neurological:     General: No focal deficit present.     Mental Status: She is alert and oriented to person, place, and time. Mental status is at baseline.     Gait: Gait abnormal.  Psychiatric:        Mood and Affect: Mood normal.        Behavior: Behavior normal.        Thought Content: Thought content normal.        Judgment: Judgment normal.     Labs reviewed: Recent Labs    08/27/20 0112 09/08/20 0000 10/14/20 1042 11/04/20 0000 11/10/20 0000 12/13/20 1020  NA 137   < > 144 138 137 138  K 3.5   < > 4.2 3.8 4.1 4.6  CL 102   < > 105 105 104 100  CO2 26   < > 31 25* 25* 29  GLUCOSE 99  --  131*  --   --  101*  BUN 16   < > 31* 20 16 41*  CREATININE 0.88   < > 1.21* 1.2* 1.3* 1.60*  CALCIUM 8.2*   < > 9.3 7.9* 8.0* 9.7   < > = values in this interval not displayed.   Recent Labs    08/24/20 2006 09/08/20 0000 10/14/20 1042 11/04/20 0000 12/13/20 1020  AST 31   < > 20 19 20   ALT 12   < > 8 9 8   ALKPHOS 62   < > 72 51 49  BILITOT 0.5  --  0.4  --  0.2*  PROT 7.1  --  6.6  --  6.7  ALBUMIN 1.9*   < > 2.3* 1.8* 2.6*   < > = values in this interval not displayed.   Recent Labs    08/27/20 0112 09/08/20 0000 10/14/20 1042 11/04/20 0000 12/13/20 1020  WBC 43.8*   < > 55.9* 74.0 92.6*  NEUTROABS 2.7   < > 3.9 2,886.00 5.0  HGB 9.9*   < > 9.4* 8.6* 10.6*  HCT 29.8*   < > 30.2* 27*  33.8*  MCV 103.5*  --  109.4*  --  108.0*  PLT 138*   < > 213  201 234   < > = values in this interval not displayed.   Lab Results  Component Value Date   TSH 27.99 (A) 09/08/2020   No results found for: HGBA1C No results found for: CHOL, HDL, LDLCALC, LDLDIRECT, TRIG, CHOLHDL  Significant Diagnostic Results in last 30 days:  CT Head Wo Contrast  Result Date: 12/13/2020 CLINICAL DATA:  Fall EXAM: CT HEAD WITHOUT CONTRAST TECHNIQUE: Contiguous axial images were obtained from the base of the skull through the vertex without intravenous contrast. COMPARISON:  08/25/2020 FINDINGS: Brain: There is no acute intracranial hemorrhage, mass effect, or edema. Gray-white differentiation is preserved. There is no extra-axial fluid collection. Patchy hypoattenuation in the supratentorial white matter is nonspecific but probably reflects stable chronic microvascular ischemic changes. Prominence of the ventricles and sulci reflects stable parenchymal volume loss. Vascular: There is atherosclerotic calcification at the skull base. Skull: Calvarium is unremarkable. Sinuses/Orbits: No acute finding. Other: Right posterior scalp hematoma. IMPRESSION: No evidence of acute intracranial injury. Stable chronic/nonemergent findings detailed above. Electronically Signed   By: Macy Mis M.D.   On: 12/13/2020 16:19   CT Cervical Spine Wo Contrast  Result Date: 12/13/2020 CLINICAL DATA:  Fall EXAM: CT CERVICAL SPINE WITHOUT CONTRAST TECHNIQUE: Multidetector CT imaging of the cervical spine was performed without intravenous contrast. Multiplanar CT image reconstructions were also generated. COMPARISON:  08/24/2020 FINDINGS: Alignment: Stable. Skull base and vertebrae: Stable vertebral body heights. No acute fracture. Soft tissues and spinal canal: No prevertebral fluid or swelling. No visible canal hematoma. Disc levels: Multilevel degenerative changes are present including disc space narrowing, endplate osteophytes, and facet and uncovertebral hypertrophy. Appearance is similar to  prior study. Upper chest: Scarring at the lung apices. Other: Calcified plaque at the common carotid bifurcations. IMPRESSION: No acute cervical spine fracture. Electronically Signed   By: Macy Mis M.D.   On: 12/13/2020 16:15    Assessment/Plan Right leg pain persisted right lower back SIJ and right hip pain, positional, able to bear weight and walk, negative fx per X-ray 12/26/20 lumbar, pelvis, hip, femur. Scheduled Tylenol and Mobic 01/01/21 is not adequate for pain control.  01/04/21 adding Robaxin 250mg  qd, may increase Mobic or adding Norco, repeat Xray if no better.   Bilateral leg edema prn  Torsemide  Bun/creat 41/1.6 12/13/20   Lymphoma, marginal zone, lymph nodes of multiple sites ([place]) Marginal zone lymphoma, f/u oncology, [id number] 12/13/20  Hyponatremia Na 138 12/13/20   Hypothyroidism  takes Levothyroxine, f/u TSH, TSH 27.99 09/08/20, repeat TSH   Cerebellar infarct ([place]) Chronic left cerebellar infarct, hx of, no apparent focal residual.  Gait abnormality Gait abnormality, therapy. Walker.   Fall Frequent falls   CKD (chronic kidney disease) stage 3, GFR 30-59 ml/min ([facility]) Bun/creat 41/1.6 12/13/20   Anemia in neoplastic disease Hgb 10.6 12/13/20   Osteoporosis DEXA 02/25/20 tscore -3.8   Migraines off Propranolol.    HTN (hypertension)  recommended MAP range 70-90, off Propranolol   Trigeminal neuralgia  takes  Gabapentin.   Bartholin gland cyst Bartholin gland cysts on each labiaper report.     Family/ staff Communication: plan of care reviewed with the patient and charge nurse.   Labs/tests ordered:  none  Time spend 40 minutes.

## 2021-01-04 NOTE — Assessment & Plan Note (Signed)
Gait abnormality, therapy. Walker.

## 2021-01-04 NOTE — Assessment & Plan Note (Signed)
DEXA 02/25/20 tscore -3.8 

## 2021-01-04 NOTE — Assessment & Plan Note (Signed)
Bun/creat 41/1.6 12/13/20

## 2021-01-04 NOTE — Assessment & Plan Note (Signed)
Hgb 10.6 12/13/20

## 2021-01-04 NOTE — Assessment & Plan Note (Signed)
Na 138 12/13/20

## 2021-01-04 NOTE — Assessment & Plan Note (Signed)
Bartholin gland cysts on each labiaper report.

## 2021-01-04 NOTE — Assessment & Plan Note (Signed)
Chronic left cerebellar infarct, hx of, no apparent focal residual.  

## 2021-01-04 NOTE — Assessment & Plan Note (Signed)
Marginal zone lymphoma, f/u oncology, [id number] 12/13/20

## 2021-01-04 NOTE — Assessment & Plan Note (Signed)
Frequent falls 

## 2021-01-04 NOTE — Assessment & Plan Note (Signed)
persisted right lower back SIJ and right hip pain, positional, able to bear weight and walk, negative fx per X-ray 12/26/20 lumbar, pelvis, hip, femur. Scheduled Tylenol and Mobic 01/01/21 is not adequate for pain control.  01/04/21 adding Robaxin 250mg  qd, may increase Mobic or adding Norco, repeat Xray if no better.

## 2021-01-04 NOTE — Assessment & Plan Note (Signed)
prn  Torsemide  Bun/creat 41/1.6 12/13/20

## 2021-01-04 NOTE — Assessment & Plan Note (Signed)
takes Gabapentin  

## 2021-01-04 NOTE — Assessment & Plan Note (Signed)
off Propranolol.

## 2021-01-04 NOTE — Assessment & Plan Note (Signed)
takes Levothyroxine, f/u TSH, TSH 27.99 09/08/20, repeat TSH

## 2021-01-05 ENCOUNTER — Other Ambulatory Visit (HOSPITAL_COMMUNITY): Payer: Self-pay

## 2021-01-06 ENCOUNTER — Encounter: Payer: Self-pay | Admitting: Nurse Practitioner

## 2021-01-09 ENCOUNTER — Other Ambulatory Visit (HOSPITAL_COMMUNITY): Payer: Self-pay

## 2021-01-11 ENCOUNTER — Other Ambulatory Visit (HOSPITAL_COMMUNITY): Payer: Self-pay

## 2021-01-12 ENCOUNTER — Other Ambulatory Visit (HOSPITAL_COMMUNITY): Payer: Self-pay

## 2021-01-16 ENCOUNTER — Other Ambulatory Visit (HOSPITAL_COMMUNITY): Payer: Self-pay

## 2021-01-17 ENCOUNTER — Other Ambulatory Visit (HOSPITAL_COMMUNITY): Payer: Self-pay

## 2021-01-17 MED FILL — Umbralisib Tosylate Tab 200 MG: ORAL | 30 days supply | Qty: 60 | Fill #0 | Status: AC

## 2021-01-18 ENCOUNTER — Other Ambulatory Visit (HOSPITAL_COMMUNITY): Payer: Self-pay

## 2021-01-25 ENCOUNTER — Inpatient Hospital Stay: Payer: Medicare Other | Admitting: Hematology & Oncology

## 2021-01-25 ENCOUNTER — Inpatient Hospital Stay: Payer: Medicare Other | Attending: Family

## 2021-02-09 ENCOUNTER — Other Ambulatory Visit (HOSPITAL_COMMUNITY): Payer: Self-pay

## 2021-02-13 ENCOUNTER — Other Ambulatory Visit (HOSPITAL_COMMUNITY): Payer: Self-pay

## 2021-02-15 ENCOUNTER — Other Ambulatory Visit (HOSPITAL_COMMUNITY): Payer: Self-pay

## 2021-02-20 ENCOUNTER — Other Ambulatory Visit: Payer: Self-pay | Admitting: Hematology & Oncology

## 2021-02-20 ENCOUNTER — Other Ambulatory Visit (HOSPITAL_COMMUNITY): Payer: Self-pay

## 2021-02-20 MED ORDER — UKONIQ 200 MG PO TABS
ORAL_TABLET | ORAL | 3 refills | Status: DC
  Filled 2021-02-20: qty 60, 30d supply, fill #0

## 2021-02-21 ENCOUNTER — Other Ambulatory Visit (HOSPITAL_COMMUNITY): Payer: Self-pay

## 2021-02-22 ENCOUNTER — Other Ambulatory Visit (HOSPITAL_COMMUNITY): Payer: Self-pay

## 2021-03-02 ENCOUNTER — Other Ambulatory Visit (HOSPITAL_COMMUNITY): Payer: Self-pay

## 2021-03-06 ENCOUNTER — Non-Acute Institutional Stay: Payer: Medicare Other | Admitting: Orthopedic Surgery

## 2021-03-06 ENCOUNTER — Encounter: Payer: Self-pay | Admitting: Orthopedic Surgery

## 2021-03-06 DIAGNOSIS — M79604 Pain in right leg: Secondary | ICD-10-CM | POA: Diagnosis not present

## 2021-03-06 DIAGNOSIS — H5213 Myopia, bilateral: Secondary | ICD-10-CM

## 2021-03-06 DIAGNOSIS — C8588 Other specified types of non-Hodgkin lymphoma, lymph nodes of multiple sites: Secondary | ICD-10-CM | POA: Diagnosis not present

## 2021-03-06 DIAGNOSIS — G500 Trigeminal neuralgia: Secondary | ICD-10-CM

## 2021-03-06 DIAGNOSIS — I639 Cerebral infarction, unspecified: Secondary | ICD-10-CM

## 2021-03-06 DIAGNOSIS — I10 Essential (primary) hypertension: Secondary | ICD-10-CM

## 2021-03-06 DIAGNOSIS — R269 Unspecified abnormalities of gait and mobility: Secondary | ICD-10-CM

## 2021-03-06 DIAGNOSIS — D630 Anemia in neoplastic disease: Secondary | ICD-10-CM

## 2021-03-06 DIAGNOSIS — E039 Hypothyroidism, unspecified: Secondary | ICD-10-CM

## 2021-03-06 DIAGNOSIS — N1831 Chronic kidney disease, stage 3a: Secondary | ICD-10-CM

## 2021-03-06 DIAGNOSIS — R600 Localized edema: Secondary | ICD-10-CM

## 2021-03-06 DIAGNOSIS — M810 Age-related osteoporosis without current pathological fracture: Secondary | ICD-10-CM

## 2021-03-06 NOTE — Progress Notes (Signed)
Location:   [facility] Room Number: 35 Place of Service:  ALF [phone]) Provider:  Windell Moulding, NP  Virgie Dad, MD  Patient Care Team: Virgie Dad, MD as PCP - General (Internal Medicine)  Extended Emergency Contact Information Primary Emergency Contact: Genia Plants Address: [address]          [place], [place] [address] Johnnette Litter of [place] Phone: [phone] Relation: Nephew Secondary Emergency Contact: [name] Address: [address]          [place],  [address] Johnnette Litter of [place] Phone: [phone] Mobile Phone: [phone] Relation: Nephew  Code Status:   Goals of care: Advanced Directive information Advanced Directives 12/13/2020  Does Patient Have a Medical Advance Directive? Yes  Type of Paramedic of [place];Living will  Does patient want to make changes to medical advance directive? -  Copy of [place] in Chart? -  Would patient like information on creating a medical advance directive? -     Chief Complaint  Patient presents with  . Medical Management of Chronic Issues    Routine follo wup visit.  Marland Kitchen Health Maintenance    Pneumonia vaccine,Zoster vaccine, 2nd  COVID booster    HPI:  Pt is a 87 y.o. female seen today for medical management of chronic diseases.    She currently resides in assisted living at [facility]. Past medical history includes: hypertension, migraines, hypothyroidism, cerebellar infarct, trigeminal neuralgia, lymphoma, CKD stage III, anemia of neoplastic disease, and gait abnormality.   Right leg pain improved with scheduled tylenol and prn robaxin. Ambulates with walker, no recent falls or injuries.  BLE stable with prn torsemide, BUN/Creat 41/1.60 12/13/2020 Lymphoma, followed by oncology, advised to f/u in 6 weeks- not scheduled. WBC 92.6 03/15/2021.  Hypothyroidism, TSH 4.09 03/02/2021, c/o fatigue and increased sleeping today,  levothyroxine 112.5 mcg daily.  Hx Left cerebellar infarct, no focal residual.  Gait abnormality, ambulated with walker. No recent falls.  CKD stage III, BUN/Creat 41/1.60 12/13/2020 Anemia, hgb 10.6 12/13/2020 Osteoporosis, DEXA 02/25/2020, tscore -3.8 Trigeminal neuralgia, remains on gabapentin.   Recent blood pressures:  06/06- 139/79  06/05- 108/62  06/04- 129/72  Recent weights:  05/01- 124 lbs  04/11- 122.2 lbs  03/03- 121.6 lbs  Requesting appointment for annual eye exam. C/o some changes in her distance vision. Sometimes seeing floaters. Uses readers.   Nurse does not report any other concerns, vitals stable.     Past Medical History:  Diagnosis Date  . Cancer [facility])    non hodgkin lymphoma  . Chronic lymphocytic leukemia ([place])   . History of B-cell lymphoma 11/02/2015  . Migraines   . Osteoporosis   . Thyroid disease   . Trigeminal neuralgia of left side of face    Past Surgical History:  Procedure Laterality Date  . gamma knife     for trigeminal neuralgia  . SHOULDER SURGERY     Right shoulder replacement  . TONSILLECTOMY AND ADENOIDECTOMY    . WISDOM TOOTH EXTRACTION      Allergies  Allergen Reactions  . Penicillins Hives  . Carbamazepine Other (See Comments) and Hives    Turned purple from neck down and drowsy Other reaction(s): Other (See Comments) Turned purple from neck down and drowsy  . Pregabalin Other (See Comments)    Drowsy Other reaction(s): Confusion (intolerance) Drowsy    Allergies as of 03/06/2021      Reactions   Penicillins Hives  Carbamazepine Other (See Comments), Hives   Turned purple from neck down and drowsy Other reaction(s): Other (See Comments) Turned purple from neck down and drowsy   Pregabalin Other (See Comments)   Drowsy Other reaction(s): Confusion (intolerance) Drowsy      Medication List       Accurate as of March 06, 2021 12:42 PM. If you have any questions, ask your nurse or doctor.        STOP  taking these medications   Triamcinolone Acetonide 0.025 % Lotn Stopped by: Yvonna Alanis, NP     TAKE these medications   acetaminophen 500 MG tablet Commonly known as: TYLENOL Take 500 mg by mouth every 6 (six) hours.   acetaminophen 500 MG tablet Commonly known as: TYLENOL Take 500 mg by mouth 3 (three) times daily.   aspirin 81 MG chewable tablet Chew 81 mg by mouth daily.   famciclovir 125 MG tablet Commonly known as: FAMVIR TAKE 2 TABLETS (250MG ) BY MOUTH DAILY   gabapentin 800 MG tablet Commonly known as: NEURONTIN Take 800 mg by mouth 3 (three) times daily.   lactose free nutrition Liqd Take 237 mLs by mouth daily.   levothyroxine 100 MCG tablet Commonly known as: SYNTHROID Take 100 mcg by mouth daily before breakfast.   levothyroxine 25 MCG tablet Commonly known as: SYNTHROID Take 25 mcg by mouth daily before breakfast. 1/2 tab (12.5), oral, Once A Day, total of 112.5 mcg po q am   Multi-Vitamins Tabs Take 1 tablet by mouth daily.   polyvinyl alcohol 1.4 % ophthalmic solution Commonly known as: LIQUIFILM TEARS Place 1 drop into both eyes 4 (four) times daily.   Pro-Stat Liqd Take 30 mLs by mouth daily.   sulfamethoxazole-trimethoprim 800-160 MG tablet Commonly known as: BACTRIM DS TAKE 1 TABLET BY MOUTH DAILY.   torsemide 20 MG tablet Commonly known as: DEMADEX Take 10 mg by mouth daily.   Ukoniq 200 MG Tabs Generic drug: Umbralisib Tosylate TAKE 2 TABLETS (400 MG) BY MOUTH DAILY.   white petrolatum ointment Apply topically 2 (two) times daily.   zinc oxide 20 % ointment Apply 1 application topically as needed for irritation. To buttocks after every incontinent episode and as needed for redness. May keep at bedside.       Review of Systems  Constitutional: Positive for fatigue. Negative for activity change and appetite change.  HENT: Negative for dental problem, hearing loss and trouble swallowing.   Eyes: Positive for visual disturbance.        Floaters, change in distance vision   Respiratory: Negative for cough, shortness of breath and wheezing.   Cardiovascular: Negative for chest pain and leg swelling.  Gastrointestinal: Negative for abdominal distention, abdominal pain, constipation, diarrhea and nausea.  Genitourinary: Negative for dysuria, frequency and hematuria.  Musculoskeletal: Positive for arthralgias, gait problem and myalgias.  Skin:       Dry skin  Neurological: Positive for weakness. Negative for dizziness and headaches.  Psychiatric/Behavioral: Negative for confusion, dysphoric mood and sleep disturbance. The patient is not nervous/anxious.     Immunization History  Administered Date(s) Administered  . Fluad Quad(high Dose 65+) 05/28/2019  . Influenza Split 07/12/2016, 08/13/2017, 06/02/2019, 09/09/2020  . Influenza, High Dose Seasonal PF 06/11/2017, 06/28/2018  . Influenza-Unspecified 06/21/2020  . Moderna Sars-Covid-2 Vaccination 10/05/2019, 11/02/2019, 08/15/2020  . Pneumococcal Conjugate-13 11/22/2014  . Pneumococcal Polysaccharide-23 04/29/2009, 08/13/2017  . Td 01/24/2013  . Tdap 12/28/2017, 07/01/2018, 12/13/2020  . Zoster Recombinat (Shingrix) 07/01/2018  . Zoster, Live  11/12/2009, 07/01/2018   Pertinent  Health Maintenance Due  Topic Date Due  . INFLUENZA VACCINE  05/01/2021  . DEXA SCAN  Completed  . PNA vac Low Risk Adult  Completed   No flowsheet data found. Functional Status Survey:    Vitals:   03/06/21 1207  BP: 139/79  Pulse: 89  Temp: (!) 96.6 F (35.9 C)  SpO2: 97%  Weight: 124 lb (56.2 kg)  Height: 5\' 6"  (1.676 m)   Body mass index is 20.01 kg/m. Physical Exam Vitals reviewed.  HENT:     Head: Normocephalic.     Right Ear: There is no impacted cerumen.     Left Ear: There is no impacted cerumen.     Nose: Nose normal.     Mouth/Throat:     Mouth: Mucous membranes are moist.  Eyes:     General:        Right eye: No discharge.        Left eye: No  discharge.  Neck:     Thyroid: No thyroid mass or thyromegaly.  Cardiovascular:     Rate and Rhythm: Normal rate and regular rhythm.     Pulses: Normal pulses.     Heart sounds: Normal heart sounds. No murmur heard.   Pulmonary:     Effort: Pulmonary effort is normal. No respiratory distress.     Breath sounds: Normal breath sounds. No wheezing.  Abdominal:     General: Bowel sounds are normal. There is no distension.     Palpations: Abdomen is soft.     Tenderness: There is no abdominal tenderness.  Musculoskeletal:     Cervical back: Normal range of motion.     Right lower leg: Edema present.     Left lower leg: Edema present.     Comments: Non-pitting  Lymphadenopathy:     Cervical: No cervical adenopathy.  Skin:    General: Skin is warm and dry.     Capillary Refill: Capillary refill takes less than 2 seconds.  Neurological:     General: No focal deficit present.     Mental Status: She is alert and oriented to person, place, and time.     Motor: Weakness present.     Gait: Gait abnormal.     Comments: walker  Psychiatric:        Mood and Affect: Mood normal.        Behavior: Behavior normal.     Labs reviewed: Recent Labs    08/27/20 0112 09/08/20 0000 10/14/20 1042 11/04/20 0000 11/10/20 0000 12/13/20 1020  NA 137   < > 144 138 137 138  K 3.5   < > 4.2 3.8 4.1 4.6  CL 102   < > 105 105 104 100  CO2 26   < > 31 25* 25* 29  GLUCOSE 99  --  131*  --   --  101*  BUN 16   < > 31* 20 16 41*  CREATININE 0.88   < > 1.21* 1.2* 1.3* 1.60*  CALCIUM 8.2*   < > 9.3 7.9* 8.0* 9.7   < > = values in this interval not displayed.   Recent Labs    08/24/20 2006 09/08/20 0000 10/14/20 1042 11/04/20 0000 12/13/20 1020  AST 31   < > 20 19 20   ALT 12   < > 8 9 8   ALKPHOS 62   < > 72 51 49  BILITOT 0.5  --  0.4  --  0.2*  PROT 7.1  --  6.6  --  6.7  ALBUMIN 1.9*   < > 2.3* 1.8* 2.6*   < > = values in this interval not displayed.   Recent Labs    08/27/20 0112  09/08/20 0000 10/14/20 1042 11/04/20 0000 12/13/20 1020  WBC 43.8*   < > 55.9* 74.0 92.6*  NEUTROABS 2.7   < > 3.9 2,886.00 5.0  HGB 9.9*   < > 9.4* 8.6* 10.6*  HCT 29.8*   < > 30.2* 27* 33.8*  MCV 103.5*  --  109.4*  --  108.0*  PLT 138*   < > 213 201 234   < > = values in this interval not displayed.   Lab Results  Component Value Date   TSH 27.99 (A) 09/08/2020   No results found for: HGBA1C No results found for: CHOL, HDL, LDLCALC, LDLDIRECT, TRIG, CHOLHDL  Significant Diagnostic Results in last 30 days:  No results found.  Assessment/Plan 1. Right leg pain - 03/28 no acute abnormality on x-ray of right hip and back - pain improved with scheduled tylenol and prn robaxin  2. Bilateral leg edema - non-pitting today - cont prn torsemide  3. Lymphoma, marginal zone, lymph nodes of multiple sites ([place]) - WBC 92 12/13/2020 - followed by oncology- requested 6 week f/u- never done - recommend rescheduling f/u with Dr. Marin Olp  4. Hypothyroidism, unspecified type - TSH 4.09 - reports increased sleeping (1/2 day) and feeling fatigued - increase synthroid to 125 mcg po daily - recheck TSH in 6 weeks  5. Cerebellar infarct ([place]) - no focal residual  6. Gait abnormality - no recent falls - cont using walker - cont falls safety precautions  7. Stage 3a chronic kidney disease ([place]) - BUN/Creat 41/1.60 12/13/2020 - continue to svid nephrotoxic drugs like NSAIDS and dose adjust medications to be renally adjusted - encourage hydration  8. Anemia in neoplastic disease - hgb 10.6 12/13/2020  9. Age-related osteoporosis without current pathological fracture - DEXA 02/25/2020 - t score -3.8 - cont falls safety precautions  10. Trigeminal neuralgia - stable with gabapentin  11. Myopia of both eyes - changes in distance vision, some floaters - schedule annual eye exam  12. Primary hypertension - stable without medication  Family/ staff Communication: plan  discussed with patient and nurse  Labs/tests ordered:  none

## 2021-03-07 ENCOUNTER — Other Ambulatory Visit (HOSPITAL_COMMUNITY): Payer: Self-pay

## 2021-03-07 ENCOUNTER — Telehealth: Payer: Self-pay | Admitting: *Deleted

## 2021-03-07 NOTE — Telephone Encounter (Signed)
Call received from [facility] from [place] to inform Dr. Marin Olp that Joy Lynch is no longer available and would like to know if there is an alternative medication that Dr. Marin Olp would like to prescribe in place of Ukoniq.  Dr. Marin Olp notified of above.  Sabino Donovan notified per order of Dr. Marin Olp that there is no alternative for Ukoniq at this time.  Sabino Donovan has no other questions at this time.

## 2021-03-15 ENCOUNTER — Other Ambulatory Visit (HOSPITAL_COMMUNITY): Payer: Self-pay

## 2021-03-17 ENCOUNTER — Other Ambulatory Visit (HOSPITAL_COMMUNITY): Payer: Self-pay

## 2021-03-20 ENCOUNTER — Encounter: Payer: Self-pay | Admitting: Orthopedic Surgery

## 2021-03-20 ENCOUNTER — Non-Acute Institutional Stay: Payer: Medicare Other | Admitting: Orthopedic Surgery

## 2021-03-20 DIAGNOSIS — C8588 Other specified types of non-Hodgkin lymphoma, lymph nodes of multiple sites: Secondary | ICD-10-CM

## 2021-03-20 DIAGNOSIS — R531 Weakness: Secondary | ICD-10-CM | POA: Diagnosis not present

## 2021-03-20 DIAGNOSIS — M79604 Pain in right leg: Secondary | ICD-10-CM | POA: Diagnosis not present

## 2021-03-20 DIAGNOSIS — R600 Localized edema: Secondary | ICD-10-CM | POA: Diagnosis not present

## 2021-03-20 DIAGNOSIS — R269 Unspecified abnormalities of gait and mobility: Secondary | ICD-10-CM

## 2021-03-20 DIAGNOSIS — E039 Hypothyroidism, unspecified: Secondary | ICD-10-CM

## 2021-03-20 DIAGNOSIS — W19XXXA Unspecified fall, initial encounter: Secondary | ICD-10-CM | POA: Diagnosis not present

## 2021-03-20 DIAGNOSIS — N1831 Chronic kidney disease, stage 3a: Secondary | ICD-10-CM

## 2021-03-20 NOTE — Progress Notes (Signed)
Location:  [facility] Room Number: AL 9 Place of Service:    Provider:  Windell Moulding, AGNP-C  Virgie Dad, MD  Patient Care Team: Virgie Dad, MD as PCP - General (Internal Medicine)  Extended Emergency Contact Information Primary Emergency Contact: Genia Plants Address: [address]          [place], [place] [address] Johnnette Litter of [place] Phone: [phone] Relation: Alanson Puls Secondary Emergency Contact: [name] Address: [address]          [place], [place] [address] Johnnette Litter of [place] Phone: [phone] Mobile Phone: [phone] Relation: Nephew  Code Status: Full code Goals of care: Advanced Directive information Advanced Directives 12/13/2020  Does Patient Have a Medical Advance Directive? Yes  Type of Paramedic of [place];Living will  Does patient want to make changes to medical advance directive? -  Copy of [place] in Chart? -  Would patient like information on creating a medical advance directive? -     Chief Complaint  Patient presents with   Acute Visit    Weakness, recent fall    HPI:  Pt is a 87 y.o. female seen today for acute visit due to weakness and recent fall.   She currently resides in assisted living at [facility]. Past medical history includes: hypertension, migraines, hypothyroidism, cerebellar infarct, trigeminal neuralgia, lymphoma, CKD stage III, anemia of neoplastic disease, and gait abnormality.  06/17 she fell in the [facility] parking lot after getting her hair done. She reports getting out of her friends car, when her legs suddenly gave out and she fell to the floor. No injuries to head or neck. No other injuries reported. Today, when discussing incident, she states she did not "feel herself" prior to hair appointment. She believes she may have been dehydrated. Recently seen for right leg pain 06/06, she denies any joint pain today.  Denies chest pain or sob.   Nurse does not report any other concerns, vitals stable.   Past Medical History:  Diagnosis Date   Cancer [facility])    non hodgkin lymphoma   Chronic lymphocytic leukemia ([facility])    History of B-cell lymphoma 11/02/2015   Migraines    Osteoporosis    Thyroid disease    Trigeminal neuralgia of left side of face    Past Surgical History:  Procedure Laterality Date   gamma knife     for trigeminal neuralgia   SHOULDER SURGERY     Right shoulder replacement   TONSILLECTOMY AND ADENOIDECTOMY     WISDOM TOOTH EXTRACTION      Allergies  Allergen Reactions   Penicillins Hives   Carbamazepine Other (See Comments) and Hives    Turned purple from neck down and drowsy Other reaction(s): Other (See Comments) Turned purple from neck down and drowsy   Pregabalin Other (See Comments)    Drowsy Other reaction(s): Confusion (intolerance) Drowsy    Outpatient Encounter Medications as of 03/20/2021  Medication Sig   acetaminophen (TYLENOL) 500 MG tablet Take 500 mg by mouth every 6 (six) hours.   acetaminophen (TYLENOL) 500 MG tablet Take 500 mg by mouth 3 (three) times daily.   Amino Acids-Protein Hydrolys (PRO-STAT) LIQD Take 30 mLs by mouth daily.   aspirin 81 MG chewable tablet Chew 81 mg by mouth daily.   famciclovir (FAMVIR) 125 MG tablet TAKE 2 TABLETS (250MG ) BY MOUTH DAILY   gabapentin (NEURONTIN) 800 MG tablet Take 800 mg  by mouth 3 (three) times daily.   lactose free nutrition (BOOST) LIQD Take 237 mLs by mouth daily.   levothyroxine (SYNTHROID) 100 MCG tablet Take 100 mcg by mouth daily before breakfast.   levothyroxine (SYNTHROID) 25 MCG tablet Take 25 mcg by mouth daily before breakfast.     Multiple Vitamin (MULTI-VITAMINS) TABS Take 1 tablet by mouth daily.   polyvinyl alcohol (LIQUIFILM TEARS) 1.4 % ophthalmic solution Place 1 drop into both eyes 4 (four) times daily.   sulfamethoxazole-trimethoprim (BACTRIM DS) 800-160 MG tablet TAKE 1 TABLET BY  MOUTH DAILY.   torsemide (DEMADEX) 20 MG tablet Take 10 mg by mouth daily.   Umbralisib Tosylate (UKONIQ) 200 MG TABS TAKE 2 TABLETS (400 MG) BY MOUTH DAILY.   white petrolatum ointment Apply topically 2 (two) times daily.   zinc oxide 20 % ointment Apply 1 application topically as needed for irritation. To buttocks after every incontinent episode and as needed for redness. May keep at bedside.   No facility-administered encounter medications on file as of 03/20/2021.    Review of Systems  Constitutional:  Negative for activity change, appetite change, fatigue and fever.  Respiratory:  Negative for cough, shortness of breath and wheezing.   Cardiovascular:  Negative for chest pain and leg swelling.  Musculoskeletal:  Positive for gait problem. Negative for arthralgias and myalgias.  Skin: Negative.   Neurological:  Positive for weakness. Negative for dizziness and headaches.  Psychiatric/Behavioral:  Negative for dysphoric mood and sleep disturbance. The patient is not nervous/anxious.    Immunization History  Administered Date(s) Administered   Fluad Quad(high Dose 65+) 05/28/2019   Influenza Split 07/12/2016, 08/13/2017, 06/02/2019, 09/09/2020   Influenza, High Dose Seasonal PF 06/11/2017, 06/28/2018   Influenza-Unspecified 06/21/2020   Moderna SARS-COV2 Booster Vaccination 03/08/2021   Moderna Sars-Covid-2 Vaccination 10/05/2019, 11/02/2019, 08/15/2020   Pneumococcal Conjugate-13 11/22/2014   Pneumococcal Polysaccharide-23 04/29/2009, 08/13/2017   Td 01/24/2013   Tdap 12/28/2017, 07/01/2018, 12/13/2020   Zoster Recombinat (Shingrix) 07/01/2018   Zoster, Live 11/12/2009, 07/01/2018   Pertinent  Health Maintenance Due  Topic Date Due   INFLUENZA VACCINE  05/01/2021   DEXA SCAN  Completed   PNA vac Low Risk Adult  Completed   No flowsheet data found. Functional Status Survey:    Vitals:   03/20/21 1343  BP: 134/70  Pulse: 98  Resp: 18  SpO2: 97%  Weight: 124 lb (56.2  kg)  Height: 5\' 6"  (1.676 m)   Body mass index is 20.01 kg/m. Physical Exam Vitals reviewed.  Constitutional:      General: She is not in acute distress. HENT:     Head: Normocephalic.  Cardiovascular:     Rate and Rhythm: Normal rate and regular rhythm.     Pulses: Normal pulses.     Heart sounds: Normal heart sounds. No murmur heard. Pulmonary:     Effort: Pulmonary effort is normal. No respiratory distress.     Breath sounds: Normal breath sounds. No wheezing.  Abdominal:     General: Bowel sounds are normal. There is no distension.     Palpations: Abdomen is soft.     Tenderness: There is no abdominal tenderness.  Musculoskeletal:     Right lower leg: No edema.     Left lower leg: No edema.  Skin:    General: Skin is warm and dry.     Capillary Refill: Capillary refill takes less than 2 seconds.  Neurological:     General: No focal deficit present.  Mental Status: She is alert and oriented to person, place, and time.     Motor: Weakness present.     Gait: Gait abnormal.     Comments: walker  Psychiatric:        Mood and Affect: Mood normal.        Behavior: Behavior normal.    Labs reviewed: Recent Labs    08/27/20 0112 09/08/20 0000 10/14/20 1042 11/04/20 0000 11/10/20 0000 12/13/20 1020  NA 137   < > 144 138 137 138  K 3.5   < > 4.2 3.8 4.1 4.6  CL 102   < > 105 105 104 100  CO2 26   < > 31 25* 25* 29  GLUCOSE 99  --  131*  --   --  101*  BUN 16   < > 31* 20 16 41*  CREATININE 0.88   < > 1.21* 1.2* 1.3* 1.60*  CALCIUM 8.2*   < > 9.3 7.9* 8.0* 9.7   < > = values in this interval not displayed.   Recent Labs    08/24/20 2006 09/08/20 0000 10/14/20 1042 11/04/20 0000 12/13/20 1020  AST 31   < > 20 19 20   ALT 12   < > 8 9 8   ALKPHOS 62   < > 72 51 49  BILITOT 0.5  --  0.4  --  0.2*  PROT 7.1  --  6.6  --  6.7  ALBUMIN 1.9*   < > 2.3* 1.8* 2.6*   < > = values in this interval not displayed.   Recent Labs    08/27/20 0112 09/08/20 0000  10/14/20 1042 11/04/20 0000 12/13/20 1020  WBC 43.8*   < > 55.9* 74.0 92.6*  NEUTROABS 2.7   < > 3.9 2,886.00 5.0  HGB 9.9*   < > 9.4* 8.6* 10.6*  HCT 29.8*   < > 30.2* 27* 33.8*  MCV 103.5*  --  109.4*  --  108.0*  PLT 138*   < > 213 201 234   < > = values in this interval not displayed.   Lab Results  Component Value Date   TSH 27.99 (A) 09/08/2020   No results found for: HGBA1C No results found for: CHOL, HDL, LDLCALC, LDLDIRECT, TRIG, CHOLHDL  Significant Diagnostic Results in last 30 days:  No results found.  Assessment/Plan 1. Weakness generalized - fall 06/17, reports not feeling herself and legs giving out - suspect possible dehydration - recommend hydration with water- 6-8 glasses daily - bmp  2. Fall, initial encounter - see above - cont to ambulate with walker  3. Right leg pain - denies pain today - cont tylenol 500 mg tid - cont gabapentin 800 mg tid - cont robaxin 250 mg daily   -cont to ambulate with walker  4. Bilateral leg edema - no edema today  - cont torsemide 10 mg QOD - cont leg elevation daily  5. Gait abnormality  -cont walker - cont PT  6. Lymphoma, marginal zone, lymph nodes of multiple sites ([place]) - WBC 92.6 12/13/2020 - followed by oncology - cbc/diff  7. Hypothyroidism, unspecified type - TSH 4.09 03/06/2021 - cont synthroid 125 mcg daily  8. Stage 3a chronic kidney disease ([place]) - BUN/Creat 41/1.60 12/13/2020 - cmp    Family/ staff Communication: plan discussed with patient and nurse  Labs/tests ordered:  cbc/diff, cmp

## 2021-03-28 ENCOUNTER — Other Ambulatory Visit (HOSPITAL_COMMUNITY): Payer: Self-pay

## 2021-04-17 ENCOUNTER — Telehealth: Payer: Self-pay | Admitting: *Deleted

## 2021-04-17 ENCOUNTER — Inpatient Hospital Stay (HOSPITAL_BASED_OUTPATIENT_CLINIC_OR_DEPARTMENT_OTHER): Payer: Medicare Other | Admitting: Family

## 2021-04-17 ENCOUNTER — Inpatient Hospital Stay: Payer: Medicare Other | Attending: Hematology & Oncology

## 2021-04-17 VITALS — BP 93/39 | HR 96 | Temp 97.7°F | Resp 20 | Wt 128.4 lb

## 2021-04-17 DIAGNOSIS — D509 Iron deficiency anemia, unspecified: Secondary | ICD-10-CM | POA: Diagnosis not present

## 2021-04-17 DIAGNOSIS — Z8572 Personal history of non-Hodgkin lymphomas: Secondary | ICD-10-CM

## 2021-04-17 DIAGNOSIS — C8588 Other specified types of non-Hodgkin lymphoma, lymph nodes of multiple sites: Secondary | ICD-10-CM

## 2021-04-17 DIAGNOSIS — C8590 Non-Hodgkin lymphoma, unspecified, unspecified site: Secondary | ICD-10-CM | POA: Insufficient documentation

## 2021-04-17 DIAGNOSIS — E8809 Other disorders of plasma-protein metabolism, not elsewhere classified: Secondary | ICD-10-CM

## 2021-04-17 DIAGNOSIS — D649 Anemia, unspecified: Secondary | ICD-10-CM | POA: Diagnosis not present

## 2021-04-17 LAB — CMP (CANCER CENTER ONLY)
ALT: 10 U/L (ref 0–44)
AST: 21 U/L (ref 15–41)
Albumin: 2.4 g/dL — ABNORMAL LOW (ref 3.5–5.0)
Alkaline Phosphatase: 80 U/L (ref 38–126)
Anion gap: 8 (ref 5–15)
BUN: 41 mg/dL — ABNORMAL HIGH (ref 8–23)
CO2: 25 mmol/L (ref 22–32)
Calcium: 8.7 mg/dL — ABNORMAL LOW (ref 8.9–10.3)
Chloride: 104 mmol/L (ref 98–111)
Creatinine: 1.77 mg/dL — ABNORMAL HIGH (ref 0.44–1.00)
GFR, Estimated: 26 mL/min — ABNORMAL LOW (ref >60)
Glucose, Bld: 85 mg/dL (ref 70–99)
Potassium: 4.5 mmol/L (ref 3.5–5.1)
Sodium: 137 mmol/L (ref 135–145)
Total Bilirubin: 0.2 mg/dL — ABNORMAL LOW (ref 0.3–1.2)
Total Protein: 6.1 g/dL — ABNORMAL LOW (ref 6.5–8.1)

## 2021-04-17 LAB — CBC WITH DIFFERENTIAL (CANCER CENTER ONLY)
Abs Immature Granulocytes: 0.07 10*3/uL (ref 0.00–0.07)
Basophils Absolute: 0.1 10*3/uL (ref 0.0–0.1)
Basophils Relative: 0 %
Eosinophils Absolute: 0.2 10*3/uL (ref 0.0–0.5)
Eosinophils Relative: 1 %
HCT: 29.2 % — ABNORMAL LOW (ref 36.0–46.0)
Hemoglobin: 9.4 g/dL — ABNORMAL LOW (ref 12.0–15.0)
Immature Granulocytes: 0 %
Lymphocytes Relative: 73 %
Lymphs Abs: 22.3 10*3/uL — ABNORMAL HIGH (ref 0.7–4.0)
MCH: 35.7 pg — ABNORMAL HIGH (ref 26.0–34.0)
MCHC: 32.2 g/dL (ref 30.0–36.0)
MCV: 111 fL — ABNORMAL HIGH (ref 80.0–100.0)
Monocytes Absolute: 3.2 10*3/uL — ABNORMAL HIGH (ref 0.1–1.0)
Monocytes Relative: 10 %
Neutro Abs: 4.7 10*3/uL (ref 1.7–7.7)
Neutrophils Relative %: 16 %
Platelet Count: 226 10*3/uL (ref 150–400)
RBC: 2.63 MIL/uL — ABNORMAL LOW (ref 3.87–5.11)
RDW: 14.4 % (ref 11.5–15.5)
WBC Count: 30.5 10*3/uL — ABNORMAL HIGH (ref 4.0–10.5)
nRBC: 0 % (ref 0.0–0.2)

## 2021-04-17 LAB — LACTATE DEHYDROGENASE: LDH: 285 U/L — ABNORMAL HIGH (ref 98–192)

## 2021-04-17 NOTE — Progress Notes (Signed)
Hematology and Oncology Follow Up Visit  Joy Lynch [id number] April 03, 1926 87 y.o. 04/17/2021   Principle Diagnosis:  Marginal Zone lymphoma -- relapsed   Current Therapy:        Umbralisib 400 mg po q day, started on 10/22/2020   Interim History:  Joy Lynch is here today for follow-up. Unfortunately, Umbralisib production was discontinued. She is finishing up what she has left as is does appear to have been effective for her.  I spoke with Dr. Marin Olp and we will now start her on Calquence.   WBC count is 30.5, Hgb 9.4, MCV 111 and platelets 226. Neutrophils 16% and lymphocytes 73%.  BUN is 41 and Creatinine 1.77. LDH is 285.  Beta 2 microglobulin level was 4.8 in March. IgM level was 2,952 mg/dL.  She notes fatigue and takes a break to rest as needed.  No fever, chills, n/v, cough, rash, dizziness, SOB, chest pain, palpitations, abdominal pain/bloating or changes in bowel or bladder habits.  No swelling, tenderness, numbness or tingling noted in her extremities at this time. Pedal pulses are 2+.  She states that she lost her footing and fell while getting out of a friend's care 10 days ago but thankfully was not seriously injured.  No syncope to report.  She has maintained a good appetite and is staying well hydrated. His weight is stable at 128 lbs.   ECOG Performance Status: 1 - Symptomatic but completely ambulatory  Medications:  Allergies as of 04/17/2021       Reactions   Penicillins Hives   Carbamazepine Other (See Comments), Hives   Turned purple from neck down and drowsy Other reaction(s): Other (See Comments) Turned purple from neck down and drowsy   Pregabalin Other (See Comments)   Drowsy Other reaction(s): Confusion (intolerance) Drowsy        Medication List        Accurate as of April 17, 2021  1:29 PM. If you have any questions, ask your nurse or doctor.          acetaminophen 500 MG tablet Commonly known as: TYLENOL Take 500 mg by mouth every 6  (six) hours.   acetaminophen 500 MG tablet Commonly known as: TYLENOL Take 500 mg by mouth 3 (three) times daily.   aspirin 81 MG chewable tablet Chew 81 mg by mouth daily.   famciclovir 125 MG tablet Commonly known as: FAMVIR TAKE 2 TABLETS (250MG ) BY MOUTH DAILY   gabapentin 800 MG tablet Commonly known as: NEURONTIN Take 800 mg by mouth 3 (three) times daily.   lactose free nutrition Liqd Take 237 mLs by mouth daily.   levothyroxine 100 MCG tablet Commonly known as: SYNTHROID Take 100 mcg by mouth daily before breakfast.   levothyroxine 25 MCG tablet Commonly known as: SYNTHROID Take 25 mcg by mouth daily before breakfast.   Multi-Vitamins Tabs Take 1 tablet by mouth daily.   polyvinyl alcohol 1.4 % ophthalmic solution Commonly known as: LIQUIFILM TEARS Place 1 drop into both eyes 4 (four) times daily.   Pro-Stat Liqd Take 30 mLs by mouth daily.   sulfamethoxazole-trimethoprim 800-160 MG tablet Commonly known as: BACTRIM DS TAKE 1 TABLET BY MOUTH DAILY.   torsemide 20 MG tablet Commonly known as: DEMADEX Take 10 mg by mouth every other day.   Ukoniq 200 MG Tabs Generic drug: Umbralisib Tosylate TAKE 2 TABLETS (400 MG) BY MOUTH DAILY.   white petrolatum ointment Apply topically 2 (two) times daily.   zinc oxide 20 % ointment Apply  1 application topically as needed for irritation. To buttocks after every incontinent episode and as needed for redness. May keep at bedside.        Allergies:  Allergies  Allergen Reactions   Penicillins Hives   Carbamazepine Other (See Comments) and Hives    Turned purple from neck down and drowsy Other reaction(s): Other (See Comments) Turned purple from neck down and drowsy   Pregabalin Other (See Comments)    Drowsy Other reaction(s): Confusion (intolerance) Drowsy    Past Medical History, Surgical history, Social history, and Family History were reviewed and updated.  Review of Systems: All other 10 point  review of systems is negative.   Physical Exam:  vitals were not taken for this visit.   Wt Readings from Last 3 Encounters:  03/20/21 124 lb (56.2 kg)  03/06/21 124 lb (56.2 kg)  12/26/20 123 lb 3.2 oz (55.9 kg)    Ocular: Sclerae unicteric, pupils equal, round and reactive to light Ear-nose-throat: Oropharynx clear, dentition fair Lymphatic: No cervical or supraclavicular adenopathy Lungs no rales or rhonchi, good excursion bilaterally Heart regular rate and rhythm, no murmur appreciated Abd soft, nontender, positive bowel sounds MSK no focal spinal tenderness, no joint edema Neuro: non-focal, well-oriented, appropriate affect Breasts: Deferred   Lab Results  Component Value Date   WBC 30.5 (H) 04/17/2021   HGB 9.4 (L) 04/17/2021   HCT 29.2 (L) 04/17/2021   MCV 111.0 (H) 04/17/2021   PLT 226 04/17/2021   No results found for: FERRITIN, IRON, TIBC, UIBC, IRONPCTSAT Lab Results  Component Value Date   RBC 2.63 (L) 04/17/2021   Lab Results  Component Value Date   KPAFRELGTCHN 13.0 12/13/2020   LAMBDASER 21.5 12/13/2020   KAPLAMBRATIO 0.60 12/13/2020   Lab Results  Component Value Date   IGGSERUM 151 (L) 12/13/2020   IGA 361 12/13/2020   IGMSERUM 2,952 (H) 12/13/2020   No results found for: Odetta Pink, SPEI   Chemistry      Component Value Date/Time   NA 138 12/13/2020 1020   NA 137 11/10/2020 0000   NA 138 08/16/2017 1256   K 4.6 12/13/2020 1020   K 4.3 08/16/2017 1256   CL 100 12/13/2020 1020   CO2 29 12/13/2020 1020   CO2 25 08/16/2017 1256   BUN 41 (H) 12/13/2020 1020   BUN 16 11/10/2020 0000   BUN 19.6 08/16/2017 1256   CREATININE 1.60 (H) 12/13/2020 1020   CREATININE 0.7 08/16/2017 1256   GLU 78 11/10/2020 0000      Component Value Date/Time   CALCIUM 9.7 12/13/2020 1020   CALCIUM 9.2 08/16/2017 1256   ALKPHOS 49 12/13/2020 1020   ALKPHOS 42 08/16/2017 1256   AST 20 12/13/2020 1020   AST  18 08/16/2017 1256   ALT 8 12/13/2020 1020   ALT 11 08/16/2017 1256   BILITOT 0.2 (L) 12/13/2020 1020   BILITOT 0.87 08/16/2017 1256       Impression and Plan: Joy Lynch is a very pleasant 87 yo caucasian female with relapsed marginal zone lymphoma.  Protein studies are pending. Dr. Marin Olp will place the order for her Calquence.  We have updated Katie, RN at [facility] of this change as well.  Iron studies are pending. We can replace if needed.  Follow-up in 6 weeks to assess her response.  She was encouraged to contact our office with any questions or concerns.   Laverna Peace, NP 7/18/20221:29 PM

## 2021-04-17 NOTE — Progress Notes (Signed)
04/17/2021 Spoke with staff from [facility], reconciled meds.

## 2021-04-17 NOTE — Telephone Encounter (Signed)
Per 04/17/21 los - gave patient upcoming appointment - patient confirmed

## 2021-04-18 LAB — BETA 2 MICROGLOBULIN, SERUM: Beta-2 Microglobulin: 6.5 mg/L — ABNORMAL HIGH (ref 0.6–2.4)

## 2021-04-18 LAB — FERRITIN: Ferritin: 65 ng/mL (ref 11–307)

## 2021-04-18 LAB — IRON AND TIBC
Iron: 48 ug/dL (ref 41–142)
Saturation Ratios: 23 % (ref 21–57)
TIBC: 209 ug/dL — ABNORMAL LOW (ref 236–444)
UIBC: 161 ug/dL (ref 120–384)

## 2021-04-18 LAB — IGG, IGA, IGM
IgA: 249 mg/dL (ref 64–422)
IgG (Immunoglobin G), Serum: 104 mg/dL — ABNORMAL LOW (ref 586–1602)
IgM (Immunoglobulin M), Srm: 1661 mg/dL — ABNORMAL HIGH (ref 26–217)

## 2021-04-18 LAB — KAPPA/LAMBDA LIGHT CHAINS
Kappa free light chain: 14.5 mg/L (ref 3.3–19.4)
Kappa, lambda light chain ratio: 0.62 (ref 0.26–1.65)
Lambda free light chains: 23.4 mg/L (ref 5.7–26.3)

## 2021-04-27 LAB — VITAMIN B12: Vitamin B-12: 297

## 2021-05-03 ENCOUNTER — Non-Acute Institutional Stay (INDEPENDENT_AMBULATORY_CARE_PROVIDER_SITE_OTHER): Payer: Medicare Other | Admitting: Orthopedic Surgery

## 2021-05-03 ENCOUNTER — Encounter: Payer: Self-pay | Admitting: Orthopedic Surgery

## 2021-05-03 DIAGNOSIS — Z0000 Encounter for general adult medical examination without abnormal findings: Secondary | ICD-10-CM | POA: Diagnosis not present

## 2021-05-03 NOTE — Progress Notes (Signed)
Subjective:   Joy Lynch is a 87 y.o. female who presents for Medicare Annual (Subsequent) preventive examination.  Review of Systems     Cardiac Risk Factors include: advanced age (>53men, >83 women);hypertension     Objective:    Today's Vitals   05/03/21 1018 05/03/21 1030  BP: 104/62   Pulse: 92   Resp: 18   Temp: (!) 97 F (36.1 C)   SpO2: 98%   Weight: 132 lb 9.6 oz (60.1 kg)   Height: 5\' 6"  (1.676 m)   PainSc:  0-No pain   Body mass index is 21.4 kg/m.  Advanced Directives 05/03/2021 04/17/2021 12/13/2020 10/14/2020 09/01/2020 02/18/2017 02/18/2017  Does Patient Have a Medical Advance Directive? Yes Yes Yes No Yes Yes No  Type of Paramedic of [place];Living will [place];Living will [place];Living will - [place];Living will [place];Living will -  Does patient want to make changes to medical advance directive? No - Patient declined - - No - Patient declined No - Patient declined No - Patient declined -  Copy of [place] in Chart? Yes - validated most recent copy scanned in chart (See row information) No - copy requested - - Yes - validated most recent copy scanned in chart (See row information) No - copy requested -  Would patient like information on creating a medical advance directive? - - - - - - -    Current Medications (verified) Outpatient Encounter Medications as of 05/03/2021  Medication Sig   acetaminophen (TYLENOL) 500 MG tablet Take 500 mg by mouth every 6 (six) hours.   Amino Acids-Protein Hydrolys (PRO-STAT) LIQD Take 30 mLs by mouth daily.   aspirin 81 MG chewable tablet Chew 81 mg by mouth daily.   famciclovir (FAMVIR) 125 MG tablet TAKE 2 TABLETS (250MG ) BY MOUTH DAILY   gabapentin (NEURONTIN) 800 MG tablet Take 800 mg by mouth 3 (three) times daily.   lactose free nutrition (BOOST) LIQD Take 237 mLs by mouth daily.    levothyroxine (SYNTHROID) 100 MCG tablet Take 100 mcg by mouth daily before breakfast.   methocarbamol (ROBAXIN) 500 MG tablet Take 500 mg by mouth daily.   Multiple Vitamin (MULTI-VITAMINS) TABS Take 1 tablet by mouth daily.   polyvinyl alcohol (LIQUIFILM TEARS) 1.4 % ophthalmic solution Place 1 drop into both eyes 4 (four) times daily.   sulfamethoxazole-trimethoprim (BACTRIM DS) 800-160 MG tablet Take 1 tablet by mouth daily.   torsemide (DEMADEX) 10 MG tablet 10 mg daily as needed.   white petrolatum ointment Apply topically 2 (two) times daily.   zinc oxide 20 % ointment Apply 1 application topically as needed for irritation. To buttocks after every incontinent episode and as needed for redness. May keep at bedside.   [DISCONTINUED] levothyroxine (SYNTHROID) 25 MCG tablet Take 25 mcg by mouth daily before breakfast.     [DISCONTINUED] Umbralisib Tosylate (UKONIQ) 200 MG TABS TAKE 2 TABLETS (400 MG) BY MOUTH DAILY.   No facility-administered encounter medications on file as of 05/03/2021.    Allergies (verified) Penicillins, Carbamazepine, and Pregabalin   History: Past Medical History:  Diagnosis Date   Cancer ([place])    non hodgkin lymphoma   Chronic lymphocytic leukemia (HCC)    History of B-cell lymphoma 11/02/2015   Migraines    Osteoporosis    Thyroid disease    Trigeminal neuralgia of left side of face    Past Surgical History:  Procedure  Laterality Date   gamma knife     for trigeminal neuralgia   SHOULDER SURGERY     Right shoulder replacement   TONSILLECTOMY AND ADENOIDECTOMY     WISDOM TOOTH EXTRACTION     Family History  Problem Relation Age of Onset   Cancer Father        unknown ca   Pneumonia Father    Cancer Brother        prostate ca   Stroke Brother    Heart attack Mother    Social History   Socioeconomic History   Marital status: Divorced    Spouse name: Not on file   Number of children: 0   Years of education: Masters   Highest education level:  Not on file  Occupational History   Occupation: Retired  Tobacco Use   Smoking status: Former    Packs/day: 0.50    Years: 20.00    Pack years: 10.00    Types: Cigarettes    Quit date: 10/02/1975    Years since quitting: 45.6   Smokeless tobacco: Never  Vaping Use   Vaping Use: Never used  Substance and Sexual Activity   Alcohol use: Yes    Alcohol/week: 2.0 standard drinks    Types: 2 Glasses of wine per week   Drug use: No   Sexual activity: Never    Comment: divorced, no children, sister in law next of kin. Retired Actuary for [employer]  Other Topics Concern   Not on file  Social History Narrative   Lives at home alone.   Right-handed.   4 cups caffeine per day.   Social Determinants of Health   Financial Resource Strain: Low Risk    Difficulty of Paying Living Expenses: Not hard at all  Food Insecurity: No Food Insecurity   Worried About Charity fundraiser in the Last Year: Never true   [place] in the Last Year: Never true  Transportation Needs: No Transportation Needs   Lack of Transportation (Medical): No   Lack of Transportation (Non-Medical): No  Physical Activity: Insufficiently Active   Days of Exercise per Week: 3 days   Minutes of Exercise per Session: 20 min  Stress: No Stress Concern Present   Feeling of Stress : Not at all  Social Connections: Socially Isolated   Frequency of Communication with Friends and Family: More than three times a week   Frequency of Social Gatherings with Friends and Family: Twice a week   Attends Religious Services: Never   Marine scientist or Organizations: No   Attends Music therapist: Never   Marital Status: Divorced    Tobacco Counseling Counseling given: Not Answered   Clinical Intake:  Pre-visit preparation completed: Yes  Pain : No/denies pain Pain Score: 0-No pain     BMI - recorded: 21.4 Nutritional Status: BMI of 19-24  Normal Nutritional Risks: None Diabetes:  No  How often do you need to have someone help you when you read instructions, pamphlets, or other written materials from your doctor or pharmacy?: 2 - Rarely What is the last grade level you completed in school?: Masters degree  Diabetic?No  Interpreter Needed?: No  Comments: Lives in Assisted Living unit   Activities of Daily Living In your present state of health, do you have any difficulty performing the following activities: 05/03/2021 08/25/2020  Hearing? N N  Vision? N N  Difficulty concentrating or making decisions? N N  Walking or climbing stairs?  Y N  Dressing or bathing? N N  Doing errands, shopping? Y N  Preparing Food and eating ? Y -  Using the Toilet? N -  In the past six months, have you accidently leaked urine? Y -  Do you have problems with loss of bowel control? N -  Managing your Medications? Y -  Managing your Finances? N -  Housekeeping or managing your Housekeeping? Y -  Some recent data might be hidden    Patient Care Team: Virgie Dad, MD as PCP - General (Internal Medicine)  Indicate any recent Medical Services you may have received from other than [facility] providers in the past year (date may be approximate).     Assessment:   This is a routine wellness examination for Solace.  Hearing/Vision screen No results found.  Dietary issues and exercise activities discussed: Current Exercise Habits: The patient does not participate in regular exercise at present, Exercise limited by: orthopedic condition(s);Other - see comments (advanced age)   Goals Addressed             This Visit's Progress    Maintain Mobility and Function   On track    Evidence-based guidance:  Emphasize the importance of physical activity and aerobic exercise as included in treatment plan; assess barriers to adherence; consider patient's abilities and preferences.  Encourage gradual increase in activity or exercise instead of stopping if pain occurs.  Reinforce individual  therapy exercise prescription, such as strengthening, stabilization and stretching programs.  Promote optimal body mechanics to stabilize the spine with lifting and functional activity.  Encourage activity and mobility modifications to facilitate optimal function, such as using a log roll for bed mobility or dressing from a seated position.  Reinforce individual adaptive equipment recommendations to limit excessive spinal movements, such as a Systems analyst.  Assess adequacy of sleep; encourage use of sleep hygiene techniques, such as bedtime routine; use of white noise; dark, cool bedroom; avoiding daytime naps, heavy meals or exercise before bedtime.  Promote positions and modification to optimize sleep and sexual activity; consider pillows or positioning devices to assist in maintaining neutral spine.  Explore options for applying ergonomic principles at work and home, such as frequent position changes, using ergonomically designed equipment and working at optimal height.  Promote modifications to increase comfort with driving such as lumbar support, optimizing seat and steering wheel position, using cruise control and taking frequent rest stops to stretch and walk.   Notes:        Depression Screen PHQ 2/9 Scores 05/03/2021  PHQ - 2 Score 0    Fall Risk Fall Risk  05/03/2021  Falls in the past year? 1  Number falls in past yr: 0  Injury with Fall? 0  Risk for fall due to : History of fall(s);Impaired balance/gait;Impaired mobility  Follow up Falls evaluation completed;Education provided;Falls prevention discussed    FALL RISK PREVENTION PERTAINING TO THE HOME:  Any stairs in or around the home? No  If so, are there any without handrails? No  Home free of loose throw rugs in walkways, pet beds, electrical cords, etc? Yes  Adequate lighting in your home to reduce risk of falls? Yes   ASSISTIVE DEVICES UTILIZED TO PREVENT FALLS:  Life alert? Yes  Use of a cane, walker or w/c?  Yes  Grab bars in the bathroom? Yes  Shower chair or bench in shower? Yes  Elevated toilet seat or a handicapped toilet? Yes   TIMED UP AND GO:  Was  the test performed? No .  Length of time to ambulate 10 feet:  sec.   Gait slow and steady with assistive device  Cognitive Function: MMSE - Mini Mental State Exam 05/03/2021  Not completed: Refused     6CIT Screen 05/03/2021  What Year? 0 points  What month? 0 points  What time? 3 points  Count back from 20 0 points  Months in reverse 0 points  Repeat phrase 0 points  Total Score 3    Immunizations Immunization History  Administered Date(s) Administered   Fluad Quad(high Dose 65+) 05/28/2019   Influenza Split 07/12/2016, 08/13/2017, 06/02/2019, 09/09/2020   Influenza, High Dose Seasonal PF 06/11/2017, 06/28/2018   Influenza-Unspecified 06/21/2020   Moderna SARS-COV2 Booster Vaccination 03/08/2021   Moderna Sars-Covid-2 Vaccination 10/05/2019, 11/02/2019, 08/15/2020   Pneumococcal Conjugate-13 11/22/2014   Pneumococcal Polysaccharide-23 04/29/2009, 08/13/2017   Td 01/24/2013   Tdap 12/28/2017, 07/01/2018, 12/13/2020   Zoster Recombinat (Shingrix) 07/01/2018   Zoster, Live 11/12/2009, 07/01/2018    TDAP status: Up to date  Flu Vaccine status: Up to date  Pneumococcal vaccine status: Up to date  Covid-19 vaccine status: Completed vaccines  Qualifies for Shingles Vaccine? Yes   Zostavax completed Yes   Shingrix Completed?: Yes  Screening Tests Health Maintenance  Topic Date Due   Zoster Vaccines- Shingrix (2 of 2) 08/26/2018   INFLUENZA VACCINE  05/01/2021   COVID-19 Vaccine (5 - Booster for Moderna series) 07/08/2021   TETANUS/TDAP  12/14/2030   DEXA SCAN  Completed   PNA vac Low Risk Adult  Completed   HPV VACCINES  Aged Out    Health Maintenance  Health Maintenance Due  Topic Date Due   Zoster Vaccines- Shingrix (2 of 2) 08/26/2018   INFLUENZA VACCINE  05/01/2021    Colorectal cancer screening: No  longer required.   Mammogram status: No longer required due to advanced age.  Bone Density status: Completed 2021. Results reflect: Bone density results: OSTEOPOROSIS. Repeat every 2 years.  Lung Cancer Screening: (Low Dose CT Chest recommended if Age 60-80 years, 30 pack-year currently smoking OR have quit w/in 15years.) does not qualify.   Lung Cancer Screening Referral: No  Additional Screening:  Hepatitis C Screening: does not qualify; Completed   Vision Screening: Recommended annual ophthalmology exams for early detection of glaucoma and other disorders of the eye. Is the patient up to date with their annual eye exam?  Yes  Who is the provider or what is the name of the office in which the patient attends annual eye exams? N/A If pt is not established with a provider, would they like to be referred to a provider to establish care? No .   Dental Screening: Recommended annual dental exams for proper oral hygiene  Community Resource Referral / Chronic Care Management: CRR required this visit?  No   CCM required this visit?  No      Plan:     I have personally reviewed and noted the following in the patient's chart:   Medical and social history Use of alcohol, tobacco or illicit drugs  Current medications and supplements including opioid prescriptions.  Functional ability and status Nutritional status Physical activity Advanced directives List of other physicians Hospitalizations, surgeries, and ER visits in previous 12 months Vitals Screenings to include cognitive, depression, and falls Referrals and appointments  In addition, I have reviewed and discussed with patient certain preventive protocols, quality metrics, and best practice recommendations. A written personalized care plan for preventive services as well as  general preventive health recommendations were provided to patient.     Yvonna Alanis, NP   05/03/2021

## 2021-05-03 NOTE — Patient Instructions (Signed)
  Ms. Gassert , Thank you for taking time to come for your Medicare Wellness Visit. I appreciate your ongoing commitment to your health goals. Please review the following plan we discussed and let me know if I can assist you in the future.   These are the goals we discussed:  Goals      Maintain Mobility and Function     Evidence-based guidance:  Emphasize the importance of physical activity and aerobic exercise as included in treatment plan; assess barriers to adherence; consider patient's abilities and preferences.  Encourage gradual increase in activity or exercise instead of stopping if pain occurs.  Reinforce individual therapy exercise prescription, such as strengthening, stabilization and stretching programs.  Promote optimal body mechanics to stabilize the spine with lifting and functional activity.  Encourage activity and mobility modifications to facilitate optimal function, such as using a log roll for bed mobility or dressing from a seated position.  Reinforce individual adaptive equipment recommendations to limit excessive spinal movements, such as a Systems analyst.  Assess adequacy of sleep; encourage use of sleep hygiene techniques, such as bedtime routine; use of white noise; dark, cool bedroom; avoiding daytime naps, heavy meals or exercise before bedtime.  Promote positions and modification to optimize sleep and sexual activity; consider pillows or positioning devices to assist in maintaining neutral spine.  Explore options for applying ergonomic principles at work and home, such as frequent position changes, using ergonomically designed equipment and working at optimal height.  Promote modifications to increase comfort with driving such as lumbar support, optimizing seat and steering wheel position, using cruise control and taking frequent rest stops to stretch and walk.   Notes:         This is a list of the screening recommended for you and due dates:  Health  Maintenance  Topic Date Due   Zoster (Shingles) Vaccine (2 of 2) 08/26/2018   Flu Shot  05/01/2021   COVID-19 Vaccine (5 - Booster for Moderna series) 07/08/2021   Tetanus Vaccine  12/14/2030   DEXA scan (bone density measurement)  Completed   Pneumonia vaccines  Completed   HPV Vaccine  Aged Out

## 2021-05-03 NOTE — Progress Notes (Signed)
Provider:  Windell Moulding, NP Location:  [place] of Service:      PCP: Virgie Dad, MD Patient Care Team: Virgie Dad, MD as PCP - General (Internal Medicine)  Extended Emergency Contact Information Primary Emergency Contact: Genia Plants Address: [address]          [place], [place] [address] Johnnette Litter of [place] Phone: [phone] Relation: Nephew Secondary Emergency Contact: [name] Address: [address]          [place], [place] [address] Johnnette Litter of [place] Phone: [phone] Mobile Phone: [phone] Relation: Nephew  Code Status: FULL CODE Goals of Care: Advanced Directive information Advanced Directives 05/03/2021  Does Patient Have a Medical Advance Directive? Yes  Type of Paramedic of [place];Living will  Does patient want to make changes to medical advance directive? No - Patient declined  Copy of [place] in Chart? Yes - validated most recent copy scanned in chart (See row information)  Would patient like information on creating a medical advance directive? -     Chief Complaint  Patient presents with   Medicare Wellness    Medicare annual wellness    HPI: Patient is a 87 y.o. female seen today for an annual comprehensive examination.  Past Medical History:  Diagnosis Date   Cancer [facility])    non hodgkin lymphoma   Chronic lymphocytic leukemia (HCC)    History of B-cell lymphoma 11/02/2015   Migraines    Osteoporosis    Thyroid disease    Trigeminal neuralgia of left side of face    Past Surgical History:  Procedure Laterality Date   gamma knife     for trigeminal neuralgia   SHOULDER SURGERY     Right shoulder replacement   TONSILLECTOMY AND ADENOIDECTOMY     WISDOM TOOTH EXTRACTION      reports that she quit smoking about 45 years ago. Her smoking use included cigarettes. She has a 10.00 pack-year smoking history. She has never used smokeless tobacco.  She reports current alcohol use of about 2.0 standard drinks of alcohol per week. She reports that she does not use drugs. Social History   Socioeconomic History   Marital status: Divorced    Spouse name: Not on file   Number of children: 0   Years of education: Masters   Highest education level: Not on file  Occupational History   Occupation: Retired  Tobacco Use   Smoking status: Former    Packs/day: 0.50    Years: 20.00    Pack years: 10.00    Types: Cigarettes    Quit date: 10/02/1975    Years since quitting: 45.6   Smokeless tobacco: Never  Vaping Use   Vaping Use: Never used  Substance and Sexual Activity   Alcohol use: Yes    Alcohol/week: 2.0 standard drinks    Types: 2 Glasses of wine per week   Drug use: No   Sexual activity: Never    Comment: divorced, no children, sister in law next of kin. Retired Actuary for [employer]  Other Topics Concern   Not on file  Social History Narrative   Lives at home alone.   Right-handed.   4 cups caffeine per day.   Social Determinants of Health   Financial Resource Strain: Not on file  Food Insecurity: Not on file  Transportation Needs: Not on file  Physical Activity: Not on file  Stress: Not on file  Social Connections: Not on file  Intimate Partner Violence: Not on file   Family History  Problem Relation Age of Onset   Cancer Father        unknown ca   Pneumonia Father    Cancer Brother        prostate ca   Stroke Brother    Heart attack Mother     Pertinent  Health Maintenance Due  Topic Date Due   INFLUENZA VACCINE  05/01/2021   DEXA SCAN  Completed   PNA vac Low Risk Adult  Completed   No flowsheet data found. No flowsheet data found.  Functional Status Survey:    Allergies  Allergen Reactions   Penicillins Hives   Carbamazepine Other (See Comments) and Hives    Turned purple from neck down and drowsy Other reaction(s): Other (See Comments) Turned purple from neck down and drowsy    Pregabalin Other (See Comments)    Drowsy Other reaction(s): Confusion (intolerance) Drowsy    Allergies as of 05/03/2021       Reactions   Penicillins Hives   Carbamazepine Other (See Comments), Hives   Turned purple from neck down and drowsy Other reaction(s): Other (See Comments) Turned purple from neck down and drowsy   Pregabalin Other (See Comments)   Drowsy Other reaction(s): Confusion (intolerance) Drowsy        Medication List        Accurate as of May 03, 2021 10:30 AM. If you have any questions, ask your nurse or doctor.          STOP taking these medications    Ukoniq 200 MG Tabs Generic drug: Umbralisib Tosylate Stopped by: Yvonna Alanis, NP       TAKE these medications    acetaminophen 500 MG tablet Commonly known as: TYLENOL Take 500 mg by mouth every 6 (six) hours.   aspirin 81 MG chewable tablet Chew 81 mg by mouth daily.   famciclovir 125 MG tablet Commonly known as: FAMVIR TAKE 2 TABLETS (250MG ) BY MOUTH DAILY   gabapentin 800 MG tablet Commonly known as: NEURONTIN Take 800 mg by mouth 3 (three) times daily.   lactose free nutrition Liqd Take 237 mLs by mouth daily.   levothyroxine 100 MCG tablet Commonly known as: SYNTHROID Take 100 mcg by mouth daily before breakfast. What changed: Another medication with the same name was removed. Continue taking this medication, and follow the directions you see here. Changed by: Yvonna Alanis, NP   methocarbamol 500 MG tablet Commonly known as: ROBAXIN Take 500 mg by mouth daily.   Multi-Vitamins Tabs Take 1 tablet by mouth daily.   polyvinyl alcohol 1.4 % ophthalmic solution Commonly known as: LIQUIFILM TEARS Place 1 drop into both eyes 4 (four) times daily.   Pro-Stat Liqd Take 30 mLs by mouth daily.   sulfamethoxazole-trimethoprim 800-160 MG tablet Commonly known as: BACTRIM DS Take 1 tablet by mouth daily.   torsemide 10 MG tablet Commonly known as: DEMADEX 10 mg daily as  needed.   white petrolatum ointment Apply topically 2 (two) times daily.   zinc oxide 20 % ointment Apply 1 application topically as needed for irritation. To buttocks after every incontinent episode and as needed for redness. May keep at bedside.        Review of Systems  Vitals:   05/03/21 1018  BP: 104/62  Pulse: 92  Resp: 18  Temp: (!) 97 F (36.1 C)  SpO2: 98%  Weight: 132 lb 9.6  oz (60.1 kg)  Height: 5\' 6"  (1.676 m)   Body mass index is 21.4 kg/m. Physical Exam  Labs reviewed: Basic Metabolic Panel: Recent Labs    10/14/20 1042 11/04/20 0000 11/10/20 0000 12/13/20 1020 04/17/21 1309  NA 144   < > 137 138 137  K 4.2   < > 4.1 4.6 4.5  CL 105   < > 104 100 104  CO2 31   < > 25* 29 25  GLUCOSE 131*  --   --  101* 85  BUN 31*   < > 16 41* 41*  CREATININE 1.21*   < > 1.3* 1.60* 1.77*  CALCIUM 9.3   < > 8.0* 9.7 8.7*   < > = values in this interval not displayed.   Liver Function Tests: Recent Labs    10/14/20 1042 11/04/20 0000 12/13/20 1020 04/17/21 1309  AST 20 19 20 21   ALT 8 9 8 10   ALKPHOS 72 51 49 80  BILITOT 0.4  --  0.2* 0.2*  PROT 6.6  --  6.7 6.1*  ALBUMIN 2.3* 1.8* 2.6* 2.4*   No results for input(s): LIPASE, AMYLASE in the last 8760 hours. No results for input(s): AMMONIA in the last 8760 hours. CBC: Recent Labs    10/14/20 1042 11/04/20 0000 12/13/20 1020 04/17/21 1309  WBC 55.9* 74.0 92.6* 30.5*  NEUTROABS 3.9 2,886.00 5.0 4.7  HGB 9.4* 8.6* 10.6* 9.4*  HCT 30.2* 27* 33.8* 29.2*  MCV 109.4*  --  108.0* 111.0*  PLT 213 201 234 226   Cardiac Enzymes: No results for input(s): CKTOTAL, CKMB, CKMBINDEX, TROPONINI in the last 8760 hours. BNP: Invalid input(s): POCBNP No results found for: HGBA1C Lab Results  Component Value Date   TSH 27.99 (A) 09/08/2020   No results found for: VITAMINB12 No results found for: FOLATE Lab Results  Component Value Date   IRON 48 04/17/2021   TIBC 209 (L) 04/17/2021   FERRITIN 65  04/17/2021    Imaging and Procedures obtained recently: No results found.  Assessment/Plan 1. Encounter for Medicare annual wellness exam     Family/ staff Communication:   Labs/tests ordered:

## 2021-05-11 ENCOUNTER — Other Ambulatory Visit: Payer: Self-pay | Admitting: *Deleted

## 2021-05-11 ENCOUNTER — Telehealth: Payer: Self-pay | Admitting: *Deleted

## 2021-05-11 ENCOUNTER — Other Ambulatory Visit (HOSPITAL_COMMUNITY): Payer: Self-pay

## 2021-05-11 MED ORDER — CALQUENCE 100 MG PO CAPS
100.0000 mg | ORAL_CAPSULE | Freq: Every day | ORAL | 3 refills | Status: DC
  Filled 2021-05-11: qty 30, 30d supply, fill #0

## 2021-05-11 NOTE — Telephone Encounter (Signed)
Recd a call from patients nephew who was checking up on his aunt.  Aunt said she was not taking Ukoniq as production had stopped.  Were going to switch to Calquence.  Dr Marin Olp sent Rx to [facility] outpatient pharmacy.  Told Will we will hear from them when order is received as to cost, instructions etc.  Will appreciates the call.

## 2021-05-12 ENCOUNTER — Other Ambulatory Visit (HOSPITAL_COMMUNITY): Payer: Self-pay

## 2021-05-17 ENCOUNTER — Other Ambulatory Visit (HOSPITAL_COMMUNITY): Payer: Self-pay

## 2021-05-17 ENCOUNTER — Telehealth: Payer: Self-pay | Admitting: Pharmacy Technician

## 2021-05-17 NOTE — Telephone Encounter (Signed)
Oral Oncology Patient Advocate Encounter   Received notification from [employer] that prior authorization for Calquence is required.   PA submitted on CoverMyMeds Key [id number]  Status is pending   Oral Oncology Clinic will continue to follow.  [place] Patient [place] Phone [phone] Fax [phone] 05/19/2021 3:01 PM

## 2021-05-19 ENCOUNTER — Non-Acute Institutional Stay: Payer: Medicare Other | Admitting: Orthopedic Surgery

## 2021-05-19 ENCOUNTER — Other Ambulatory Visit (HOSPITAL_COMMUNITY): Payer: Self-pay

## 2021-05-19 ENCOUNTER — Encounter: Payer: Self-pay | Admitting: Orthopedic Surgery

## 2021-05-19 DIAGNOSIS — E039 Hypothyroidism, unspecified: Secondary | ICD-10-CM | POA: Diagnosis not present

## 2021-05-19 DIAGNOSIS — R5383 Other fatigue: Secondary | ICD-10-CM

## 2021-05-19 MED ORDER — VITAMIN B-12 1000 MCG PO TABS: 1000.0000 ug | ORAL_TABLET | Freq: Every day | ORAL | 1 refills | Status: AC

## 2021-05-19 NOTE — Progress Notes (Signed)
Location:   [place] Room Number: [place] of Service:  ALF [phone]) Provider: Windell Moulding, NP    Patient Care Team: Joy Dad, MD as PCP - General (Internal Medicine)  Extended Emergency Contact Information Primary Emergency Contact: Joy Lynch Address: [address]          [place], [place] [address] Joy Lynch of [place] Phone: [phone] Relation: Joy Lynch Secondary Emergency Contact: Joy Lynch Address: [address], [place] [address] Joy Lynch of [place] Phone: [phone] Mobile Phone: [phone] Relation: Nephew  Code Status:  FULL CODE Goals of care: Advanced Directive information Advanced Directives 05/19/2021  Does Patient Have a Medical Advance Directive? Yes  Type of Paramedic of [place];Living will  Does patient want to make changes to medical advance directive? No - Patient declined  Copy of [place] in Chart? Yes - validated most recent copy scanned in chart (See row information)  Would patient like information on creating a medical advance directive? -     Chief Complaint  Patient presents with   Acute Visit    Patient presents for elevated TSH    HPI:  Pt is a 87 y.o. female seen today for an acute visit for elevated TSH.   She currently resides in assisted living at [facility]. Past medical history includes: hypertension, migraines, hypothyroidism, cerebellar infarct, trigeminal neuralgia, lymphoma, CKD stage III, anemia of neoplastic disease, and gait abnormality.   TSH 15.41 05/18/2021. She currently takes levothyroxine 100 mcg daily. She reports some fatigue but unsure if it is related to advanced age or warm weather. She continues to be very active and leaves campus often with friends. She denies chest pain and sob.   Recent weights:  08/01- 132.6 lbs  07/01- 124.6 lbs  06/01- 124 lbs  Nurse does not report any other concerns,  vitals stable.    Past Medical History:  Diagnosis Date   Cancer [facility])    non hodgkin lymphoma   Chronic lymphocytic leukemia (HCC)    History of B-cell lymphoma 11/02/2015   Migraines    Osteoporosis    Thyroid disease    Trigeminal neuralgia of left side of face    Past Surgical History:  Procedure Laterality Date   gamma knife     for trigeminal neuralgia   SHOULDER SURGERY     Right shoulder replacement   TONSILLECTOMY AND ADENOIDECTOMY     WISDOM TOOTH EXTRACTION      Allergies  Allergen Reactions   Penicillins Hives   Carbamazepine Other (See Comments) and Hives    Turned purple from neck down and drowsy Other reaction(s): Other (See Comments) Turned purple from neck down and drowsy   Pregabalin Other (See Comments)    Drowsy Other reaction(s): Confusion (intolerance) Drowsy    Allergies as of 05/19/2021       Reactions   Penicillins Hives   Carbamazepine Other (See Comments), Hives   Turned purple from neck down and drowsy Other reaction(s): Other (See Comments) Turned purple from neck down and drowsy   Pregabalin Other (See Comments)   Drowsy Other reaction(s): Confusion (intolerance) Drowsy        Medication List        Accurate as of May 19, 2021 10:36 AM. If you have any questions, ask your nurse or doctor.          STOP taking these medications  Calquence 100 MG capsule Generic drug: acalabrutinib Stopped by: Yvonna Alanis, NP       TAKE these medications    acetaminophen 500 MG tablet Commonly known as: TYLENOL Take 500 mg by mouth every 6 (six) hours.   aspirin 81 MG chewable tablet Chew 81 mg by mouth daily.   famciclovir 125 MG tablet Commonly known as: FAMVIR TAKE 2 TABLETS (250MG ) BY MOUTH DAILY   gabapentin 800 MG tablet Commonly known as: NEURONTIN Take 800 mg by mouth 3 (three) times daily.   lactose free nutrition Liqd Take 237 mLs by mouth daily.   levothyroxine 100 MCG tablet Commonly known as:  SYNTHROID Take 100 mcg by mouth daily before breakfast.   methocarbamol 500 MG tablet Commonly known as: ROBAXIN Take 500 mg by mouth daily.   Multi-Vitamins Tabs Take 1 tablet by mouth daily.   polyvinyl alcohol 1.4 % ophthalmic solution Commonly known as: LIQUIFILM TEARS Place 1 drop into both eyes 4 (four) times daily.   Pro-Stat Liqd Take 30 mLs by mouth daily.   sulfamethoxazole-trimethoprim 800-160 MG tablet Commonly known as: BACTRIM DS Take 1 tablet by mouth daily.   torsemide 10 MG tablet Commonly known as: DEMADEX 10 mg daily as needed.   white petrolatum ointment Apply topically 2 (two) times daily.   zinc oxide 20 % ointment Apply 1 application topically as needed for irritation. To buttocks after every incontinent episode and as needed for redness. May keep at bedside.        Review of Systems  Constitutional:  Positive for fatigue and unexpected weight change. Negative for activity change and appetite change.  Respiratory:  Negative for cough, shortness of breath and wheezing.   Cardiovascular:  Negative for chest pain and leg swelling.  Psychiatric/Behavioral:  Negative for confusion and dysphoric mood. The patient is not nervous/anxious.    Immunization History  Administered Date(s) Administered   Fluad Quad(high Dose 65+) 05/28/2019   Influenza Split 07/12/2016, 08/13/2017, 06/02/2019, 09/09/2020   Influenza, High Dose Seasonal PF 06/11/2017, 06/28/2018   Influenza-Unspecified 06/21/2020   Moderna SARS-COV2 Booster Vaccination 03/08/2021   Moderna Sars-Covid-2 Vaccination 10/05/2019, 11/02/2019, 08/15/2020   Pneumococcal Conjugate-13 11/22/2014   Pneumococcal Polysaccharide-23 04/29/2009, 08/13/2017   Td 01/24/2013   Tdap 12/28/2017, 07/01/2018, 12/13/2020   Zoster Recombinat (Shingrix) 07/01/2018   Zoster, Live 11/12/2009, 07/01/2018   Pertinent  Health Maintenance Due  Topic Date Due   INFLUENZA VACCINE  05/01/2021   DEXA SCAN  Completed    PNA vac Low Risk Adult  Completed   Fall Risk  05/03/2021  Falls in the past year? 1  Number falls in past yr: 0  Injury with Fall? 0  Risk for fall due to : History of fall(s);Impaired balance/gait;Impaired mobility  Follow up Falls evaluation completed;Education provided;Falls prevention discussed   Functional Status Survey:    Vitals:   05/19/21 1031  BP: 120/63  Pulse: 94  Resp: 19  Temp: (!) 97.3 F (36.3 C)  SpO2: 91%  Weight: 132 lb 9.6 oz (60.1 kg)  Height: 5\' 6"  (1.676 m)   Body mass index is 21.4 kg/m. Physical Exam Vitals reviewed.  Constitutional:      General: She is not in acute distress. HENT:     Head: Normocephalic.  Cardiovascular:     Rate and Rhythm: Normal rate and regular rhythm.     Pulses: Normal pulses.     Heart sounds: Normal heart sounds.  Pulmonary:     Effort: Pulmonary effort is  normal. No respiratory distress.     Breath sounds: Normal breath sounds. No wheezing.  Abdominal:     General: Abdomen is flat. Bowel sounds are normal.     Palpations: Abdomen is soft.  Skin:    General: Skin is warm and dry.     Capillary Refill: Capillary refill takes less than 2 seconds.  Neurological:     General: No focal deficit present.     Mental Status: She is alert and oriented to person, place, and time.  Psychiatric:        Mood and Affect: Mood normal.        Behavior: Behavior normal.    Labs reviewed: Recent Labs    10/14/20 1042 11/04/20 0000 11/10/20 0000 12/13/20 1020 04/17/21 1309  NA 144   < > 137 138 137  K 4.2   < > 4.1 4.6 4.5  CL 105   < > 104 100 104  CO2 31   < > 25* 29 25  GLUCOSE 131*  --   --  101* 85  BUN 31*   < > 16 41* 41*  CREATININE 1.21*   < > 1.3* 1.60* 1.77*  CALCIUM 9.3   < > 8.0* 9.7 8.7*   < > = values in this interval not displayed.   Recent Labs    10/14/20 1042 11/04/20 0000 12/13/20 1020 04/17/21 1309  AST 20 19 20 21   ALT 8 9 8 10   ALKPHOS 72 51 49 80  BILITOT 0.4  --  0.2* 0.2*  PROT  6.6  --  6.7 6.1*  ALBUMIN 2.3* 1.8* 2.6* 2.4*   Recent Labs    10/14/20 1042 11/04/20 0000 12/13/20 1020 04/17/21 1309  WBC 55.9* 74.0 92.6* 30.5*  NEUTROABS 3.9 2,886.00 5.0 4.7  HGB 9.4* 8.6* 10.6* 9.4*  HCT 30.2* 27* 33.8* 29.2*  MCV 109.4*  --  108.0* 111.0*  PLT 213 201 234 226   Lab Results  Component Value Date   TSH 27.99 (A) 09/08/2020   No results found for: HGBA1C No results found for: CHOL, HDL, LDLCALC, LDLDIRECT, TRIG, CHOLHDL  Significant Diagnostic Results in last 30 days:  No results found.  Assessment/Plan 1. Fatigue, unspecified type - TSH 15.41 05/18/2021 - 8 lbs weight gain within past month - she confirms some fatigue - start vitamin B12 1000 mcg po daily  2. Acquired hypothyroidism - see above - suspect she is not receiving her levothyroxine on empty stomach for 1 hour - Will change time for levothyroxine 100 mcg to 7 am - repeat TSH in 6 weeks    Family/ staff Communication: plan discussed with patient and nurse  Labs/tests ordered:  TSH in 6 weeks

## 2021-05-19 NOTE — Telephone Encounter (Signed)
Oral Oncology Patient Advocate Encounter  Prior Authorization for Calquence has been approved.    PA# [id number] Effective dates: 05/19/21 through 09/30/21  Patients co-pay is $773.60  Oral Oncology Clinic will continue to follow.   [place] Patient [place] Phone [phone] Fax [phone] 05/19/2021 3:35 PM

## 2021-05-22 ENCOUNTER — Telehealth: Payer: Self-pay | Admitting: Pharmacist

## 2021-05-22 ENCOUNTER — Encounter: Payer: Self-pay | Admitting: Hematology & Oncology

## 2021-05-22 ENCOUNTER — Other Ambulatory Visit (HOSPITAL_COMMUNITY): Payer: Self-pay

## 2021-05-22 DIAGNOSIS — C8588 Other specified types of non-Hodgkin lymphoma, lymph nodes of multiple sites: Secondary | ICD-10-CM

## 2021-05-22 MED ORDER — CALQUENCE 100 MG PO CAPS
100.0000 mg | ORAL_CAPSULE | Freq: Every day | ORAL | 3 refills | Status: DC
  Filled 2021-05-22 (×2): qty 30, 30d supply, fill #0

## 2021-05-22 NOTE — Telephone Encounter (Signed)
Oral Chemotherapy Pharmacist Encounter  Patient Education I spoke with patient's nephew Gwyndolyn Saxon for overview of new oral chemotherapy medication: Calquence (acalabrutinib) for the treatment of marginal zone lymphoma, planned duration until disease progression or unacceptable drug toxicity.  Counseled William on administration, dosing, side effects, monitoring, drug-food interactions, safe handling, storage, and disposal. Patient will take 1 capsule (100 mg total) by mouth daily.  Side effects include but not limited to: heachache, diarrhea, decreased wbc/hgb/plt.    After discussion with Gwyndolyn Saxon no patient barriers to medication adherence identified.   Gwyndolyn Saxon voiced understanding and appreciation. All questions answered.   Provided Gwyndolyn Saxon with Oral [place] [facility] phone number. Gwyndolyn Saxon knows to call the office with questions or concerns. Oral Chemotherapy Navigation Clinic will continue to follow.  Darl Pikes, PharmD, BCPS, BCOP, CPP Hematology/Oncology Clinical Pharmacist Practitioner [facility]/AP [facility] [phone]  05/22/2021 4:21 PM

## 2021-05-22 NOTE — Telephone Encounter (Signed)
Oral Oncology Pharmacist Encounter  Received new prescription for Calquence (acalabrutinib) for the treatment of marginal zone lymphoma, planned duration until disease progression or unacceptable drug toxicity.  Prescription dose and frequency assessed.   Current medication list in Epic reviewed, one DDIs with acalabrutinib identified: -Acalabrutinib: Acalabrutinib may enhance the antiplatelet effect of agents with antiplatelet properties like aspirin, monitor CBC and for s/sx of bleeding.  Evaluated chart and no patient barriers to medication adherence identified.   Prescription has been e-scribed to the [facility] for benefits analysis and approval.  Oral Oncology Clinic will continue to follow for insurance authorization, copayment issues, initial counseling and start date.   Darl Pikes, PharmD, BCPS, BCOP, CPP Hematology/Oncology Clinical Pharmacist Practitioner [facility] [phone]  05/22/2021 1:51 PM

## 2021-05-23 ENCOUNTER — Other Ambulatory Visit: Payer: Self-pay | Admitting: *Deleted

## 2021-05-23 DIAGNOSIS — C8588 Other specified types of non-Hodgkin lymphoma, lymph nodes of multiple sites: Secondary | ICD-10-CM

## 2021-05-23 MED ORDER — CALQUENCE 100 MG PO CAPS: 100.0000 mg | ORAL_CAPSULE | Freq: Every day | ORAL | 3 refills | Status: DC

## 2021-05-24 ENCOUNTER — Encounter: Payer: Self-pay | Admitting: *Deleted

## 2021-05-24 ENCOUNTER — Other Ambulatory Visit (HOSPITAL_COMMUNITY): Payer: Self-pay

## 2021-05-24 NOTE — Progress Notes (Signed)
Signed prescription from Dr. Marin Olp for [facility] faxed to [facility] at [phone] per request of [facility].

## 2021-05-26 ENCOUNTER — Telehealth: Payer: Self-pay | Admitting: Pharmacy Technician

## 2021-05-29 ENCOUNTER — Inpatient Hospital Stay: Payer: Medicare Other | Attending: Hematology & Oncology

## 2021-05-29 ENCOUNTER — Inpatient Hospital Stay (HOSPITAL_BASED_OUTPATIENT_CLINIC_OR_DEPARTMENT_OTHER): Payer: Medicare Other | Admitting: Family

## 2021-05-29 ENCOUNTER — Encounter: Payer: Self-pay | Admitting: Family

## 2021-05-29 ENCOUNTER — Other Ambulatory Visit: Payer: Self-pay

## 2021-05-29 ENCOUNTER — Telehealth: Payer: Self-pay

## 2021-05-29 ENCOUNTER — Other Ambulatory Visit: Payer: Self-pay | Admitting: *Deleted

## 2021-05-29 VITALS — BP 87/42 | HR 106 | Temp 97.7°F | Resp 18 | Ht 66.0 in | Wt 132.1 lb

## 2021-05-29 DIAGNOSIS — C8590 Non-Hodgkin lymphoma, unspecified, unspecified site: Secondary | ICD-10-CM | POA: Insufficient documentation

## 2021-05-29 DIAGNOSIS — D61818 Other pancytopenia: Secondary | ICD-10-CM | POA: Diagnosis not present

## 2021-05-29 DIAGNOSIS — D509 Iron deficiency anemia, unspecified: Secondary | ICD-10-CM

## 2021-05-29 DIAGNOSIS — C8588 Other specified types of non-Hodgkin lymphoma, lymph nodes of multiple sites: Secondary | ICD-10-CM | POA: Diagnosis not present

## 2021-05-29 DIAGNOSIS — D649 Anemia, unspecified: Secondary | ICD-10-CM | POA: Insufficient documentation

## 2021-05-29 LAB — CBC WITH DIFFERENTIAL (CANCER CENTER ONLY)
Abs Immature Granulocytes: 0 10*3/uL (ref 0.00–0.07)
Basophils Absolute: 0.5 10*3/uL — ABNORMAL HIGH (ref 0.0–0.1)
Basophils Relative: 1 %
Eosinophils Absolute: 0.5 10*3/uL (ref 0.0–0.5)
Eosinophils Relative: 1 %
HCT: 25.5 % — ABNORMAL LOW (ref 36.0–46.0)
Hemoglobin: 8.2 g/dL — ABNORMAL LOW (ref 12.0–15.0)
Lymphocytes Relative: 80 %
Lymphs Abs: 39.9 10*3/uL — ABNORMAL HIGH (ref 0.7–4.0)
MCH: 35.7 pg — ABNORMAL HIGH (ref 26.0–34.0)
MCHC: 32.2 g/dL (ref 30.0–36.0)
MCV: 110.9 fL — ABNORMAL HIGH (ref 80.0–100.0)
Monocytes Absolute: 0.5 10*3/uL (ref 0.1–1.0)
Monocytes Relative: 1 %
Neutro Abs: 8.5 10*3/uL — ABNORMAL HIGH (ref 1.7–7.7)
Neutrophils Relative %: 17 %
Platelet Count: 161 10*3/uL (ref 150–400)
RBC: 2.3 MIL/uL — ABNORMAL LOW (ref 3.87–5.11)
RDW: 14.6 % (ref 11.5–15.5)
WBC Count: 49.9 10*3/uL — ABNORMAL HIGH (ref 4.0–10.5)
nRBC: 0 % (ref 0.0–0.2)

## 2021-05-29 LAB — LACTATE DEHYDROGENASE: LDH: 263 U/L — ABNORMAL HIGH (ref 98–192)

## 2021-05-29 LAB — CMP (CANCER CENTER ONLY)
ALT: 10 U/L (ref 0–44)
AST: 20 U/L (ref 15–41)
Albumin: 2.4 g/dL — ABNORMAL LOW (ref 3.5–5.0)
Alkaline Phosphatase: 59 U/L (ref 38–126)
Anion gap: 10 (ref 5–15)
BUN: 39 mg/dL — ABNORMAL HIGH (ref 8–23)
CO2: 22 mmol/L (ref 22–32)
Calcium: 8.5 mg/dL — ABNORMAL LOW (ref 8.9–10.3)
Chloride: 105 mmol/L (ref 98–111)
Creatinine: 2.42 mg/dL — ABNORMAL HIGH (ref 0.44–1.00)
GFR, Estimated: 18 mL/min — ABNORMAL LOW (ref >60)
Glucose, Bld: 142 mg/dL — ABNORMAL HIGH (ref 70–99)
Potassium: 4 mmol/L (ref 3.5–5.1)
Sodium: 137 mmol/L (ref 135–145)
Total Bilirubin: 0.3 mg/dL (ref 0.3–1.2)
Total Protein: 5.9 g/dL — ABNORMAL LOW (ref 6.5–8.1)

## 2021-05-29 LAB — RETICULOCYTES
Immature Retic Fract: 16.5 % — ABNORMAL HIGH (ref 2.3–15.9)
RBC.: 2.3 MIL/uL — ABNORMAL LOW (ref 3.87–5.11)
Retic Count, Absolute: 61.2 10*3/uL (ref 19.0–186.0)
Retic Ct Pct: 2.7 % (ref 0.4–3.1)

## 2021-05-29 LAB — ABO/RH: ABO/RH(D): B POS

## 2021-05-29 LAB — PREPARE RBC (CROSSMATCH)

## 2021-05-29 NOTE — Progress Notes (Addendum)
Hematology and Oncology Follow Up Visit  Joy Lynch [id number] May 29, 1926 87 y.o. 05/29/2021   Principle Diagnosis:  Marginal Zone lymphoma -- relapsed  Past Therapy: Umbralisib 400 mg po q day, started on 10/22/2020 - discontinued by manufacturer    Current Therapy:        Calquence 100 mg PO daily, started 05/25/2021   Interim History:  Joy Lynch is here today with Joy Lynch for follow-up. She is doing fairly well but notes fatigue.  She started Calquence 4 days ago. She had been off of therapy for a little over a month at that point.  WBC count is 49.9, Hgb 8.2, MCV 110 and platelets 161.  IgM last month was 1,661 mg/dL and beta 2 microglobulin 6.5.  Reticulocyte count and erythropoietin level added to today's lab work.  No adenopathy noted on exam.  No blood loss noted. No abnormal bruising or petechiae.  No fever, chills, n/v, cough, rash, dizziness, chest pain, palpitations, abdominal pain or changes in bowel or bladder habits.  No swelling, tenderness, numbness or tingling in Joy extremities.  No falls or syncope.  She has maintained a good appetite and is staying well hydrated.  Joy weight is stable at 132 lbs.   ECOG Performance Status: 2 - Symptomatic, <50% confined to bed  Medications:  Allergies as of 05/29/2021       Reactions   Penicillins Hives   Carbamazepine Other (See Comments), Hives   Turned purple from neck down and drowsy Other reaction(s): Other (See Comments) Turned purple from neck down and drowsy   Pregabalin Other (See Comments)   Drowsy Other reaction(s): Confusion (intolerance) Drowsy        Medication List        Accurate as of May 29, 2021  2:21 PM. If you have any questions, ask your nurse or doctor.          acetaminophen 500 MG tablet Commonly known as: TYLENOL Take 500 mg by mouth every 6 (six) hours.   aspirin 81 MG chewable tablet Chew 81 mg by mouth daily.   Calquence 100 MG capsule Generic drug:  acalabrutinib Take 1 capsule (100 mg total) by mouth daily.   famciclovir 125 MG tablet Commonly known as: FAMVIR TAKE 2 TABLETS (250MG ) BY MOUTH DAILY   gabapentin 800 MG tablet Commonly known as: NEURONTIN Take 800 mg by mouth 3 (three) times daily.   lactose free nutrition Liqd Take 237 mLs by mouth daily.   levothyroxine 100 MCG tablet Commonly known as: SYNTHROID Take 100 mcg by mouth daily before breakfast.   methocarbamol 500 MG tablet Commonly known as: ROBAXIN Take 500 mg by mouth daily.   Multi-Vitamins Tabs Take 1 tablet by mouth daily.   polyvinyl alcohol 1.4 % ophthalmic solution Commonly known as: LIQUIFILM TEARS Place 1 drop into both eyes 4 (four) times daily.   Pro-Stat Liqd Take 30 mLs by mouth daily.   sulfamethoxazole-trimethoprim 800-160 MG tablet Commonly known as: BACTRIM DS Take 1 tablet by mouth daily.   torsemide 10 MG tablet Commonly known as: DEMADEX 10 mg daily as needed.   vitamin B-12 1000 MCG tablet Commonly known as: CYANOCOBALAMIN Take 1 tablet (1,000 mcg total) by mouth daily.   white petrolatum ointment Apply topically 2 (two) times daily.   zinc oxide 20 % ointment Apply 1 application topically as needed for irritation. To buttocks after every incontinent episode and as needed for redness. May keep at bedside.  Allergies:  Allergies  Allergen Reactions   Penicillins Hives   Carbamazepine Other (See Comments) and Hives    Turned purple from neck down and drowsy Other reaction(s): Other (See Comments) Turned purple from neck down and drowsy   Pregabalin Other (See Comments)    Drowsy Other reaction(s): Confusion (intolerance) Drowsy    Past Medical History, Surgical history, Social history, and Family History were reviewed and updated.  Review of Systems: All other 10 point review of systems is negative.   Physical Exam:  vitals were not taken for this visit.   Wt Readings from Last 3 Encounters:   05/19/21 132 lb 9.6 oz (60.1 kg)  05/03/21 132 lb 9.6 oz (60.1 kg)  04/17/21 128 lb 6.4 oz (58.2 kg)    Ocular: Sclerae unicteric, pupils equal, round and reactive to light Ear-nose-throat: Oropharynx clear, dentition fair Lymphatic: No cervical, supraclavicular or axillary adenopathy Lungs no rales or rhonchi, good excursion bilaterally Heart regular rate and rhythm, no murmur appreciated Abd soft, nontender, positive bowel sounds, no liver or spleen tip palpated on exam, no fluid wave  MSK no focal spinal tenderness, no joint edema Neuro: non-focal, well-oriented, appropriate affect Breasts: Deferred   Lab Results  Component Value Date   WBC 49.9 (H) 05/29/2021   HGB 8.2 (L) 05/29/2021   HCT 25.5 (L) 05/29/2021   MCV 110.9 (H) 05/29/2021   PLT 161 05/29/2021   Lab Results  Component Value Date   FERRITIN 65 04/17/2021   IRON 48 04/17/2021   TIBC 209 (L) 04/17/2021   UIBC 161 04/17/2021   IRONPCTSAT 23 04/17/2021   Lab Results  Component Value Date   RBC 2.30 (L) 05/29/2021   Lab Results  Component Value Date   KPAFRELGTCHN 14.5 04/17/2021   LAMBDASER 23.4 04/17/2021   KAPLAMBRATIO 0.62 04/17/2021   Lab Results  Component Value Date   IGGSERUM 104 (L) 04/17/2021   IGA 249 04/17/2021   IGMSERUM 1,661 (H) 04/17/2021   No results found for: Kathrynn Ducking, MSPIKE, SPEI   Chemistry      Component Value Date/Time   NA 137 04/17/2021 1309   NA 137 11/10/2020 0000   NA 138 08/16/2017 1256   K 4.5 04/17/2021 1309   K 4.3 08/16/2017 1256   CL 104 04/17/2021 1309   CO2 25 04/17/2021 1309   CO2 25 08/16/2017 1256   BUN 41 (H) 04/17/2021 1309   BUN 16 11/10/2020 0000   BUN 19.6 08/16/2017 1256   CREATININE 1.77 (H) 04/17/2021 1309   CREATININE 0.7 08/16/2017 1256   GLU 78 11/10/2020 0000      Component Value Date/Time   CALCIUM 8.7 (L) 04/17/2021 1309   CALCIUM 9.2 08/16/2017 1256   ALKPHOS 80 04/17/2021 1309    ALKPHOS 42 08/16/2017 1256   AST 21 04/17/2021 1309   AST 18 08/16/2017 1256   ALT 10 04/17/2021 1309   ALT 11 08/16/2017 1256   BILITOT 0.2 (L) 04/17/2021 1309   BILITOT 0.87 08/16/2017 1256       Impression and Plan: Joy Lynch is a very pleasant 87 yo caucasian female with relapsed marginal zone lymphoma.  Protein studies and anemia lab work pending.  We will give Joy 1 unit of blood on Wednesday. We will also give Joy IV iron if needed.  We are working to get Joy set up with [facility] transportation to help Joy get to and from Joy appointments within the system.  Protein studies are  pending.  Follow-up in 1 month.  She can contact our office with any questions or concerns.   Laverna Peace, NP 8/29/20222:21 PM   Addendum: Labs reviewed with Dr. Marin Olp and we will get Joy started on Retacrit. I was able to speak with Joy nephew and they are agreeable to this. She will come in later this week for Joy first injection. Scheduling message sent.

## 2021-05-29 NOTE — Telephone Encounter (Signed)
Appts made per los and per stacey-blood,   Joy Lynch

## 2021-05-30 ENCOUNTER — Encounter: Payer: Self-pay | Admitting: Hematology & Oncology

## 2021-05-30 LAB — IGG, IGA, IGM
IgA: 201 mg/dL (ref 64–422)
IgG (Immunoglobin G), Serum: 128 mg/dL — ABNORMAL LOW (ref 586–1602)
IgM (Immunoglobulin M), Srm: 1930 mg/dL — ABNORMAL HIGH (ref 26–217)

## 2021-05-30 LAB — FERRITIN: Ferritin: 60 ng/mL (ref 11–307)

## 2021-05-30 LAB — IRON AND TIBC
Iron: 47 ug/dL (ref 41–142)
Saturation Ratios: 23 % (ref 21–57)
TIBC: 205 ug/dL — ABNORMAL LOW (ref 236–444)
UIBC: 158 ug/dL (ref 120–384)

## 2021-05-30 LAB — ERYTHROPOIETIN: Erythropoietin: 13.5 m[IU]/mL (ref 2.6–18.5)

## 2021-05-30 LAB — KAPPA/LAMBDA LIGHT CHAINS
Kappa free light chain: 17.1 mg/L (ref 3.3–19.4)
Kappa, lambda light chain ratio: 0.67 (ref 0.26–1.65)
Lambda free light chains: 25.4 mg/L (ref 5.7–26.3)

## 2021-05-30 LAB — BETA 2 MICROGLOBULIN, SERUM: Beta-2 Microglobulin: 5.5 mg/L — ABNORMAL HIGH (ref 0.6–2.4)

## 2021-05-31 ENCOUNTER — Inpatient Hospital Stay: Payer: Medicare Other

## 2021-05-31 ENCOUNTER — Other Ambulatory Visit: Payer: Self-pay

## 2021-05-31 DIAGNOSIS — D649 Anemia, unspecified: Secondary | ICD-10-CM

## 2021-05-31 DIAGNOSIS — C8590 Non-Hodgkin lymphoma, unspecified, unspecified site: Secondary | ICD-10-CM | POA: Diagnosis not present

## 2021-05-31 MED ORDER — DIPHENHYDRAMINE HCL 25 MG PO CAPS
25.0000 mg | ORAL_CAPSULE | Freq: Once | ORAL | Status: AC
  Administered 2021-05-31: 25 mg via ORAL
  Filled 2021-05-31: qty 1

## 2021-05-31 MED ORDER — ACETAMINOPHEN 325 MG PO TABS
650.0000 mg | ORAL_TABLET | Freq: Once | ORAL | Status: AC
  Administered 2021-05-31: 650 mg via ORAL
  Filled 2021-05-31: qty 2

## 2021-05-31 MED ORDER — SODIUM CHLORIDE 0.9% IV SOLUTION
250.0000 mL | Freq: Once | INTRAVENOUS | Status: AC
  Administered 2021-05-31: 250 mL via INTRAVENOUS

## 2021-05-31 NOTE — Telephone Encounter (Signed)
Oral Oncology Patient Advocate Encounter  Spoke with patient's son Joy Lynch on 05/22/21 to discuss applying for the [employer] Patient Assistance Program in an effort to reduce the patient's out of pocket expense for Calquence to $0. Emailed Joy Lynch the patient consent link and form for him to review and have signed.   Application completed and faxed to [phone] on 05/26/21.   [employer] patient assistance phone number for follow up is [phone].   This encounter Joy Lynch be updated until final determination.  [place] Patient [place] Phone [phone] Fax [phone] 05/31/2021 11:58 AM

## 2021-05-31 NOTE — Patient Instructions (Signed)
Blood Transfusion, Adult A blood transfusion is a procedure in which you receive blood or a type of blood cell (blood component) through an IV. You may need a blood transfusion when your blood level is low. This may result from a bleeding disorder, illness, injury, or surgery. The blood may come from a donor. You may also be able to donate blood for yourself (autologous blood donation) before a planned surgery. The blood given in a transfusion is made up of different blood components. You may receive: Red blood cells. These carry oxygen to the cells in the body. Platelets. These help your blood to clot. Plasma. This is the liquid part of your blood. It carries proteins and other substances throughout the body. White blood cells. These help you fight infections. If you have hemophilia or another clotting disorder, you may also receive other types of blood products. Tell a health care provider about: Any blood disorders you have. Any previous reactions you have had during a blood transfusion. Any allergies you have. All medicines you are taking, including vitamins, herbs, eye drops, creams, and over-the-counter medicines. Any surgeries you have had. Any medical conditions you have, including any recent fever or cold symptoms. Whether you are pregnant or may be pregnant. What are the risks? Generally, this is a safe procedure. However, problems may occur. The most common problems include: A mild allergic reaction, such as red, swollen areas of skin (hives) and itching. Fever or chills. This may be the body's response to new blood cells received. This may occur during or up to 4 hours after the transfusion. More serious problems may include: Transfusion-associated circulatory overload (TACO), or too much fluid in the lungs. This may cause breathing problems. A serious allergic reaction, such as difficulty breathing or swelling around the face and lips. Transfusion-related acute lung injury  (TRALI), which causes breathing difficulty and low oxygen in the blood. This can occur within hours of the transfusion or several days later. Iron overload. This can happen after receiving many blood transfusions over a period of time. Infection or virus being transmitted. This is rare because donated blood is carefully tested before it is given. Hemolytic transfusion reaction. This is rare. It happens when your body's defense system (immune system)tries to attack the new blood cells. Symptoms may include fever, chills, nausea, low blood pressure, and low back or chest pain. Transfusion-associated graft-versus-host disease (TAGVHD). This is rare. It happens when donated cells attack your body's healthy tissues. What happens before the procedure? Medicines Ask your health care provider about: Changing or stopping your regular medicines. This is especially important if you are taking diabetes medicines or blood thinners. Taking medicines such as aspirin and ibuprofen. These medicines can thin your blood. Do not take these medicines unless your health care provider tells you to take them. Taking over-the-counter medicines, vitamins, herbs, and supplements. General instructions Follow instructions from your health care provider about eating and drinking restrictions. You will have a blood test to determine your blood type. This is necessary to know what kind of blood your body will accept and to match it to the donor blood. If you are going to have a planned surgery, you may be able to do an autologous blood donation. This may be done in case you need to have a transfusion. You will have your temperature, blood pressure, and pulse monitored before the transfusion. If you have had an allergic reaction to a transfusion in the past, you may be given medicine to help prevent   a reaction. This medicine may be given to you by mouth (orally) or through an IV. Set aside time for the blood transfusion. This  procedure generally takes 1-4 hours to complete. What happens during the procedure?  An IV will be inserted into one of your veins. The bag of donated blood will be attached to your IV. The blood will then enter through your vein. Your temperature, blood pressure, and pulse will be monitored regularly during the transfusion. This monitoring is done to detect early signs of a transfusion reaction. Tell your nurse right away if you have any of these symptoms during the transfusion: Shortness of breath or trouble breathing. Chest or back pain. Fever or chills. Hives or itching. If you have any signs or symptoms of a reaction, your transfusion will be stopped and you may be given medicine. When the transfusion is complete, your IV will be removed. Pressure may be applied to the IV site for a few minutes. A bandage (dressing)will be applied. The procedure may vary among health care providers and hospitals. What happens after the procedure? Your temperature, blood pressure, pulse, breathing rate, and blood oxygen level will be monitored until you leave the hospital or clinic. Your blood may be tested to see how you are responding to the transfusion. You may be warmed with fluids or blankets to maintain a normal body temperature. If you receive your blood transfusion in an outpatient setting, you will be told whom to contact to report any reactions. Where to find more information For more information on blood transfusions, visit the American Red Cross: redcross.org Summary A blood transfusion is a procedure in which you receive blood or a type of blood cell (blood component) through an IV. The blood you receive may come from a donor or be donated by yourself (autologous blood donation) before a planned surgery. The blood given in a transfusion is made up of different blood components. You may receive red blood cells, platelets, plasma, or white blood cells depending on the condition treated. Your  temperature, blood pressure, and pulse will be monitored before, during, and after the transfusion. After the transfusion, your blood may be tested to see how your body has responded. This information is not intended to replace advice given to you by your health care provider. Make sure you discuss any questions you have with your health care provider. Document Revised: 07/23/2019 Document Reviewed: 03/12/2019 Elsevier Patient Education  2022 Elsevier Inc.  

## 2021-06-01 ENCOUNTER — Encounter: Payer: Self-pay | Admitting: Internal Medicine

## 2021-06-01 ENCOUNTER — Non-Acute Institutional Stay: Payer: Medicare Other | Admitting: Internal Medicine

## 2021-06-01 DIAGNOSIS — M545 Low back pain, unspecified: Secondary | ICD-10-CM

## 2021-06-01 DIAGNOSIS — D630 Anemia in neoplastic disease: Secondary | ICD-10-CM

## 2021-06-01 DIAGNOSIS — E039 Hypothyroidism, unspecified: Secondary | ICD-10-CM

## 2021-06-01 DIAGNOSIS — G500 Trigeminal neuralgia: Secondary | ICD-10-CM

## 2021-06-01 DIAGNOSIS — N1832 Chronic kidney disease, stage 3b: Secondary | ICD-10-CM | POA: Diagnosis not present

## 2021-06-01 DIAGNOSIS — C8588 Other specified types of non-Hodgkin lymphoma, lymph nodes of multiple sites: Secondary | ICD-10-CM

## 2021-06-01 DIAGNOSIS — R5383 Other fatigue: Secondary | ICD-10-CM | POA: Diagnosis not present

## 2021-06-01 DIAGNOSIS — R600 Localized edema: Secondary | ICD-10-CM

## 2021-06-01 DIAGNOSIS — G8929 Other chronic pain: Secondary | ICD-10-CM

## 2021-06-01 LAB — TYPE AND SCREEN
ABO/RH(D): B POS
Antibody Screen: NEGATIVE
Unit division: 0

## 2021-06-01 LAB — BPAM RBC
Blood Product Expiration Date: 202209032359
ISSUE DATE / TIME: 202208310731
Unit Type and Rh: 7300

## 2021-06-01 LAB — FOLATE

## 2021-06-01 NOTE — Telephone Encounter (Signed)
Oral Oncology Patient Advocate Encounter  Called [employer] to check status of application.  Had to request a call back when a representative is available.  [place] Patient [place] Phone [phone] Fax [phone] 06/01/2021 3:28 PM

## 2021-06-01 NOTE — Progress Notes (Signed)
Location:   [facility] Room Number: 9 Place of Service:  ALF [phone]) Provider:  Veleta Miners MD  Virgie Dad, MD  Patient Care Team: Virgie Dad, MD as PCP - General (Internal Medicine)  Extended Emergency Contact Information Primary Emergency Contact: Genia Plants Address: [address]          [place], [place] [address] Johnnette Litter of [place] Phone: [phone] Relation: Nephew Secondary Emergency Contact: [name] Address: [address]          [place], [place] [address] Johnnette Litter of [place] Phone: [phone] Mobile Phone: [phone] Relation: Nephew  Code Status:  Full Code Managed Care Goals of care: Advanced Directive information Advanced Directives 06/01/2021  Does Patient Have a Medical Advance Directive? Yes  Type of Paramedic of [place];Living will  Does patient want to make changes to medical advance directive? No - Patient declined  Copy of [place] in Chart? Yes - validated most recent copy scanned in chart (See row information)  Would patient like information on creating a medical advance directive? -     Chief Complaint  Patient presents with   Medical Management of Chronic Issues   Quality Metric Gaps    Shingrix    HPI:  Pt is a 87 y.o. female seen today for medical management of chronic diseases.    Patient has a history of Relapsed Marginal Zone  lymphoma followed by oncology now on Oral therapy  osteoporosis,   hypothyroidism,  trigeminal neuralgia and migraines   s/p  frontoparietal subarachnoid hemorrhage and Rib fractures  Recently Started on New Therapy with Calquence. Also Got one unit of Blood per hematology due to Anemia and Fatigue Feels Better. Tolerating New Oral therapy Her only complain was Lower back Pain No Falls She says it is her old Chronic Pain  I don't see any imaging in Epic  Walks with her walker Weight is stable     Past  Medical History:  Diagnosis Date   Cancer ([place])    non hodgkin lymphoma   Chronic lymphocytic leukemia ([place])    History of B-cell lymphoma 11/02/2015   Migraines    Osteoporosis    Thyroid disease    Trigeminal neuralgia of left side of face    Past Surgical History:  Procedure Laterality Date   gamma knife     for trigeminal neuralgia   SHOULDER SURGERY     Right shoulder replacement   TONSILLECTOMY AND ADENOIDECTOMY     WISDOM TOOTH EXTRACTION      Allergies  Allergen Reactions   Carbamazepine Hives and Other (See Comments)    Turned purple from neck down and drowsy    Penicillins Hives   Pregabalin Other (See Comments)    Drowsy    Allergies as of 06/01/2021       Reactions   Carbamazepine Hives, Other (See Comments)   Turned purple from neck down and drowsy   Penicillins Hives   Pregabalin Other (See Comments)   Drowsy        Medication List        Accurate as of June 01, 2021 10:13 AM. If you have any questions, ask your nurse or doctor.          acetaminophen 500 MG tablet Commonly known as: TYLENOL Take 500 mg by mouth every 6 (six) hours.   acetaminophen 500 MG tablet Commonly known as: TYLENOL Take 500 mg by mouth 3 (  three) times daily.   aspirin 81 MG chewable tablet Chew 81 mg by mouth daily.   Calquence 100 MG capsule Generic drug: acalabrutinib Take 1 capsule (100 mg total) by mouth daily.   famciclovir 125 MG tablet Commonly known as: FAMVIR TAKE 2 TABLETS (250MG ) BY MOUTH DAILY   gabapentin 600 MG tablet Commonly known as: NEURONTIN Take 600 mg by mouth 3 (three) times daily.   lactose free nutrition Liqd Take 237 mLs by mouth daily.   levothyroxine 100 MCG tablet Commonly known as: SYNTHROID Take 100 mcg by mouth daily before breakfast.   methocarbamol 500 MG tablet Commonly known as: ROBAXIN Take 250 mg by mouth daily.   Multi-Vitamins Tabs Take 1 tablet by mouth daily.   polyvinyl alcohol 1.4 % ophthalmic  solution Commonly known as: LIQUIFILM TEARS Place 1 drop into both eyes 4 (four) times daily.   Pro-Stat Liqd Take 30 mLs by mouth daily.   sulfamethoxazole-trimethoprim 800-160 MG tablet Commonly known as: BACTRIM DS Take 1 tablet by mouth daily.   torsemide 10 MG tablet Commonly known as: DEMADEX 10 mg daily as needed.   vitamin B-12 1000 MCG tablet Commonly known as: CYANOCOBALAMIN Take 1 tablet (1,000 mcg total) by mouth daily.   white petrolatum ointment Apply topically 2 (two) times daily.   zinc oxide 20 % ointment Apply 1 application topically as needed for irritation. To buttocks after every incontinent episode and as needed for redness. May keep at bedside.        Review of Systems Review of Systems  Constitutional: Negative for activity change, appetite change, chills, diaphoresis, fatigue and fever.  HENT: Negative for mouth sores, postnasal drip, rhinorrhea, sinus pain and sore throat.   Respiratory: Negative for apnea, cough, chest tightness, shortness of breath and wheezing.   Cardiovascular: Negative for chest pain, palpitations and leg swelling.  Gastrointestinal: Negative for abdominal distention, abdominal pain, constipation, diarrhea, nausea and vomiting.  Genitourinary: Negative for dysuria and frequency.  Musculoskeletal: Negative for arthralgias, joint swelling and myalgias.  Skin: Negative for rash.  Neurological: Negative for dizziness, syncope, weakness, light-headedness and numbness.  Psychiatric/Behavioral: Negative for behavioral problems, confusion and sleep disturbance.    Immunization History  Administered Date(s) Administered   Fluad Quad(high Dose 65+) 05/28/2019   Influenza Split 07/12/2016, 08/13/2017, 06/02/2019, 09/09/2020   Influenza, High Dose Seasonal PF 06/11/2017, 06/28/2018   Influenza-Unspecified 06/21/2020   Moderna SARS-COV2 Booster Vaccination 03/08/2021   Moderna Sars-Covid-2 Vaccination 10/05/2019, 11/02/2019,  08/15/2020   Pneumococcal Conjugate-13 11/22/2014   Pneumococcal Polysaccharide-23 04/29/2009, 08/13/2017   Td 01/24/2013   Tdap 12/28/2017, 07/01/2018, 12/13/2020   Zoster Recombinat (Shingrix) 07/01/2018   Zoster, Live 11/12/2009, 07/01/2018   Pertinent  Health Maintenance Due  Topic Date Due   INFLUENZA VACCINE  07/25/2024 (Originally 05/01/2021)   DEXA SCAN  Completed   PNA vac Low Risk Adult  Completed   Fall Risk  05/03/2021  Falls in the past year? 1  Number falls in past yr: 0  Injury with Fall? 0  Risk for fall due to : History of fall(s);Impaired balance/gait;Impaired mobility  Follow up Falls evaluation completed;Education provided;Falls prevention discussed   Functional Status Survey:    Vitals:   06/01/21 1000  BP: 118/66  Pulse: 94  Resp: 20  Temp: (!) 97 F (36.1 C)  SpO2: 96%  Weight: 132 lb 9.6 oz (60.1 kg)  Height: 5\' 6"  (1.676 m)   Body mass index is 21.4 kg/m. Physical Exam Constitutional: Oriented to person, place, and  time. Well-developed and well-nourished.  HENT:  Head: Normocephalic.  Mouth/Throat: Oropharynx is clear and moist.  Eyes: Pupils are equal, round, and reactive to light.  Neck: Neck supple.  Cardiovascular: Normal rate and normal heart sounds.  No murmur heard. Pulmonary/Chest: Effort normal and breath sounds normal. No respiratory distress. No wheezes. She has no rales.  Abdominal: Soft. Bowel sounds are normal. No distension. There is no tenderness. There is no rebound.  Musculoskeletal: No edema.  Lymphadenopathy: none Neurological: Alert and oriented to person, place, and time.  Skin: Skin is warm and dry.  Psychiatric: Normal mood and affect. Behavior is normal. Thought content normal.   Labs reviewed: Recent Labs    12/13/20 1020 04/17/21 1309 05/29/21 1407  NA 138 137 137  K 4.6 4.5 4.0  CL 100 104 105  CO2 29 25 22   GLUCOSE 101* 85 142*  BUN 41* 41* 39*  CREATININE 1.60* 1.77* 2.42*  CALCIUM 9.7 8.7* 8.5*    Recent Labs    12/13/20 1020 04/17/21 1309 05/29/21 1407  AST 20 21 20   ALT 8 10 10   ALKPHOS 49 80 59  BILITOT 0.2* 0.2* 0.3  PROT 6.7 6.1* 5.9*  ALBUMIN 2.6* 2.4* 2.4*   Recent Labs    12/13/20 1020 04/17/21 1309 05/29/21 1407  WBC 92.6* 30.5* 49.9*  NEUTROABS 5.0 4.7 8.5*  HGB 10.6* 9.4* 8.2*  HCT 33.8* 29.2* 25.5*  MCV 108.0* 111.0* 110.9*  PLT 234 226 161   Lab Results  Component Value Date   TSH 27.99 (A) 09/08/2020   No results found for: HGBA1C No results found for: CHOL, HDL, LDLCALC, LDLDIRECT, TRIG, CHOLHDL  Significant Diagnostic Results in last 30 days:  No results found.  Assessment/Plan Acquired hypothyroidism TSH repeat Pending  Fatigue, unspecified type S/P PRBC transfusion Has anemia and Marginal Lymphoma  Lymphoma, marginal zone, lymph nodes of multiple sites ([place]) Started on New Med per Hematology Also on Famvir and Bactrim  Stage 3b chronic kidney disease ([place]) Sudden Worsening of Creat in recent Blood work done in Hematology office Repeat is pending Will follow  Bilateral leg edema On Low Dose of Demadex PRN  Trigeminal neuralgia On High Dose of Neurontin Chronic bilateral low back pain without sciatica Will try Voltaren for few weeks If pain worsening will Do Xrays Already in Tylenol and Robaxin No Red flag signs Anemia in neoplastic disease S/p Transfusion   Family/ staff Communication:   Labs/tests ordered:

## 2021-06-06 ENCOUNTER — Other Ambulatory Visit: Payer: Self-pay | Admitting: Family

## 2021-06-07 ENCOUNTER — Telehealth: Payer: Self-pay

## 2021-06-08 NOTE — Telephone Encounter (Signed)
Oral Oncology Patient Advocate Encounter  Due to system upgrade all faxed applications were put in a queue and the program is working through them.  Rep stated for me to submit an application online to get faster approval.  Submitted application online with approval from [employer], patients nephew and POA, and received notification that the patient is enrolled in their program to receive Calquence from 06/08/21-09/30/21.  [place] Patient [place] Phone [phone] Fax [phone] 06/08/2021 3:54 PM

## 2021-06-12 ENCOUNTER — Other Ambulatory Visit: Payer: Self-pay

## 2021-06-12 ENCOUNTER — Inpatient Hospital Stay: Payer: Medicare Other | Attending: Hematology & Oncology

## 2021-06-12 ENCOUNTER — Inpatient Hospital Stay: Payer: Medicare Other

## 2021-06-12 VITALS — BP 87/47 | HR 56 | Resp 18

## 2021-06-12 DIAGNOSIS — N183 Chronic kidney disease, stage 3 unspecified: Secondary | ICD-10-CM | POA: Diagnosis not present

## 2021-06-12 DIAGNOSIS — C8588 Other specified types of non-Hodgkin lymphoma, lymph nodes of multiple sites: Secondary | ICD-10-CM

## 2021-06-12 DIAGNOSIS — N1832 Chronic kidney disease, stage 3b: Secondary | ICD-10-CM

## 2021-06-12 DIAGNOSIS — N1831 Chronic kidney disease, stage 3a: Secondary | ICD-10-CM

## 2021-06-12 DIAGNOSIS — D509 Iron deficiency anemia, unspecified: Secondary | ICD-10-CM

## 2021-06-12 DIAGNOSIS — D631 Anemia in chronic kidney disease: Secondary | ICD-10-CM | POA: Diagnosis present

## 2021-06-12 LAB — CBC WITH DIFFERENTIAL (CANCER CENTER ONLY)
Abs Immature Granulocytes: 0.1 10*3/uL — ABNORMAL HIGH (ref 0.00–0.07)
Basophils Absolute: 0.1 10*3/uL (ref 0.0–0.1)
Basophils Relative: 0 %
Eosinophils Absolute: 0.1 10*3/uL (ref 0.0–0.5)
Eosinophils Relative: 0 %
HCT: 29.1 % — ABNORMAL LOW (ref 36.0–46.0)
Hemoglobin: 9.5 g/dL — ABNORMAL LOW (ref 12.0–15.0)
Immature Granulocytes: 0 %
Lymphocytes Relative: 81 %
Lymphs Abs: 26.9 10*3/uL — ABNORMAL HIGH (ref 0.7–4.0)
MCH: 34.2 pg — ABNORMAL HIGH (ref 26.0–34.0)
MCHC: 32.6 g/dL (ref 30.0–36.0)
MCV: 104.7 fL — ABNORMAL HIGH (ref 80.0–100.0)
Monocytes Absolute: 0.8 10*3/uL (ref 0.1–1.0)
Monocytes Relative: 2 %
Neutro Abs: 5.8 10*3/uL (ref 1.7–7.7)
Neutrophils Relative %: 17 %
Platelet Count: 169 10*3/uL (ref 150–400)
RBC: 2.78 MIL/uL — ABNORMAL LOW (ref 3.87–5.11)
RDW: 16.5 % — ABNORMAL HIGH (ref 11.5–15.5)
WBC Count: 33.8 10*3/uL — ABNORMAL HIGH (ref 4.0–10.5)
nRBC: 0 % (ref 0.0–0.2)

## 2021-06-12 LAB — CMP (CANCER CENTER ONLY)
ALT: 10 U/L (ref 0–44)
AST: 18 U/L (ref 15–41)
Albumin: 2.6 g/dL — ABNORMAL LOW (ref 3.5–5.0)
Alkaline Phosphatase: 57 U/L (ref 38–126)
Anion gap: 9 (ref 5–15)
BUN: 44 mg/dL — ABNORMAL HIGH (ref 8–23)
CO2: 23 mmol/L (ref 22–32)
Calcium: 8.7 mg/dL — ABNORMAL LOW (ref 8.9–10.3)
Chloride: 106 mmol/L (ref 98–111)
Creatinine: 2.04 mg/dL — ABNORMAL HIGH (ref 0.44–1.00)
GFR, Estimated: 22 mL/min — ABNORMAL LOW (ref >60)
Glucose, Bld: 88 mg/dL (ref 70–99)
Potassium: 5 mmol/L (ref 3.5–5.1)
Sodium: 138 mmol/L (ref 135–145)
Total Bilirubin: 0.3 mg/dL (ref 0.3–1.2)
Total Protein: 5.9 g/dL — ABNORMAL LOW (ref 6.5–8.1)

## 2021-06-12 MED ORDER — EPOETIN ALFA-EPBX 40000 UNIT/ML IJ SOLN: 40000.0000 [IU] | Freq: Once | INTRAMUSCULAR | Status: DC

## 2021-06-12 MED ORDER — EPOETIN ALFA-EPBX 20000 UNIT/ML IJ SOLN
40000.0000 [IU] | Freq: Once | INTRAMUSCULAR | Status: AC
  Administered 2021-06-12: 40000 [IU] via SUBCUTANEOUS
  Filled 2021-06-12: qty 2

## 2021-06-12 MED ORDER — EPOETIN ALFA-EPBX 40000 UNIT/ML IJ SOLN: 20000.0000 [IU] | Freq: Once | INTRAMUSCULAR | Status: DC

## 2021-06-12 NOTE — Patient Instructions (Signed)
Epoetin Alfa injection What is this medication? EPOETIN ALFA (e POE e tin AL fa) helps your body make more red blood cells. This medicine is used to treat anemia caused by chronic kidney disease, cancer chemotherapy, or HIV-therapy. It may also be used before surgery if you have anemia. This medicine may be used for other purposes; ask your health care provider or pharmacist if you have questions. COMMON BRAND NAME(S): Epogen, Procrit, Retacrit What should I tell my care team before I take this medication? They need to know if you have any of these conditions: cancer heart disease high blood pressure history of blood clots history of stroke low levels of folate, iron, or vitamin B12 in the blood seizures an unusual or allergic reaction to erythropoietin, albumin, benzyl alcohol, hamster proteins, other medicines, foods, dyes, or preservatives pregnant or trying to get pregnant breast-feeding How should I use this medication? This medicine is for injection into a vein or under the skin. It is usually given by a health care professional in a hospital or clinic setting. If you get this medicine at home, you will be taught how to prepare and give this medicine. Use exactly as directed. Take your medicine at regular intervals. Do not take your medicine more often than directed. It is important that you put your used needles and syringes in a special sharps container. Do not put them in a trash can. If you do not have a sharps container, call your pharmacist or healthcare provider to get one. A special MedGuide will be given to you by the pharmacist with each prescription and refill. Be sure to read this information carefully each time. Talk to your pediatrician regarding the use of this medicine in children. While this drug may be prescribed for selected conditions, precautions do apply. Overdosage: If you think you have taken too much of this medicine contact a poison control center or emergency  room at once. NOTE: This medicine is only for you. Do not share this medicine with others. What if I miss a dose? If you miss a dose, take it as soon as you can. If it is almost time for your next dose, take only that dose. Do not take double or extra doses. What may interact with this medication? Interactions have not been studied. This list may not describe all possible interactions. Give your health care provider a list of all the medicines, herbs, non-prescription drugs, or dietary supplements you use. Also tell them if you smoke, drink alcohol, or use illegal drugs. Some items may interact with your medicine. What should I watch for while using this medication? Your condition will be monitored carefully while you are receiving this medicine. You may need blood work done while you are taking this medicine. This medicine may cause a decrease in vitamin B6. You should make sure that you get enough vitamin B6 while you are taking this medicine. Discuss the foods you eat and the vitamins you take with your health care professional. What side effects may I notice from receiving this medication? Side effects that you should report to your doctor or health care professional as soon as possible: allergic reactions like skin rash, itching or hives, swelling of the face, lips, or tongue seizures signs and symptoms of a blood clot such as breathing problems; changes in vision; chest pain; severe, sudden headache; pain, swelling, warmth in the leg; trouble speaking; sudden numbness or weakness of the face, arm or leg signs and symptoms of a stroke like   changes in vision; confusion; trouble speaking or understanding; severe headaches; sudden numbness or weakness of the face, arm or leg; trouble walking; dizziness; loss of balance or coordination Side effects that usually do not require medical attention (report to your doctor or health care professional if they continue or are  bothersome): chills cough dizziness fever headaches joint pain muscle cramps muscle pain nausea, vomiting pain, redness, or irritation at site where injected This list may not describe all possible side effects. Call your doctor for medical advice about side effects. You may report side effects to FDA at 1-800-FDA-1088. Where should I keep my medication? Keep out of the reach of children. Store in a refrigerator between 2 and 8 degrees C (36 and 46 degrees F). Do not freeze or shake. Throw away any unused portion if using a single-dose vial. Multi-dose vials can be kept in the refrigerator for up to 21 days after the initial dose. Throw away unused medicine. NOTE: This sheet is a summary. It may not cover all possible information. If you have questions about this medicine, talk to your doctor, pharmacist, or health care provider.  2022 Elsevier/Gold Standard (2017-04-26 08:35:19)  

## 2021-06-15 ENCOUNTER — Other Ambulatory Visit: Payer: Self-pay | Admitting: *Deleted

## 2021-06-15 DIAGNOSIS — C8588 Other specified types of non-Hodgkin lymphoma, lymph nodes of multiple sites: Secondary | ICD-10-CM

## 2021-06-15 MED ORDER — CALQUENCE 100 MG PO CAPS: 100.0000 mg | ORAL_CAPSULE | Freq: Every day | ORAL | 11 refills | Status: DC

## 2021-06-15 NOTE — Telephone Encounter (Signed)
Printed prescription for Calquence faxed to [facility] at [phone] per request of J. McAdams.

## 2021-06-20 ENCOUNTER — Telehealth: Payer: Self-pay | Admitting: *Deleted

## 2021-06-20 ENCOUNTER — Other Ambulatory Visit (HOSPITAL_COMMUNITY): Payer: Self-pay

## 2021-06-20 ENCOUNTER — Other Ambulatory Visit: Payer: Self-pay | Admitting: *Deleted

## 2021-06-20 DIAGNOSIS — C8588 Other specified types of non-Hodgkin lymphoma, lymph nodes of multiple sites: Secondary | ICD-10-CM

## 2021-06-20 MED ORDER — CALQUENCE 100 MG PO TABS: 100.0000 mg | ORAL_TABLET | Freq: Every day | ORAL | 11 refills | Status: DC

## 2021-06-20 NOTE — Telephone Encounter (Signed)
Only Calquence tablets are available now.  Prescriptions sent to [facility] ([facility]) must be for tablets.

## 2021-06-20 NOTE — Telephone Encounter (Signed)
Received call from [facility] from [facility] regarding patients [name].  Patient has one more tablet left until she runs out.  Explained to Toco that we have been working on processing the prescription.  As of today, the process has been expedited but may still take a few days to receive the medication.  Joy Lynch understands instructions and appreciates the call

## 2021-06-29 ENCOUNTER — Telehealth: Payer: Self-pay | Admitting: *Deleted

## 2021-06-29 NOTE — Telephone Encounter (Signed)
Message received from patient's nephew, Joy Lynch wanting to know what he needs to do to refill pt.'s Calquence.  Call placed back to Joy Lynch and notified him to contact [facility] at [phone] per Dennison Nancy.  Joy Lynch is appreciative of information and has no further questions at this time.

## 2021-07-03 ENCOUNTER — Inpatient Hospital Stay: Payer: Medicare Other | Admitting: Family

## 2021-07-03 ENCOUNTER — Inpatient Hospital Stay: Payer: Medicare Other

## 2021-07-03 ENCOUNTER — Telehealth: Payer: Self-pay | Admitting: *Deleted

## 2021-07-03 NOTE — Telephone Encounter (Signed)
Received an email from [facility] stating that PAtients medication has been shipped.

## 2021-07-05 ENCOUNTER — Telehealth: Payer: Self-pay | Admitting: *Deleted

## 2021-07-05 NOTE — Telephone Encounter (Signed)
Called the [facility] to get patient rescheduled with Dr. Marin Olp. Nurse will call back to discuss the appointments.

## 2021-07-10 ENCOUNTER — Telehealth: Payer: Self-pay | Admitting: *Deleted

## 2021-07-10 NOTE — Telephone Encounter (Signed)
Spoke with transportation @ [facility] as well lvm the patient of upcoming appointments - requested callback to confirm.

## 2021-07-21 ENCOUNTER — Other Ambulatory Visit: Payer: Self-pay

## 2021-07-21 ENCOUNTER — Inpatient Hospital Stay (HOSPITAL_BASED_OUTPATIENT_CLINIC_OR_DEPARTMENT_OTHER): Payer: Medicare Other | Admitting: Family

## 2021-07-21 ENCOUNTER — Inpatient Hospital Stay: Payer: Medicare Other | Attending: Hematology & Oncology

## 2021-07-21 ENCOUNTER — Encounter: Payer: Self-pay | Admitting: Family

## 2021-07-21 ENCOUNTER — Inpatient Hospital Stay: Payer: Medicare Other

## 2021-07-21 VITALS — BP 102/54 | HR 96 | Temp 97.7°F | Resp 18 | Ht 66.0 in | Wt 128.1 lb

## 2021-07-21 DIAGNOSIS — N183 Chronic kidney disease, stage 3 unspecified: Secondary | ICD-10-CM | POA: Diagnosis not present

## 2021-07-21 DIAGNOSIS — Z8572 Personal history of non-Hodgkin lymphomas: Secondary | ICD-10-CM

## 2021-07-21 DIAGNOSIS — N1832 Chronic kidney disease, stage 3b: Secondary | ICD-10-CM

## 2021-07-21 DIAGNOSIS — N1831 Chronic kidney disease, stage 3a: Secondary | ICD-10-CM

## 2021-07-21 DIAGNOSIS — C8588 Other specified types of non-Hodgkin lymphoma, lymph nodes of multiple sites: Secondary | ICD-10-CM

## 2021-07-21 DIAGNOSIS — E8809 Other disorders of plasma-protein metabolism, not elsewhere classified: Secondary | ICD-10-CM

## 2021-07-21 DIAGNOSIS — D61818 Other pancytopenia: Secondary | ICD-10-CM

## 2021-07-21 DIAGNOSIS — D509 Iron deficiency anemia, unspecified: Secondary | ICD-10-CM

## 2021-07-21 DIAGNOSIS — D649 Anemia, unspecified: Secondary | ICD-10-CM

## 2021-07-21 DIAGNOSIS — D631 Anemia in chronic kidney disease: Secondary | ICD-10-CM | POA: Insufficient documentation

## 2021-07-21 LAB — CBC WITH DIFFERENTIAL (CANCER CENTER ONLY)
Abs Immature Granulocytes: 0.07 10*3/uL (ref 0.00–0.07)
Basophils Absolute: 0.1 10*3/uL (ref 0.0–0.1)
Basophils Relative: 0 %
Eosinophils Absolute: 0.3 10*3/uL (ref 0.0–0.5)
Eosinophils Relative: 1 %
HCT: 30.1 % — ABNORMAL LOW (ref 36.0–46.0)
Hemoglobin: 9.5 g/dL — ABNORMAL LOW (ref 12.0–15.0)
Immature Granulocytes: 0 %
Lymphocytes Relative: 82 %
Lymphs Abs: 28 10*3/uL — ABNORMAL HIGH (ref 0.7–4.0)
MCH: 34.7 pg — ABNORMAL HIGH (ref 26.0–34.0)
MCHC: 31.6 g/dL (ref 30.0–36.0)
MCV: 109.9 fL — ABNORMAL HIGH (ref 80.0–100.0)
Monocytes Absolute: 0.9 10*3/uL (ref 0.1–1.0)
Monocytes Relative: 3 %
Neutro Abs: 4.6 10*3/uL (ref 1.7–7.7)
Neutrophils Relative %: 14 %
Platelet Count: 176 10*3/uL (ref 150–400)
RBC: 2.74 MIL/uL — ABNORMAL LOW (ref 3.87–5.11)
RDW: 16.1 % — ABNORMAL HIGH (ref 11.5–15.5)
WBC Count: 33.9 10*3/uL — ABNORMAL HIGH (ref 4.0–10.5)
nRBC: 0 % (ref 0.0–0.2)

## 2021-07-21 LAB — IRON AND TIBC
Iron: 86 ug/dL (ref 28–170)
Saturation Ratios: 35 % — ABNORMAL HIGH (ref 10.4–31.8)
TIBC: 249 ug/dL — ABNORMAL LOW (ref 250–450)
UIBC: 163 ug/dL

## 2021-07-21 LAB — CMP (CANCER CENTER ONLY)
ALT: 9 U/L (ref 0–44)
AST: 17 U/L (ref 15–41)
Albumin: 2.7 g/dL — ABNORMAL LOW (ref 3.5–5.0)
Alkaline Phosphatase: 51 U/L (ref 38–126)
Anion gap: 9 (ref 5–15)
BUN: 45 mg/dL — ABNORMAL HIGH (ref 8–23)
CO2: 21 mmol/L — ABNORMAL LOW (ref 22–32)
Calcium: 9.5 mg/dL (ref 8.9–10.3)
Chloride: 108 mmol/L (ref 98–111)
Creatinine: 2.03 mg/dL — ABNORMAL HIGH (ref 0.44–1.00)
GFR, Estimated: 22 mL/min — ABNORMAL LOW (ref >60)
Glucose, Bld: 89 mg/dL (ref 70–99)
Potassium: 4.8 mmol/L (ref 3.5–5.1)
Sodium: 138 mmol/L (ref 135–145)
Total Bilirubin: 0.3 mg/dL (ref 0.3–1.2)
Total Protein: 6.5 g/dL (ref 6.5–8.1)

## 2021-07-21 LAB — SAMPLE TO BLOOD BANK

## 2021-07-21 LAB — RETICULOCYTES
Immature Retic Fract: 12.5 % (ref 2.3–15.9)
RBC.: 2.74 MIL/uL — ABNORMAL LOW (ref 3.87–5.11)
Retic Count, Absolute: 51.5 10*3/uL (ref 19.0–186.0)
Retic Ct Pct: 1.9 % (ref 0.4–3.1)

## 2021-07-21 LAB — FERRITIN: Ferritin: 72 ng/mL (ref 11–307)

## 2021-07-21 LAB — LACTATE DEHYDROGENASE: LDH: 189 U/L (ref 98–192)

## 2021-07-21 MED ORDER — EPOETIN ALFA-EPBX 40000 UNIT/ML IJ SOLN: 40000.0000 [IU] | Freq: Once | INTRAMUSCULAR | Status: DC

## 2021-07-21 MED ORDER — EPOETIN ALFA-EPBX 40000 UNIT/ML IJ SOLN
40000.0000 [IU] | Freq: Once | INTRAMUSCULAR | Status: AC
  Administered 2021-07-21: 40000 [IU] via SUBCUTANEOUS
  Filled 2021-07-21: qty 1

## 2021-07-21 NOTE — Progress Notes (Signed)
Hematology and Oncology Follow Up Visit  Joy Lynch [id number] 1926/02/22 87 y.o. 07/21/2021   Principle Diagnosis:  Marginal Zone lymphoma -- relapsed Anemia secondary to chronic kidney disease, stage III   Past Therapy: Umbralisib 400 mg po q day, started on 10/22/2020 - discontinued by manufacturer  Retacrit SQ as indicated for Hgb < 11   Current Therapy:        Calquence 100 mg PO daily, started 05/25/2021   Interim History:  Ms. Joy Lynch is here today with her nephew or follow-up. She is doing well but notes some occasional SOB with over exertion. She takes a break to rest as needed.  Her WBC count is now 33.9, Hgb 9.5, MCV 109 and platelets 176.  She is tolerating Calquence nicely and was able to start about 2 weeks ago once PA was completed.  No fever, chills, n/v, cough, rash, dizziness, chest pain, palpitations, abdominal pain or changes in bowel or bladder habits.  No swelling, tenderness, numbness or tingling in her extremities.  No falls or syncope to report.  No blood loss noted.  She has been eating well and is doing her best to stay well hydrated. Her weight is 128 lbs.   ECOG Performance Status: 1 - Symptomatic but completely ambulatory  Medications:  Allergies as of 07/21/2021       Reactions   Carbamazepine Hives, Other (See Comments)   Turned purple from neck down and drowsy   Penicillins Hives   Pregabalin Other (See Comments)   Drowsy        Medication List        Accurate as of July 21, 2021 10:16 AM. If you have any questions, ask your nurse or doctor.          acetaminophen 500 MG tablet Commonly known as: TYLENOL Take 500 mg by mouth every 6 (six) hours.   acetaminophen 500 MG tablet Commonly known as: TYLENOL Take 500 mg by mouth 3 (three) times daily.   aspirin 81 MG chewable tablet Chew 81 mg by mouth daily.   Calquence 100 MG capsule Generic drug: acalabrutinib Take 1 capsule (100 mg total) by mouth daily.   Calquence  100 MG Tabs Generic drug: Acalabrutinib Maleate Take 100 mg by mouth daily.   famciclovir 125 MG tablet Commonly known as: FAMVIR TAKE 2 TABLETS (250MG ) BY MOUTH DAILY   gabapentin 600 MG tablet Commonly known as: NEURONTIN Take 600 mg by mouth 3 (three) times daily.   lactose free nutrition Liqd Take 237 mLs by mouth daily.   levothyroxine 100 MCG tablet Commonly known as: SYNTHROID Take 100 mcg by mouth daily before breakfast.   methocarbamol 500 MG tablet Commonly known as: ROBAXIN Take 250 mg by mouth daily.   Multi-Vitamins Tabs Take 1 tablet by mouth daily.   polyvinyl alcohol 1.4 % ophthalmic solution Commonly known as: LIQUIFILM TEARS Place 1 drop into both eyes 4 (four) times daily.   Pro-Stat Liqd Take 30 mLs by mouth daily.   sulfamethoxazole-trimethoprim 800-160 MG tablet Commonly known as: BACTRIM DS Take 1 tablet by mouth daily.   torsemide 10 MG tablet Commonly known as: DEMADEX 10 mg daily as needed.   vitamin B-12 1000 MCG tablet Commonly known as: CYANOCOBALAMIN Take 1 tablet (1,000 mcg total) by mouth daily.   white petrolatum ointment Apply topically 2 (two) times daily.   zinc oxide 20 % ointment Apply 1 application topically as needed for irritation. To buttocks after every incontinent episode and as needed for  redness. May keep at bedside.        Allergies:  Allergies  Allergen Reactions   Carbamazepine Hives and Other (See Comments)    Turned purple from neck down and drowsy    Penicillins Hives   Pregabalin Other (See Comments)    Drowsy    Past Medical History, Surgical history, Social history, and Family History were reviewed and updated.  Review of Systems: All other 10 point review of systems is negative.   Physical Exam:  vitals were not taken for this visit.   Wt Readings from Last 3 Encounters:  06/01/21 132 lb 9.6 oz (60.1 kg)  05/29/21 132 lb 1.9 oz (59.9 kg)  05/19/21 132 lb 9.6 oz (60.1 kg)    Ocular:  Sclerae unicteric, pupils equal, round and reactive to light Ear-nose-throat: Oropharynx clear, dentition fair Lymphatic: No cervical or supraclavicular adenopathy Lungs no rales or rhonchi, good excursion bilaterally Heart regular rate and rhythm, no murmur appreciated Abd soft, nontender, positive bowel sounds MSK no focal spinal tenderness, no joint edema Neuro: non-focal, well-oriented, appropriate affect Breasts: Deferred   Lab Results  Component Value Date   WBC 33.9 (H) 07/21/2021   HGB 9.5 (L) 07/21/2021   HCT 30.1 (L) 07/21/2021   MCV 109.9 (H) 07/21/2021   PLT 176 07/21/2021   Lab Results  Component Value Date   FERRITIN 60 05/29/2021   IRON 47 05/29/2021   TIBC 205 (L) 05/29/2021   UIBC 158 05/29/2021   IRONPCTSAT 23 05/29/2021   Lab Results  Component Value Date   RETICCTPCT 1.9 07/21/2021   RBC 2.74 (L) 07/21/2021   RBC 2.74 (L) 07/21/2021   Lab Results  Component Value Date   KPAFRELGTCHN 17.1 05/29/2021   LAMBDASER 25.4 05/29/2021   KAPLAMBRATIO 0.67 05/29/2021   Lab Results  Component Value Date   IGGSERUM 128 (L) 05/29/2021   IGA 201 05/29/2021   IGMSERUM 1,930 (H) 05/29/2021   No results found for: Kathrynn Ducking, MSPIKE, SPEI   Chemistry      Component Value Date/Time   NA 138 06/12/2021 1254   NA 137 11/10/2020 0000   NA 138 08/16/2017 1256   K 5.0 06/12/2021 1254   K 4.3 08/16/2017 1256   CL 106 06/12/2021 1254   CO2 23 06/12/2021 1254   CO2 25 08/16/2017 1256   BUN 44 (H) 06/12/2021 1254   BUN 16 11/10/2020 0000   BUN 19.6 08/16/2017 1256   CREATININE 2.04 (H) 06/12/2021 1254   CREATININE 0.7 08/16/2017 1256   GLU 78 11/10/2020 0000      Component Value Date/Time   CALCIUM 8.7 (L) 06/12/2021 1254   CALCIUM 9.2 08/16/2017 1256   ALKPHOS 57 06/12/2021 1254   ALKPHOS 42 08/16/2017 1256   AST 18 06/12/2021 1254   AST 18 08/16/2017 1256   ALT 10 06/12/2021 1254   ALT 11 08/16/2017 1256    BILITOT 0.3 06/12/2021 1254   BILITOT 0.87 08/16/2017 1256       Impression and Plan: Joy Lynch is a very pleasant 87 yo caucasian female with relapsed marginal zone lymphoma and multifactorial anemia.  She is tolerating Calquence nicely and will continue her same regimen.  ESA given for Hgb 9.5.  Iron and protein studies are pending.  Follow-up in  1 month.  They can contact our office with any questions or concerns.   Lottie Dawson, NP 10/21/202210:16 AM

## 2021-07-21 NOTE — Patient Instructions (Signed)
Epoetin Alfa injection What is this medication? EPOETIN ALFA (e POE e tin AL fa) helps your body make more red blood cells. This medicine is used to treat anemia caused by chronic kidney disease, cancer chemotherapy, or HIV-therapy. It may also be used before surgery if you have anemia. This medicine may be used for other purposes; ask your health care provider or pharmacist if you have questions. COMMON BRAND NAME(S): Epogen, Procrit, Retacrit What should I tell my care team before I take this medication? They need to know if you have any of these conditions: cancer heart disease high blood pressure history of blood clots history of stroke low levels of folate, iron, or vitamin B12 in the blood seizures an unusual or allergic reaction to erythropoietin, albumin, benzyl alcohol, hamster proteins, other medicines, foods, dyes, or preservatives pregnant or trying to get pregnant breast-feeding How should I use this medication? This medicine is for injection into a vein or under the skin. It is usually given by a health care professional in a hospital or clinic setting. If you get this medicine at home, you will be taught how to prepare and give this medicine. Use exactly as directed. Take your medicine at regular intervals. Do not take your medicine more often than directed. It is important that you put your used needles and syringes in a special sharps container. Do not put them in a trash can. If you do not have a sharps container, call your pharmacist or healthcare provider to get one. A special MedGuide will be given to you by the pharmacist with each prescription and refill. Be sure to read this information carefully each time. Talk to your pediatrician regarding the use of this medicine in children. While this drug may be prescribed for selected conditions, precautions do apply. Overdosage: If you think you have taken too much of this medicine contact a poison control center or emergency  room at once. NOTE: This medicine is only for you. Do not share this medicine with others. What if I miss a dose? If you miss a dose, take it as soon as you can. If it is almost time for your next dose, take only that dose. Do not take double or extra doses. What may interact with this medication? Interactions have not been studied. This list may not describe all possible interactions. Give your health care provider a list of all the medicines, herbs, non-prescription drugs, or dietary supplements you use. Also tell them if you smoke, drink alcohol, or use illegal drugs. Some items may interact with your medicine. What should I watch for while using this medication? Your condition will be monitored carefully while you are receiving this medicine. You may need blood work done while you are taking this medicine. This medicine may cause a decrease in vitamin B6. You should make sure that you get enough vitamin B6 while you are taking this medicine. Discuss the foods you eat and the vitamins you take with your health care professional. What side effects may I notice from receiving this medication? Side effects that you should report to your doctor or health care professional as soon as possible: allergic reactions like skin rash, itching or hives, swelling of the face, lips, or tongue seizures signs and symptoms of a blood clot such as breathing problems; changes in vision; chest pain; severe, sudden headache; pain, swelling, warmth in the leg; trouble speaking; sudden numbness or weakness of the face, arm or leg signs and symptoms of a stroke like   changes in vision; confusion; trouble speaking or understanding; severe headaches; sudden numbness or weakness of the face, arm or leg; trouble walking; dizziness; loss of balance or coordination Side effects that usually do not require medical attention (report to your doctor or health care professional if they continue or are  bothersome): chills cough dizziness fever headaches joint pain muscle cramps muscle pain nausea, vomiting pain, redness, or irritation at site where injected This list may not describe all possible side effects. Call your doctor for medical advice about side effects. You may report side effects to FDA at 1-800-FDA-1088. Where should I keep my medication? Keep out of the reach of children. Store in a refrigerator between 2 and 8 degrees C (36 and 46 degrees F). Do not freeze or shake. Throw away any unused portion if using a single-dose vial. Multi-dose vials can be kept in the refrigerator for up to 21 days after the initial dose. Throw away unused medicine. NOTE: This sheet is a summary. It may not cover all possible information. If you have questions about this medicine, talk to your doctor, pharmacist, or health care provider.  2022 Elsevier/Gold Standard (2017-04-26 08:35:19)  

## 2021-07-22 LAB — IGG, IGA, IGM
IgA: 233 mg/dL (ref 64–422)
IgG (Immunoglobin G), Serum: 142 mg/dL — ABNORMAL LOW (ref 586–1602)
IgM (Immunoglobulin M), Srm: 2070 mg/dL — ABNORMAL HIGH (ref 26–217)

## 2021-07-22 LAB — BETA 2 MICROGLOBULIN, SERUM: Beta-2 Microglobulin: 6.6 mg/L — ABNORMAL HIGH (ref 0.6–2.4)

## 2021-07-24 LAB — KAPPA/LAMBDA LIGHT CHAINS
Kappa free light chain: 20 mg/L — ABNORMAL HIGH (ref 3.3–19.4)
Kappa, lambda light chain ratio: 0.85 (ref 0.26–1.65)
Lambda free light chains: 23.6 mg/L (ref 5.7–26.3)

## 2021-08-21 ENCOUNTER — Inpatient Hospital Stay (HOSPITAL_BASED_OUTPATIENT_CLINIC_OR_DEPARTMENT_OTHER): Payer: Medicare Other | Admitting: Family

## 2021-08-21 ENCOUNTER — Other Ambulatory Visit: Payer: Self-pay

## 2021-08-21 ENCOUNTER — Encounter: Payer: Self-pay | Admitting: Family

## 2021-08-21 ENCOUNTER — Inpatient Hospital Stay: Payer: Medicare Other | Attending: Hematology & Oncology

## 2021-08-21 ENCOUNTER — Inpatient Hospital Stay: Payer: Medicare Other

## 2021-08-21 VITALS — BP 100/41 | HR 103 | Temp 97.6°F | Resp 19 | Ht 66.0 in

## 2021-08-21 DIAGNOSIS — D509 Iron deficiency anemia, unspecified: Secondary | ICD-10-CM | POA: Diagnosis not present

## 2021-08-21 DIAGNOSIS — Z79899 Other long term (current) drug therapy: Secondary | ICD-10-CM | POA: Diagnosis not present

## 2021-08-21 DIAGNOSIS — Z8572 Personal history of non-Hodgkin lymphomas: Secondary | ICD-10-CM

## 2021-08-21 DIAGNOSIS — C8588 Other specified types of non-Hodgkin lymphoma, lymph nodes of multiple sites: Secondary | ICD-10-CM

## 2021-08-21 DIAGNOSIS — D510 Vitamin B12 deficiency anemia due to intrinsic factor deficiency: Secondary | ICD-10-CM | POA: Diagnosis not present

## 2021-08-21 DIAGNOSIS — N1832 Chronic kidney disease, stage 3b: Secondary | ICD-10-CM

## 2021-08-21 DIAGNOSIS — N1831 Chronic kidney disease, stage 3a: Secondary | ICD-10-CM

## 2021-08-21 DIAGNOSIS — N183 Chronic kidney disease, stage 3 unspecified: Secondary | ICD-10-CM | POA: Insufficient documentation

## 2021-08-21 DIAGNOSIS — D631 Anemia in chronic kidney disease: Secondary | ICD-10-CM | POA: Diagnosis present

## 2021-08-21 DIAGNOSIS — D61818 Other pancytopenia: Secondary | ICD-10-CM | POA: Diagnosis not present

## 2021-08-21 LAB — CBC WITH DIFFERENTIAL (CANCER CENTER ONLY)
Abs Immature Granulocytes: 0.06 10*3/uL (ref 0.00–0.07)
Basophils Absolute: 0.1 10*3/uL (ref 0.0–0.1)
Basophils Relative: 0 %
Eosinophils Absolute: 0.2 10*3/uL (ref 0.0–0.5)
Eosinophils Relative: 1 %
HCT: 28.6 % — ABNORMAL LOW (ref 36.0–46.0)
Hemoglobin: 9 g/dL — ABNORMAL LOW (ref 12.0–15.0)
Immature Granulocytes: 0 %
Lymphocytes Relative: 71 %
Lymphs Abs: 17.1 10*3/uL — ABNORMAL HIGH (ref 0.7–4.0)
MCH: 34.7 pg — ABNORMAL HIGH (ref 26.0–34.0)
MCHC: 31.5 g/dL (ref 30.0–36.0)
MCV: 110.4 fL — ABNORMAL HIGH (ref 80.0–100.0)
Monocytes Absolute: 0.6 10*3/uL (ref 0.1–1.0)
Monocytes Relative: 3 %
Neutro Abs: 5.9 10*3/uL (ref 1.7–7.7)
Neutrophils Relative %: 25 %
Platelet Count: 178 10*3/uL (ref 150–400)
RBC: 2.59 MIL/uL — ABNORMAL LOW (ref 3.87–5.11)
RDW: 14.8 % (ref 11.5–15.5)
WBC Count: 23.9 10*3/uL — ABNORMAL HIGH (ref 4.0–10.5)
nRBC: 0 % (ref 0.0–0.2)

## 2021-08-21 LAB — SAMPLE TO BLOOD BANK

## 2021-08-21 LAB — CMP (CANCER CENTER ONLY)
ALT: 10 U/L (ref 0–44)
AST: 16 U/L (ref 15–41)
Albumin: 2.7 g/dL — ABNORMAL LOW (ref 3.5–5.0)
Alkaline Phosphatase: 44 U/L (ref 38–126)
Anion gap: 9 (ref 5–15)
BUN: 46 mg/dL — ABNORMAL HIGH (ref 8–23)
CO2: 22 mmol/L (ref 22–32)
Calcium: 9 mg/dL (ref 8.9–10.3)
Chloride: 105 mmol/L (ref 98–111)
Creatinine: 2.07 mg/dL — ABNORMAL HIGH (ref 0.44–1.00)
GFR, Estimated: 22 mL/min — ABNORMAL LOW (ref >60)
Glucose, Bld: 96 mg/dL (ref 70–99)
Potassium: 4.7 mmol/L (ref 3.5–5.1)
Sodium: 136 mmol/L (ref 135–145)
Total Bilirubin: 0.3 mg/dL (ref 0.3–1.2)
Total Protein: 6 g/dL — ABNORMAL LOW (ref 6.5–8.1)

## 2021-08-21 LAB — RETICULOCYTES
Immature Retic Fract: 12 % (ref 2.3–15.9)
RBC.: 2.58 MIL/uL — ABNORMAL LOW (ref 3.87–5.11)
Retic Count, Absolute: 49 10*3/uL (ref 19.0–186.0)
Retic Ct Pct: 1.9 % (ref 0.4–3.1)

## 2021-08-21 LAB — FOLATE: Folate: 34.8 ng/mL (ref >5.9)

## 2021-08-21 LAB — VITAMIN B12: Vitamin B-12: 1756 pg/mL — ABNORMAL HIGH (ref 180–914)

## 2021-08-21 LAB — LACTATE DEHYDROGENASE: LDH: 165 U/L (ref 98–192)

## 2021-08-21 MED ORDER — EPOETIN ALFA-EPBX 40000 UNIT/ML IJ SOLN
40000.0000 [IU] | Freq: Once | INTRAMUSCULAR | Status: AC
  Administered 2021-08-21: 40000 [IU] via SUBCUTANEOUS
  Filled 2021-08-21: qty 1

## 2021-08-21 NOTE — Progress Notes (Signed)
Hematology and Oncology Follow Up Visit  Joy Lynch [id number] 10-26-1925 87 y.o. 08/21/2021   Principle Diagnosis:  Marginal Zone lymphoma -- relapsed Anemia secondary to chronic kidney disease, stage III   Past Therapy: Umbralisib 400 mg po q day, started on 10/22/2020 - discontinued by manufacturer  Retacrit SQ as indicated for Hgb < 11    Current Therapy:        Calquence 100 mg PO daily, started 05/25/2021   Interim History:  Joy Lynch is here today for follow-up. She is doing fairly well but notes some fatigue as well as mild swelling in her ankles. This has been there for a couple months.  Hgb is 9.0, MCV 110, platelets 178 and WBC count is down to 23.9.  She has not noted any blood loss. No bruising or petechiae.  No fever, chills, n/v, cough, rash, dizziness, SOB, chest pain, palpitations, abdominal pain or changes in bowel or bladder habits.  No numbness or tingling in her extremities at this time.  She has chronic lower back pain that waxes and wanes.  No falls or syncope to report.  She has maintained a good appetite and is staying well hydrated. She did not want to stand for weight today.   ECOG Performance Status: 2 - Symptomatic, <50% confined to bed  Medications:  Allergies as of 08/21/2021       Reactions   Carbamazepine Hives, Other (See Comments)   Turned purple from neck down and drowsy   Penicillins Hives   Pregabalin Other (See Comments)   Drowsy        Medication List        Accurate as of August 21, 2021 11:58 AM. If you have any questions, ask your nurse or doctor.          acetaminophen 500 MG tablet Commonly known as: TYLENOL Take 500 mg by mouth every 6 (six) hours.   acetaminophen 500 MG tablet Commonly known as: TYLENOL Take 500 mg by mouth 3 (three) times daily.   aspirin 81 MG chewable tablet Chew 81 mg by mouth daily.   Calquence 100 MG capsule Generic drug: acalabrutinib Take 1 capsule (100 mg total) by mouth  daily.   Calquence 100 MG Tabs Generic drug: Acalabrutinib Maleate Take 100 mg by mouth daily.   famciclovir 125 MG tablet Commonly known as: FAMVIR TAKE 2 TABLETS (250MG ) BY MOUTH DAILY   gabapentin 600 MG tablet Commonly known as: NEURONTIN Take 600 mg by mouth 3 (three) times daily.   lactose free nutrition Liqd Take 237 mLs by mouth daily.   levothyroxine 100 MCG tablet Commonly known as: SYNTHROID Take 100 mcg by mouth daily before breakfast.   methocarbamol 500 MG tablet Commonly known as: ROBAXIN Take 250 mg by mouth daily.   Multi-Vitamins Tabs Take 1 tablet by mouth daily.   polyvinyl alcohol 1.4 % ophthalmic solution Commonly known as: LIQUIFILM TEARS Place 1 drop into both eyes 4 (four) times daily.   Pro-Stat Liqd Take 30 mLs by mouth daily.   sulfamethoxazole-trimethoprim 800-160 MG tablet Commonly known as: BACTRIM DS Take 1 tablet by mouth daily.   torsemide 10 MG tablet Commonly known as: DEMADEX 10 mg daily as needed.   vitamin B-12 1000 MCG tablet Commonly known as: CYANOCOBALAMIN Take 1 tablet (1,000 mcg total) by mouth daily.   white petrolatum ointment Apply topically 2 (two) times daily.   zinc oxide 20 % ointment Apply 1 application topically as needed for irritation. To buttocks after  every incontinent episode and as needed for redness. May keep at bedside.        Allergies:  Allergies  Allergen Reactions   Carbamazepine Hives and Other (See Comments)    Turned purple from neck down and drowsy    Penicillins Hives   Pregabalin Other (See Comments)    Drowsy    Past Medical History, Surgical history, Social history, and Family History were reviewed and updated.  Review of Systems: All other 10 point review of systems is negative.   Physical Exam:  vitals were not taken for this visit.   Wt Readings from Last 3 Encounters:  07/21/21 128 lb 1.9 oz (58.1 kg)  06/01/21 132 lb 9.6 oz (60.1 kg)  05/29/21 132 lb 1.9 oz  (59.9 kg)    Ocular: Sclerae unicteric, pupils equal, round and reactive to light Ear-nose-throat: Oropharynx clear, dentition fair Lymphatic: No cervical or supraclavicular adenopathy Lungs no rales or rhonchi, good excursion bilaterally Heart regular rate and rhythm, no murmur appreciated Abd soft, nontender, positive bowel sounds MSK no focal spinal tenderness, no joint edema Neuro: non-focal, well-oriented, appropriate affect Breasts: Deferred   Lab Results  Component Value Date   WBC 23.9 (H) 08/21/2021   HGB 9.0 (L) 08/21/2021   HCT 28.6 (L) 08/21/2021   MCV 110.4 (H) 08/21/2021   PLT 178 08/21/2021   Lab Results  Component Value Date   FERRITIN 72 07/21/2021   IRON 86 07/21/2021   TIBC 249 (L) 07/21/2021   UIBC 163 07/21/2021   IRONPCTSAT 35 (H) 07/21/2021   Lab Results  Component Value Date   RETICCTPCT 1.9 08/21/2021   RBC 2.58 (L) 08/21/2021   Lab Results  Component Value Date   KPAFRELGTCHN 20.0 (H) 07/21/2021   LAMBDASER 23.6 07/21/2021   KAPLAMBRATIO 0.85 07/21/2021   Lab Results  Component Value Date   IGGSERUM 142 (L) 07/21/2021   IGA 233 07/21/2021   IGMSERUM 2,070 (H) 07/21/2021   No results found for: Odetta Pink, SPEI   Chemistry      Component Value Date/Time   NA 138 07/21/2021 0950   NA 137 11/10/2020 0000   NA 138 08/16/2017 1256   K 4.8 07/21/2021 0950   K 4.3 08/16/2017 1256   CL 108 07/21/2021 0950   CO2 21 (L) 07/21/2021 0950   CO2 25 08/16/2017 1256   BUN 45 (H) 07/21/2021 0950   BUN 16 11/10/2020 0000   BUN 19.6 08/16/2017 1256   CREATININE 2.03 (H) 07/21/2021 0950   CREATININE 0.7 08/16/2017 1256   GLU 78 11/10/2020 0000      Component Value Date/Time   CALCIUM 9.5 07/21/2021 0950   CALCIUM 9.2 08/16/2017 1256   ALKPHOS 51 07/21/2021 0950   ALKPHOS 42 08/16/2017 1256   AST 17 07/21/2021 0950   AST 18 08/16/2017 1256   ALT 9 07/21/2021 0950   ALT 11 08/16/2017  1256   BILITOT 0.3 07/21/2021 0950   BILITOT 0.87 08/16/2017 1256       Impression and Plan: Joy Lynch is a very pleasant 87 yo caucasian female with relapsed marginal zone lymphoma and multifactorial anemia.  ESA given, Hgb 9.0.  Iron studies and B 12 are pending. We will replace if needed.  She will try propping up her feet while sitting as well.  She will continue her same regimen with Calquence.  Follow-up in 4 weeks. They can contact our office with any questions or concerns.   Judson Roch  Eulas Post, NP 11/21/202211:58 AM

## 2021-08-21 NOTE — Patient Instructions (Signed)
Epoetin Alfa injection °What is this medication? °EPOETIN ALFA (e POE e tin AL fa) helps your body make more red blood cells. This medicine is used to treat anemia caused by chronic kidney disease, cancer chemotherapy, or HIV-therapy. It may also be used before surgery if you have anemia. °This medicine may be used for other purposes; ask your health care provider or pharmacist if you have questions. °COMMON BRAND NAME(S): Epogen, Procrit, Retacrit °What should I tell my care team before I take this medication? °They need to know if you have any of these conditions: °cancer °heart disease °high blood pressure °history of blood clots °history of stroke °low levels of folate, iron, or vitamin B12 in the blood °seizures °an unusual or allergic reaction to erythropoietin, albumin, benzyl alcohol, hamster proteins, other medicines, foods, dyes, or preservatives °pregnant or trying to get pregnant °breast-feeding °How should I use this medication? °This medicine is for injection into a vein or under the skin. It is usually given by a health care professional in a hospital or clinic setting. °If you get this medicine at home, you will be taught how to prepare and give this medicine. Use exactly as directed. Take your medicine at regular intervals. Do not take your medicine more often than directed. °It is important that you put your used needles and syringes in a special sharps container. Do not put them in a trash can. If you do not have a sharps container, call your pharmacist or healthcare provider to get one. °A special MedGuide will be given to you by the pharmacist with each prescription and refill. Be sure to read this information carefully each time. °Talk to your pediatrician regarding the use of this medicine in children. While this drug may be prescribed for selected conditions, precautions do apply. °Overdosage: If you think you have taken too much of this medicine contact a poison control center or emergency  room at once. °NOTE: This medicine is only for you. Do not share this medicine with others. °What if I miss a dose? °If you miss a dose, take it as soon as you can. If it is almost time for your next dose, take only that dose. Do not take double or extra doses. °What may interact with this medication? °Interactions have not been studied. °This list may not describe all possible interactions. Give your health care provider a list of all the medicines, herbs, non-prescription drugs, or dietary supplements you use. Also tell them if you smoke, drink alcohol, or use illegal drugs. Some items may interact with your medicine. °What should I watch for while using this medication? °Your condition will be monitored carefully while you are receiving this medicine. °You may need blood work done while you are taking this medicine. °This medicine may cause a decrease in vitamin B6. You should make sure that you get enough vitamin B6 while you are taking this medicine. Discuss the foods you eat and the vitamins you take with your health care professional. °What side effects may I notice from receiving this medication? °Side effects that you should report to your doctor or health care professional as soon as possible: °allergic reactions like skin rash, itching or hives, swelling of the face, lips, or tongue °seizures °signs and symptoms of a blood clot such as breathing problems; changes in vision; chest pain; severe, sudden headache; pain, swelling, warmth in the leg; trouble speaking; sudden numbness or weakness of the face, arm or leg °signs and symptoms of a stroke like   changes in vision; confusion; trouble speaking or understanding; severe headaches; sudden numbness or weakness of the face, arm or leg; trouble walking; dizziness; loss of balance or coordination °Side effects that usually do not require medical attention (report to your doctor or health care professional if they continue or are  bothersome): °chills °cough °dizziness °fever °headaches °joint pain °muscle cramps °muscle pain °nausea, vomiting °pain, redness, or irritation at site where injected °This list may not describe all possible side effects. Call your doctor for medical advice about side effects. You may report side effects to FDA at 1-800-FDA-1088. °Where should I keep my medication? °Keep out of the reach of children. °Store in a refrigerator between 2 and 8 degrees C (36 and 46 degrees F). Do not freeze or shake. Throw away any unused portion if using a single-dose vial. Multi-dose vials can be kept in the refrigerator for up to 21 days after the initial dose. Throw away unused medicine. °NOTE: This sheet is a summary. It may not cover all possible information. If you have questions about this medicine, talk to your doctor, pharmacist, or health care provider. °© 2022 Elsevier/Gold Standard (2017-05-21 00:00:00) ° °

## 2021-08-22 ENCOUNTER — Telehealth: Payer: Self-pay | Admitting: Family

## 2021-08-22 LAB — FERRITIN: Ferritin: 95 ng/mL (ref 11–307)

## 2021-08-22 LAB — KAPPA/LAMBDA LIGHT CHAINS
Kappa free light chain: 20.5 mg/L — ABNORMAL HIGH (ref 3.3–19.4)
Kappa, lambda light chain ratio: 0.92 (ref 0.26–1.65)
Lambda free light chains: 22.4 mg/L (ref 5.7–26.3)

## 2021-08-22 LAB — IRON AND TIBC
Iron: 72 ug/dL (ref 41–142)
Saturation Ratios: 34 % (ref 21–57)
TIBC: 214 ug/dL — ABNORMAL LOW (ref 236–444)
UIBC: 142 ug/dL (ref 120–384)

## 2021-08-22 LAB — IGG, IGA, IGM
IgA: 221 mg/dL (ref 64–422)
IgG (Immunoglobin G), Serum: 168 mg/dL — ABNORMAL LOW (ref 586–1602)
IgM (Immunoglobulin M), Srm: 1672 mg/dL — ABNORMAL HIGH (ref 26–217)

## 2021-08-22 LAB — BETA 2 MICROGLOBULIN, SERUM: Beta-2 Microglobulin: 6.1 mg/L — ABNORMAL HIGH (ref 0.6–2.4)

## 2021-08-22 NOTE — Telephone Encounter (Signed)
Scheduled appt per 11/21 los - patient son Joy Lynch is aware of appt date and time for 09/18/21

## 2021-09-15 ENCOUNTER — Encounter: Payer: Self-pay | Admitting: Orthopedic Surgery

## 2021-09-15 ENCOUNTER — Non-Acute Institutional Stay: Payer: Medicare Other | Admitting: Orthopedic Surgery

## 2021-09-15 DIAGNOSIS — C8588 Other specified types of non-Hodgkin lymphoma, lymph nodes of multiple sites: Secondary | ICD-10-CM | POA: Diagnosis not present

## 2021-09-15 DIAGNOSIS — M810 Age-related osteoporosis without current pathological fracture: Secondary | ICD-10-CM

## 2021-09-15 DIAGNOSIS — E039 Hypothyroidism, unspecified: Secondary | ICD-10-CM | POA: Diagnosis not present

## 2021-09-15 DIAGNOSIS — M545 Low back pain, unspecified: Secondary | ICD-10-CM

## 2021-09-15 DIAGNOSIS — G500 Trigeminal neuralgia: Secondary | ICD-10-CM

## 2021-09-15 DIAGNOSIS — G43001 Migraine without aura, not intractable, with status migrainosus: Secondary | ICD-10-CM

## 2021-09-15 DIAGNOSIS — R269 Unspecified abnormalities of gait and mobility: Secondary | ICD-10-CM

## 2021-09-15 DIAGNOSIS — D630 Anemia in neoplastic disease: Secondary | ICD-10-CM | POA: Diagnosis not present

## 2021-09-15 DIAGNOSIS — R600 Localized edema: Secondary | ICD-10-CM

## 2021-09-15 DIAGNOSIS — N1832 Chronic kidney disease, stage 3b: Secondary | ICD-10-CM

## 2021-09-15 DIAGNOSIS — G8929 Other chronic pain: Secondary | ICD-10-CM

## 2021-09-15 NOTE — Progress Notes (Signed)
Location:   [facility] Room Number: 9 Place of Service:  ALF [phone]) Provider:  Windell Moulding, NP  Virgie Dad, MD  Patient Care Team: Virgie Dad, MD as PCP - General (Internal Medicine)  Extended Emergency Contact Information Primary Emergency Contact: Genia Plants Address: [address]          [place], [place] [address] Johnnette Litter of [place] Phone: [phone] Relation: Alanson Puls Secondary Emergency Contact: [name] Address: [address]          [place], [place] [address] Johnnette Litter of [place] Phone: [phone] Mobile Phone: [phone] Relation: Nephew  Code Status:  FULL CODE Goals of care: Advanced Directive information Advanced Directives 09/15/2021  Does Patient Have a Medical Advance Directive? Yes  Type of Paramedic of [place];Living will  Does patient want to make changes to medical advance directive? No - Patient declined  Copy of [place] in Chart? Yes - validated most recent copy scanned in chart (See row information)  Would patient like information on creating a medical advance directive? -     Chief Complaint  Patient presents with   Medical Management of Chronic Issues    Routine follow up visit.   Health Maintenance    Zoster vaccine, 2nd COVID booster    HPI:  Pt is a 87 y.o. female seen today for medical management of chronic diseases.    She currently resides in assisted living at [facility]. Past medical history includes: hypertension, trigeminal neuralgia, migraines, hypothyroidism, cerebellar infarct, trigeminal neuralgia, lymphoma, CKD stage III, anemia of neoplastic disease, osteoporosis, and gait abnormality.  Hypothyroidism- TSH 2.03 08/03/2021, remains on levothyroxine Relapsed marginal zone lymphoma- WBC 23.9 08/21/2021, remains on Calquence po daily- started 05/25/2021 Anemia- hgb 9.011/21/2022, iron 72, TIBC 214, ferritin 08/21/2021,  remains on monthly epoetin injections CKD 3b- BUN/creat 46/2.07 08/21/2021 BLE- remains on torsemide prn Trigeminal neuralgia- remains on gabapentin Chronic back pain- denies increased back pain today, unsuccessful trial of Voltaren gel, remains on tylenol and robaxin Osteoporosis- 02/25/2020 tscore - 3.8 Migraines- no recent episodes, of propranolol Gait abnormality- no recent falls, ambulates with walker  Recent blood pressures:  12/16- 132/80  12/15- 134/66  12/14- 126/75  Recent weights:  12/02- 128.8 lbs  11/02- 127.2 lbs  10/01- 127 lbs    Past Medical History:  Diagnosis Date   Cancer ([place])    non hodgkin lymphoma   Chronic lymphocytic leukemia ([place])    History of B-cell lymphoma 11/02/2015   Migraines    Osteoporosis    Thyroid disease    Trigeminal neuralgia of left side of face    Past Surgical History:  Procedure Laterality Date   gamma knife     for trigeminal neuralgia   SHOULDER SURGERY     Right shoulder replacement   TONSILLECTOMY AND ADENOIDECTOMY     WISDOM TOOTH EXTRACTION      Allergies  Allergen Reactions   Carbamazepine Hives and Other (See Comments)    Turned purple from neck down and drowsy    Penicillins Hives   Pregabalin Other (See Comments)    Drowsy    Allergies as of 09/15/2021       Reactions   Carbamazepine Hives, Other (See Comments)   Turned purple from neck down and drowsy   Penicillins Hives   Pregabalin Other (See Comments)   Drowsy        Medication List  Accurate as of September 15, 2021  9:30 AM. If you have any questions, ask your nurse or doctor.          acetaminophen 500 MG tablet Commonly known as: TYLENOL Take 500 mg by mouth every 6 (six) hours as needed.   aspirin 81 MG chewable tablet Chew 81 mg by mouth daily.   Bactrim DS 800-160 MG tablet Generic drug: sulfamethoxazole-trimethoprim Take 1 tablet by mouth daily.   Calquence 100 MG Tabs Generic drug: Acalabrutinib Maleate Take  100 mg by mouth daily.   famciclovir 125 MG tablet Commonly known as: FAMVIR TAKE 2 TABLETS (250MG ) BY MOUTH DAILY   gabapentin 600 MG tablet Commonly known as: NEURONTIN Take 600 mg by mouth 3 (three) times daily.   lactose free nutrition Liqd Take 237 mLs by mouth daily.   levothyroxine 100 MCG tablet Commonly known as: SYNTHROID Take 100 mcg by mouth daily before breakfast.   methocarbamol 500 MG tablet Commonly known as: ROBAXIN Take 250 mg by mouth daily.   Multi-Vitamins Tabs Take 1 tablet by mouth daily.   polyvinyl alcohol 1.4 % ophthalmic solution Commonly known as: LIQUIFILM TEARS Place 1 drop into both eyes 4 (four) times daily.   Pro-Stat Liqd Take 30 mLs by mouth daily.   torsemide 10 MG tablet Commonly known as: DEMADEX 10 mg daily as needed.   vitamin B-12 1000 MCG tablet Commonly known as: CYANOCOBALAMIN Take 1 tablet (1,000 mcg total) by mouth daily.   white petrolatum ointment Apply topically 2 (two) times daily.   zinc oxide 20 % ointment Apply 1 application topically as needed for irritation. To buttocks after every incontinent episode and as needed for redness. May keep at bedside.        Review of Systems  Constitutional:  Negative for activity change, appetite change, chills, fatigue and fever.  HENT:  Negative for congestion, hearing loss and trouble swallowing.   Eyes:  Negative for visual disturbance.  Respiratory:  Negative for cough, shortness of breath and wheezing.   Cardiovascular:  Positive for leg swelling. Negative for chest pain.  Gastrointestinal:  Negative for abdominal distention, abdominal pain, blood in stool, constipation, diarrhea, nausea and vomiting.  Genitourinary:  Negative for dysuria, frequency and hematuria.  Musculoskeletal:  Positive for arthralgias and gait problem.  Skin:  Negative for rash and wound.  Neurological:  Positive for weakness. Negative for dizziness and headaches.  Psychiatric/Behavioral:   Negative for confusion, dysphoric mood and sleep disturbance. The patient is not nervous/anxious.    Immunization History  Administered Date(s) Administered   Fluad Quad(high Dose 65+) 05/28/2019   Influenza Split 07/12/2016, 08/13/2017, 06/02/2019, 09/09/2020   Influenza, High Dose Seasonal PF 06/11/2017, 06/28/2018   Influenza-Unspecified 06/21/2020, 07/25/2021   Moderna SARS-COV2 Booster Vaccination 03/08/2021   Moderna Sars-Covid-2 Vaccination 10/05/2019, 11/02/2019, 08/15/2020   PFIZER(Purple Top)SARS-COV-2 Vaccination 06/21/2021   Pneumococcal Conjugate-13 11/22/2014   Pneumococcal Polysaccharide-23 04/29/2009, 08/13/2017   Td 01/24/2013   Tdap 12/28/2017, 07/01/2018, 12/13/2020   Zoster Recombinat (Shingrix) 07/01/2018   Zoster, Live 11/12/2009, 07/01/2018   Pertinent  Health Maintenance Due  Topic Date Due   INFLUENZA VACCINE  Completed   DEXA SCAN  Completed   Fall Risk 05/03/2021 05/29/2021 05/31/2021 07/21/2021 08/21/2021  Falls in the past year? 1 - - - -  Was there an injury with Fall? 0 - - - -  Fall Risk Category Calculator 1 - - - -  Fall Risk Category Low - - - -  Patient Fall Risk  Level High fall risk High fall risk High fall risk High fall risk High fall risk  Patient at Risk for Falls Due to History of fall(s);Impaired balance/gait;Impaired mobility - - - -  Fall risk Follow up Falls evaluation completed;Education provided;Falls prevention discussed - - - -   Functional Status Survey:    Vitals:   09/15/21 0913  BP: 132/80  Pulse: 98  Temp: (!) 97 F (36.1 C)  SpO2: 91%  Weight: 128 lb 12.8 oz (58.4 kg)  Height: 5\' 6"  (1.676 m)   Body mass index is 20.79 kg/m. Physical Exam Vitals reviewed.  Constitutional:      General: She is not in acute distress. HENT:     Head: Normocephalic.     Right Ear: There is no impacted cerumen.     Left Ear: There is no impacted cerumen.     Nose: Nose normal.     Mouth/Throat:     Mouth: Mucous membranes are  moist.  Eyes:     General:        Right eye: No discharge.        Left eye: No discharge.  Neck:     Vascular: No carotid bruit.  Cardiovascular:     Rate and Rhythm: Normal rate and regular rhythm.     Pulses: Normal pulses.     Heart sounds: Normal heart sounds. No murmur heard. Pulmonary:     Effort: Pulmonary effort is normal. No respiratory distress.     Breath sounds: Normal breath sounds. No wheezing.  Abdominal:     General: Bowel sounds are normal. There is no distension.     Palpations: Abdomen is soft.     Tenderness: There is no abdominal tenderness.  Musculoskeletal:     Cervical back: Normal range of motion.     Right lower leg: No edema.     Left lower leg: No edema.  Lymphadenopathy:     Cervical: No cervical adenopathy.  Skin:    General: Skin is warm and dry.     Capillary Refill: Capillary refill takes less than 2 seconds.  Neurological:     General: No focal deficit present.     Mental Status: She is alert and oriented to person, place, and time.     Motor: Weakness present.     Gait: Gait abnormal.     Comments: walker  Psychiatric:        Mood and Affect: Mood normal.        Behavior: Behavior normal.    Labs reviewed: Recent Labs    06/12/21 1254 07/21/21 0950 08/21/21 1137  NA 138 138 136  K 5.0 4.8 4.7  CL 106 108 105  CO2 23 21* 22  GLUCOSE 88 89 96  BUN 44* 45* 46*  CREATININE 2.04* 2.03* 2.07*  CALCIUM 8.7* 9.5 9.0   Recent Labs    06/12/21 1254 07/21/21 0950 08/21/21 1137  AST 18 17 16   ALT 10 9 10   ALKPHOS 57 51 44  BILITOT 0.3 0.3 0.3  PROT 5.9* 6.5 6.0*  ALBUMIN 2.6* 2.7* 2.7*   Recent Labs    06/12/21 1254 07/21/21 0950 08/21/21 1137  WBC 33.8* 33.9* 23.9*  NEUTROABS 5.8 4.6 5.9  HGB 9.5* 9.5* 9.0*  HCT 29.1* 30.1* 28.6*  MCV 104.7* 109.9* 110.4*  PLT 169 176 178   Lab Results  Component Value Date   TSH 27.99 (A) 09/08/2020   No results found for: HGBA1C No results found for: CHOL,  HDL, LDLCALC,  LDLDIRECT, TRIG, CHOLHDL  Significant Diagnostic Results in last 30 days:  No results found.  Assessment/Plan 1. Acquired hypothyroidism - TSH normal - cont levothyroxine  2. Lymphoma, marginal zone, lymph nodes of multiple sites ([place]) - WBC 23.9 08/21/2021 - followed by oncology - remains on Calquence  3. Anemia in neoplastic disease - hgb 9.011/21/2022, iron 72, TIBC 214, ferritin 08/21/2021,  - remains on monthly epoetin injections  4. Stage 3b chronic kidney disease ([place]) - BUN/creat 46/2.07 08/21/2021 - continue to avoid nephrotoxic drugs like NSAIDS and dose adjust medications to be renally excreted - encourage hydration  5. Bilateral leg edema - no edema today - cont torsemide prn  6. Trigeminal neuralgia - stable with gabapentin  7. Chronic bilateral low back pain without sciatica - improved from last visit - cont tylenol and robaxin  8. Age-related osteoporosis without current pathological fracture - DEXA 2021, tscore -3.8  9. Migraine without aura and with status migrainosus, not intractable - no recent episodes - off propranolol  10. Gait abnormality - no recent falls - ambulates well with walker    Family/ staff Communication: plan discussed with patient and nurse  Labs/tests ordered:  none

## 2021-09-18 ENCOUNTER — Other Ambulatory Visit: Payer: Self-pay

## 2021-09-18 ENCOUNTER — Encounter: Payer: Self-pay | Admitting: Family

## 2021-09-18 ENCOUNTER — Inpatient Hospital Stay: Payer: Medicare Other

## 2021-09-18 ENCOUNTER — Telehealth: Payer: Self-pay | Admitting: *Deleted

## 2021-09-18 ENCOUNTER — Inpatient Hospital Stay: Payer: Medicare Other | Attending: Hematology & Oncology | Admitting: Family

## 2021-09-18 VITALS — BP 79/37 | HR 102 | Temp 97.7°F | Resp 19 | Ht 66.0 in

## 2021-09-18 DIAGNOSIS — D631 Anemia in chronic kidney disease: Secondary | ICD-10-CM | POA: Diagnosis present

## 2021-09-18 DIAGNOSIS — E8809 Other disorders of plasma-protein metabolism, not elsewhere classified: Secondary | ICD-10-CM

## 2021-09-18 DIAGNOSIS — C8588 Other specified types of non-Hodgkin lymphoma, lymph nodes of multiple sites: Secondary | ICD-10-CM

## 2021-09-18 DIAGNOSIS — N1832 Chronic kidney disease, stage 3b: Secondary | ICD-10-CM | POA: Diagnosis not present

## 2021-09-18 DIAGNOSIS — D509 Iron deficiency anemia, unspecified: Secondary | ICD-10-CM

## 2021-09-18 DIAGNOSIS — Z8572 Personal history of non-Hodgkin lymphomas: Secondary | ICD-10-CM | POA: Diagnosis not present

## 2021-09-18 DIAGNOSIS — N183 Chronic kidney disease, stage 3 unspecified: Secondary | ICD-10-CM | POA: Insufficient documentation

## 2021-09-18 DIAGNOSIS — D510 Vitamin B12 deficiency anemia due to intrinsic factor deficiency: Secondary | ICD-10-CM

## 2021-09-18 LAB — RETICULOCYTES
Immature Retic Fract: 11.6 % (ref 2.3–15.9)
RBC.: 2.75 MIL/uL — ABNORMAL LOW (ref 3.87–5.11)
Retic Count, Absolute: 44.8 10*3/uL (ref 19.0–186.0)
Retic Ct Pct: 1.6 % (ref 0.4–3.1)

## 2021-09-18 LAB — CBC WITH DIFFERENTIAL (CANCER CENTER ONLY)
Abs Immature Granulocytes: 0.15 10*3/uL — ABNORMAL HIGH (ref 0.00–0.07)
Basophils Absolute: 0 10*3/uL (ref 0.0–0.1)
Basophils Relative: 0 %
Eosinophils Absolute: 0.1 10*3/uL (ref 0.0–0.5)
Eosinophils Relative: 1 %
HCT: 29.9 % — ABNORMAL LOW (ref 36.0–46.0)
Hemoglobin: 9.6 g/dL — ABNORMAL LOW (ref 12.0–15.0)
Immature Granulocytes: 1 %
Lymphocytes Relative: 66 %
Lymphs Abs: 13.2 10*3/uL — ABNORMAL HIGH (ref 0.7–4.0)
MCH: 35.4 pg — ABNORMAL HIGH (ref 26.0–34.0)
MCHC: 32.1 g/dL (ref 30.0–36.0)
MCV: 110.3 fL — ABNORMAL HIGH (ref 80.0–100.0)
Monocytes Absolute: 0.6 10*3/uL (ref 0.1–1.0)
Monocytes Relative: 3 %
Neutro Abs: 5.8 10*3/uL (ref 1.7–7.7)
Neutrophils Relative %: 29 %
Platelet Count: 170 10*3/uL (ref 150–400)
RBC: 2.71 MIL/uL — ABNORMAL LOW (ref 3.87–5.11)
RDW: 14.4 % (ref 11.5–15.5)
WBC Count: 19.9 10*3/uL — ABNORMAL HIGH (ref 4.0–10.5)
nRBC: 0 % (ref 0.0–0.2)

## 2021-09-18 LAB — CMP (CANCER CENTER ONLY)
ALT: 9 U/L (ref 0–44)
AST: 16 U/L (ref 15–41)
Albumin: 2.7 g/dL — ABNORMAL LOW (ref 3.5–5.0)
Alkaline Phosphatase: 46 U/L (ref 38–126)
Anion gap: 9 (ref 5–15)
BUN: 44 mg/dL — ABNORMAL HIGH (ref 8–23)
CO2: 23 mmol/L (ref 22–32)
Calcium: 9.2 mg/dL (ref 8.9–10.3)
Chloride: 106 mmol/L (ref 98–111)
Creatinine: 2.15 mg/dL — ABNORMAL HIGH (ref 0.44–1.00)
GFR, Estimated: 21 mL/min — ABNORMAL LOW (ref >60)
Glucose, Bld: 89 mg/dL (ref 70–99)
Potassium: 5 mmol/L (ref 3.5–5.1)
Sodium: 138 mmol/L (ref 135–145)
Total Bilirubin: 0.3 mg/dL (ref 0.3–1.2)
Total Protein: 5.9 g/dL — ABNORMAL LOW (ref 6.5–8.1)

## 2021-09-18 LAB — LACTATE DEHYDROGENASE: LDH: 176 U/L (ref 98–192)

## 2021-09-18 LAB — IRON AND TIBC
Iron: 73 ug/dL (ref 41–142)
Saturation Ratios: 32 % (ref 21–57)
TIBC: 230 ug/dL — ABNORMAL LOW (ref 236–444)
UIBC: 157 ug/dL (ref 120–384)

## 2021-09-18 LAB — FERRITIN: Ferritin: 69 ng/mL (ref 11–307)

## 2021-09-18 LAB — SAMPLE TO BLOOD BANK

## 2021-09-18 MED ORDER — EPOETIN ALFA-EPBX 40000 UNIT/ML IJ SOLN
40000.0000 [IU] | Freq: Once | INTRAMUSCULAR | Status: AC
  Administered 2021-09-18: 13:00:00 40000 [IU] via SUBCUTANEOUS
  Filled 2021-09-18: qty 1

## 2021-09-18 NOTE — Telephone Encounter (Signed)
Per 09/18/21 los gave upcoming appointments - confirmed °

## 2021-09-18 NOTE — Progress Notes (Signed)
Hematology and Oncology Follow Up Visit  Joy Lynch [id number] [birth year] 87 y.o. 09/18/2021   Principle Diagnosis:  Marginal Zone lymphoma -- relapsed Anemia secondary to chronic kidney disease, stage III   Past Therapy: Umbralisib 400 mg po q day, started on 10/22/2020 - discontinued by manufacturer    Current Therapy:        Calquence 100 mg PO daily, started 05/25/2021 Retacrit SQ as indicated for Hgb < 11    Interim History:  Joy Lynch is here today with her nephew for follow-up. She is doing well but still notes some fatigue at times.  No blood loss, abnormal bruising or petechiae.  No fever, chills, n/v, cough, rash, dizziness, SOB, chest pain, palpitations, abdominal pain or changes in bowel or bladder habits.  The swelling in her lower extremities has almost completely resolved. Pedal pulses are 2+.  No tenderness, numbness or tingling in her extremities at this time.  No falls or syncope to report.  She has maintained a good appetite and states that she is doing her best to stay well hydrated throughout the day. Her weight is stable at 128 lbs.   ECOG Performance Status: 2 - Symptomatic, <50% confined to bed  Medications:  Allergies as of 09/18/2021       Reactions   Carbamazepine Hives, Other (See Comments)   Turned purple from neck down and drowsy   Penicillins Hives   Pregabalin Other (See Comments)   Drowsy        Medication List        Accurate as of September 18, 2021  1:23 PM. If you have any questions, ask your nurse or doctor.          acetaminophen 500 MG tablet Commonly known as: TYLENOL Take 500 mg by mouth every 6 (six) hours as needed.   aspirin 81 MG chewable tablet Chew 81 mg by mouth daily.   Calquence 100 MG Tabs Generic drug: Acalabrutinib Maleate Take 100 mg by mouth daily.   famciclovir 125 MG tablet Commonly known as: FAMVIR TAKE 2 TABLETS (250MG ) BY MOUTH DAILY   gabapentin 600 MG tablet Commonly known as:  NEURONTIN Take 600 mg by mouth 3 (three) times daily.   lactose free nutrition Liqd Take 237 mLs by mouth daily.   levothyroxine 100 MCG tablet Commonly known as: SYNTHROID Take 100 mcg by mouth daily before breakfast.   methocarbamol 500 MG tablet Commonly known as: ROBAXIN Take 250 mg by mouth daily.   Multi-Vitamins Tabs Take 1 tablet by mouth daily.   polyvinyl alcohol 1.4 % ophthalmic solution Commonly known as: LIQUIFILM TEARS Place 1 drop into both eyes 4 (four) times daily.   Pro-Stat Liqd Take 30 mLs by mouth daily.   sulfamethoxazole-trimethoprim 800-160 MG tablet Commonly known as: BACTRIM DS Take 1 tablet by mouth daily.   torsemide 10 MG tablet Commonly known as: DEMADEX 10 mg daily as needed.   vitamin B-12 1000 MCG tablet Commonly known as: CYANOCOBALAMIN Take 1 tablet (1,000 mcg total) by mouth daily.   white petrolatum ointment Apply topically 2 (two) times daily.   zinc oxide 20 % ointment Apply 1 application topically as needed for irritation. To buttocks after every incontinent episode and as needed for redness. May keep at bedside.        Allergies:  Allergies  Allergen Reactions   Carbamazepine Hives and Other (See Comments)    Turned purple from neck down and drowsy    Penicillins Hives   Pregabalin  Other (See Comments)    Drowsy    Past Medical History, Surgical history, Social history, and Family History were reviewed and updated.  Review of Systems: All other 10 point review of systems is negative.   Physical Exam:  height is 5\' 6"  (1.676 m). Her oral temperature is 97.7 F (36.5 C). Her blood pressure is 79/37 (abnormal) and her pulse is 102 (abnormal). Her respiration is 19 and oxygen saturation is 94%.   Wt Readings from Last 3 Encounters:  09/15/21 128 lb 12.8 oz (58.4 kg)  07/21/21 128 lb 1.9 oz (58.1 kg)  06/01/21 132 lb 9.6 oz (60.1 kg)    Ocular: Sclerae unicteric, pupils equal, round and reactive to  light Ear-nose-throat: Oropharynx clear, dentition fair Lymphatic: No cervical or supraclavicular adenopathy Lungs no rales or rhonchi, good excursion bilaterally Heart regular rate and rhythm, no murmur appreciated Abd soft, nontender, positive bowel sounds MSK no focal spinal tenderness, no joint edema Neuro: non-focal, well-oriented, appropriate affect Breasts: Deferred   Lab Results  Component Value Date   WBC 19.9 (H) 09/18/2021   HGB 9.6 (L) 09/18/2021   HCT 29.9 (L) 09/18/2021   MCV 110.3 (H) 09/18/2021   PLT 170 09/18/2021   Lab Results  Component Value Date   FERRITIN 95 08/21/2021   IRON 72 08/21/2021   TIBC 214 (L) 08/21/2021   UIBC 142 08/21/2021   IRONPCTSAT 34 08/21/2021   Lab Results  Component Value Date   RETICCTPCT 1.6 09/18/2021   RBC 2.75 (L) 09/18/2021   Lab Results  Component Value Date   KPAFRELGTCHN 20.5 (H) 08/21/2021   LAMBDASER 22.4 08/21/2021   KAPLAMBRATIO 0.92 08/21/2021   Lab Results  Component Value Date   IGGSERUM 168 (L) 08/21/2021   IGA 221 08/21/2021   IGMSERUM 1,672 (H) 08/21/2021   No results found for: Odetta Pink, SPEI   Chemistry      Component Value Date/Time   NA 138 09/18/2021 1157   NA 137 11/10/2020 0000   NA 138 08/16/2017 1256   K 5.0 09/18/2021 1157   K 4.3 08/16/2017 1256   CL 106 09/18/2021 1157   CO2 23 09/18/2021 1157   CO2 25 08/16/2017 1256   BUN 44 (H) 09/18/2021 1157   BUN 16 11/10/2020 0000   BUN 19.6 08/16/2017 1256   CREATININE 2.15 (H) 09/18/2021 1157   CREATININE 0.7 08/16/2017 1256   GLU 78 11/10/2020 0000      Component Value Date/Time   CALCIUM 9.2 09/18/2021 1157   CALCIUM 9.2 08/16/2017 1256   ALKPHOS 46 09/18/2021 1157   ALKPHOS 42 08/16/2017 1256   AST 16 09/18/2021 1157   AST 18 08/16/2017 1256   ALT 9 09/18/2021 1157   ALT 11 08/16/2017 1256   BILITOT 0.3 09/18/2021 1157   BILITOT 0.87 08/16/2017 1256        Impression and Plan: Joy Lynch is a very pleasant 87 yo caucasian female with relapsed marginal zone lymphoma and multifactorial anemia.  She will continue her same regimen with Calquence.  ESA given today for Hgb 9.6.  Iron studies pending.  Follow-up in 4 weeks.   Lottie Dawson, NP 12/19/20221:23 PM

## 2021-09-18 NOTE — Patient Instructions (Signed)
Epoetin Alfa injection °What is this medication? °EPOETIN ALFA (e POE e tin AL fa) helps your body make more red blood cells. This medicine is used to treat anemia caused by chronic kidney disease, cancer chemotherapy, or HIV-therapy. It may also be used before surgery if you have anemia. °This medicine may be used for other purposes; ask your health care provider or pharmacist if you have questions. °COMMON BRAND NAME(S): Epogen, Procrit, Retacrit °What should I tell my care team before I take this medication? °They need to know if you have any of these conditions: °cancer °heart disease °high blood pressure °history of blood clots °history of stroke °low levels of folate, iron, or vitamin B12 in the blood °seizures °an unusual or allergic reaction to erythropoietin, albumin, benzyl alcohol, hamster proteins, other medicines, foods, dyes, or preservatives °pregnant or trying to get pregnant °breast-feeding °How should I use this medication? °This medicine is for injection into a vein or under the skin. It is usually given by a health care professional in a hospital or clinic setting. °If you get this medicine at home, you will be taught how to prepare and give this medicine. Use exactly as directed. Take your medicine at regular intervals. Do not take your medicine more often than directed. °It is important that you put your used needles and syringes in a special sharps container. Do not put them in a trash can. If you do not have a sharps container, call your pharmacist or healthcare provider to get one. °A special MedGuide will be given to you by the pharmacist with each prescription and refill. Be sure to read this information carefully each time. °Talk to your pediatrician regarding the use of this medicine in children. While this drug may be prescribed for selected conditions, precautions do apply. °Overdosage: If you think you have taken too much of this medicine contact a poison control center or emergency  room at once. °NOTE: This medicine is only for you. Do not share this medicine with others. °What if I miss a dose? °If you miss a dose, take it as soon as you can. If it is almost time for your next dose, take only that dose. Do not take double or extra doses. °What may interact with this medication? °Interactions have not been studied. °This list may not describe all possible interactions. Give your health care provider a list of all the medicines, herbs, non-prescription drugs, or dietary supplements you use. Also tell them if you smoke, drink alcohol, or use illegal drugs. Some items may interact with your medicine. °What should I watch for while using this medication? °Your condition will be monitored carefully while you are receiving this medicine. °You may need blood work done while you are taking this medicine. °This medicine may cause a decrease in vitamin B6. You should make sure that you get enough vitamin B6 while you are taking this medicine. Discuss the foods you eat and the vitamins you take with your health care professional. °What side effects may I notice from receiving this medication? °Side effects that you should report to your doctor or health care professional as soon as possible: °allergic reactions like skin rash, itching or hives, swelling of the face, lips, or tongue °seizures °signs and symptoms of a blood clot such as breathing problems; changes in vision; chest pain; severe, sudden headache; pain, swelling, warmth in the leg; trouble speaking; sudden numbness or weakness of the face, arm or leg °signs and symptoms of a stroke like   changes in vision; confusion; trouble speaking or understanding; severe headaches; sudden numbness or weakness of the face, arm or leg; trouble walking; dizziness; loss of balance or coordination °Side effects that usually do not require medical attention (report to your doctor or health care professional if they continue or are  bothersome): °chills °cough °dizziness °fever °headaches °joint pain °muscle cramps °muscle pain °nausea, vomiting °pain, redness, or irritation at site where injected °This list may not describe all possible side effects. Call your doctor for medical advice about side effects. You may report side effects to FDA at 1-800-FDA-1088. °Where should I keep my medication? °Keep out of the reach of children. °Store in a refrigerator between 2 and 8 degrees C (36 and 46 degrees F). Do not freeze or shake. Throw away any unused portion if using a single-dose vial. Multi-dose vials can be kept in the refrigerator for up to 21 days after the initial dose. Throw away unused medicine. °NOTE: This sheet is a summary. It may not cover all possible information. If you have questions about this medicine, talk to your doctor, pharmacist, or health care provider. °© 2022 Elsevier/Gold Standard (2017-05-21 00:00:00) ° °

## 2021-09-19 LAB — KAPPA/LAMBDA LIGHT CHAINS
Kappa free light chain: 20.4 mg/L — ABNORMAL HIGH (ref 3.3–19.4)
Kappa, lambda light chain ratio: 0.97 (ref 0.26–1.65)
Lambda free light chains: 21.1 mg/L (ref 5.7–26.3)

## 2021-09-19 LAB — BETA 2 MICROGLOBULIN, SERUM: Beta-2 Microglobulin: 6.9 mg/L — ABNORMAL HIGH (ref 0.6–2.4)

## 2021-10-16 ENCOUNTER — Inpatient Hospital Stay: Payer: Medicare Other

## 2021-10-16 ENCOUNTER — Inpatient Hospital Stay (HOSPITAL_BASED_OUTPATIENT_CLINIC_OR_DEPARTMENT_OTHER): Payer: Medicare Other | Admitting: Family

## 2021-10-16 ENCOUNTER — Encounter: Payer: Self-pay | Admitting: Family

## 2021-10-16 ENCOUNTER — Other Ambulatory Visit: Payer: Self-pay

## 2021-10-16 ENCOUNTER — Inpatient Hospital Stay: Payer: Medicare Other | Attending: Hematology & Oncology

## 2021-10-16 VITALS — BP 74/45 | HR 100 | Temp 97.6°F | Resp 19 | Ht 66.0 in

## 2021-10-16 DIAGNOSIS — D631 Anemia in chronic kidney disease: Secondary | ICD-10-CM | POA: Insufficient documentation

## 2021-10-16 DIAGNOSIS — Z79899 Other long term (current) drug therapy: Secondary | ICD-10-CM | POA: Insufficient documentation

## 2021-10-16 DIAGNOSIS — C8588 Other specified types of non-Hodgkin lymphoma, lymph nodes of multiple sites: Secondary | ICD-10-CM

## 2021-10-16 DIAGNOSIS — N1832 Chronic kidney disease, stage 3b: Secondary | ICD-10-CM

## 2021-10-16 DIAGNOSIS — N183 Chronic kidney disease, stage 3 unspecified: Secondary | ICD-10-CM | POA: Diagnosis not present

## 2021-10-16 DIAGNOSIS — Z8572 Personal history of non-Hodgkin lymphomas: Secondary | ICD-10-CM | POA: Diagnosis not present

## 2021-10-16 DIAGNOSIS — D509 Iron deficiency anemia, unspecified: Secondary | ICD-10-CM | POA: Diagnosis not present

## 2021-10-16 DIAGNOSIS — D510 Vitamin B12 deficiency anemia due to intrinsic factor deficiency: Secondary | ICD-10-CM

## 2021-10-16 DIAGNOSIS — E8809 Other disorders of plasma-protein metabolism, not elsewhere classified: Secondary | ICD-10-CM

## 2021-10-16 LAB — CBC WITH DIFFERENTIAL (CANCER CENTER ONLY)
Abs Immature Granulocytes: 0.63 10*3/uL — ABNORMAL HIGH (ref 0.00–0.07)
Basophils Absolute: 0 10*3/uL (ref 0.0–0.1)
Basophils Relative: 0 %
Eosinophils Absolute: 0.1 10*3/uL (ref 0.0–0.5)
Eosinophils Relative: 1 %
HCT: 30.6 % — ABNORMAL LOW (ref 36.0–46.0)
Hemoglobin: 9.8 g/dL — ABNORMAL LOW (ref 12.0–15.0)
Immature Granulocytes: 5 %
Lymphocytes Relative: 64 %
Lymphs Abs: 8.5 10*3/uL — ABNORMAL HIGH (ref 0.7–4.0)
MCH: 34.8 pg — ABNORMAL HIGH (ref 26.0–34.0)
MCHC: 31.8 g/dL (ref 30.0–36.0)
MCV: 109.4 fL — ABNORMAL HIGH (ref 80.0–100.0)
Monocytes Absolute: 0.4 10*3/uL (ref 0.1–1.0)
Monocytes Relative: 3 %
Neutro Abs: 3.6 10*3/uL (ref 1.7–7.7)
Neutrophils Relative %: 27 %
Platelet Count: 161 10*3/uL (ref 150–400)
RBC: 2.76 MIL/uL — ABNORMAL LOW (ref 3.87–5.11)
RDW: 13.9 % (ref 11.5–15.5)
WBC Count: 13.3 10*3/uL — ABNORMAL HIGH (ref 4.0–10.5)
nRBC: 0 % (ref 0.0–0.2)

## 2021-10-16 LAB — CMP (CANCER CENTER ONLY)
ALT: 9 U/L (ref 0–44)
AST: 18 U/L (ref 15–41)
Albumin: 2.9 g/dL — ABNORMAL LOW (ref 3.5–5.0)
Alkaline Phosphatase: 43 U/L (ref 38–126)
Anion gap: 9 (ref 5–15)
BUN: 46 mg/dL — ABNORMAL HIGH (ref 8–23)
CO2: 23 mmol/L (ref 22–32)
Calcium: 9.1 mg/dL (ref 8.9–10.3)
Chloride: 106 mmol/L (ref 98–111)
Creatinine: 2.16 mg/dL — ABNORMAL HIGH (ref 0.44–1.00)
GFR, Estimated: 21 mL/min — ABNORMAL LOW (ref >60)
Glucose, Bld: 94 mg/dL (ref 70–99)
Potassium: 4.9 mmol/L (ref 3.5–5.1)
Sodium: 138 mmol/L (ref 135–145)
Total Bilirubin: 0.3 mg/dL (ref 0.3–1.2)
Total Protein: 5.9 g/dL — ABNORMAL LOW (ref 6.5–8.1)

## 2021-10-16 LAB — VITAMIN B12: Vitamin B-12: 1874 pg/mL — ABNORMAL HIGH (ref 180–914)

## 2021-10-16 LAB — RETICULOCYTES
Immature Retic Fract: 10.4 % (ref 2.3–15.9)
RBC.: 2.79 MIL/uL — ABNORMAL LOW (ref 3.87–5.11)
Retic Count, Absolute: 47.2 10*3/uL (ref 19.0–186.0)
Retic Ct Pct: 1.7 % (ref 0.4–3.1)

## 2021-10-16 LAB — LACTATE DEHYDROGENASE: LDH: 233 U/L — ABNORMAL HIGH (ref 98–192)

## 2021-10-16 LAB — SAMPLE TO BLOOD BANK

## 2021-10-16 MED ORDER — EPOETIN ALFA-EPBX 40000 UNIT/ML IJ SOLN
40000.0000 [IU] | Freq: Once | INTRAMUSCULAR | Status: AC
  Administered 2021-10-16: 40000 [IU] via SUBCUTANEOUS
  Filled 2021-10-16: qty 1

## 2021-10-16 NOTE — Progress Notes (Signed)
Hematology and Oncology Follow Up Visit  Joy Lynch [id number] 18-Mar-1926 88 y.o. 10/16/2021   Principle Diagnosis:  Marginal Zone lymphoma -- relapsed Anemia secondary to chronic kidney disease, stage III   Past Therapy: Umbralisib 400 mg po q day, started on 10/22/2020 - discontinued by manufacturer    Current Therapy:        Calquence 100 mg PO daily, started 05/25/2021 Retacrit SQ as indicated for Hgb < 11    Interim History:  Joy Lynch is here today with her nephew for follow-up and injection.  Hgb is 9.8. BP is 74/45. She has fatigue at times.  She denies fever, chills, n/v, cough, rash, dizziness, chest pain, palpitations, abdominal pain or changes in bowel or bladder habits.  Minimal puffiness around the ankles. No tenderness, numbness or tingling in her extremities at this time.  No falls or syncope to report. She goes to exercise classes with PT during the week.  She has a good appetite but admits that she needs to better hydrate throughout the day. She did not stand for a weight today.   ECOG Performance Status: 1 - Symptomatic but completely ambulatory  Medications:  Allergies as of 10/16/2021       Reactions   Carbamazepine Hives, Other (See Comments)   Turned purple from neck down and drowsy   Penicillins Hives   Pregabalin Other (See Comments)   Drowsy        Medication List        Accurate as of October 16, 2021 12:24 PM. If you have any questions, ask your nurse or doctor.          acetaminophen 500 MG tablet Commonly known as: TYLENOL Take 500 mg by mouth every 6 (six) hours as needed.   aspirin 81 MG chewable tablet Chew 81 mg by mouth daily.   Calquence 100 MG Tabs Generic drug: Acalabrutinib Maleate Take 100 mg by mouth daily.   famciclovir 125 MG tablet Commonly known as: FAMVIR TAKE 2 TABLETS (250MG ) BY MOUTH DAILY   gabapentin 600 MG tablet Commonly known as: NEURONTIN Take 600 mg by mouth 3 (three) times daily.   lactose  free nutrition Liqd Take 237 mLs by mouth daily.   levothyroxine 100 MCG tablet Commonly known as: SYNTHROID Take 100 mcg by mouth daily before breakfast.   methocarbamol 500 MG tablet Commonly known as: ROBAXIN Take 250 mg by mouth daily.   Multi-Vitamins Tabs Take 1 tablet by mouth daily.   polyvinyl alcohol 1.4 % ophthalmic solution Commonly known as: LIQUIFILM TEARS Place 1 drop into both eyes 4 (four) times daily.   Pro-Stat Liqd Take 30 mLs by mouth daily.   sulfamethoxazole-trimethoprim 800-160 MG tablet Commonly known as: BACTRIM DS Take 1 tablet by mouth daily.   torsemide 10 MG tablet Commonly known as: DEMADEX 10 mg daily as needed.   vitamin B-12 1000 MCG tablet Commonly known as: CYANOCOBALAMIN Take 1 tablet (1,000 mcg total) by mouth daily.   white petrolatum ointment Apply topically 2 (two) times daily.   zinc oxide 20 % ointment Apply 1 application topically as needed for irritation. To buttocks after every incontinent episode and as needed for redness. May keep at bedside.        Allergies:  Allergies  Allergen Reactions   Carbamazepine Hives and Other (See Comments)    Turned purple from neck down and drowsy    Penicillins Hives   Pregabalin Other (See Comments)    Drowsy    Past  Medical History, Surgical history, Social history, and Family History were reviewed and updated.  Review of Systems: All other 10 point review of systems is negative.   Physical Exam:  height is 5\' 6"  (1.676 m). Her oral temperature is 97.6 F (36.4 C). Her blood pressure is 74/45 (abnormal) and her pulse is 100. Her respiration is 19 and oxygen saturation is 98%.   Wt Readings from Last 3 Encounters:  09/15/21 128 lb 12.8 oz (58.4 kg)  07/21/21 128 lb 1.9 oz (58.1 kg)  06/01/21 132 lb 9.6 oz (60.1 kg)    Ocular: Sclerae unicteric, pupils equal, round and reactive to light Ear-nose-throat: Oropharynx clear, dentition fair Lymphatic: No cervical or  supraclavicular adenopathy Lungs no rales or rhonchi, good excursion bilaterally Heart regular rate and rhythm, no murmur appreciated Abd soft, nontender, positive bowel sounds MSK no focal spinal tenderness, no joint edema Neuro: non-focal, well-oriented, appropriate affect Breasts: Deferred   Lab Results  Component Value Date   WBC 13.3 (H) 10/16/2021   HGB 9.8 (L) 10/16/2021   HCT 30.6 (L) 10/16/2021   MCV 109.4 (H) 10/16/2021   PLT 161 10/16/2021   Lab Results  Component Value Date   FERRITIN 69 09/18/2021   IRON 73 09/18/2021   TIBC 230 (L) 09/18/2021   UIBC 157 09/18/2021   IRONPCTSAT 32 09/18/2021   Lab Results  Component Value Date   RETICCTPCT 1.7 10/16/2021   RBC 2.79 (L) 10/16/2021   Lab Results  Component Value Date   KPAFRELGTCHN 20.4 (H) 09/18/2021   LAMBDASER 21.1 09/18/2021   KAPLAMBRATIO 0.97 09/18/2021   Lab Results  Component Value Date   IGGSERUM 168 (L) 08/21/2021   IGA 221 08/21/2021   IGMSERUM 1,672 (H) 08/21/2021   No results found for: Odetta Pink, SPEI   Chemistry      Component Value Date/Time   NA 138 10/16/2021 1126   NA 137 11/10/2020 0000   NA 138 08/16/2017 1256   K 4.9 10/16/2021 1126   K 4.3 08/16/2017 1256   CL 106 10/16/2021 1126   CO2 23 10/16/2021 1126   CO2 25 08/16/2017 1256   BUN 46 (H) 10/16/2021 1126   BUN 16 11/10/2020 0000   BUN 19.6 08/16/2017 1256   CREATININE 2.16 (H) 10/16/2021 1126   CREATININE 0.7 08/16/2017 1256   GLU 78 11/10/2020 0000      Component Value Date/Time   CALCIUM 9.1 10/16/2021 1126   CALCIUM 9.2 08/16/2017 1256   ALKPHOS 43 10/16/2021 1126   ALKPHOS 42 08/16/2017 1256   AST 18 10/16/2021 1126   AST 18 08/16/2017 1256   ALT 9 10/16/2021 1126   ALT 11 08/16/2017 1256   BILITOT 0.3 10/16/2021 1126   BILITOT 0.87 08/16/2017 1256       Impression and Plan: Joy Lynch is a very pleasant 88 yo caucasian female with relapsed  marginal zone lymphoma and multifactorial anemia.  She continues to tolerate Calquence nicely.  She will work on better hydrating throughout the day.  ESA given for Hgb 9.8.  Follow-up in 1 month.   Lottie Dawson, NP 1/16/202312:24 PM

## 2021-10-17 LAB — BETA 2 MICROGLOBULIN, SERUM: Beta-2 Microglobulin: 6.8 mg/L — ABNORMAL HIGH (ref 0.6–2.4)

## 2021-10-17 LAB — KAPPA/LAMBDA LIGHT CHAINS
Kappa free light chain: 21.1 mg/L — ABNORMAL HIGH (ref 3.3–19.4)
Kappa, lambda light chain ratio: 1.02 (ref 0.26–1.65)
Lambda free light chains: 20.6 mg/L (ref 5.7–26.3)

## 2021-10-17 LAB — FERRITIN: Ferritin: 84 ng/mL (ref 11–307)

## 2021-10-18 ENCOUNTER — Telehealth: Payer: Self-pay

## 2021-10-18 NOTE — Telephone Encounter (Signed)
Called and LM informing pt's nephew Gwyndolyn Saxon (ok per DPR) of lab results, requested a CB with any questions or concerns.

## 2021-10-18 NOTE — Telephone Encounter (Signed)
-----   Message from Celso Amy, NP sent at 10/18/2021  8:57 AM EST ----- She can cut back on the B 12. Maybe take every 3 days.    ----- Message ----- From: Buel Ream, Lab [facility] [place] Sent: 10/16/2021  11:59 AM EST To: Celso Amy, NP

## 2021-10-24 ENCOUNTER — Telehealth: Payer: Self-pay

## 2021-10-24 NOTE — Telephone Encounter (Signed)
Fax received from [facility], pharmacist who rounds at facility inquiring about patient being on bactrim and famvir, if dose needs reduced due to renal impairment. Called [facility] and confirmed patient dosage, bactrim 800---160mg  daily and famvir 250mg  daily. Dr.Gupta is currently prescribing these as she is her physician there are the facility, but orginial prescriber is Dr.Ennever. Per Dr.Ennever pt to d/c bactrim but continue famvir 250mg  daily. Message sent to Dr.Gupta with orders and to Corlis Leak, Pharm D at [facility].

## 2021-11-09 ENCOUNTER — Encounter: Payer: Self-pay | Admitting: Internal Medicine

## 2021-11-09 ENCOUNTER — Non-Acute Institutional Stay: Payer: Medicare Other | Admitting: Internal Medicine

## 2021-11-09 DIAGNOSIS — L03818 Cellulitis of other sites: Secondary | ICD-10-CM | POA: Diagnosis not present

## 2021-11-09 DIAGNOSIS — N184 Chronic kidney disease, stage 4 (severe): Secondary | ICD-10-CM

## 2021-11-09 DIAGNOSIS — K219 Gastro-esophageal reflux disease without esophagitis: Secondary | ICD-10-CM

## 2021-11-09 DIAGNOSIS — R600 Localized edema: Secondary | ICD-10-CM | POA: Diagnosis not present

## 2021-11-09 DIAGNOSIS — D631 Anemia in chronic kidney disease: Secondary | ICD-10-CM

## 2021-11-09 DIAGNOSIS — G500 Trigeminal neuralgia: Secondary | ICD-10-CM

## 2021-11-09 DIAGNOSIS — C8588 Other specified types of non-Hodgkin lymphoma, lymph nodes of multiple sites: Secondary | ICD-10-CM

## 2021-11-09 NOTE — Progress Notes (Signed)
Location: [facility] Room Number: 9 Place of Service:  ALF [phone])  Provider: Veleta Miners MD  Code Status: Full Code Managed Care Goals of Care:  Advanced Directives 11/09/2021  Does Patient Have a Medical Advance Directive? Yes  Type of Paramedic of [place];Living will  Does patient want to make changes to medical advance directive? No - Patient declined  Copy of [place] in Chart? Yes - validated most recent copy scanned in chart (See row information)  Would patient like information on creating a medical advance directive? -     Chief Complaint  Patient presents with   Acute Visit    Reflux    HPI: Patient is a 88 y.o. female seen today for an acute visit for Sore on the Toe and Reflux Like Symptoms  Patient has a history of Relapsed Marginal Zone  lymphoma followed by oncology now on Oral therapy  osteoporosis,   hypothyroidism,  trigeminal neuralgia and migraines   H/o  frontoparietal subarachnoid hemorrhage and Rib fractures LE edema  C/O GERD Reflux Especially after eating Lunch and supper Denis any Nausea or Vomiting No Abdominal pain  2nD Toe in Right leg Has Lesion on top of the toe which has become Red and Painful for past Few days No Discharge  Does have Bunion in Great toe   Past Medical History:  Diagnosis Date   Cancer ([facility])    non hodgkin lymphoma   Chronic lymphocytic leukemia ([place])    History of B-cell lymphoma 11/02/2015   Migraines    Osteoporosis    Thyroid disease    Trigeminal neuralgia of left side of face     Past Surgical History:  Procedure Laterality Date   gamma knife     for trigeminal neuralgia   SHOULDER SURGERY     Right shoulder replacement   TONSILLECTOMY AND ADENOIDECTOMY     WISDOM TOOTH EXTRACTION      Allergies  Allergen Reactions   Carbamazepine Hives and Other (See Comments)    Turned purple from neck down and drowsy    Penicillins Hives    Pregabalin Other (See Comments)    Drowsy    Outpatient Encounter Medications as of 11/09/2021  Medication Sig   Acalabrutinib Maleate (CALQUENCE) 100 MG TABS Take 100 mg by mouth daily.   acetaminophen (TYLENOL) 500 MG tablet Take 500 mg by mouth every 6 (six) hours as needed.   Amino Acids-Protein Hydrolys (PRO-STAT) LIQD Take 30 mLs by mouth daily.   aspirin 81 MG chewable tablet Chew 81 mg by mouth daily.   famciclovir (FAMVIR) 125 MG tablet Take 250 mg by mouth daily. Two tablets   gabapentin (NEURONTIN) 600 MG tablet Take 600 mg by mouth 3 (three) times daily.   lactose free nutrition (BOOST) LIQD Take 237 mLs by mouth daily.   levothyroxine (SYNTHROID) 100 MCG tablet Take 112 mcg by mouth daily before breakfast.   methocarbamol (ROBAXIN) 500 MG tablet Take 250 mg by mouth daily.   Multiple Vitamin (MULTI-VITAMINS) TABS Take 1 tablet by mouth daily.   polyvinyl alcohol (LIQUIFILM TEARS) 1.4 % ophthalmic solution Place 1 drop into both eyes 4 (four) times daily.   torsemide (DEMADEX) 10 MG tablet 10 mg daily as needed.   vitamin B-12 (CYANOCOBALAMIN) 1000 MCG tablet Take 1 tablet (1,000 mcg total) by mouth daily.   white petrolatum ointment Apply topically 2 (two) times daily.   zinc oxide 20 % ointment Apply 1  application topically as needed for irritation. To buttocks after every incontinent episode and as needed for redness. May keep at bedside.   [DISCONTINUED] sulfamethoxazole-trimethoprim (BACTRIM DS) 800-160 MG tablet Take 1 tablet by mouth daily.   No facility-administered encounter medications on file as of 11/09/2021.    Review of Systems:  Review of Systems  Constitutional:  Negative for activity change and appetite change.  HENT: Negative.    Respiratory:  Negative for cough and shortness of breath.   Cardiovascular:  Positive for leg swelling.  Gastrointestinal:  Negative for abdominal pain and constipation.  Genitourinary: Negative.   Musculoskeletal:  Positive for  gait problem. Negative for arthralgias and myalgias.  Skin: Negative.   Neurological:  Negative for dizziness and weakness.  Psychiatric/Behavioral:  Negative for confusion, dysphoric mood and sleep disturbance.    Health Maintenance  Topic Date Due   Zoster Vaccines- Shingrix (2 of 2) 08/26/2018   COVID-19 Vaccine (5 - Booster for Moderna series) 08/16/2021   TETANUS/TDAP  12/14/2030   Pneumonia Vaccine 26+ Years old  Completed   INFLUENZA VACCINE  Completed   DEXA SCAN  Completed   HPV VACCINES  Aged Out    Physical Exam: Vitals:   11/09/21 1508  BP: 118/73  Pulse: 99  Resp: 16  Temp: 98.2 F (36.8 C)  SpO2: 95%  Weight: 131 lb 12.8 oz (59.8 kg)  Height: 5\' 6"  (1.676 m)   Body mass index is 21.27 kg/m. Physical Exam Vitals reviewed.  Constitutional:      Appearance: Normal appearance.  HENT:     Head: Normocephalic.     Nose: Nose normal.     Mouth/Throat:     Mouth: Mucous membranes are moist.     Pharynx: Oropharynx is clear.  Eyes:     Pupils: Pupils are equal, round, and reactive to light.  Cardiovascular:     Rate and Rhythm: Normal rate and regular rhythm.     Pulses: Normal pulses.     Heart sounds: Normal heart sounds. No murmur heard. Pulmonary:     Effort: Pulmonary effort is normal.     Breath sounds: Normal breath sounds.  Abdominal:     General: Abdomen is flat. Bowel sounds are normal.     Palpations: Abdomen is soft.  Musculoskeletal:        General: No swelling.     Cervical back: Neck supple.     Comments: Small Pressure wound on Top of Right 2nd toe with redness , Tender, Boggy No Discharge   Skin:    General: Skin is warm.  Neurological:     General: No focal deficit present.     Mental Status: She is alert and oriented to person, place, and time.  Psychiatric:        Mood and Affect: Mood normal.        Thought Content: Thought content normal.    Labs reviewed: Basic Metabolic Panel: Recent Labs    08/21/21 1137  09/18/21 1157 10/16/21 1126  NA 136 138 138  K 4.7 5.0 4.9  CL 105 106 106  CO2 22 23 23   GLUCOSE 96 89 94  BUN 46* 44* 46*  CREATININE 2.07* 2.15* 2.16*  CALCIUM 9.0 9.2 9.1   Liver Function Tests: Recent Labs    08/21/21 1137 09/18/21 1157 10/16/21 1126  AST 16 16 18   ALT 10 9 9   ALKPHOS 44 46 43  BILITOT 0.3 0.3 0.3  PROT 6.0* 5.9* 5.9*  ALBUMIN 2.7* 2.7*  2.9*   No results for input(s): LIPASE, AMYLASE in the last 8760 hours. No results for input(s): AMMONIA in the last 8760 hours. CBC: Recent Labs    08/21/21 1137 09/18/21 1157 10/16/21 1126  WBC 23.9* 19.9* 13.3*  NEUTROABS 5.9 5.8 3.6  HGB 9.0* 9.6* 9.8*  HCT 28.6* 29.9* 30.6*  MCV 110.4* 110.3* 109.4*  PLT 178 170 161   Lipid Panel: No results for input(s): CHOL, HDL, LDLCALC, TRIG, CHOLHDL, LDLDIRECT in the last 8760 hours. No results found for: HGBA1C  Procedures since last visit: No results found.  Assessment/Plan Cellulitis of Right 2nd Toe Doxycyline 100 mg BID for 7 days Gastroesophageal reflux disease,  Start on Protonix 40 mg QD   Lymphoma, marginal zone, lymph nodes of multiple sites (HCC) Stable on Calquence Also on Famciclovir Bilateral leg edema On Torsemide PRn CKD (chronic kidney disease) stage 4, GFR 15-29 ml/min (HCC) Creat stable Anemia due to stage 4 chronic kidney disease (HCC) Hgb stable Hypthyroidism TSH normal in 12/22 Done in Facility  Labs/tests ordered:  * No order type specified * Next appt:  Visit date not found

## 2021-11-16 ENCOUNTER — Inpatient Hospital Stay: Payer: Medicare Other

## 2021-11-16 ENCOUNTER — Inpatient Hospital Stay: Payer: Medicare Other | Attending: Hematology & Oncology

## 2021-11-16 ENCOUNTER — Other Ambulatory Visit: Payer: Self-pay

## 2021-11-16 ENCOUNTER — Encounter: Payer: Self-pay | Admitting: Hematology & Oncology

## 2021-11-16 ENCOUNTER — Inpatient Hospital Stay (HOSPITAL_BASED_OUTPATIENT_CLINIC_OR_DEPARTMENT_OTHER): Payer: Medicare Other | Admitting: Hematology & Oncology

## 2021-11-16 ENCOUNTER — Telehealth: Payer: Self-pay | Admitting: *Deleted

## 2021-11-16 VITALS — BP 86/40 | HR 106 | Temp 97.3°F | Resp 19 | Ht 66.0 in | Wt 130.0 lb

## 2021-11-16 DIAGNOSIS — N1832 Chronic kidney disease, stage 3b: Secondary | ICD-10-CM

## 2021-11-16 DIAGNOSIS — C8588 Other specified types of non-Hodgkin lymphoma, lymph nodes of multiple sites: Secondary | ICD-10-CM

## 2021-11-16 DIAGNOSIS — D630 Anemia in neoplastic disease: Secondary | ICD-10-CM

## 2021-11-16 DIAGNOSIS — D631 Anemia in chronic kidney disease: Secondary | ICD-10-CM | POA: Insufficient documentation

## 2021-11-16 DIAGNOSIS — N183 Chronic kidney disease, stage 3 unspecified: Secondary | ICD-10-CM | POA: Insufficient documentation

## 2021-11-16 DIAGNOSIS — Z8572 Personal history of non-Hodgkin lymphomas: Secondary | ICD-10-CM

## 2021-11-16 DIAGNOSIS — D509 Iron deficiency anemia, unspecified: Secondary | ICD-10-CM

## 2021-11-16 DIAGNOSIS — E8809 Other disorders of plasma-protein metabolism, not elsewhere classified: Secondary | ICD-10-CM

## 2021-11-16 DIAGNOSIS — D510 Vitamin B12 deficiency anemia due to intrinsic factor deficiency: Secondary | ICD-10-CM

## 2021-11-16 LAB — RETICULOCYTES
Immature Retic Fract: 11.4 % (ref 2.3–15.9)
RBC.: 2.81 MIL/uL — ABNORMAL LOW (ref 3.87–5.11)
Retic Count, Absolute: 48.9 10*3/uL (ref 19.0–186.0)
Retic Ct Pct: 1.7 % (ref 0.4–3.1)

## 2021-11-16 LAB — SAMPLE TO BLOOD BANK

## 2021-11-16 LAB — CMP (CANCER CENTER ONLY)
ALT: 8 U/L (ref 0–44)
AST: 17 U/L (ref 15–41)
Albumin: 2.6 g/dL — ABNORMAL LOW (ref 3.5–5.0)
Alkaline Phosphatase: 45 U/L (ref 38–126)
Anion gap: 8 (ref 5–15)
BUN: 40 mg/dL — ABNORMAL HIGH (ref 8–23)
CO2: 24 mmol/L (ref 22–32)
Calcium: 8.5 mg/dL — ABNORMAL LOW (ref 8.9–10.3)
Chloride: 106 mmol/L (ref 98–111)
Creatinine: 1.78 mg/dL — ABNORMAL HIGH (ref 0.44–1.00)
GFR, Estimated: 26 mL/min — ABNORMAL LOW (ref >60)
Glucose, Bld: 103 mg/dL — ABNORMAL HIGH (ref 70–99)
Potassium: 4.1 mmol/L (ref 3.5–5.1)
Sodium: 138 mmol/L (ref 135–145)
Total Bilirubin: 0.3 mg/dL (ref 0.3–1.2)
Total Protein: 5.6 g/dL — ABNORMAL LOW (ref 6.5–8.1)

## 2021-11-16 LAB — CBC WITH DIFFERENTIAL (CANCER CENTER ONLY)
Abs Immature Granulocytes: 0.36 10*3/uL — ABNORMAL HIGH (ref 0.00–0.07)
Basophils Absolute: 0.1 10*3/uL (ref 0.0–0.1)
Basophils Relative: 0 %
Eosinophils Absolute: 0.2 10*3/uL (ref 0.0–0.5)
Eosinophils Relative: 1 %
HCT: 29.9 % — ABNORMAL LOW (ref 36.0–46.0)
Hemoglobin: 9.6 g/dL — ABNORMAL LOW (ref 12.0–15.0)
Immature Granulocytes: 3 %
Lymphocytes Relative: 65 %
Lymphs Abs: 7.9 10*3/uL — ABNORMAL HIGH (ref 0.7–4.0)
MCH: 34.4 pg — ABNORMAL HIGH (ref 26.0–34.0)
MCHC: 32.1 g/dL (ref 30.0–36.0)
MCV: 107.2 fL — ABNORMAL HIGH (ref 80.0–100.0)
Monocytes Absolute: 0.6 10*3/uL (ref 0.1–1.0)
Monocytes Relative: 5 %
Neutro Abs: 3.2 10*3/uL (ref 1.7–7.7)
Neutrophils Relative %: 26 %
Platelet Count: 153 10*3/uL (ref 150–400)
RBC: 2.79 MIL/uL — ABNORMAL LOW (ref 3.87–5.11)
RDW: 13.9 % (ref 11.5–15.5)
Smear Review: NORMAL
WBC Count: 12.2 10*3/uL — ABNORMAL HIGH (ref 4.0–10.5)
nRBC: 0 % (ref 0.0–0.2)

## 2021-11-16 LAB — LACTATE DEHYDROGENASE: LDH: 246 U/L — ABNORMAL HIGH (ref 98–192)

## 2021-11-16 MED ORDER — EPOETIN ALFA-EPBX 40000 UNIT/ML IJ SOLN
40000.0000 [IU] | Freq: Once | INTRAMUSCULAR | Status: AC
  Administered 2021-11-16: 40000 [IU] via SUBCUTANEOUS
  Filled 2021-11-16: qty 1

## 2021-11-16 NOTE — Progress Notes (Signed)
Hematology and Oncology Follow Up Visit  Joy Lynch [id number] January 02, 1926 88 y.o. 11/16/2021   Principle Diagnosis:  Marginal Zone lymphoma -- relapsed Anemia secondary to chronic kidney disease, stage III   Past Therapy: Umbralisib 400 mg po q day, started on 10/22/2020 - discontinued by manufacturer    Current Therapy:        Calquence 100 mg PO daily, started 05/25/2021 Retacrit SQ as indicated for Hgb < 11    Interim History:  Ms. Joy Lynch is here today with her nephew for follow-up and injection.  So far, she is doing quite well with the Calquence.  Her white cell count continues to come down.  Her hemoglobin is holding steady.  She lives a [facility].  She is really enjoying it there.  They had quite a few Valentines parties.  She has had no issues with nausea or vomiting.  She has had no change in bowel or bladder habits.  She has had no swollen lymph nodes.  She did rub her right foot.  She did have an excoriation on the big toe.  There is no infection.  She has had no problems with rashes.  There is no bleeding.  Overall, her performance status is ECOG 3.    Medications:  Allergies as of 11/16/2021       Reactions   Carbamazepine Hives, Other (See Comments)   Turned purple from neck down and drowsy   Penicillins Hives   Pregabalin Other (See Comments)   Drowsy        Medication List        Accurate as of November 16, 2021 11:21 AM. If you have any questions, ask your nurse or doctor.          acetaminophen 500 MG tablet Commonly known as: TYLENOL Take 500 mg by mouth every 6 (six) hours as needed.   aspirin 81 MG chewable tablet Chew 81 mg by mouth daily.   Calquence 100 MG Tabs Generic drug: Acalabrutinib Maleate Take 100 mg by mouth daily.   famciclovir 125 MG tablet Commonly known as: FAMVIR Take 250 mg by mouth daily. Two tablets   gabapentin 600 MG tablet Commonly known as: NEURONTIN Take 600 mg by mouth 3 (three) times daily.    lactose free nutrition Liqd Take 237 mLs by mouth daily.   levothyroxine 100 MCG tablet Commonly known as: SYNTHROID Take 112 mcg by mouth daily before breakfast.   methocarbamol 500 MG tablet Commonly known as: ROBAXIN Take 250 mg by mouth daily.   Multi-Vitamins Tabs Take 1 tablet by mouth daily.   pantoprazole 40 MG tablet Commonly known as: PROTONIX Take 40 mg by mouth daily.   polyvinyl alcohol 1.4 % ophthalmic solution Commonly known as: LIQUIFILM TEARS Place 1 drop into both eyes 4 (four) times daily.   Pro-Stat Liqd Take 30 mLs by mouth daily.   torsemide 10 MG tablet Commonly known as: DEMADEX 10 mg daily as needed.   vitamin B-12 1000 MCG tablet Commonly known as: CYANOCOBALAMIN Take 1 tablet (1,000 mcg total) by mouth daily.   white petrolatum ointment Apply topically 2 (two) times daily.   zinc oxide 20 % ointment Apply 1 application topically as needed for irritation. To buttocks after every incontinent episode and as needed for redness. May keep at bedside.        Allergies:  Allergies  Allergen Reactions   Carbamazepine Hives and Other (See Comments)    Turned purple from neck down and drowsy  Penicillins Hives   Pregabalin Other (See Comments)    Drowsy    Past Medical History, Surgical history, Social history, and Family History were reviewed and updated.  Review of Systems: All other 10 point review of systems is negative.   Physical Exam:  height is 5\' 6"  (1.676 m) and weight is 130 lb (59 kg). Her oral temperature is 97.3 F (36.3 C) (abnormal). Her blood pressure is 86/40 (abnormal) and her pulse is 106 (abnormal). Her respiration is 19 and oxygen saturation is 97%.   Wt Readings from Last 3 Encounters:  11/16/21 130 lb (59 kg)  11/09/21 131 lb 12.8 oz (59.8 kg)  09/15/21 128 lb 12.8 oz (58.4 kg)    Ocular: Sclerae unicteric, pupils equal, round and reactive to light Ear-nose-throat: Oropharynx clear, dentition  fair Lymphatic: No cervical or supraclavicular adenopathy Lungs no rales or rhonchi, good excursion bilaterally Heart regular rate and rhythm, no murmur appreciated Abd soft, nontender, positive bowel sounds MSK no focal spinal tenderness, no joint edema Neuro: non-focal, well-oriented, appropriate affect Breasts: Deferred   Lab Results  Component Value Date   WBC 12.2 (H) 11/16/2021   HGB 9.6 (L) 11/16/2021   HCT 29.9 (L) 11/16/2021   MCV 107.2 (H) 11/16/2021   PLT 153 11/16/2021   Lab Results  Component Value Date   FERRITIN 84 10/16/2021   IRON 73 09/18/2021   TIBC 230 (L) 09/18/2021   UIBC 157 09/18/2021   IRONPCTSAT 32 09/18/2021   Lab Results  Component Value Date   RETICCTPCT 1.7 11/16/2021   RBC 2.81 (L) 11/16/2021   Lab Results  Component Value Date   KPAFRELGTCHN 21.1 (H) 10/16/2021   LAMBDASER 20.6 10/16/2021   KAPLAMBRATIO 1.02 10/16/2021   Lab Results  Component Value Date   IGGSERUM 168 (L) 08/21/2021   IGA 221 08/21/2021   IGMSERUM 1,672 (H) 08/21/2021   No results found for: Odetta Pink, SPEI   Chemistry      Component Value Date/Time   NA 138 11/16/2021 1020   NA 137 11/10/2020 0000   NA 138 08/16/2017 1256   K 4.1 11/16/2021 1020   K 4.3 08/16/2017 1256   CL 106 11/16/2021 1020   CO2 24 11/16/2021 1020   CO2 25 08/16/2017 1256   BUN 40 (H) 11/16/2021 1020   BUN 16 11/10/2020 0000   BUN 19.6 08/16/2017 1256   CREATININE 1.78 (H) 11/16/2021 1020   CREATININE 0.7 08/16/2017 1256   GLU 78 11/10/2020 0000      Component Value Date/Time   CALCIUM 8.5 (L) 11/16/2021 1020   CALCIUM 9.2 08/16/2017 1256   ALKPHOS 45 11/16/2021 1020   ALKPHOS 42 08/16/2017 1256   AST 17 11/16/2021 1020   AST 18 08/16/2017 1256   ALT 8 11/16/2021 1020   ALT 11 08/16/2017 1256   BILITOT 0.3 11/16/2021 1020   BILITOT 0.87 08/16/2017 1256       Impression and Plan: Joy Lynch is a very pleasant 88 yo  caucasian female with relapsed marginal zone lymphoma and multifactorial anemia.   Again, her white cell count is coming down nicely.  I am very happy about this.  Her quality of life is doing about as well as 1 could expect given that she is 88 years old.  She will go ahead with the Retacrit today.  We will plan to get her back to see [place] in another 3-4 weeks.   Volanda Napoleon, MD 2/16/202311:21  AM

## 2021-11-16 NOTE — Telephone Encounter (Signed)
Per 11/16/21 los - gave upcoming appointments - confirmed °

## 2021-11-16 NOTE — Patient Instructions (Signed)
Epoetin Alfa injection °What is this medication? °EPOETIN ALFA (e POE e tin AL fa) helps your body make more red blood cells. This medicine is used to treat anemia caused by chronic kidney disease, cancer chemotherapy, or HIV-therapy. It may also be used before surgery if you have anemia. °This medicine may be used for other purposes; ask your health care provider or pharmacist if you have questions. °COMMON BRAND NAME(S): Epogen, Procrit, Retacrit °What should I tell my care team before I take this medication? °They need to know if you have any of these conditions: °cancer °heart disease °high blood pressure °history of blood clots °history of stroke °low levels of folate, iron, or vitamin B12 in the blood °seizures °an unusual or allergic reaction to erythropoietin, albumin, benzyl alcohol, hamster proteins, other medicines, foods, dyes, or preservatives °pregnant or trying to get pregnant °breast-feeding °How should I use this medication? °This medicine is for injection into a vein or under the skin. It is usually given by a health care professional in a hospital or clinic setting. °If you get this medicine at home, you will be taught how to prepare and give this medicine. Use exactly as directed. Take your medicine at regular intervals. Do not take your medicine more often than directed. °It is important that you put your used needles and syringes in a special sharps container. Do not put them in a trash can. If you do not have a sharps container, call your pharmacist or healthcare provider to get one. °A special MedGuide will be given to you by the pharmacist with each prescription and refill. Be sure to read this information carefully each time. °Talk to your pediatrician regarding the use of this medicine in children. While this drug may be prescribed for selected conditions, precautions do apply. °Overdosage: If you think you have taken too much of this medicine contact a poison control center or emergency  room at once. °NOTE: This medicine is only for you. Do not share this medicine with others. °What if I miss a dose? °If you miss a dose, take it as soon as you can. If it is almost time for your next dose, take only that dose. Do not take double or extra doses. °What may interact with this medication? °Interactions have not been studied. °This list may not describe all possible interactions. Give your health care provider a list of all the medicines, herbs, non-prescription drugs, or dietary supplements you use. Also tell them if you smoke, drink alcohol, or use illegal drugs. Some items may interact with your medicine. °What should I watch for while using this medication? °Your condition will be monitored carefully while you are receiving this medicine. °You may need blood work done while you are taking this medicine. °This medicine may cause a decrease in vitamin B6. You should make sure that you get enough vitamin B6 while you are taking this medicine. Discuss the foods you eat and the vitamins you take with your health care professional. °What side effects may I notice from receiving this medication? °Side effects that you should report to your doctor or health care professional as soon as possible: °allergic reactions like skin rash, itching or hives, swelling of the face, lips, or tongue °seizures °signs and symptoms of a blood clot such as breathing problems; changes in vision; chest pain; severe, sudden headache; pain, swelling, warmth in the leg; trouble speaking; sudden numbness or weakness of the face, arm or leg °signs and symptoms of a stroke like   changes in vision; confusion; trouble speaking or understanding; severe headaches; sudden numbness or weakness of the face, arm or leg; trouble walking; dizziness; loss of balance or coordination °Side effects that usually do not require medical attention (report to your doctor or health care professional if they continue or are  bothersome): °chills °cough °dizziness °fever °headaches °joint pain °muscle cramps °muscle pain °nausea, vomiting °pain, redness, or irritation at site where injected °This list may not describe all possible side effects. Call your doctor for medical advice about side effects. You may report side effects to FDA at 1-800-FDA-1088. °Where should I keep my medication? °Keep out of the reach of children. °Store in a refrigerator between 2 and 8 degrees C (36 and 46 degrees F). Do not freeze or shake. Throw away any unused portion if using a single-dose vial. Multi-dose vials can be kept in the refrigerator for up to 21 days after the initial dose. Throw away unused medicine. °NOTE: This sheet is a summary. It may not cover all possible information. If you have questions about this medicine, talk to your doctor, pharmacist, or health care provider. °© 2022 Elsevier/Gold Standard (2017-05-21 00:00:00) ° °

## 2021-11-17 LAB — IRON AND IRON BINDING CAPACITY (CC-WL,HP ONLY)
Iron: 78 ug/dL (ref 28–170)
Iron: 78 ug/dL (ref 28–170)
Saturation Ratios: 32 % — ABNORMAL HIGH (ref 10.4–31.8)
Saturation Ratios: 32 % — ABNORMAL HIGH (ref 10.4–31.8)
TIBC: 246 ug/dL — ABNORMAL LOW (ref 250–450)
TIBC: 248 ug/dL — ABNORMAL LOW (ref 250–450)
UIBC: 168 ug/dL (ref 148–442)
UIBC: 170 ug/dL (ref 148–442)

## 2021-11-17 LAB — KAPPA/LAMBDA LIGHT CHAINS
Kappa free light chain: 20.9 mg/L — ABNORMAL HIGH (ref 3.3–19.4)
Kappa, lambda light chain ratio: 1.1 (ref 0.26–1.65)
Lambda free light chains: 19 mg/L (ref 5.7–26.3)

## 2021-11-17 LAB — FERRITIN: Ferritin: 66 ng/mL (ref 11–307)

## 2021-11-17 LAB — BETA 2 MICROGLOBULIN, SERUM: Beta-2 Microglobulin: 6 mg/L — ABNORMAL HIGH (ref 0.6–2.4)

## 2021-12-18 ENCOUNTER — Encounter: Payer: Self-pay | Admitting: Orthopedic Surgery

## 2021-12-18 ENCOUNTER — Non-Acute Institutional Stay: Payer: Medicare Other | Admitting: Orthopedic Surgery

## 2021-12-18 DIAGNOSIS — C8588 Other specified types of non-Hodgkin lymphoma, lymph nodes of multiple sites: Secondary | ICD-10-CM

## 2021-12-18 DIAGNOSIS — R142 Eructation: Secondary | ICD-10-CM | POA: Diagnosis not present

## 2021-12-18 DIAGNOSIS — N184 Chronic kidney disease, stage 4 (severe): Secondary | ICD-10-CM

## 2021-12-18 DIAGNOSIS — G500 Trigeminal neuralgia: Secondary | ICD-10-CM

## 2021-12-18 DIAGNOSIS — R600 Localized edema: Secondary | ICD-10-CM

## 2021-12-18 DIAGNOSIS — D631 Anemia in chronic kidney disease: Secondary | ICD-10-CM

## 2021-12-18 DIAGNOSIS — K219 Gastro-esophageal reflux disease without esophagitis: Secondary | ICD-10-CM | POA: Diagnosis not present

## 2021-12-18 DIAGNOSIS — E039 Hypothyroidism, unspecified: Secondary | ICD-10-CM | POA: Diagnosis not present

## 2021-12-18 DIAGNOSIS — M810 Age-related osteoporosis without current pathological fracture: Secondary | ICD-10-CM

## 2021-12-18 NOTE — Progress Notes (Signed)
?Location:  [facility] Room Number: AL09/A ?Place of Service:  ALF (13) ?Provider: Yvonna Alanis, NP ? ?Patient Care Team: ?Virgie Dad, MD as PCP - General (Internal Medicine) ? ?Extended Emergency Contact Information ?Primary Emergency Contact: Genia Plants ?Address: [address] ?         [place], [place] [address] [place] ?Mobile Phone: [phone] ?Relation: Nephew ?Secondary Emergency Contact: Berneice Heinrich, [name] ?Address: [address] ?         [place], [place] [address] [place] ?Home Phone: [phone] ?Mobile Phone: [phone] ?Relation: Nephew ? ?Code Status:  Full code ?Goals of care: Advanced Directive information ?Advanced Directives 12/18/2021  ?Does Patient Have a Medical Advance Directive? Yes  ?Type of Paramedic of [place];Living will  ?Does patient want to make changes to medical advance directive? No - Patient declined  ?Copy of [place] in Chart? Yes - validated most recent copy scanned in chart (See row information)  ?Would patient like information on creating a medical advance directive? -  ? ? ? ?Chief Complaint  ?Patient presents with  ? Acute Visit  ?  Excessive belching  ? ? ?HPI:  ?Pt is a 88 y.o. female seen today for an acute visit for excessive belching.  ? ?In the past week she has noticed increased belching after lunch. Belching extends into the evening. Denies abdominal pain, indigestion, nausea, vomiting. Noted to have increased indigestion last month with Dr. Lyndel Safe. No changes in diet, admits to drinking a small diet ginger ale every afternoon. LBM this morning, described as normal. She is also on Protonix daily. She has a few boxes of Gas-[employer], but has not tired to use them.  ? ?GERD- hgb 9.6 11/06/2021, started on Protonix last month ?Lymphoma- followed by oncology, marginal zone lymphoma has relapsed, WBC trending down- 12.2 with Calquence, remains on Retacrit SQ ?Hypothyroidism- TSH 1.18  09/21/2021, remains on levothyroxine ?BLE- remains on torsemide prn ?CKD- BUN/creat 40/1.78 11/16/2021 ?Anemia- hgb 9.6 11/16/2021 ?Trigeminal neuralgia- remains on gabapentin ?Osteoporosis- DEXA 2021, not on supplement at this time ? ? ?Past Medical History:  ?Diagnosis Date  ? Cancer [facility])   ? non hodgkin lymphoma  ? Chronic lymphocytic leukemia ([facility])   ? History of B-cell lymphoma 11/02/2015  ? Migraines   ? Osteoporosis   ? Thyroid disease   ? Trigeminal neuralgia of left side of face   ? ?Past Surgical History:  ?Procedure Laterality Date  ? gamma knife    ? for trigeminal neuralgia  ? SHOULDER SURGERY    ? Right shoulder replacement  ? TONSILLECTOMY AND ADENOIDECTOMY    ? WISDOM TOOTH EXTRACTION    ? ? ?Allergies  ?Allergen Reactions  ? Carbamazepine Hives and Other (See Comments)  ?  Turned purple from neck down and drowsy ?  ? Penicillins Hives  ? Pregabalin Other (See Comments)  ?  Drowsy  ? ? ?Outpatient Encounter Medications as of 12/18/2021  ?Medication Sig  ? Acalabrutinib Maleate (CALQUENCE) 100 MG TABS Take 100 mg by mouth daily.  ? acetaminophen (TYLENOL) 500 MG tablet Take 500 mg by mouth every 6 (six) hours as needed.  ? acetaminophen (TYLENOL) 500 MG tablet Take 500 mg by mouth 3 (three) times daily.  ? Amino Acids-Protein Hydrolys (PRO-STAT) LIQD Take 30 mLs by mouth daily.  ? aspirin 81 MG chewable tablet Chew 81 mg by mouth daily.  ? famciclovir (FAMVIR) 125 MG tablet Take 250 mg by  mouth daily. Two tablets  ? gabapentin (NEURONTIN) 600 MG tablet Take 600 mg by mouth 3 (three) times daily.  ? lactose free nutrition (BOOST) LIQD Take 237 mLs by mouth daily.  ? levothyroxine (SYNTHROID) 100 MCG tablet Take 112 mcg by mouth daily before breakfast.  ? methocarbamol (ROBAXIN) 500 MG tablet Take 250 mg by mouth daily.  ? Multiple Vitamin (MULTI-VITAMINS) TABS Take 1 tablet by mouth daily.  ? pantoprazole (PROTONIX) 40 MG tablet Take 40 mg by mouth daily.  ? polyvinyl alcohol (LIQUIFILM TEARS) 1.4 %  ophthalmic solution Place 1 drop into both eyes 4 (four) times daily.  ? torsemide (DEMADEX) 10 MG tablet 10 mg daily as needed.  ? vitamin B-12 (CYANOCOBALAMIN) 1000 MCG tablet Take 1 tablet (1,000 mcg total) by mouth daily.  ? white petrolatum ointment Apply topically 2 (two) times daily.  ? zinc oxide 20 % ointment Apply 1 application topically as needed for irritation. To buttocks after every incontinent episode and as needed for redness. May keep at bedside.  ? ?No facility-administered encounter medications on file as of 12/18/2021.  ? ? ?Review of Systems  ?Constitutional:  Negative for activity change, appetite change, chills, fatigue and fever.  ?HENT:  Negative for congestion, sore throat and trouble swallowing.   ?Eyes:  Negative for visual disturbance.  ?Respiratory:  Negative for cough, shortness of breath and wheezing.   ?Cardiovascular:  Positive for leg swelling. Negative for chest pain.  ?Gastrointestinal:  Negative for abdominal distention, abdominal pain, constipation, diarrhea, nausea and vomiting.  ?     Belching  ?Genitourinary:  Negative for dysuria, frequency and hematuria.  ?Musculoskeletal:  Positive for gait problem.  ?Skin:  Negative for wound.  ?Neurological:  Positive for weakness. Negative for dizziness and headaches.  ?Psychiatric/Behavioral:  Negative for confusion, dysphoric mood and sleep disturbance. The patient is not nervous/anxious.   ? ?Immunization History  ?Administered Date(s) Administered  ? Fluad Quad(high Dose 65+) 05/28/2019  ? Influenza Split 07/12/2016, 08/13/2017, 06/02/2019, 09/09/2020  ? Influenza, High Dose Seasonal PF 06/11/2017, 06/28/2018  ? Influenza-Unspecified 06/21/2020, 07/25/2021  ? Moderna SARS-COV2 Booster Vaccination 03/08/2021  ? Moderna Sars-Covid-2 Vaccination 10/05/2019, 11/02/2019, 08/15/2020  ? PFIZER(Purple Top)SARS-COV-2 Vaccination 06/21/2021  ? Pneumococcal Conjugate-13 11/22/2014  ? Pneumococcal Polysaccharide-23 04/29/2009, 08/13/2017  ? Td  01/24/2013  ? Tdap 12/28/2017, 07/01/2018, 12/13/2020  ? Zoster Recombinat (Shingrix) 07/01/2018  ? Zoster, Live 11/12/2009, 07/01/2018  ? ?Pertinent  Health Maintenance Due  ?Topic Date Due  ? INFLUENZA VACCINE  Completed  ? DEXA SCAN  Completed  ? ?Fall Risk 07/21/2021 08/21/2021 09/18/2021 10/16/2021 11/16/2021  ?Falls in the past year? - - - - -  ?Was there an injury with Fall? - - - - -  ?Fall Risk Category Calculator - - - - -  ?Fall Risk Category - - - - -  ?Patient Fall Risk Level High fall risk High fall risk High fall risk High fall risk High fall risk  ?Patient at Risk for Falls Due to - - - - -  ?Fall risk Follow up - - - - -  ? ?Functional Status Survey: ?  ? ?Vitals:  ? 12/18/21 1130  ?BP: (!) 147/77  ?Pulse: 95  ?Resp: 18  ?Temp: (!) 97.3 ?F (36.3 ?C)  ?SpO2: 96%  ?Weight: 132 lb 6.4 oz (60.1 kg)  ?Height: '5\' 6"'$  (1.676 m)  ? ?Body mass index is 21.37 kg/m?Marland Kitchen ?Physical Exam ?Vitals reviewed.  ?Constitutional:   ?   General: She is not in  acute distress. ?   Comments: Pale complexion  ?HENT:  ?   Head: Normocephalic.  ?Eyes:  ?   General:     ?   Right eye: No discharge.     ?   Left eye: No discharge.  ?Neck:  ?   Vascular: No carotid bruit.  ?Cardiovascular:  ?   Rate and Rhythm: Normal rate and regular rhythm.  ?   Pulses: Normal pulses.  ?   Heart sounds: Normal heart sounds.  ?Pulmonary:  ?   Effort: Pulmonary effort is normal. No respiratory distress.  ?   Breath sounds: Normal breath sounds. No wheezing.  ?Abdominal:  ?   General: Bowel sounds are normal. There is no distension.  ?   Palpations: Abdomen is soft.  ?   Tenderness: There is no abdominal tenderness.  ?Musculoskeletal:  ?   Cervical back: Neck supple.  ?   Right lower leg: Edema present.  ?   Left lower leg: Edema present.  ?   Comments: Non-pitting  ?Lymphadenopathy:  ?   Cervical: No cervical adenopathy.  ?Skin: ?   General: Skin is warm and dry.  ?   Capillary Refill: Capillary refill takes less than 2 seconds.  ?Neurological:  ?    General: No focal deficit present.  ?   Mental Status: She is alert and oriented to person, place, and time.  ?   Motor: Weakness present.  ?   Gait: Gait abnormal.  ?   Comments: walker  ?Psychiatric:

## 2021-12-19 ENCOUNTER — Other Ambulatory Visit: Payer: Self-pay

## 2021-12-19 ENCOUNTER — Inpatient Hospital Stay (HOSPITAL_BASED_OUTPATIENT_CLINIC_OR_DEPARTMENT_OTHER): Payer: Medicare Other | Admitting: Family

## 2021-12-19 ENCOUNTER — Encounter: Payer: Self-pay | Admitting: Family

## 2021-12-19 ENCOUNTER — Inpatient Hospital Stay: Payer: Medicare Other | Attending: Hematology & Oncology

## 2021-12-19 ENCOUNTER — Inpatient Hospital Stay: Payer: Medicare Other

## 2021-12-19 VITALS — BP 102/47 | HR 107 | Temp 97.6°F | Resp 16

## 2021-12-19 DIAGNOSIS — D509 Iron deficiency anemia, unspecified: Secondary | ICD-10-CM

## 2021-12-19 DIAGNOSIS — C8588 Other specified types of non-Hodgkin lymphoma, lymph nodes of multiple sites: Secondary | ICD-10-CM

## 2021-12-19 DIAGNOSIS — D630 Anemia in neoplastic disease: Secondary | ICD-10-CM

## 2021-12-19 DIAGNOSIS — N183 Chronic kidney disease, stage 3 unspecified: Secondary | ICD-10-CM | POA: Diagnosis present

## 2021-12-19 DIAGNOSIS — N1832 Chronic kidney disease, stage 3b: Secondary | ICD-10-CM | POA: Diagnosis not present

## 2021-12-19 DIAGNOSIS — D631 Anemia in chronic kidney disease: Secondary | ICD-10-CM | POA: Diagnosis present

## 2021-12-19 DIAGNOSIS — E8809 Other disorders of plasma-protein metabolism, not elsewhere classified: Secondary | ICD-10-CM

## 2021-12-19 DIAGNOSIS — Z8572 Personal history of non-Hodgkin lymphomas: Secondary | ICD-10-CM

## 2021-12-19 LAB — CBC WITH DIFFERENTIAL (CANCER CENTER ONLY)
Abs Immature Granulocytes: 0.47 10*3/uL — ABNORMAL HIGH (ref 0.00–0.07)
Basophils Absolute: 0.1 10*3/uL (ref 0.0–0.1)
Basophils Relative: 1 %
Eosinophils Absolute: 0.1 10*3/uL (ref 0.0–0.5)
Eosinophils Relative: 1 %
HCT: 29.8 % — ABNORMAL LOW (ref 36.0–46.0)
Hemoglobin: 9.7 g/dL — ABNORMAL LOW (ref 12.0–15.0)
Immature Granulocytes: 4 %
Lymphocytes Relative: 53 %
Lymphs Abs: 5.8 10*3/uL — ABNORMAL HIGH (ref 0.7–4.0)
MCH: 33.9 pg (ref 26.0–34.0)
MCHC: 32.6 g/dL (ref 30.0–36.0)
MCV: 104.2 fL — ABNORMAL HIGH (ref 80.0–100.0)
Monocytes Absolute: 0.5 10*3/uL (ref 0.1–1.0)
Monocytes Relative: 5 %
Neutro Abs: 3.9 10*3/uL (ref 1.7–7.7)
Neutrophils Relative %: 36 %
Platelet Count: 154 10*3/uL (ref 150–400)
RBC: 2.86 MIL/uL — ABNORMAL LOW (ref 3.87–5.11)
RDW: 13.9 % (ref 11.5–15.5)
Smear Review: NORMAL
WBC Count: 10.8 10*3/uL — ABNORMAL HIGH (ref 4.0–10.5)
nRBC: 0 % (ref 0.0–0.2)

## 2021-12-19 LAB — CMP (CANCER CENTER ONLY)
ALT: 9 U/L (ref 0–44)
AST: 19 U/L (ref 15–41)
Albumin: 2.8 g/dL — ABNORMAL LOW (ref 3.5–5.0)
Alkaline Phosphatase: 48 U/L (ref 38–126)
Anion gap: 9 (ref 5–15)
BUN: 44 mg/dL — ABNORMAL HIGH (ref 8–23)
CO2: 25 mmol/L (ref 22–32)
Calcium: 8.8 mg/dL — ABNORMAL LOW (ref 8.9–10.3)
Chloride: 107 mmol/L (ref 98–111)
Creatinine: 1.87 mg/dL — ABNORMAL HIGH (ref 0.44–1.00)
GFR, Estimated: 25 mL/min — ABNORMAL LOW (ref >60)
Glucose, Bld: 105 mg/dL — ABNORMAL HIGH (ref 70–99)
Potassium: 4.5 mmol/L (ref 3.5–5.1)
Sodium: 141 mmol/L (ref 135–145)
Total Bilirubin: 0.3 mg/dL (ref 0.3–1.2)
Total Protein: 5.8 g/dL — ABNORMAL LOW (ref 6.5–8.1)

## 2021-12-19 LAB — RETICULOCYTES
Immature Retic Fract: 15.4 % (ref 2.3–15.9)
RBC.: 2.84 MIL/uL — ABNORMAL LOW (ref 3.87–5.11)
Retic Count, Absolute: 45.2 10*3/uL (ref 19.0–186.0)
Retic Ct Pct: 1.6 % (ref 0.4–3.1)

## 2021-12-19 LAB — LACTATE DEHYDROGENASE: LDH: 271 U/L — ABNORMAL HIGH (ref 98–192)

## 2021-12-19 MED ORDER — EPOETIN ALFA-EPBX 40000 UNIT/ML IJ SOLN
40000.0000 [IU] | Freq: Once | INTRAMUSCULAR | Status: AC
  Administered 2021-12-19: 40000 [IU] via SUBCUTANEOUS
  Filled 2021-12-19: qty 1

## 2021-12-19 NOTE — Patient Instructions (Signed)
Epoetin Alfa injection °What is this medication? °EPOETIN ALFA (e POE e tin AL fa) helps your body make more red blood cells. This medicine is used to treat anemia caused by chronic kidney disease, cancer chemotherapy, or HIV-therapy. It may also be used before surgery if you have anemia. °This medicine may be used for other purposes; ask your health care provider or pharmacist if you have questions. °COMMON BRAND NAME(S): Epogen, Procrit, Retacrit °What should I tell my care team before I take this medication? °They need to know if you have any of these conditions: °cancer °heart disease °high blood pressure °history of blood clots °history of stroke °low levels of folate, iron, or vitamin B12 in the blood °seizures °an unusual or allergic reaction to erythropoietin, albumin, benzyl alcohol, hamster proteins, other medicines, foods, dyes, or preservatives °pregnant or trying to get pregnant °breast-feeding °How should I use this medication? °This medicine is for injection into a vein or under the skin. It is usually given by a health care professional in a hospital or clinic setting. °If you get this medicine at home, you will be taught how to prepare and give this medicine. Use exactly as directed. Take your medicine at regular intervals. Do not take your medicine more often than directed. °It is important that you put your used needles and syringes in a special sharps container. Do not put them in a trash can. If you do not have a sharps container, call your pharmacist or healthcare provider to get one. °A special MedGuide will be given to you by the pharmacist with each prescription and refill. Be sure to read this information carefully each time. °Talk to your pediatrician regarding the use of this medicine in children. While this drug may be prescribed for selected conditions, precautions do apply. °Overdosage: If you think you have taken too much of this medicine contact a poison control center or emergency  room at once. °NOTE: This medicine is only for you. Do not share this medicine with others. °What if I miss a dose? °If you miss a dose, take it as soon as you can. If it is almost time for your next dose, take only that dose. Do not take double or extra doses. °What may interact with this medication? °Interactions have not been studied. °This list may not describe all possible interactions. Give your health care provider a list of all the medicines, herbs, non-prescription drugs, or dietary supplements you use. Also tell them if you smoke, drink alcohol, or use illegal drugs. Some items may interact with your medicine. °What should I watch for while using this medication? °Your condition will be monitored carefully while you are receiving this medicine. °You may need blood work done while you are taking this medicine. °This medicine may cause a decrease in vitamin B6. You should make sure that you get enough vitamin B6 while you are taking this medicine. Discuss the foods you eat and the vitamins you take with your health care professional. °What side effects may I notice from receiving this medication? °Side effects that you should report to your doctor or health care professional as soon as possible: °allergic reactions like skin rash, itching or hives, swelling of the face, lips, or tongue °seizures °signs and symptoms of a blood clot such as breathing problems; changes in vision; chest pain; severe, sudden headache; pain, swelling, warmth in the leg; trouble speaking; sudden numbness or weakness of the face, arm or leg °signs and symptoms of a stroke like   changes in vision; confusion; trouble speaking or understanding; severe headaches; sudden numbness or weakness of the face, arm or leg; trouble walking; dizziness; loss of balance or coordination °Side effects that usually do not require medical attention (report to your doctor or health care professional if they continue or are  bothersome): °chills °cough °dizziness °fever °headaches °joint pain °muscle cramps °muscle pain °nausea, vomiting °pain, redness, or irritation at site where injected °This list may not describe all possible side effects. Call your doctor for medical advice about side effects. You may report side effects to FDA at 1-800-FDA-1088. °Where should I keep my medication? °Keep out of the reach of children. °Store in a refrigerator between 2 and 8 degrees C (36 and 46 degrees F). Do not freeze or shake. Throw away any unused portion if using a single-dose vial. Multi-dose vials can be kept in the refrigerator for up to 21 days after the initial dose. Throw away unused medicine. °NOTE: This sheet is a summary. It may not cover all possible information. If you have questions about this medicine, talk to your doctor, pharmacist, or health care provider. °© 2022 Elsevier/Gold Standard (2017-05-21 00:00:00) ° °

## 2021-12-19 NOTE — Progress Notes (Signed)
?Hematology and Oncology Follow Up Visit ? ?Joy Lynch ?[id number] ?1925-12-23 88 y.o. ?12/19/2021 ? ? ?Principle Diagnosis:  ?Marginal Zone lymphoma -- relapsed ?Anemia secondary to chronic kidney disease, stage III ?  ?Past Therapy: ?Umbralisib 400 mg po q day, started on 10/22/2020 - discontinued by manufacturer  ?  ?Current Therapy:        ?Calquence 100 mg PO daily, started 05/25/2021 ?Retacrit SQ as indicated for Hgb < 11  ?  ?Interim History:  Joy Lynch is here today with her nephew for follow-up. She is doing well and has no complaints at this time.  ?WBC count is 10.8, Hgb 9.7, platelets 154.  ?No blood loss noted. No petechiae.  ?No fever, chills, n/v, cough, rash, dizziness, SOB, chest pain, palpitations, abdominal pain or changes in bowel or bladder habits.  ?No swelling, tenderness, numbness or tingling in her extremities.  ?No falls or syncope to report. She ambulates with a cane or walker at home for added support.  ?She states that her appetite is good but she needs to better hydrate throughout the day. ? ?ECOG Performance Status: 1 - Symptomatic but completely ambulatory ? ?Medications:  ?Allergies as of 12/19/2021   ? ?   Reactions  ? Carbamazepine Hives, Other (See Comments)  ? Turned purple from neck down and drowsy  ? Penicillins Hives  ? Pregabalin Other (See Comments)  ? Drowsy  ? ?  ? ?  ?Medication List  ?  ? ?  ? Accurate as of December 19, 2021  1:50 PM. If you have any questions, ask your nurse or doctor.  ?  ?  ? ?  ? ?acetaminophen 500 MG tablet ?Commonly known as: TYLENOL ?Take 500 mg by mouth every 6 (six) hours as needed. ?  ?acetaminophen 500 MG tablet ?Commonly known as: TYLENOL ?Take 500 mg by mouth 3 (three) times daily. ?  ?aspirin 81 MG chewable tablet ?Chew 81 mg by mouth daily. ?  ?Calquence 100 MG Tabs ?Generic drug: Acalabrutinib Maleate ?Take 100 mg by mouth daily. ?  ?famciclovir 125 MG tablet ?Commonly known as: FAMVIR ?Take 250 mg by mouth daily. Two tablets ?  ?gabapentin  600 MG tablet ?Commonly known as: NEURONTIN ?Take 600 mg by mouth 3 (three) times daily. ?  ?lactose free nutrition Liqd ?Take 237 mLs by mouth daily. ?  ?levothyroxine 100 MCG tablet ?Commonly known as: SYNTHROID ?Take 112 mcg by mouth daily before breakfast. ?  ?methocarbamol 500 MG tablet ?Commonly known as: ROBAXIN ?Take 250 mg by mouth daily. ?  ?Multi-Vitamins Tabs ?Take 1 tablet by mouth daily. ?  ?pantoprazole 40 MG tablet ?Commonly known as: PROTONIX ?Take 40 mg by mouth daily. ?  ?polyvinyl alcohol 1.4 % ophthalmic solution ?Commonly known as: LIQUIFILM TEARS ?Place 1 drop into both eyes 4 (four) times daily. ?  ?Pro-Stat Liqd ?Take 30 mLs by mouth daily. ?  ?torsemide 10 MG tablet ?Commonly known as: DEMADEX ?10 mg daily as needed. ?  ?vitamin B-12 1000 MCG tablet ?Commonly known as: CYANOCOBALAMIN ?Take 1 tablet (1,000 mcg total) by mouth daily. ?  ?white petrolatum ointment ?Apply topically 2 (two) times daily. ?  ?zinc oxide 20 % ointment ?Apply 1 application topically as needed for irritation. To buttocks after every incontinent episode and as needed for redness. May keep at bedside. ?  ? ?  ? ? ?Allergies:  ?Allergies  ?Allergen Reactions  ? Carbamazepine Hives and Other (See Comments)  ?  Turned purple from neck down and drowsy ?  ?  Penicillins Hives  ? Pregabalin Other (See Comments)  ?  Drowsy  ? ? ?Past Medical History, Surgical history, Social history, and Family History were reviewed and updated. ? ?Review of Systems: ?All other 10 point review of systems is negative.  ? ?Physical Exam: ? vitals were not taken for this visit.  ? ?Wt Readings from Last 3 Encounters:  ?12/18/21 132 lb 6.4 oz (60.1 kg)  ?11/16/21 130 lb (59 kg)  ?11/09/21 131 lb 12.8 oz (59.8 kg)  ? ? ?Ocular: Sclerae unicteric, pupils equal, round and reactive to light ?Ear-nose-throat: Oropharynx clear, dentition fair ?Lymphatic: No cervical or supraclavicular adenopathy ?Lungs no rales or rhonchi, good excursion  bilaterally ?Heart regular rate and rhythm, no murmur appreciated ?Abd soft, nontender, positive bowel sounds ?MSK no focal spinal tenderness, no joint edema ?Neuro: non-focal, well-oriented, appropriate affect ?Breasts: Deferred  ? ?Lab Results  ?Component Value Date  ? WBC 10.8 (H) 12/19/2021  ? HGB 9.7 (L) 12/19/2021  ? HCT 29.8 (L) 12/19/2021  ? MCV 104.2 (H) 12/19/2021  ? PLT 154 12/19/2021  ? ?Lab Results  ?Component Value Date  ? FERRITIN 66 11/16/2021  ? IRON 78 11/16/2021  ? TIBC 246 (L) 11/16/2021  ? UIBC 168 11/16/2021  ? IRONPCTSAT 32 (H) 11/16/2021  ? ?Lab Results  ?Component Value Date  ? RETICCTPCT 1.6 12/19/2021  ? RBC 2.84 (L) 12/19/2021  ? ?Lab Results  ?Component Value Date  ? KPAFRELGTCHN 20.9 (H) 11/16/2021  ? LAMBDASER 19.0 11/16/2021  ? KAPLAMBRATIO 1.10 11/16/2021  ? ?Lab Results  ?Component Value Date  ? IGGSERUM 168 (L) 08/21/2021  ? IGA 221 08/21/2021  ? IGMSERUM 3,568 (H) 08/21/2021  ? ?No results found for: TOTALPROTELP, ALBUMINELP, A1GS, A2GS, BETS, BETA2SER, GAMS, MSPIKE, SPEI ?  Chemistry   ?   ?Component Value Date/Time  ? NA 138 11/16/2021 1020  ? NA 137 11/10/2020 0000  ? NA 138 08/16/2017 1256  ? K 4.1 11/16/2021 1020  ? K 4.3 08/16/2017 1256  ? CL 106 11/16/2021 1020  ? CO2 24 11/16/2021 1020  ? CO2 25 08/16/2017 1256  ? BUN 40 (H) 11/16/2021 1020  ? BUN 16 11/10/2020 0000  ? BUN 19.6 08/16/2017 1256  ? CREATININE 1.78 (H) 11/16/2021 1020  ? CREATININE 0.7 08/16/2017 1256  ? GLU 78 11/10/2020 0000  ?    ?Component Value Date/Time  ? CALCIUM 8.5 (L) 11/16/2021 1020  ? CALCIUM 9.2 08/16/2017 1256  ? ALKPHOS 45 11/16/2021 1020  ? ALKPHOS 42 08/16/2017 1256  ? AST 17 11/16/2021 1020  ? AST 18 08/16/2017 1256  ? ALT 8 11/16/2021 1020  ? ALT 11 08/16/2017 1256  ? BILITOT 0.3 11/16/2021 1020  ? BILITOT 0.87 08/16/2017 1256  ?  ? ? ? ?Impression and Plan: Joy Lynch is a very pleasant 88 yo caucasian female with relapsed marginal zone lymphoma and multifactorial anemia.  ?ESA given today,  Hgb 9.7.  ?She will continue her same regimen with Calquence.  ?Follow-up in 1 month.  ? ?Lottie Dawson, NP ?3/21/20231:50 PM ? ?

## 2021-12-20 LAB — IRON AND IRON BINDING CAPACITY (CC-WL,HP ONLY)
Iron: 54 ug/dL (ref 28–170)
Saturation Ratios: 21 % (ref 10.4–31.8)
TIBC: 256 ug/dL (ref 250–450)
UIBC: 202 ug/dL (ref 148–442)

## 2021-12-20 LAB — FERRITIN: Ferritin: 57 ng/mL (ref 11–307)

## 2022-01-05 LAB — TSH: TSH: 10.52 — AB (ref 0.41–5.90)

## 2022-01-24 ENCOUNTER — Inpatient Hospital Stay: Payer: Medicare Other

## 2022-01-24 ENCOUNTER — Encounter: Payer: Self-pay | Admitting: Family

## 2022-01-24 ENCOUNTER — Inpatient Hospital Stay (HOSPITAL_BASED_OUTPATIENT_CLINIC_OR_DEPARTMENT_OTHER): Payer: Medicare Other | Admitting: Family

## 2022-01-24 ENCOUNTER — Inpatient Hospital Stay: Payer: Medicare Other | Attending: Hematology & Oncology

## 2022-01-24 VITALS — BP 84/68 | HR 102 | Temp 97.8°F | Resp 18 | Wt 135.8 lb

## 2022-01-24 DIAGNOSIS — N1831 Chronic kidney disease, stage 3a: Secondary | ICD-10-CM | POA: Diagnosis not present

## 2022-01-24 DIAGNOSIS — D509 Iron deficiency anemia, unspecified: Secondary | ICD-10-CM

## 2022-01-24 DIAGNOSIS — D631 Anemia in chronic kidney disease: Secondary | ICD-10-CM

## 2022-01-24 DIAGNOSIS — N183 Chronic kidney disease, stage 3 unspecified: Secondary | ICD-10-CM

## 2022-01-24 DIAGNOSIS — E8809 Other disorders of plasma-protein metabolism, not elsewhere classified: Secondary | ICD-10-CM

## 2022-01-24 DIAGNOSIS — N1832 Chronic kidney disease, stage 3b: Secondary | ICD-10-CM

## 2022-01-24 DIAGNOSIS — C8588 Other specified types of non-Hodgkin lymphoma, lymph nodes of multiple sites: Secondary | ICD-10-CM

## 2022-01-24 DIAGNOSIS — Z8572 Personal history of non-Hodgkin lymphomas: Secondary | ICD-10-CM

## 2022-01-24 LAB — CBC WITH DIFFERENTIAL (CANCER CENTER ONLY)
Abs Immature Granulocytes: 0.09 10*3/uL — ABNORMAL HIGH (ref 0.00–0.07)
Basophils Absolute: 0 10*3/uL (ref 0.0–0.1)
Basophils Relative: 0 %
Eosinophils Absolute: 0.2 10*3/uL (ref 0.0–0.5)
Eosinophils Relative: 1 %
HCT: 31 % — ABNORMAL LOW (ref 36.0–46.0)
Hemoglobin: 10 g/dL — ABNORMAL LOW (ref 12.0–15.0)
Immature Granulocytes: 1 %
Lymphocytes Relative: 45 %
Lymphs Abs: 6.4 10*3/uL — ABNORMAL HIGH (ref 0.7–4.0)
MCH: 33.3 pg (ref 26.0–34.0)
MCHC: 32.3 g/dL (ref 30.0–36.0)
MCV: 103.3 fL — ABNORMAL HIGH (ref 80.0–100.0)
Monocytes Absolute: 0.7 10*3/uL (ref 0.1–1.0)
Monocytes Relative: 5 %
Neutro Abs: 6.8 10*3/uL (ref 1.7–7.7)
Neutrophils Relative %: 48 %
Platelet Count: 157 10*3/uL (ref 150–400)
RBC: 3 MIL/uL — ABNORMAL LOW (ref 3.87–5.11)
RDW: 14.6 % (ref 11.5–15.5)
Smear Review: NORMAL
WBC Count: 14.3 10*3/uL — ABNORMAL HIGH (ref 4.0–10.5)
nRBC: 0 % (ref 0.0–0.2)

## 2022-01-24 LAB — CMP (CANCER CENTER ONLY)
ALT: 8 U/L (ref 0–44)
AST: 15 U/L (ref 15–41)
Albumin: 2.9 g/dL — ABNORMAL LOW (ref 3.5–5.0)
Alkaline Phosphatase: 46 U/L (ref 38–126)
Anion gap: 8 (ref 5–15)
BUN: 45 mg/dL — ABNORMAL HIGH (ref 8–23)
CO2: 25 mmol/L (ref 22–32)
Calcium: 8.9 mg/dL (ref 8.9–10.3)
Chloride: 110 mmol/L (ref 98–111)
Creatinine: 2.14 mg/dL — ABNORMAL HIGH (ref 0.44–1.00)
GFR, Estimated: 21 mL/min — ABNORMAL LOW (ref >60)
Glucose, Bld: 95 mg/dL (ref 70–99)
Potassium: 4.9 mmol/L (ref 3.5–5.1)
Sodium: 143 mmol/L (ref 135–145)
Total Bilirubin: 0.3 mg/dL (ref 0.3–1.2)
Total Protein: 6 g/dL — ABNORMAL LOW (ref 6.5–8.1)

## 2022-01-24 LAB — RETICULOCYTES
Immature Retic Fract: 16.5 % — ABNORMAL HIGH (ref 2.3–15.9)
RBC.: 2.97 MIL/uL — ABNORMAL LOW (ref 3.87–5.11)
Retic Count, Absolute: 69.2 10*3/uL (ref 19.0–186.0)
Retic Ct Pct: 2.3 % (ref 0.4–3.1)

## 2022-01-24 LAB — LACTATE DEHYDROGENASE: LDH: 192 U/L (ref 98–192)

## 2022-01-24 LAB — FERRITIN: Ferritin: 40 ng/mL (ref 11–307)

## 2022-01-24 MED ORDER — EPOETIN ALFA-EPBX 40000 UNIT/ML IJ SOLN
40000.0000 [IU] | Freq: Once | INTRAMUSCULAR | Status: AC
  Administered 2022-01-24: 40000 [IU] via SUBCUTANEOUS
  Filled 2022-01-24: qty 1

## 2022-01-24 NOTE — Progress Notes (Signed)
?Hematology and Oncology Follow Up Visit ? ?Joy Lynch ?[id number] ?Nov 12, 1925 88 y.o. ?01/24/2022 ? ? ?Principle Diagnosis:  ?Marginal Zone lymphoma -- relapsed ?Anemia secondary to chronic kidney disease, stage III ?  ?Past Therapy: ?Umbralisib 400 mg po q day, started on 10/22/2020 - discontinued by manufacturer  ?  ?Current Therapy:        ?Calquence 100 mg PO daily, started 05/25/2021 ?Retacrit SQ as indicated for Hgb < 11  ?  ?Interim History:  [name] is here today with her nephew for follow-up and injection. She is doing fairly well but does have fatigue at times as well as mild SOB with exertion.  ?No blood loss noted. No petechiae.  ?No fever, chills, n/v, cough, rash, dizziness, chest pain, palpitations, abdominal pain or changes in bowel or bladder habits.  ?No swelling, tenderness, numbness or tingling in her extremities at this time.  ?No falls or syncope reported.  ?She states that her appetite is quite good and she is staying well hydrated. Her weight is 133 lbs.  ? ?ECOG Performance Status: 1 - Symptomatic but completely ambulatory ? ?Medications:  ?Allergies as of 01/24/2022   ? ?   Reactions  ? Carbamazepine Hives, Other (See Comments)  ? Turned purple from neck down and drowsy  ? Penicillins Hives  ? Pregabalin Other (See Comments)  ? Drowsy  ? ?  ? ?  ?Medication List  ?  ? ?  ? Accurate as of January 24, 2022  2:19 PM. If you have any questions, ask your nurse or doctor.  ?  ?  ? ?  ? ?acetaminophen 500 MG tablet ?Commonly known as: TYLENOL ?Take 500 mg by mouth every 6 (six) hours as needed. ?  ?acetaminophen 500 MG tablet ?Commonly known as: TYLENOL ?Take 500 mg by mouth 3 (three) times daily. ?  ?aspirin 81 MG chewable tablet ?Chew 81 mg by mouth daily. ?  ?Calquence 100 MG Tabs ?Generic drug: Acalabrutinib Maleate ?Take 100 mg by mouth daily. ?  ?famciclovir 125 MG tablet ?Commonly known as: FAMVIR ?Take 250 mg by mouth daily. Two tablets ?  ?gabapentin 600 MG tablet ?Commonly known as:  NEURONTIN ?Take 600 mg by mouth 3 (three) times daily. ?  ?lactose free nutrition Liqd ?Take 237 mLs by mouth daily. ?  ?levothyroxine 100 MCG tablet ?Commonly known as: SYNTHROID ?Take 112 mcg by mouth daily before breakfast. ?  ?methocarbamol 500 MG tablet ?Commonly known as: ROBAXIN ?Take 250 mg by mouth daily. ?  ?Multi-Vitamins Tabs ?Take 1 tablet by mouth daily. ?  ?pantoprazole 40 MG tablet ?Commonly known as: PROTONIX ?Take 40 mg by mouth daily. ?  ?polyvinyl alcohol 1.4 % ophthalmic solution ?Commonly known as: LIQUIFILM TEARS ?Place 1 drop into both eyes 4 (four) times daily. ?  ?Pro-Stat Liqd ?Take 30 mLs by mouth daily. ?  ?torsemide 10 MG tablet ?Commonly known as: DEMADEX ?10 mg daily as needed. ?  ?vitamin B-12 1000 MCG tablet ?Commonly known as: CYANOCOBALAMIN ?Take 1 tablet (1,000 mcg total) by mouth daily. ?  ?white petrolatum ointment ?Apply topically 2 (two) times daily. ?  ?zinc oxide 20 % ointment ?Apply 1 application topically as needed for irritation. To buttocks after every incontinent episode and as needed for redness. May keep at bedside. ?  ? ?  ? ? ?Allergies:  ?Allergies  ?Allergen Reactions  ? Carbamazepine Hives and Other (See Comments)  ?  Turned purple from neck down and drowsy ?  ? Penicillins Hives  ?  Pregabalin Other (See Comments)  ?  Drowsy  ? ? ?Past Medical History, Surgical history, Social history, and Family History were reviewed and updated. ? ?Review of Systems: ?All other 10 point review of systems is negative.  ? ?Physical Exam: ? vitals were not taken for this visit.  ? ?Wt Readings from Last 3 Encounters:  ?12/18/21 132 lb 6.4 oz (60.1 kg)  ?11/16/21 130 lb (59 kg)  ?11/09/21 131 lb 12.8 oz (59.8 kg)  ? ? ?Ocular: Sclerae unicteric, pupils equal, round and reactive to light ?Ear-nose-throat: Oropharynx clear, dentition fair ?Lymphatic: No cervical or supraclavicular adenopathy ?Lungs no rales or rhonchi, good excursion bilaterally ?Heart regular rate and rhythm, no  murmur appreciated ?Abd soft, nontender, positive bowel sounds ?MSK no focal spinal tenderness, no joint edema ?Neuro: non-focal, well-oriented, appropriate affect ?Breasts: Deferred  ? ?Lab Results  ?Component Value Date  ? WBC 14.3 (H) 01/24/2022  ? HGB 10.0 (L) 01/24/2022  ? HCT 31.0 (L) 01/24/2022  ? MCV 103.3 (H) 01/24/2022  ? PLT 157 01/24/2022  ? ?Lab Results  ?Component Value Date  ? FERRITIN 57 12/19/2021  ? IRON 54 12/19/2021  ? TIBC 256 12/19/2021  ? UIBC 202 12/19/2021  ? IRONPCTSAT 21 12/19/2021  ? ?Lab Results  ?Component Value Date  ? RETICCTPCT 2.3 01/24/2022  ? RBC 2.97 (L) 01/24/2022  ? ?Lab Results  ?Component Value Date  ? KPAFRELGTCHN 20.9 (H) 11/16/2021  ? LAMBDASER 19.0 11/16/2021  ? KAPLAMBRATIO 1.10 11/16/2021  ? ?Lab Results  ?Component Value Date  ? IGGSERUM 168 (L) 08/21/2021  ? IGA 221 08/21/2021  ? IGMSERUM 2,637 (H) 08/21/2021  ? ?No results found for: TOTALPROTELP, ALBUMINELP, A1GS, A2GS, BETS, BETA2SER, GAMS, MSPIKE, SPEI ?  Chemistry   ?   ?Component Value Date/Time  ? NA 141 12/19/2021 1333  ? NA 137 11/10/2020 0000  ? NA 138 08/16/2017 1256  ? K 4.5 12/19/2021 1333  ? K 4.3 08/16/2017 1256  ? CL 107 12/19/2021 1333  ? CO2 25 12/19/2021 1333  ? CO2 25 08/16/2017 1256  ? BUN 44 (H) 12/19/2021 1333  ? BUN 16 11/10/2020 0000  ? BUN 19.6 08/16/2017 1256  ? CREATININE 1.87 (H) 12/19/2021 1333  ? CREATININE 0.7 08/16/2017 1256  ? GLU 78 11/10/2020 0000  ?    ?Component Value Date/Time  ? CALCIUM 8.8 (L) 12/19/2021 1333  ? CALCIUM 9.2 08/16/2017 1256  ? ALKPHOS 48 12/19/2021 1333  ? ALKPHOS 42 08/16/2017 1256  ? AST 19 12/19/2021 1333  ? AST 18 08/16/2017 1256  ? ALT 9 12/19/2021 1333  ? ALT 11 08/16/2017 1256  ? BILITOT 0.3 12/19/2021 1333  ? BILITOT 0.87 08/16/2017 1256  ?  ? ? ? ?Impression and Plan: [name] is a very pleasant 88 yo caucasian female with relapsed marginal zone lymphoma and multifactorial anemia.  ?ESA given, Hgb 10.0.  ?Iron studies pending.  ?No changes to  Calquence.  ?Follow-up in 1 month.  ? ?Lottie Dawson, NP ?4/26/20232:19 PM ? ?

## 2022-01-24 NOTE — Patient Instructions (Signed)
Epoetin Alfa injection ?What is this medication? ?EPOETIN ALFA (e POE e tin AL fa) helps your body make more red blood cells. This medicine is used to treat anemia caused by chronic kidney disease, cancer chemotherapy, or HIV-therapy. It may also be used before surgery if you have anemia. ?This medicine may be used for other purposes; ask your health care provider or pharmacist if you have questions. ?COMMON BRAND NAME(S): Epogen, Procrit, Retacrit ?What should I tell my care team before I take this medication? ?They need to know if you have any of these conditions: ?cancer ?heart disease ?high blood pressure ?history of blood clots ?history of stroke ?low levels of folate, iron, or vitamin B12 in the blood ?seizures ?an unusual or allergic reaction to erythropoietin, albumin, benzyl alcohol, hamster proteins, other medicines, foods, dyes, or preservatives ?pregnant or trying to get pregnant ?breast-feeding ?How should I use this medication? ?This medicine is for injection into a vein or under the skin. It is usually given by a health care professional in a hospital or clinic setting. ?If you get this medicine at home, you will be taught how to prepare and give this medicine. Use exactly as directed. Take your medicine at regular intervals. Do not take your medicine more often than directed. ?It is important that you put your used needles and syringes in a special sharps container. Do not put them in a trash can. If you do not have a sharps container, call your pharmacist or healthcare provider to get one. ?A special MedGuide will be given to you by the pharmacist with each prescription and refill. Be sure to read this information carefully each time. ?Talk to your pediatrician regarding the use of this medicine in children. While this drug may be prescribed for selected conditions, precautions do apply. ?Overdosage: If you think you have taken too much of this medicine contact a poison control center or emergency  room at once. ?NOTE: This medicine is only for you. Do not share this medicine with others. ?What if I miss a dose? ?If you miss a dose, take it as soon as you can. If it is almost time for your next dose, take only that dose. Do not take double or extra doses. ?What may interact with this medication? ?Interactions have not been studied. ?This list may not describe all possible interactions. Give your health care provider a list of all the medicines, herbs, non-prescription drugs, or dietary supplements you use. Also tell them if you smoke, drink alcohol, or use illegal drugs. Some items may interact with your medicine. ?What should I watch for while using this medication? ?Your condition will be monitored carefully while you are receiving this medicine. ?You may need blood work done while you are taking this medicine. ?This medicine may cause a decrease in vitamin B6. You should make sure that you get enough vitamin B6 while you are taking this medicine. Discuss the foods you eat and the vitamins you take with your health care professional. ?What side effects may I notice from receiving this medication? ?Side effects that you should report to your doctor or health care professional as soon as possible: ?allergic reactions like skin rash, itching or hives, swelling of the face, lips, or tongue ?seizures ?signs and symptoms of a blood clot such as breathing problems; changes in vision; chest pain; severe, sudden headache; pain, swelling, warmth in the leg; trouble speaking; sudden numbness or weakness of the face, arm or leg ?signs and symptoms of a stroke like   changes in vision; confusion; trouble speaking or understanding; severe headaches; sudden numbness or weakness of the face, arm or leg; trouble walking; dizziness; loss of balance or coordination ?Side effects that usually do not require medical attention (report to your doctor or health care professional if they continue or are  bothersome): ?chills ?cough ?dizziness ?fever ?headaches ?joint pain ?muscle cramps ?muscle pain ?nausea, vomiting ?pain, redness, or irritation at site where injected ?This list may not describe all possible side effects. Call your doctor for medical advice about side effects. You may report side effects to FDA at 1-800-FDA-1088. ?Where should I keep my medication? ?Keep out of the reach of children. ?Store in a refrigerator between 2 and 8 degrees C (36 and 46 degrees F). Do not freeze or shake. Throw away any unused portion if using a single-dose vial. Multi-dose vials can be kept in the refrigerator for up to 21 days after the initial dose. Throw away unused medicine. ?NOTE: This sheet is a summary. It may not cover all possible information. If you have questions about this medicine, talk to your doctor, pharmacist, or health care provider. ?? 2023 [employer]/Gold Standard (2017-05-21 00:00:00) ? ?

## 2022-01-25 LAB — IRON AND IRON BINDING CAPACITY (CC-WL,HP ONLY)
Iron: 54 ug/dL (ref 28–170)
Saturation Ratios: 20 % (ref 10.4–31.8)
TIBC: 267 ug/dL (ref 250–450)
UIBC: 213 ug/dL (ref 148–442)

## 2022-02-08 ENCOUNTER — Non-Acute Institutional Stay: Payer: Medicare Other | Admitting: Internal Medicine

## 2022-02-08 ENCOUNTER — Encounter: Payer: Self-pay | Admitting: Internal Medicine

## 2022-02-08 DIAGNOSIS — C8588 Other specified types of non-Hodgkin lymphoma, lymph nodes of multiple sites: Secondary | ICD-10-CM | POA: Diagnosis not present

## 2022-02-08 DIAGNOSIS — R350 Frequency of micturition: Secondary | ICD-10-CM

## 2022-02-08 DIAGNOSIS — D631 Anemia in chronic kidney disease: Secondary | ICD-10-CM

## 2022-02-08 DIAGNOSIS — E039 Hypothyroidism, unspecified: Secondary | ICD-10-CM | POA: Diagnosis not present

## 2022-02-08 DIAGNOSIS — K219 Gastro-esophageal reflux disease without esophagitis: Secondary | ICD-10-CM | POA: Diagnosis not present

## 2022-02-08 DIAGNOSIS — G500 Trigeminal neuralgia: Secondary | ICD-10-CM

## 2022-02-08 DIAGNOSIS — N184 Chronic kidney disease, stage 4 (severe): Secondary | ICD-10-CM

## 2022-02-08 DIAGNOSIS — R600 Localized edema: Secondary | ICD-10-CM

## 2022-02-08 NOTE — Progress Notes (Signed)
?Location:   [facility] Room Number: 9 ?Place of Service:  ALF (13) ?Provider:  Veleta Miners MD ? ?Virgie Dad, MD ? ?Patient Care Team: ?Virgie Dad, MD as PCP - General (Internal Medicine) ? ?Extended Emergency Contact Information ?Primary Emergency Contact: Genia Plants ?Address: [address] ?         [place], [place] [address] [place] ?Mobile Phone: [phone] ?Relation: Nephew ?Secondary Emergency Contact: Berneice Heinrich, [name] ?Address: [address] ?         [place], [place] [address] [place] ?Home Phone: [phone] ?Mobile Phone: [phone] ?Relation: Nephew ? ?Code Status:  Full Code Managed Care ?Goals of care: Advanced Directive information ? ?  02/08/2022  ? 11:54 AM  ?Advanced Directives  ?Does Patient Have a Medical Advance Directive? Yes  ?Type of Paramedic of [place];Living will  ?Does patient want to make changes to medical advance directive? No - Patient declined  ?Copy of [place] in Chart? Yes - validated most recent copy scanned in chart (See row information)  ? ? ? ?Chief Complaint  ?Patient presents with  ? Medical Management of Chronic Issues  ? Quality Metric Gaps  ?  Verified [facility] and [facility] patient is due for shingrix  ? ? ?HPI:  ?Pt is a 88 y.o. female seen today for medical management of chronic diseases.   ? ?Patient has a history of Relapsed Marginal Zone  lymphoma followed by oncology now on Oral therapy  ?osteoporosis,  ? hypothyroidism, ? trigeminal neuralgia and migraines ?  H/o  frontoparietal subarachnoid hemorrhage and Rib fractures ?LE edema mild ? ?Acute issue today ? ?C/o Reflux Symptoms still especially at night ? ?LE edema  ?And increased Urinary frequency at night ?Not in daytime. No Dysuria ?Does drink Coffee and tea with her supper ? ?Other wise stays stable ? Marland Kitchen No new Nursing issues. No Behavior issues ?Her weight is stable ?Walks with her walker ?No Falls ?Wt  Readings from Last 3 Encounters:  ?02/08/22 132 lb 6.4 oz (60.1 kg)  ?01/24/22 135 lb 12.8 oz (61.6 kg)  ?12/18/21 132 lb 6.4 oz (60.1 kg)  ? ? ? ?Past Medical History:  ?Diagnosis Date  ? Cancer [facility])   ? non hodgkin lymphoma  ? Chronic lymphocytic leukemia ([facility])   ? History of B-cell lymphoma 11/02/2015  ? Migraines   ? Osteoporosis   ? Thyroid disease   ? Trigeminal neuralgia of left side of face   ? ?Past Surgical History:  ?Procedure Laterality Date  ? gamma knife    ? for trigeminal neuralgia  ? SHOULDER SURGERY    ? Right shoulder replacement  ? TONSILLECTOMY AND ADENOIDECTOMY    ? WISDOM TOOTH EXTRACTION    ? ? ?Allergies  ?Allergen Reactions  ? Carbamazepine Hives and Other (See Comments)  ?  Turned purple from neck down and drowsy ?  ? Penicillins Hives  ? Pregabalin Other (See Comments)  ?  Drowsy  ? ? ?Allergies as of 02/08/2022   ? ?   Reactions  ? Carbamazepine Hives, Other (See Comments)  ? Turned purple from neck down and drowsy  ? Penicillins Hives  ? Pregabalin Other (See Comments)  ? Drowsy  ? ?  ? ?  ?Medication List  ?  ? ?  ? Accurate as of Feb 08, 2022 11:54 AM. If you have any questions, ask your nurse or doctor.  ?  ?  ? ?  ? ?  acetaminophen 500 MG tablet ?Commonly known as: TYLENOL ?Take 500 mg by mouth every 6 (six) hours as needed. ?  ?acetaminophen 500 MG tablet ?Commonly known as: TYLENOL ?Take 500 mg by mouth 3 (three) times daily. ?  ?aspirin 81 MG chewable tablet ?Chew 81 mg by mouth daily. ?  ?Calquence 100 MG Tabs ?Generic drug: Acalabrutinib Maleate ?Take 100 mg by mouth daily. ?  ?famciclovir 125 MG tablet ?Commonly known as: FAMVIR ?Take 250 mg by mouth daily. Two tablets ?  ?gabapentin 600 MG tablet ?Commonly known as: NEURONTIN ?Take 600 mg by mouth 3 (three) times daily. ?  ?lactose free nutrition Liqd ?Take 237 mLs by mouth daily. ?  ?levothyroxine 100 MCG tablet ?Commonly known as: SYNTHROID ?Take 112 mcg by mouth daily before breakfast. ?  ?methocarbamol 500 MG  tablet ?Commonly known as: ROBAXIN ?Take 250 mg by mouth daily. ?  ?Multi-Vitamins Tabs ?Take 1 tablet by mouth daily. ?  ?pantoprazole 40 MG tablet ?Commonly known as: PROTONIX ?Take 40 mg by mouth daily. ?  ?polyvinyl alcohol 1.4 % ophthalmic solution ?Commonly known as: LIQUIFILM TEARS ?Place 1 drop into both eyes 4 (four) times daily. ?  ?Pro-Stat Liqd ?Take 30 mLs by mouth daily. ?  ?simethicone 125 MG chewable tablet ?Commonly known as: MYLICON ?Chew 125 mg by mouth every 6 (six) hours as needed for flatulence. ?  ?torsemide 10 MG tablet ?Commonly known as: DEMADEX ?10 mg daily as needed. ?  ?vitamin B-12 1000 MCG tablet ?Commonly known as: CYANOCOBALAMIN ?Take 1 tablet (1,000 mcg total) by mouth daily. ?  ?white petrolatum ointment ?Apply topically 2 (two) times daily. ?  ?zinc oxide 20 % ointment ?Apply 1 application. topically as needed for irritation. To buttocks after every incontinent episode and as needed for redness. May keep at bedside. ?  ? ?  ? ? ?Review of Systems  ?Constitutional:  Negative for activity change and appetite change.  ?HENT: Negative.    ?Respiratory:  Negative for cough and shortness of breath.   ?Cardiovascular:  Positive for leg swelling.  ?Gastrointestinal:  Negative for constipation.  ?Genitourinary:  Positive for frequency.  ?Musculoskeletal:  Positive for gait problem. Negative for arthralgias and myalgias.  ?Skin: Negative.   ?Neurological:  Negative for dizziness and weakness.  ?Psychiatric/Behavioral:  Negative for confusion, dysphoric mood and sleep disturbance.   ? ?Immunization History  ?Administered Date(s) Administered  ? Fluad Quad(high Dose 65+) 05/28/2019  ? Influenza Split 07/12/2016, 08/13/2017, 06/02/2019, 09/09/2020  ? Influenza, High Dose Seasonal PF 06/11/2017, 06/28/2018  ? Influenza-Unspecified 06/21/2020, 07/25/2021  ? Moderna SARS-COV2 Booster Vaccination 03/08/2021  ? Moderna Sars-Covid-2 Vaccination 10/05/2019, 11/02/2019, 08/15/2020  ? PFIZER(Purple  Top)SARS-COV-2 Vaccination 06/21/2021  ? Pneumococcal Conjugate-13 11/22/2014  ? Pneumococcal Polysaccharide-23 04/29/2009, 08/13/2017  ? Td 01/24/2013  ? Tdap 12/28/2017, 07/01/2018, 12/13/2020  ? Zoster Recombinat (Shingrix) 07/01/2018  ? Zoster, Live 11/12/2009, 07/01/2018  ? Zoster, Unspecified 12/17/2005  ? ?Pertinent  Health Maintenance Due  ?Topic Date Due  ? INFLUENZA VACCINE  05/01/2022  ? DEXA SCAN  Completed  ? ? ?  09/18/2021  ? 12:53 PM 10/16/2021  ? 12:02 PM 11/16/2021  ? 10:51 AM 12/19/2021  ?  2:02 PM 01/24/2022  ?  2:25 PM  ?Fall Risk  ?Patient Fall Risk Level High fall risk High fall risk High fall risk High fall risk High fall risk  ? ?Functional Status Survey: ?  ? ?Vitals:  ? 02/08/22 1145  ?BP: 124/70  ?Pulse: 70  ?Resp: 16  ?Temp: Marland Kitchen)  97 ?F (36.1 ?C)  ?SpO2: 96%  ?Weight: 132 lb 6.4 oz (60.1 kg)  ?Height: '5\' 6"'$  (1.676 m)  ? ?Body mass index is 21.37 kg/m?Marland Kitchen ?Physical Exam ?Vitals reviewed.  ?Constitutional:   ?   Appearance: Normal appearance.  ?HENT:  ?   Head: Normocephalic.  ?   Nose: Nose normal.  ?   Mouth/Throat:  ?   Mouth: Mucous membranes are moist.  ?   Pharynx: Oropharynx is clear.  ?Eyes:  ?   Pupils: Pupils are equal, round, and reactive to light.  ?Cardiovascular:  ?   Rate and Rhythm: Normal rate and regular rhythm.  ?   Pulses: Normal pulses.  ?   Heart sounds: Normal heart sounds. No murmur heard. ?Pulmonary:  ?   Effort: Pulmonary effort is normal.  ?   Breath sounds: Normal breath sounds.  ?Abdominal:  ?   General: Abdomen is flat. Bowel sounds are normal.  ?   Palpations: Abdomen is soft.  ?Musculoskeletal:     ?   General: No swelling.  ?   Cervical back: Neck supple.  ?   Comments: Mild edema Bilateral  ?Skin: ?   General: Skin is warm.  ?Neurological:  ?   General: No focal deficit present.  ?   Mental Status: She is alert and oriented to person, place, and time.  ?Psychiatric:     ?   Mood and Affect: Mood normal.     ?   Thought Content: Thought content normal.  ? ? ?Labs  reviewed: ?Recent Labs  ?  11/16/21 ?1020 12/19/21 ?1333 01/24/22 ?1340  ?NA 138 141 143  ?K 4.1 4.5 4.9  ?CL 106 107 110  ?CO2 '24 25 25  '$ ?GLUCOSE 103* 105* 95  ?BUN 40* 44* 45*  ?CREATININE 1.78* 1.87* 2.14*  ?CALCIUM 8.5* 8.

## 2022-02-27 ENCOUNTER — Non-Acute Institutional Stay: Payer: Medicare Other | Admitting: Internal Medicine

## 2022-02-27 ENCOUNTER — Encounter: Payer: Self-pay | Admitting: Internal Medicine

## 2022-02-27 DIAGNOSIS — G500 Trigeminal neuralgia: Secondary | ICD-10-CM | POA: Diagnosis not present

## 2022-02-27 DIAGNOSIS — R0602 Shortness of breath: Secondary | ICD-10-CM

## 2022-02-27 NOTE — Progress Notes (Signed)
Location:   [facility] Room Number: 9 Place of Service:  ALF [phone]) Provider:  Veleta Miners MD  Virgie Dad, MD  Patient Care Team: Virgie Dad, MD as PCP - General (Internal Medicine)  Extended Emergency Contact Information Primary Emergency Contact: Genia Plants Address: [address]          [place], [place] [address] Johnnette Litter of [place] Phone: [phone] Relation: Alanson Puls Secondary Emergency Contact: [name] Address: [address]          [place], [place] [address] Johnnette Litter of [place] Phone: [phone] Mobile Phone: [phone] Relation: Nephew  Code Status:  Full code Goals of care: Advanced Directive information    02/27/2022   11:01 AM  Advanced Directives  Does Patient Have a Medical Advance Directive? Yes  Type of Paramedic of [place];Living will  Does patient want to make changes to medical advance directive? No - Patient declined  Copy of [place] in Chart? Yes - validated most recent copy scanned in chart (See row information)     Chief Complaint  Patient presents with   Acute Visit    Trigeminal pain     HPI:  Pt is a 88 y.o. female seen today for an acute visit for Trigeminal Pain and SOB  Patient has a history of Relapsed Marginal Zone  lymphoma followed by oncology now on Oral therapy  osteoporosis,   hypothyroidism,  trigeminal neuralgia and migraines   H/o  frontoparietal subarachnoid hemorrhage and Rib fractures LE edema mild   Acute issue today Trigeminal Neuralgia Pain Getting worse recently It hurts on left side of her face Allergic to Cabapenzine On High dose of Neurontin  SOB No Cough or chest poain No PND Dyspnea on Exertion Wt Readings from Last 3 Encounters:  02/27/22 132 lb 6.4 oz (60.1 kg)  02/08/22 132 lb 6.4 oz (60.1 kg)  01/24/22 135 lb 12.8 oz (61.6 kg)     Past Medical History:  Diagnosis Date   Cancer ([facility])     non hodgkin lymphoma   Chronic lymphocytic leukemia ([facility])    History of B-cell lymphoma 11/02/2015   Migraines    Osteoporosis    Thyroid disease    Trigeminal neuralgia of left side of face    Past Surgical History:  Procedure Laterality Date   gamma knife     for trigeminal neuralgia   SHOULDER SURGERY     Right shoulder replacement   TONSILLECTOMY AND ADENOIDECTOMY     WISDOM TOOTH EXTRACTION      Allergies  Allergen Reactions   Carbamazepine Hives and Other (See Comments)    Turned purple from neck down and drowsy    Penicillins Hives   Pregabalin Other (See Comments)    Drowsy    Allergies as of 02/27/2022       Reactions   Carbamazepine Hives, Other (See Comments)   Turned purple from neck down and drowsy   Penicillins Hives   Pregabalin Other (See Comments)   Drowsy        Medication List        Accurate as of Feb 27, 2022 11:02 AM. If you have any questions, ask your nurse or doctor.          acetaminophen 500 MG tablet Commonly known as: TYLENOL Take 500 mg by mouth every 6 (six) hours as needed.   acetaminophen 500 MG tablet Commonly known as: TYLENOL Take 500 mg  by mouth 3 (three) times daily.   aspirin 81 MG chewable tablet Chew 81 mg by mouth daily.   Calquence 100 MG Tabs Generic drug: Acalabrutinib Maleate Take 100 mg by mouth daily.   famciclovir 125 MG tablet Commonly known as: FAMVIR Take 250 mg by mouth daily. Two tablets   gabapentin 600 MG tablet Commonly known as: NEURONTIN Take 600 mg by mouth 3 (three) times daily.   lactose free nutrition Liqd Take 237 mLs by mouth daily.   levothyroxine 100 MCG tablet Commonly known as: SYNTHROID Take 112 mcg by mouth daily before breakfast.   methocarbamol 500 MG tablet Commonly known as: ROBAXIN Take 250 mg by mouth daily.   Multi-Vitamins Tabs Take 1 tablet by mouth daily.   pantoprazole 40 MG tablet Commonly known as: PROTONIX Take 40 mg by mouth daily.   polyvinyl  alcohol 1.4 % ophthalmic solution Commonly known as: LIQUIFILM TEARS Place 1 drop into both eyes 4 (four) times daily.   Pro-Stat Liqd Take 30 mLs by mouth daily.   simethicone 125 MG chewable tablet Commonly known as: MYLICON Chew 948 mg by mouth every 6 (six) hours as needed for flatulence.   torsemide 10 MG tablet Commonly known as: DEMADEX 10 mg daily as needed.   vitamin B-12 1000 MCG tablet Commonly known as: CYANOCOBALAMIN Take 1 tablet (1,000 mcg total) by mouth daily.   white petrolatum ointment Apply topically 2 (two) times daily.   zinc oxide 20 % ointment Apply 1 application. topically as needed for irritation. To buttocks after every incontinent episode and as needed for redness. May keep at bedside.        Review of Systems  Constitutional:  Positive for activity change.  HENT: Negative.    Cardiovascular:  Positive for leg swelling.  Gastrointestinal: Negative.   Genitourinary:  Positive for frequency.  Musculoskeletal:  Positive for gait problem.  Skin: Negative.   Neurological:  Positive for weakness.  Psychiatric/Behavioral: Negative.     Immunization History  Administered Date(s) Administered   Fluad Quad(high Dose 65+) 05/28/2019   Influenza Split 07/12/2016, 08/13/2017, 06/02/2019, 09/09/2020   Influenza, High Dose Seasonal PF 06/11/2017, 06/28/2018   Influenza-Unspecified 06/21/2020, 07/25/2021   Moderna SARS-COV2 Booster Vaccination 03/08/2021   Moderna Sars-Covid-2 Vaccination 10/05/2019, 11/02/2019, 08/15/2020   PFIZER(Purple Top)SARS-COV-2 Vaccination 06/21/2021   Pneumococcal Conjugate-13 11/22/2014   Pneumococcal Polysaccharide-23 04/29/2009, 08/13/2017   Td 01/24/2013   Tdap 12/28/2017, 07/01/2018, 12/13/2020   Zoster Recombinat (Shingrix) 07/01/2018   Zoster, Live 11/12/2009, 07/01/2018   Zoster, Unspecified 12/17/2005   Pertinent  Health Maintenance Due  Topic Date Due   INFLUENZA VACCINE  05/01/2022   DEXA SCAN  Completed       09/18/2021   12:53 PM 10/16/2021   12:02 PM 11/16/2021   10:51 AM 12/19/2021    2:02 PM 01/24/2022    2:25 PM  Fall Risk  Patient Fall Risk Level High fall risk High fall risk High fall risk High fall risk High fall risk   Functional Status Survey:    Vitals:   02/27/22 1046  BP: 91/60  Pulse: (!) 112  Resp: 17  Temp: (!) 97.3 F (36.3 C)  SpO2: 95%  Weight: 132 lb 6.4 oz (60.1 kg)  Height: '5\' 6"'$  (1.676 m)   Body mass index is 21.37 kg/m. Physical Exam Vitals reviewed.  Constitutional:      Appearance: Normal appearance.  HENT:     Head: Normocephalic.     Nose: Nose normal.  Mouth/Throat:     Mouth: Mucous membranes are moist.     Pharynx: Oropharynx is clear.  Eyes:     Pupils: Pupils are equal, round, and reactive to light.  Cardiovascular:     Rate and Rhythm: Normal rate and regular rhythm.     Pulses: Normal pulses.     Heart sounds: Normal heart sounds. No murmur heard. Pulmonary:     Effort: Pulmonary effort is normal.     Breath sounds: Normal breath sounds.     Comments: Few rales in the bases Abdominal:     General: Abdomen is flat. Bowel sounds are normal.     Palpations: Abdomen is soft.  Musculoskeletal:        General: Swelling present.     Cervical back: Neck supple.     Comments: Mild Swelling bilateral  Skin:    General: Skin is warm.  Neurological:     General: No focal deficit present.     Mental Status: She is alert and oriented to person, place, and time.  Psychiatric:        Mood and Affect: Mood normal.        Thought Content: Thought content normal.    Labs reviewed: Recent Labs    11/16/21 1020 12/19/21 1333 01/24/22 1340  NA 138 141 143  K 4.1 4.5 4.9  CL 106 107 110  CO2 '24 25 25  '$ GLUCOSE 103* 105* 95  BUN 40* 44* 45*  CREATININE 1.78* 1.87* 2.14*  CALCIUM 8.5* 8.8* 8.9   Recent Labs    11/16/21 1020 12/19/21 1333 01/24/22 1340  AST '17 19 15  '$ ALT '8 9 8  '$ ALKPHOS 45 48 46  BILITOT 0.3 0.3 0.3  PROT  5.6* 5.8* 6.0*  ALBUMIN 2.6* 2.8* 2.9*   Recent Labs    11/16/21 1020 12/19/21 1333 01/24/22 1340  WBC 12.2* 10.8* 14.3*  NEUTROABS 3.2 3.9 6.8  HGB 9.6* 9.7* 10.0*  HCT 29.9* 29.8* 31.0*  MCV 107.2* 104.2* 103.3*  PLT 153 154 157   Lab Results  Component Value Date   TSH 27.99 (A) 09/08/2020   No results found for: HGBA1C No results found for: CHOL, HDL, LDLCALC, LDLDIRECT, TRIG, CHOLHDL  Significant Diagnostic Results in last 30 days:  No results found.  Assessment/Plan Trigeminal neuralgia Allergic to Tegretol Cant do Baclofen due to her CKD Will try Zanaflex '2mg'$  QD Reval in few days If able to tolerate can take her off robaxin SOB (shortness of breath) on exertion Weight stable  Chest Xray Rule out CHF  Other issues Stage 4 chronic kidney disease (HCC) Creat mildly worse Will continue to monitor   Lymphoma, marginal zone, lymph nodes of multiple sites ([place]) On Calquence and Famciclovir Responding very well Palliative therapy   Gastroesophageal reflux disease, unspecified whether esophagitis present Increase her Protonix to BID   Acquired hypothyroidism TSH normal in 12/22   Bilateral leg edema Stable   Anemia due to stage 4 chronic kidney disease (HCC) Hgb stable   Urinary frequency Cut back her Fluid intake with Supper especially Tea   Chronic Low Back pain On Neurontin Tylenol and Robaxin  Family/ staff Communication:   Labs/tests ordered:

## 2022-03-06 ENCOUNTER — Non-Acute Institutional Stay: Payer: Medicare Other | Admitting: Adult Health

## 2022-03-06 ENCOUNTER — Encounter: Payer: Self-pay | Admitting: Adult Health

## 2022-03-06 DIAGNOSIS — C8588 Other specified types of non-Hodgkin lymphoma, lymph nodes of multiple sites: Secondary | ICD-10-CM

## 2022-03-06 DIAGNOSIS — M545 Low back pain, unspecified: Secondary | ICD-10-CM

## 2022-03-06 DIAGNOSIS — G8929 Other chronic pain: Secondary | ICD-10-CM

## 2022-03-06 DIAGNOSIS — G500 Trigeminal neuralgia: Secondary | ICD-10-CM | POA: Diagnosis not present

## 2022-03-06 DIAGNOSIS — J189 Pneumonia, unspecified organism: Secondary | ICD-10-CM

## 2022-03-06 NOTE — Progress Notes (Signed)
Location:  [facility] Room Number: [place] of Service:  ALF [phone]) Provider:  Durenda Age, DNP, FNP-BC  Patient Care Team: Virgie Dad, MD as PCP - General (Internal Medicine)  Extended Emergency Contact Information Primary Emergency Contact: Genia Plants Address: [address]          [place], [place] [address] Johnnette Litter of [place] Phone: [phone] Relation: Alanson Puls Secondary Emergency Contact: [name] Address: [address]          [place], [place] [address] Johnnette Litter of [place] Phone: [phone] Mobile Phone: [phone] Relation: Nephew  Code Status:  FULL CODE  Goals of care: Advanced Directive information    03/06/2022   10:02 AM  Advanced Directives  Does Patient Have a Medical Advance Directive? Yes  Type of Paramedic of [place];Living will  Does patient want to make changes to medical advance directive? No - Patient declined  Copy of [place] in Chart? Yes - validated most recent copy scanned in chart (See row information)     Chief Complaint  Patient presents with   Follow-up    Pneumonia, and No energy.      HPI:  Pt is a 88 y.o. female seen today for an acute visit to follow up pneumonia. She is currently taking Doxycycline 100 mg BID for pneumonia. Yesterday, she complained to charge nurse that she feels like she has no energy. She was seen in her room today. She stated that she feels better today. She denies coughing, fever nor chills. She currently takes Gabapentin 600 mg TID for low back pain. She takes Zanaflex 2 mg BID for trigeminal Neuralgia. She takes Calquence 100 mg daily and Famciclovir 125 mg 2 tabs = 250 mg Q AM for Lymphoma.  Past Medical History:  Diagnosis Date   Cancer [facility])    non hodgkin lymphoma   Chronic lymphocytic leukemia (HCC)    History of B-cell lymphoma 11/02/2015   Migraines    Osteoporosis    Thyroid disease     Trigeminal neuralgia of left side of face    Past Surgical History:  Procedure Laterality Date   gamma knife     for trigeminal neuralgia   SHOULDER SURGERY     Right shoulder replacement   TONSILLECTOMY AND ADENOIDECTOMY     WISDOM TOOTH EXTRACTION      Allergies  Allergen Reactions   Penicillins Hives   Tegretol [Carbamazepine] Hives and Other (See Comments)    Turned purple from neck down and drowsy    Lyrica [Pregabalin] Other (See Comments)    Drowsy    Outpatient Encounter Medications as of 03/06/2022  Medication Sig   Acalabrutinib Maleate (CALQUENCE) 100 MG TABS Take 100 mg by mouth daily.   acetaminophen (TYLENOL) 500 MG tablet Take 500 mg by mouth every 6 (six) hours as needed.   acetaminophen (TYLENOL) 500 MG tablet Take 500 mg by mouth 3 (three) times daily.   Amino Acids-Protein Hydrolys (PRO-STAT) LIQD Take 30 mLs by mouth daily.   aspirin 81 MG chewable tablet Chew 81 mg by mouth daily.   doxycycline (VIBRAMYCIN) 100 MG capsule Take 100 mg by mouth 2 (two) times daily.   famciclovir (FAMVIR) 125 MG tablet Take 250 mg by mouth daily. Two tablets   gabapentin (NEURONTIN) 600 MG tablet Take 600 mg by mouth 3 (three) times daily.   lactose free nutrition (BOOST) LIQD Take 237 mLs by mouth daily.  levothyroxine (SYNTHROID) 100 MCG tablet Take 112 mcg by mouth daily before breakfast.   Multiple Vitamin (MULTI-VITAMINS) TABS Take 1 tablet by mouth daily.   pantoprazole (PROTONIX) 40 MG tablet Take 40 mg by mouth daily.   polyvinyl alcohol (LIQUIFILM TEARS) 1.4 % ophthalmic solution Place 1 drop into both eyes 4 (four) times daily as needed.   simethicone (MYLICON) 456 MG chewable tablet Chew 125 mg by mouth 2 (two) times daily as needed for flatulence.   tizanidine (ZANAFLEX) 2 MG capsule Take 2 mg by mouth 2 (two) times daily.   torsemide (DEMADEX) 10 MG tablet 10 mg daily as needed.   vitamin B-12 (CYANOCOBALAMIN) 1000 MCG tablet Take 1 tablet (1,000 mcg total) by  mouth daily.   white petrolatum ointment Apply topically 2 (two) times daily.   zinc oxide 20 % ointment Apply 1 application. topically as needed for irritation. To buttocks after every incontinent episode and as needed for redness. May keep at bedside.   methocarbamol (ROBAXIN) 500 MG tablet Take 250 mg by mouth daily.   No facility-administered encounter medications on file as of 03/06/2022.    Review of Systems  Constitutional:  Negative for appetite change, chills, fatigue and fever.  HENT:  Negative for congestion, hearing loss, rhinorrhea and sore throat.   Eyes: Negative.   Respiratory:  Negative for cough, shortness of breath and wheezing.   Cardiovascular:  Negative for chest pain, palpitations and leg swelling.  Gastrointestinal:  Negative for abdominal pain, constipation, diarrhea, nausea and vomiting.  Genitourinary:  Negative for dysuria.  Musculoskeletal:  Negative for arthralgias, back pain and myalgias.  Skin:  Negative for color change, rash and wound.  Neurological:  Negative for dizziness, weakness and headaches.  Psychiatric/Behavioral:  Negative for behavioral problems. The patient is not nervous/anxious.       Immunization History  Administered Date(s) Administered   Fluad Quad(high Dose 65+) 05/28/2019   Influenza Split 07/12/2016, 08/13/2017, 06/02/2019, 09/09/2020   Influenza, High Dose Seasonal PF 06/11/2017, 06/28/2018   Influenza-Unspecified 06/21/2020, 07/25/2021   Moderna SARS-COV2 Booster Vaccination 03/08/2021   Moderna Sars-Covid-2 Vaccination 10/05/2019, 11/02/2019, 08/15/2020   PFIZER(Purple Top)SARS-COV-2 Vaccination 06/21/2021   Pneumococcal Conjugate-13 11/22/2014   Pneumococcal Polysaccharide-23 04/29/2009, 08/13/2017   Td 01/24/2013   Tdap 12/28/2017, 07/01/2018, 12/13/2020   Zoster Recombinat (Shingrix) 07/01/2018   Zoster, Live 11/12/2009, 07/01/2018   Zoster, Unspecified 12/17/2005   Pertinent  Health Maintenance Due  Topic Date Due    INFLUENZA VACCINE  05/01/2022   DEXA SCAN  Completed      09/18/2021   12:53 PM 10/16/2021   12:02 PM 11/16/2021   10:51 AM 12/19/2021    2:02 PM 01/24/2022    2:25 PM  Fall Risk  Patient Fall Risk Level High fall risk High fall risk High fall risk High fall risk High fall risk     Vitals:   03/06/22 0951  BP: (!) 146/80  Pulse: (!) 106  Resp: 18  Temp: (!) 97 F (36.1 C)  SpO2: 91%  Weight: 131 lb 6.4 oz (59.6 kg)  Height: '5\' 6"'$  (1.676 m)   Body mass index is 21.21 kg/m.  Physical Exam Constitutional:      Appearance: Normal appearance.  HENT:     Head: Normocephalic and atraumatic.     Nose: Nose normal.     Mouth/Throat:     Mouth: Mucous membranes are moist.  Eyes:     Conjunctiva/sclera: Conjunctivae normal.  Cardiovascular:     Rate and Rhythm:  Normal rate and regular rhythm.  Pulmonary:     Effort: Pulmonary effort is normal.     Breath sounds: Normal breath sounds.  Abdominal:     General: Bowel sounds are normal.     Palpations: Abdomen is soft.  Musculoskeletal:        General: Normal range of motion.     Cervical back: Normal range of motion.     Comments: BLE trace edema  Skin:    General: Skin is warm and dry.  Neurological:     General: No focal deficit present.     Mental Status: She is alert and oriented to person, place, and time.  Psychiatric:        Mood and Affect: Mood normal.        Behavior: Behavior normal.        Thought Content: Thought content normal.        Judgment: Judgment normal.       Labs reviewed: Recent Labs    11/16/21 1020 12/19/21 1333 01/24/22 1340  NA 138 141 143  K 4.1 4.5 4.9  CL 106 107 110  CO2 '24 25 25  '$ GLUCOSE 103* 105* 95  BUN 40* 44* 45*  CREATININE 1.78* 1.87* 2.14*  CALCIUM 8.5* 8.8* 8.9   Recent Labs    11/16/21 1020 12/19/21 1333 01/24/22 1340  AST '17 19 15  '$ ALT '8 9 8  '$ ALKPHOS 45 48 46  BILITOT 0.3 0.3 0.3  PROT 5.6* 5.8* 6.0*  ALBUMIN 2.6* 2.8* 2.9*   Recent Labs     11/16/21 1020 12/19/21 1333 01/24/22 1340  WBC 12.2* 10.8* 14.3*  NEUTROABS 3.2 3.9 6.8  HGB 9.6* 9.7* 10.0*  HCT 29.9* 29.8* 31.0*  MCV 107.2* 104.2* 103.3*  PLT 153 154 157   Lab Results  Component Value Date   TSH 27.99 (A) 09/08/2020   No results found for: HGBA1C No results found for: CHOL, HDL, LDLCALC, LDLDIRECT, TRIG, CHOLHDL  Significant Diagnostic Results in last 30 days:  No results found.  Assessment/Plan  1. Pneumonia of left lower lobe due to infectious organism -   feels a lot better today, no cough fever, SOB -  continue Doxycycline  2. Trigeminal neuralgia -   stable, continue Gabapentin and Zanaflex  3. Lymphoma, marginal zone, lymph nodes of multiple sites ([facility]) -  stable, continue Calquence and Famciclovir -  follows up with oncology  4. Chronic low back pain without sciatica, unspecified back pain laterality -  stable, continue Gabapentin    Family/ staff Communication: Discussed plan of care with resident and charge nurse.  Labs/tests ordered:  ordered BMP with GFR and CBC with differentials    Durenda Age, DNP, MSN, FNP-BC [place] [phone] (Monday-Friday 8:00 a.m. - 5:00 p.m.) [phone] (after hours)

## 2022-03-08 ENCOUNTER — Encounter: Payer: Self-pay | Admitting: Internal Medicine

## 2022-03-08 ENCOUNTER — Non-Acute Institutional Stay: Payer: Medicare Other | Admitting: Internal Medicine

## 2022-03-08 DIAGNOSIS — G500 Trigeminal neuralgia: Secondary | ICD-10-CM

## 2022-03-08 DIAGNOSIS — E039 Hypothyroidism, unspecified: Secondary | ICD-10-CM

## 2022-03-08 DIAGNOSIS — R5383 Other fatigue: Secondary | ICD-10-CM

## 2022-03-08 DIAGNOSIS — J189 Pneumonia, unspecified organism: Secondary | ICD-10-CM | POA: Diagnosis not present

## 2022-03-08 DIAGNOSIS — C8588 Other specified types of non-Hodgkin lymphoma, lymph nodes of multiple sites: Secondary | ICD-10-CM

## 2022-03-08 DIAGNOSIS — N184 Chronic kidney disease, stage 4 (severe): Secondary | ICD-10-CM

## 2022-03-08 NOTE — Progress Notes (Signed)
Location:   [facility] Room Number: 09 Place of Service:  ALF [phone]) Provider:  Veleta Miners MD   Virgie Dad, MD  Patient Care Team: Virgie Dad, MD as PCP - General (Internal Medicine)  Extended Emergency Contact Information Primary Emergency Contact: Genia Plants Address: [address]          [place], [place] [address] Johnnette Litter of [place] Phone: [phone] Relation: Alanson Puls Secondary Emergency Contact: [name] Address: [address]          [place], [place] [address] Johnnette Litter of [place] Phone: [phone] Mobile Phone: [phone] Relation: Nephew  Code Status:  Full Code Managed Care Goals of care: Advanced Directive information    03/08/2022   11:21 AM  Advanced Directives  Does Patient Have a Medical Advance Directive? Yes  Type of Paramedic of [place];Living will  Does patient want to make changes to medical advance directive? No - Patient declined  Copy of [place] in Chart? Yes - validated most recent copy scanned in chart (See row information)     Chief Complaint  Patient presents with   Acute Visit    Neuralgia pain     HPI:  Pt is a 88 y.o. female seen today for an acute visit for Lethargy and Neuralgia pain  Lives inAL  Patient has a history of Relapsed Marginal Zone  lymphoma followed by oncology now on Oral therapy  osteoporosis,   hypothyroidism,  trigeminal neuralgia and migraines   H/o  frontoparietal subarachnoid hemorrhage and Rib fractures LE edema mild  Acute issue She was prescribed Zanaflex for sever pain due to her Neuralgia but know she is feeling weak and sleeping more Continues to have Neuralgia pain Not relieved with high dose of Neurontin Or tylenol Allergic to Carbamazepine SOB Was diagnosed with Left Lower Lobe Pneumonia On Doxycyline for 1 week Her SOB is better But still there      Past Medical History:  Diagnosis  Date   Cancer ([facility])    non hodgkin lymphoma   Chronic lymphocytic leukemia ([place])    History of B-cell lymphoma 11/02/2015   Migraines    Osteoporosis    Thyroid disease    Trigeminal neuralgia of left side of face    Past Surgical History:  Procedure Laterality Date   gamma knife     for trigeminal neuralgia   SHOULDER SURGERY     Right shoulder replacement   TONSILLECTOMY AND ADENOIDECTOMY     WISDOM TOOTH EXTRACTION      Allergies  Allergen Reactions   Penicillins Hives   Tegretol [Carbamazepine] Hives and Other (See Comments)    Turned purple from neck down and drowsy    Lyrica [Pregabalin] Other (See Comments)    Drowsy    Allergies as of 03/08/2022       Reactions   Penicillins Hives   Tegretol [carbamazepine] Hives, Other (See Comments)   Turned purple from neck down and drowsy   Lyrica [pregabalin] Other (See Comments)   Drowsy        Medication List        Accurate as of March 08, 2022 11:21 AM. If you have any questions, ask your nurse or doctor.          STOP taking these medications    doxycycline 100 MG capsule Commonly known as: VIBRAMYCIN Stopped by: Virgie Dad, MD   methocarbamol 500 MG tablet Commonly known  as: ROBAXIN Stopped by: Virgie Dad, MD       TAKE these medications    acetaminophen 500 MG tablet Commonly known as: TYLENOL Take 500 mg by mouth every 6 (six) hours as needed.   acetaminophen 500 MG tablet Commonly known as: TYLENOL Take 500 mg by mouth 3 (three) times daily.   aspirin 81 MG chewable tablet Chew 81 mg by mouth daily.   Calquence 100 MG Tabs Generic drug: Acalabrutinib Maleate Take 100 mg by mouth daily.   famciclovir 125 MG tablet Commonly known as: FAMVIR Take 250 mg by mouth daily. Two tablets   gabapentin 600 MG tablet Commonly known as: NEURONTIN Take 600 mg by mouth 3 (three) times daily.   lactose free nutrition Liqd Take 237 mLs by mouth daily.   levothyroxine 100 MCG  tablet Commonly known as: SYNTHROID Take 112 mcg by mouth daily before breakfast.   Multi-Vitamins Tabs Take 1 tablet by mouth daily.   pantoprazole 40 MG tablet Commonly known as: PROTONIX Take 40 mg by mouth 2 (two) times daily.   polyvinyl alcohol 1.4 % ophthalmic solution Commonly known as: LIQUIFILM TEARS Place 1 drop into both eyes 4 (four) times daily as needed.   Pro-Stat Liqd Take 30 mLs by mouth daily.   simethicone 125 MG chewable tablet Commonly known as: MYLICON Chew 536 mg by mouth 2 (two) times daily as needed for flatulence.   tizanidine 2 MG capsule Commonly known as: ZANAFLEX Take 2 mg by mouth 2 (two) times daily.   torsemide 10 MG tablet Commonly known as: DEMADEX 10 mg daily as needed.   vitamin B-12 1000 MCG tablet Commonly known as: CYANOCOBALAMIN Take 1 tablet (1,000 mcg total) by mouth daily.   white petrolatum ointment Apply topically 2 (two) times daily.   zinc oxide 20 % ointment Apply 1 application. topically as needed for irritation. To buttocks after every incontinent episode and as needed for redness. May keep at bedside.        Review of Systems  Constitutional:  Positive for activity change. Negative for appetite change.  HENT: Negative.    Respiratory:  Positive for shortness of breath. Negative for cough.   Cardiovascular:  Negative for leg swelling.  Gastrointestinal:  Negative for constipation.  Genitourinary: Negative.   Musculoskeletal:  Negative for arthralgias, gait problem and myalgias.  Skin: Negative.   Neurological:  Positive for weakness. Negative for dizziness.       Facial Pain  Psychiatric/Behavioral:  Negative for confusion, dysphoric mood and sleep disturbance.     Immunization History  Administered Date(s) Administered   Fluad Quad(high Dose 65+) 05/28/2019   Influenza Split 07/12/2016, 08/13/2017, 06/02/2019, 09/09/2020   Influenza, High Dose Seasonal PF 06/11/2017, 06/28/2018   Influenza-Unspecified  06/21/2020, 07/25/2021   Moderna SARS-COV2 Booster Vaccination 03/08/2021   Moderna Sars-Covid-2 Vaccination 10/05/2019, 11/02/2019, 08/15/2020   PFIZER(Purple Top)SARS-COV-2 Vaccination 06/21/2021   Pneumococcal Conjugate-13 11/22/2014   Pneumococcal Polysaccharide-23 04/29/2009, 08/13/2017   Td 01/24/2013   Tdap 12/28/2017, 07/01/2018, 12/13/2020   Zoster Recombinat (Shingrix) 07/01/2018   Zoster, Live 11/12/2009, 07/01/2018   Zoster, Unspecified 12/17/2005   Pertinent  Health Maintenance Due  Topic Date Due   INFLUENZA VACCINE  05/01/2022   DEXA SCAN  Completed      09/18/2021   12:53 PM 10/16/2021   12:02 PM 11/16/2021   10:51 AM 12/19/2021    2:02 PM 01/24/2022    2:25 PM  Fall Risk  Patient Fall Risk Level High fall risk  High fall risk High fall risk High fall risk High fall risk   Functional Status Survey:    Vitals:   03/08/22 1110  BP: (!) 144/82  Pulse: (!) 101  Resp: 16  Temp: (!) 97 F (36.1 C)  SpO2: 91%  Weight: 131 lb 6.4 oz (59.6 kg)  Height: '5\' 6"'$  (1.676 m)   Body mass index is 21.21 kg/m. Physical Exam Vitals reviewed.  Constitutional:      Appearance: Normal appearance.  HENT:     Head: Normocephalic.     Nose: Nose normal.     Mouth/Throat:     Mouth: Mucous membranes are moist.     Pharynx: Oropharynx is clear.  Eyes:     Pupils: Pupils are equal, round, and reactive to light.  Cardiovascular:     Rate and Rhythm: Normal rate and regular rhythm.     Pulses: Normal pulses.     Heart sounds: Normal heart sounds. No murmur heard. Pulmonary:     Effort: Pulmonary effort is normal.     Breath sounds: Normal breath sounds.  Abdominal:     General: Abdomen is flat. Bowel sounds are normal.     Palpations: Abdomen is soft.  Musculoskeletal:        General: No swelling.     Cervical back: Neck supple.  Skin:    General: Skin is warm.  Neurological:     General: No focal deficit present.     Mental Status: She is alert and oriented to  person, place, and time.  Psychiatric:        Mood and Affect: Mood normal.        Thought Content: Thought content normal.     Labs reviewed: Recent Labs    11/16/21 1020 12/19/21 1333 01/24/22 1340  NA 138 141 143  K 4.1 4.5 4.9  CL 106 107 110  CO2 '24 25 25  '$ GLUCOSE 103* 105* 95  BUN 40* 44* 45*  CREATININE 1.78* 1.87* 2.14*  CALCIUM 8.5* 8.8* 8.9   Recent Labs    11/16/21 1020 12/19/21 1333 01/24/22 1340  AST '17 19 15  '$ ALT '8 9 8  '$ ALKPHOS 45 48 46  BILITOT 0.3 0.3 0.3  PROT 5.6* 5.8* 6.0*  ALBUMIN 2.6* 2.8* 2.9*   Recent Labs    11/16/21 1020 12/19/21 1333 01/24/22 1340  WBC 12.2* 10.8* 14.3*  NEUTROABS 3.2 3.9 6.8  HGB 9.6* 9.7* 10.0*  HCT 29.9* 29.8* 31.0*  MCV 107.2* 104.2* 103.3*  PLT 153 154 157   Lab Results  Component Value Date   TSH 10.52 (A) 01/05/2022   No results found for: "HGBA1C" No results found for: "CHOL", "HDL", "LDLCALC", "LDLDIRECT", "TRIG", "CHOLHDL"  Significant Diagnostic Results in last 30 days:  No results found.  Assessment/Plan 1. Lethargy Discontinue Zeneflex Labs done today were all good   2. Trigeminal neuralgia Cant go up on Neurontin as on high doses and has CKD Will try Tramdol PRN for 2 weeks to control Pain   3. Pneumonia of left lower lobe due to infectious organism Finsihed Doxycyline Still has some SOB   4. Lymphoma, marginal zone, lymph nodes of multiple sites [facility]) On Chemo Per Oncology  5. Stage 4 chronic kidney disease (HCC) Creat stable  6. Acquired hypothyroidism Elevated TSH Repeat TSH     Family/ staff Communication:   Labs/tests ordered:   TSH

## 2022-03-09 ENCOUNTER — Other Ambulatory Visit: Payer: Self-pay

## 2022-03-09 NOTE — Telephone Encounter (Signed)
Joy Lynch sent over fax for patient to take Tramadol '50mg'$  ( 1/2 tablet by mouth every 6hrs as needed for 14 days) medication pend and sent to PCP Virgie Dad, MD for approval. Please Advise.

## 2022-03-14 ENCOUNTER — Inpatient Hospital Stay: Payer: Medicare Other

## 2022-03-14 ENCOUNTER — Inpatient Hospital Stay: Payer: Medicare Other | Admitting: Family

## 2022-03-19 ENCOUNTER — Encounter: Payer: Self-pay | Admitting: Internal Medicine

## 2022-03-19 NOTE — Progress Notes (Signed)
A user error has taken place.

## 2022-03-20 ENCOUNTER — Encounter: Payer: Self-pay | Admitting: Adult Health

## 2022-03-20 ENCOUNTER — Non-Acute Institutional Stay: Payer: Medicare Other | Admitting: Adult Health

## 2022-03-20 DIAGNOSIS — E039 Hypothyroidism, unspecified: Secondary | ICD-10-CM

## 2022-03-20 DIAGNOSIS — G500 Trigeminal neuralgia: Secondary | ICD-10-CM | POA: Diagnosis not present

## 2022-03-20 NOTE — Progress Notes (Signed)
Location:  [facility] Room Number: 9 A Place of Service:  ALF [phone]) Provider:  Durenda Age, DNP, FNP-BC  Patient Care Team: Virgie Dad, MD as PCP - General (Internal Medicine)  Extended Emergency Contact Information Primary Emergency Contact: Genia Plants Address: [address]          [place], [place] [address] Johnnette Litter of [place] Phone: [phone] Relation: Alanson Puls Secondary Emergency Contact: [name] Address: [address]          [place], [place] [address] Johnnette Litter of [place] Phone: [phone] Mobile Phone: [phone] Relation: Nephew  Code Status:   Full Code  Goals of care: Advanced Directive information    03/08/2022   11:21 AM  Advanced Directives  Does Patient Have a Medical Advance Directive? Yes  Type of Paramedic of [place];Living will  Does patient want to make changes to medical advance directive? No - Patient declined  Copy of [place] in Chart? Yes - validated most recent copy scanned in chart (See row information)     Chief Complaint  Patient presents with   Acute Visit    Low tsh    HPI:  Pt is a 88 y.o. female seen today for an acute visit regarding low tsh0.07. She is currently taking Levothyroxine 112 mcg daily. Levothyroxine dosage was 100 mcg daily yesterday but was increased to 112 mcg yesterday. TSH level was taken yesterday.She denies having palpitations nor diarrhea. She complains of having no energy. She takes Gabapentin 600 mg TID for Trigeminal neuralgia. She complains of occasional sharp pain on her left face. She is a resident of [facility].   Past Medical History:  Diagnosis Date   Cancer [facility])    non hodgkin lymphoma   Chronic lymphocytic leukemia (HCC)    History of B-cell lymphoma 11/02/2015   Migraines    Osteoporosis    Thyroid disease    Trigeminal neuralgia of left side of face    Past Surgical History:   Procedure Laterality Date   gamma knife     for trigeminal neuralgia   SHOULDER SURGERY     Right shoulder replacement   TONSILLECTOMY AND ADENOIDECTOMY     WISDOM TOOTH EXTRACTION      Allergies  Allergen Reactions   Penicillins Hives   Tegretol [Carbamazepine] Hives and Other (See Comments)    Turned purple from neck down and drowsy    Lyrica [Pregabalin] Other (See Comments)    Drowsy    Outpatient Encounter Medications as of 03/20/2022  Medication Sig   Acalabrutinib Maleate (CALQUENCE) 100 MG TABS Take 100 mg by mouth daily.   acetaminophen (TYLENOL) 500 MG tablet Take 500 mg by mouth every 6 (six) hours as needed.   acetaminophen (TYLENOL) 500 MG tablet Take 500 mg by mouth 3 (three) times daily.   Amino Acids-Protein Hydrolys (PRO-STAT) LIQD Take 30 mLs by mouth daily.   aspirin 81 MG chewable tablet Chew 81 mg by mouth daily.   famciclovir (FAMVIR) 125 MG tablet Take 250 mg by mouth daily. Two tablets   gabapentin (NEURONTIN) 600 MG tablet Take 600 mg by mouth 3 (three) times daily.   lactose free nutrition (BOOST) LIQD Take 237 mLs by mouth daily.   levothyroxine (SYNTHROID) 100 MCG tablet Take 112 mcg by mouth daily before breakfast.   Multiple Vitamin (MULTI-VITAMINS) TABS Take 1 tablet by mouth daily.   pantoprazole (PROTONIX) 40 MG tablet Take 40  mg by mouth 2 (two) times daily.   polyvinyl alcohol (LIQUIFILM TEARS) 1.4 % ophthalmic solution Place 1 drop into both eyes 4 (four) times daily as needed.   simethicone (MYLICON) 062 MG chewable tablet Chew 125 mg by mouth 2 (two) times daily as needed for flatulence.   torsemide (DEMADEX) 10 MG tablet 10 mg daily as needed.   vitamin B-12 (CYANOCOBALAMIN) 1000 MCG tablet Take 1 tablet (1,000 mcg total) by mouth daily.   white petrolatum ointment Apply topically 2 (two) times daily.   zinc oxide 20 % ointment Apply 1 application. topically as needed for irritation. To buttocks after every incontinent episode and as needed  for redness. May keep at bedside.   No facility-administered encounter medications on file as of 03/20/2022.    Review of Systems  Constitutional:  Negative for appetite change, chills, fatigue and fever.  HENT:  Negative for congestion, hearing loss, rhinorrhea and sore throat.   Eyes: Negative.   Respiratory:  Negative for cough, shortness of breath and wheezing.   Cardiovascular:  Negative for chest pain, palpitations and leg swelling.  Gastrointestinal:  Negative for abdominal pain, constipation, diarrhea, nausea and vomiting.  Genitourinary:  Negative for dysuria.  Musculoskeletal:  Negative for arthralgias, back pain and myalgias.  Skin:  Negative for color change, rash and wound.  Neurological:  Negative for dizziness, weakness and headaches.  Psychiatric/Behavioral:  Negative for behavioral problems. The patient is not nervous/anxious.      Immunization History  Administered Date(s) Administered   Fluad Quad(high Dose 65+) 05/28/2019   Influenza Split 07/12/2016, 08/13/2017, 06/02/2019, 09/09/2020   Influenza, High Dose Seasonal PF 06/11/2017, 06/28/2018   Influenza-Unspecified 06/21/2020, 07/25/2021   Moderna SARS-COV2 Booster Vaccination 03/08/2021   Moderna Sars-Covid-2 Vaccination 10/05/2019, 11/02/2019, 08/15/2020   PFIZER(Purple Top)SARS-COV-2 Vaccination 06/21/2021   Pneumococcal Conjugate-13 11/22/2014   Pneumococcal Polysaccharide-23 04/29/2009, 08/13/2017   Td 01/24/2013   Tdap 12/28/2017, 07/01/2018, 12/13/2020   Zoster Recombinat (Shingrix) 07/01/2018   Zoster, Live 11/12/2009, 07/01/2018   Zoster, Unspecified 12/17/2005   Pertinent  Health Maintenance Due  Topic Date Due   INFLUENZA VACCINE  05/01/2022   DEXA SCAN  Completed      09/18/2021   12:53 PM 10/16/2021   12:02 PM 11/16/2021   10:51 AM 12/19/2021    2:02 PM 01/24/2022    2:25 PM  Fall Risk  Patient Fall Risk Level High fall risk High fall risk High fall risk High fall risk High fall risk      Vitals:   03/20/22 1000  BP: (!) 124/53  Pulse: 86  Resp: 16  Temp: 97.6 F (36.4 C)  Weight: 131 lb 6.4 oz (59.6 kg)  Height: '5\' 6"'$  (1.676 m)   Body mass index is 21.21 kg/m.  Physical Exam Constitutional:      Appearance: Normal appearance.  HENT:     Head: Normocephalic and atraumatic.     Nose: Nose normal.     Mouth/Throat:     Mouth: Mucous membranes are moist.  Eyes:     Conjunctiva/sclera: Conjunctivae normal.  Cardiovascular:     Rate and Rhythm: Normal rate and regular rhythm.  Pulmonary:     Effort: Pulmonary effort is normal.     Breath sounds: Normal breath sounds.  Abdominal:     General: Bowel sounds are normal.     Palpations: Abdomen is soft.  Musculoskeletal:        General: Normal range of motion.     Cervical back: Normal range  of motion.  Skin:    General: Skin is warm and dry.  Neurological:     General: No focal deficit present.     Mental Status: She is alert and oriented to person, place, and time.  Psychiatric:        Mood and Affect: Mood normal.        Behavior: Behavior normal.        Thought Content: Thought content normal.        Judgment: Judgment normal.      Labs reviewed: Recent Labs    11/16/21 1020 12/19/21 1333 01/24/22 1340  NA 138 141 143  K 4.1 4.5 4.9  CL 106 107 110  CO2 '24 25 25  '$ GLUCOSE 103* 105* 95  BUN 40* 44* 45*  CREATININE 1.78* 1.87* 2.14*  CALCIUM 8.5* 8.8* 8.9   Recent Labs    11/16/21 1020 12/19/21 1333 01/24/22 1340  AST '17 19 15  '$ ALT '8 9 8  '$ ALKPHOS 45 48 46  BILITOT 0.3 0.3 0.3  PROT 5.6* 5.8* 6.0*  ALBUMIN 2.6* 2.8* 2.9*   Recent Labs    11/16/21 1020 12/19/21 1333 01/24/22 1340  WBC 12.2* 10.8* 14.3*  NEUTROABS 3.2 3.9 6.8  HGB 9.6* 9.7* 10.0*  HCT 29.9* 29.8* 31.0*  MCV 107.2* 104.2* 103.3*  PLT 153 154 157   Lab Results  Component Value Date   TSH 10.52 (A) 01/05/2022   No results found for: "HGBA1C" No results found for: "CHOL", "HDL", "LDLCALC", "LDLDIRECT",  "TRIG", "CHOLHDL"  Significant Diagnostic Results in last 30 days:  No results found.  Assessment/Plan  1. Acquired hypothyroidism -  tsh 0.07, down from 1.18 -  will decrease Levothyroxine from 112 mcg to 88 mcg daily and repeat tsh in 6 weeks  2. Trigeminal neuralgia -    Continue gabapentin 600 mg TID    Family/ staff Communication: Discussed plan of care with resident and charge nurse.  Labs/tests ordered:   TSH in 6 weeks    Durenda Age, DNP, MSN, FNP-BC [place] [phone] (Monday-Friday 8:00 a.m. - 5:00 p.m.) [phone] (after hours)

## 2022-03-21 NOTE — Addendum Note (Signed)
Addended by: Durenda Age C on: 03/21/2022 12:37 AM   Modules accepted: Level of Service

## 2022-03-26 ENCOUNTER — Inpatient Hospital Stay: Payer: Medicare Other

## 2022-03-26 ENCOUNTER — Inpatient Hospital Stay (HOSPITAL_BASED_OUTPATIENT_CLINIC_OR_DEPARTMENT_OTHER): Payer: Medicare Other | Admitting: Family

## 2022-03-26 ENCOUNTER — Encounter: Payer: Self-pay | Admitting: Family

## 2022-03-26 ENCOUNTER — Inpatient Hospital Stay: Payer: Medicare Other | Attending: Hematology & Oncology

## 2022-03-26 VITALS — BP 98/47 | HR 108 | Temp 97.6°F | Resp 17 | Wt 123.1 lb

## 2022-03-26 DIAGNOSIS — E8809 Other disorders of plasma-protein metabolism, not elsewhere classified: Secondary | ICD-10-CM

## 2022-03-26 DIAGNOSIS — N183 Chronic kidney disease, stage 3 unspecified: Secondary | ICD-10-CM | POA: Diagnosis present

## 2022-03-26 DIAGNOSIS — D509 Iron deficiency anemia, unspecified: Secondary | ICD-10-CM | POA: Diagnosis not present

## 2022-03-26 DIAGNOSIS — C8588 Other specified types of non-Hodgkin lymphoma, lymph nodes of multiple sites: Secondary | ICD-10-CM

## 2022-03-26 DIAGNOSIS — C8300 Small cell B-cell lymphoma, unspecified site: Secondary | ICD-10-CM | POA: Insufficient documentation

## 2022-03-26 DIAGNOSIS — D631 Anemia in chronic kidney disease: Secondary | ICD-10-CM | POA: Diagnosis present

## 2022-03-26 DIAGNOSIS — Z8572 Personal history of non-Hodgkin lymphomas: Secondary | ICD-10-CM

## 2022-03-26 DIAGNOSIS — N1831 Chronic kidney disease, stage 3a: Secondary | ICD-10-CM

## 2022-03-26 DIAGNOSIS — D649 Anemia, unspecified: Secondary | ICD-10-CM | POA: Diagnosis not present

## 2022-03-26 DIAGNOSIS — Z79899 Other long term (current) drug therapy: Secondary | ICD-10-CM | POA: Diagnosis not present

## 2022-03-26 DIAGNOSIS — C8330 Diffuse large B-cell lymphoma, unspecified site: Secondary | ICD-10-CM | POA: Diagnosis present

## 2022-03-26 DIAGNOSIS — R531 Weakness: Secondary | ICD-10-CM | POA: Diagnosis not present

## 2022-03-26 DIAGNOSIS — D630 Anemia in neoplastic disease: Secondary | ICD-10-CM

## 2022-03-26 DIAGNOSIS — R0602 Shortness of breath: Secondary | ICD-10-CM | POA: Insufficient documentation

## 2022-03-26 DIAGNOSIS — R5383 Other fatigue: Secondary | ICD-10-CM | POA: Diagnosis not present

## 2022-03-26 LAB — CBC WITH DIFFERENTIAL (CANCER CENTER ONLY)
Abs Immature Granulocytes: 0.19 10*3/uL — ABNORMAL HIGH (ref 0.00–0.07)
Basophils Absolute: 0 10*3/uL (ref 0.0–0.1)
Basophils Relative: 0 %
Eosinophils Absolute: 0.2 10*3/uL (ref 0.0–0.5)
Eosinophils Relative: 1 %
HCT: 24.6 % — ABNORMAL LOW (ref 36.0–46.0)
Hemoglobin: 8 g/dL — ABNORMAL LOW (ref 12.0–15.0)
Immature Granulocytes: 2 %
Lymphocytes Relative: 31 %
Lymphs Abs: 4 10*3/uL (ref 0.7–4.0)
MCH: 32.9 pg (ref 26.0–34.0)
MCHC: 32.5 g/dL (ref 30.0–36.0)
MCV: 101.2 fL — ABNORMAL HIGH (ref 80.0–100.0)
Monocytes Absolute: 0.9 10*3/uL (ref 0.1–1.0)
Monocytes Relative: 7 %
Neutro Abs: 7.5 10*3/uL (ref 1.7–7.7)
Neutrophils Relative %: 59 %
Platelet Count: 204 10*3/uL (ref 150–400)
RBC: 2.43 MIL/uL — ABNORMAL LOW (ref 3.87–5.11)
RDW: 15.3 % (ref 11.5–15.5)
WBC Count: 12.7 10*3/uL — ABNORMAL HIGH (ref 4.0–10.5)
nRBC: 0 % (ref 0.0–0.2)

## 2022-03-26 LAB — FERRITIN: Ferritin: 64 ng/mL (ref 11–307)

## 2022-03-26 LAB — CMP (CANCER CENTER ONLY)
ALT: 9 U/L (ref 0–44)
AST: 16 U/L (ref 15–41)
Albumin: 3 g/dL — ABNORMAL LOW (ref 3.5–5.0)
Alkaline Phosphatase: 62 U/L (ref 38–126)
Anion gap: 11 (ref 5–15)
BUN: 56 mg/dL — ABNORMAL HIGH (ref 8–23)
CO2: 23 mmol/L (ref 22–32)
Calcium: 8.9 mg/dL (ref 8.9–10.3)
Chloride: 106 mmol/L (ref 98–111)
Creatinine: 2.77 mg/dL — ABNORMAL HIGH (ref 0.44–1.00)
GFR, Estimated: 15 mL/min — ABNORMAL LOW (ref >60)
Glucose, Bld: 109 mg/dL — ABNORMAL HIGH (ref 70–99)
Potassium: 4.1 mmol/L (ref 3.5–5.1)
Sodium: 140 mmol/L (ref 135–145)
Total Bilirubin: 0.3 mg/dL (ref 0.3–1.2)
Total Protein: 6.1 g/dL — ABNORMAL LOW (ref 6.5–8.1)

## 2022-03-26 LAB — LACTATE DEHYDROGENASE: LDH: 241 U/L — ABNORMAL HIGH (ref 98–192)

## 2022-03-26 LAB — RETICULOCYTES
Immature Retic Fract: 16.8 % — ABNORMAL HIGH (ref 2.3–15.9)
RBC.: 2.45 MIL/uL — ABNORMAL LOW (ref 3.87–5.11)
Retic Count, Absolute: 61.7 10*3/uL (ref 19.0–186.0)
Retic Ct Pct: 2.5 % (ref 0.4–3.1)

## 2022-03-26 LAB — PREPARE RBC (CROSSMATCH)

## 2022-03-26 MED ORDER — EPOETIN ALFA-EPBX 40000 UNIT/ML IJ SOLN
40000.0000 [IU] | Freq: Once | INTRAMUSCULAR | Status: AC
  Administered 2022-03-26: 40000 [IU] via SUBCUTANEOUS
  Filled 2022-03-26: qty 1

## 2022-03-26 NOTE — Progress Notes (Signed)
Hematology and Oncology Follow Up Visit  Joy Lynch [id number] Feb 28, 1926 88 y.o. 03/26/2022   Principle Diagnosis:  Marginal Zone lymphoma -- relapsed Anemia secondary to chronic kidney disease, stage III   Past Therapy: Umbralisib 400 mg po q day, started on 10/22/2020 - discontinued by manufacturer    Current Therapy:        Calquence 100 mg PO daily, started 05/25/2021 Retacrit SQ as indicated for Hgb < 11    Interim History:  Joy Lynch is here today with her nephew for follow-up. She is symptomatic with fatigue, weakness and SOB with any exertion.  No blood loss noted. No bruising or petechiae.  Hgb is down to 8.0 from 10. It has been 2 months since her last ESA injection.  No fever, chills, n/v, cough, rash, dizziness, chest pain, palpitations, abdominal pain or changes in bowel or bladder habits.  No numbness or tingling in her extremities at this time.  No falls or syncope reported.  She notes occasional puffiness in her ankles. This comes and goes. None noted at this time.  Appetite comes and goes and she is doing her best to stay well hydrated. Her weight is 123 lbs (previously 131 lbs).   ECOG Performance Status: 2 - Symptomatic, <50% confined to bed  Medications:  Allergies as of 03/26/2022       Reactions   Penicillins Hives   Tegretol [carbamazepine] Hives, Other (See Comments)   Turned purple from neck down and drowsy   Lyrica [pregabalin] Other (See Comments)   Drowsy        Medication List        Accurate as of March 26, 2022  1:52 PM. If you have any questions, ask your nurse or doctor.          acetaminophen 500 MG tablet Commonly known as: TYLENOL Take 500 mg by mouth every 6 (six) hours as needed.   acetaminophen 500 MG tablet Commonly known as: TYLENOL Take 500 mg by mouth 3 (three) times daily.   aspirin 81 MG chewable tablet Chew 81 mg by mouth daily.   Calquence 100 MG Tabs Generic drug: acalabrutinib maleate Take 100 mg by mouth  daily.   famciclovir 125 MG tablet Commonly known as: FAMVIR Take 250 mg by mouth daily. Two tablets   gabapentin 600 MG tablet Commonly known as: NEURONTIN Take 600 mg by mouth 3 (three) times daily.   lactose free nutrition Liqd Take 237 mLs by mouth daily.   levothyroxine 100 MCG tablet Commonly known as: SYNTHROID Take 112 mcg by mouth daily before breakfast.   Multi-Vitamins Tabs Take 1 tablet by mouth daily.   pantoprazole 40 MG tablet Commonly known as: PROTONIX Take 40 mg by mouth 2 (two) times daily.   polyvinyl alcohol 1.4 % ophthalmic solution Commonly known as: LIQUIFILM TEARS Place 1 drop into both eyes 4 (four) times daily as needed.   Pro-Stat Liqd Take 30 mLs by mouth daily.   simethicone 125 MG chewable tablet Commonly known as: MYLICON Chew 125 mg by mouth 2 (two) times daily as needed for flatulence.   torsemide 10 MG tablet Commonly known as: DEMADEX 10 mg daily as needed.   vitamin B-12 1000 MCG tablet Commonly known as: CYANOCOBALAMIN Take 1 tablet (1,000 mcg total) by mouth daily.   white petrolatum ointment Apply topically 2 (two) times daily.   zinc oxide 20 % ointment Apply 1 application. topically as needed for irritation. To buttocks after every incontinent episode and as  needed for redness. May keep at bedside.        Allergies:  Allergies  Allergen Reactions   Penicillins Hives   Tegretol [Carbamazepine] Hives and Other (See Comments)    Turned purple from neck down and drowsy    Lyrica [Pregabalin] Other (See Comments)    Drowsy    Past Medical History, Surgical history, Social history, and Family History were reviewed and updated.  Review of Systems: All other 10 point review of systems is negative.   Physical Exam:  vitals were not taken for this visit.   Wt Readings from Last 3 Encounters:  03/20/22 131 lb 6.4 oz (59.6 kg)  03/08/22 131 lb 6.4 oz (59.6 kg)  03/06/22 131 lb 6.4 oz (59.6 kg)    Ocular:  Sclerae unicteric, pupils equal, round and reactive to light Ear-nose-throat: Oropharynx clear, dentition fair Lymphatic: No cervical or supraclavicular adenopathy Lungs no rales or rhonchi, good excursion bilaterally Heart regular rate and rhythm, no murmur appreciated Abd soft, nontender, positive bowel sounds MSK no focal spinal tenderness, no joint edema Neuro: non-focal, well-oriented, appropriate affect Breasts: Deferred   Lab Results  Component Value Date   WBC 14.3 (H) 01/24/2022   HGB 10.0 (L) 01/24/2022   HCT 31.0 (L) 01/24/2022   MCV 103.3 (H) 01/24/2022   PLT 157 01/24/2022   Lab Results  Component Value Date   FERRITIN 40 01/24/2022   IRON 54 01/24/2022   TIBC 267 01/24/2022   UIBC 213 01/24/2022   IRONPCTSAT 20 01/24/2022   Lab Results  Component Value Date   RETICCTPCT 2.3 01/24/2022   RBC 2.97 (L) 01/24/2022   Lab Results  Component Value Date   KPAFRELGTCHN 20.9 (H) 11/16/2021   LAMBDASER 19.0 11/16/2021   KAPLAMBRATIO 1.10 11/16/2021   Lab Results  Component Value Date   IGGSERUM 168 (L) 08/21/2021   IGA 221 08/21/2021   IGMSERUM 1,672 (H) 08/21/2021   No results found for: "TOTALPROTELP", "ALBUMINELP", "A1GS", "A2GS", "BETS", "BETA2SER", "GAMS", "MSPIKE", "SPEI"   Chemistry      Component Value Date/Time   NA 143 01/24/2022 1340   NA 137 11/10/2020 0000   NA 138 08/16/2017 1256   K 4.9 01/24/2022 1340   K 4.3 08/16/2017 1256   CL 110 01/24/2022 1340   CO2 25 01/24/2022 1340   CO2 25 08/16/2017 1256   BUN 45 (H) 01/24/2022 1340   BUN 16 11/10/2020 0000   BUN 19.6 08/16/2017 1256   CREATININE 2.14 (H) 01/24/2022 1340   CREATININE 0.7 08/16/2017 1256   GLU 78 11/10/2020 0000      Component Value Date/Time   CALCIUM 8.9 01/24/2022 1340   CALCIUM 9.2 08/16/2017 1256   ALKPHOS 46 01/24/2022 1340   ALKPHOS 42 08/16/2017 1256   AST 15 01/24/2022 1340   AST 18 08/16/2017 1256   ALT 8 01/24/2022 1340   ALT 11 08/16/2017 1256   BILITOT  0.3 01/24/2022 1340   BILITOT 0.87 08/16/2017 1256       Impression and Plan: Joy Lynch is a very pleasant 88 yo caucasian female with relapsed marginal zone lymphoma and multifactorial anemia.  She received her ESA injection today for Hgb 8.0.  We will give her 2 units of blood on Thursday per her preference.  Follow-up in 1 month.   Eileen Stanford, NP 6/26/20231:52 PM

## 2022-03-27 LAB — IRON AND IRON BINDING CAPACITY (CC-WL,HP ONLY)
Iron: 31 ug/dL (ref 28–170)
Saturation Ratios: 13 % (ref 10.4–31.8)
TIBC: 241 ug/dL — ABNORMAL LOW (ref 250–450)
UIBC: 210 ug/dL (ref 148–442)

## 2022-03-29 ENCOUNTER — Telehealth: Payer: Self-pay | Admitting: *Deleted

## 2022-03-29 ENCOUNTER — Inpatient Hospital Stay: Payer: Medicare Other

## 2022-03-29 DIAGNOSIS — D630 Anemia in neoplastic disease: Secondary | ICD-10-CM

## 2022-03-29 DIAGNOSIS — C8300 Small cell B-cell lymphoma, unspecified site: Secondary | ICD-10-CM | POA: Diagnosis not present

## 2022-03-29 DIAGNOSIS — D649 Anemia, unspecified: Secondary | ICD-10-CM

## 2022-03-29 DIAGNOSIS — D509 Iron deficiency anemia, unspecified: Secondary | ICD-10-CM

## 2022-03-29 MED ORDER — DIPHENHYDRAMINE HCL 25 MG PO CAPS
25.0000 mg | ORAL_CAPSULE | Freq: Once | ORAL | Status: AC
  Administered 2022-03-29: 25 mg via ORAL
  Filled 2022-03-29: qty 1

## 2022-03-29 MED ORDER — ACETAMINOPHEN 325 MG PO TABS
650.0000 mg | ORAL_TABLET | Freq: Once | ORAL | Status: AC
  Administered 2022-03-29: 650 mg via ORAL
  Filled 2022-03-29: qty 2

## 2022-03-29 MED ORDER — SODIUM CHLORIDE 0.9% IV SOLUTION
250.0000 mL | Freq: Once | INTRAVENOUS | Status: AC
  Administered 2022-03-29: 250 mL via INTRAVENOUS

## 2022-03-29 NOTE — Patient Instructions (Signed)
Blood Transfusion, Adult A blood transfusion is a procedure in which you receive blood through an IV tube. You may need this procedure because of: A bleeding disorder. An illness. An injury. A surgery. The blood may come from someone else (a donor). You may also be able to donate blood for yourself. The blood given in a transfusion is made up of different types of cells. You may get: Red blood cells. These carry oxygen to the cells in the body. White blood cells. These help you fight infections. Platelets. These help your blood to clot. Plasma. This is the liquid part of your blood. It carries proteins and other substances through the body. If you have a clotting disorder, you may also get other types of blood products. Tell your doctor about: Any blood disorders you have. Any reactions you have had during a blood transfusion in the past. Any allergies you have. All medicines you are taking, including vitamins, herbs, eye drops, creams, and over-the-counter medicines. Any surgeries you have had. Any medical conditions you have. This includes any recent fever or cold symptoms. Whether you are pregnant or may be pregnant. What are the risks? Generally, this is a safe procedure. However, problems may occur. The most common problems include: A mild allergic reaction. This includes red, swollen areas of skin (hives) and itching. Fever or chills. This may be the body's response to new blood cells received. This may happen during or up to 4 hours after the transfusion. More serious problems may include: Too much fluid in the lungs. This may cause breathing problems. A serious allergic reaction. This includes breathing trouble or swelling around the face and lips. Lung injury. This causes breathing trouble and low oxygen in the blood. This can happen within hours of the transfusion or days later. Too much iron. This can happen after getting many blood transfusions over a period of time. An  infection or virus passed through the blood. This is rare. Donated blood is carefully tested before it is given. Your body's defense system (immune system) trying to attack the new blood cells. This is rare. Symptoms may include fever, chills, nausea, low blood pressure, and low back or chest pain. Donated cells attacking healthy tissues. This is rare. What happens before the procedure? Medicines Ask your doctor about: Changing or stopping your normal medicines. This is important. Taking aspirin and ibuprofen. Do not take these medicines unless your doctor tells you to take them. Taking over-the-counter medicines, vitamins, herbs, and supplements. General instructions Follow instructions from your doctor about what you cannot eat or drink. You will have a blood test to find out your blood type. The test also finds out what type of blood your body will accept and matches it to the donor type. If you are going to have a planned surgery, you may be able to donate your own blood. This may be done in case you need a transfusion. You will have your temperature, blood pressure, and pulse checked. You may receive medicine to help prevent an allergic reaction. This may be done if you have had a reaction to a transfusion before. This medicine may be given to you by mouth or through an IV tube. This procedure lasts about 1-4 hours. Plan for the time you need. What happens during the procedure?  An IV tube will be put into one of your veins. The bag of donated blood will be attached to your IV tube. Then, the blood will enter through your vein. Your temperature,   blood pressure, and pulse will be checked often. This is done to find early signs of a transfusion reaction. Tell your nurse right away if you have any of these symptoms: Shortness of breath or trouble breathing. Chest or back pain. Fever or chills. Red, swollen areas of skin or itching. If you have any signs or symptoms of a reaction, your  transfusion will be stopped. You may also be given medicine. When the transfusion is finished, your IV tube will be taken out. Pressure may be put on the IV site for a few minutes. A bandage (dressing) will be put on the IV site. The procedure may vary among doctors and hospitals. What happens after the procedure? You will be monitored until you leave the hospital or clinic. This includes checking your temperature, blood pressure, pulse, breathing rate, and blood oxygen level. Your blood may be tested to see how you are responding to the transfusion. You may be warmed with fluids or blankets. This is done to keep the temperature of your body normal. If you have your procedure in an outpatient setting, you will be told whom to contact to report any reactions. Where to find more information To learn more, visit the [employer]: redcross.org Summary A blood transfusion is a procedure in which you are given blood through an IV tube. The blood may come from someone else (a donor). You may also be able to donate blood for yourself. The blood you are given is made up of different blood cells. You may receive red blood cells, platelets, plasma, or white blood cells. Your temperature, blood pressure, and pulse will be checked often. After the procedure, your blood may be tested to see how you are responding. This information is not intended to replace advice given to you by your health care provider. Make sure you discuss any questions you have with your health care provider. Document Revised: 03/12/2019 Document Reviewed: 03/12/2019 Elsevier Patient Education  2023 [employer] Inc[employer]  

## 2022-03-29 NOTE — Telephone Encounter (Signed)
Per 03/26/22 los - gave upcoming appointments - confirmed - print calendar

## 2022-03-30 LAB — TYPE AND SCREEN
ABO/RH(D): B POS
Antibody Screen: NEGATIVE
Unit division: 0
Unit division: 0

## 2022-03-30 LAB — BPAM RBC
Blood Product Expiration Date: 202307062359
Blood Product Expiration Date: 202307072359
ISSUE DATE / TIME: 202306290801
ISSUE DATE / TIME: 202306290801
Unit Type and Rh: 1700
Unit Type and Rh: 1700

## 2022-04-25 ENCOUNTER — Inpatient Hospital Stay: Payer: Medicare Other | Attending: Hematology & Oncology

## 2022-04-25 ENCOUNTER — Inpatient Hospital Stay: Payer: Medicare Other

## 2022-04-25 ENCOUNTER — Encounter: Payer: Self-pay | Admitting: Family

## 2022-04-25 ENCOUNTER — Inpatient Hospital Stay (HOSPITAL_BASED_OUTPATIENT_CLINIC_OR_DEPARTMENT_OTHER): Payer: Medicare Other | Admitting: Family

## 2022-04-25 VITALS — BP 86/54 | HR 111 | Temp 97.6°F | Resp 18

## 2022-04-25 DIAGNOSIS — D649 Anemia, unspecified: Secondary | ICD-10-CM

## 2022-04-25 DIAGNOSIS — D631 Anemia in chronic kidney disease: Secondary | ICD-10-CM | POA: Insufficient documentation

## 2022-04-25 DIAGNOSIS — D509 Iron deficiency anemia, unspecified: Secondary | ICD-10-CM

## 2022-04-25 DIAGNOSIS — C8330 Diffuse large B-cell lymphoma, unspecified site: Secondary | ICD-10-CM | POA: Diagnosis present

## 2022-04-25 DIAGNOSIS — C8588 Other specified types of non-Hodgkin lymphoma, lymph nodes of multiple sites: Secondary | ICD-10-CM | POA: Diagnosis not present

## 2022-04-25 DIAGNOSIS — N183 Chronic kidney disease, stage 3 unspecified: Secondary | ICD-10-CM | POA: Diagnosis not present

## 2022-04-25 DIAGNOSIS — C8300 Small cell B-cell lymphoma, unspecified site: Secondary | ICD-10-CM | POA: Diagnosis not present

## 2022-04-25 DIAGNOSIS — D630 Anemia in neoplastic disease: Secondary | ICD-10-CM

## 2022-04-25 LAB — CMP (CANCER CENTER ONLY)
ALT: 9 U/L (ref 0–44)
AST: 16 U/L (ref 15–41)
Albumin: 3 g/dL — ABNORMAL LOW (ref 3.5–5.0)
Alkaline Phosphatase: 51 U/L (ref 38–126)
Anion gap: 8 (ref 5–15)
BUN: 51 mg/dL — ABNORMAL HIGH (ref 8–23)
CO2: 24 mmol/L (ref 22–32)
Calcium: 8.9 mg/dL (ref 8.9–10.3)
Chloride: 108 mmol/L (ref 98–111)
Creatinine: 2.72 mg/dL — ABNORMAL HIGH (ref 0.44–1.00)
GFR, Estimated: 16 mL/min — ABNORMAL LOW (ref >60)
Glucose, Bld: 101 mg/dL — ABNORMAL HIGH (ref 70–99)
Potassium: 5.1 mmol/L (ref 3.5–5.1)
Sodium: 140 mmol/L (ref 135–145)
Total Bilirubin: 0.4 mg/dL (ref 0.3–1.2)
Total Protein: 5.6 g/dL — ABNORMAL LOW (ref 6.5–8.1)

## 2022-04-25 LAB — CBC WITH DIFFERENTIAL (CANCER CENTER ONLY)
Abs Immature Granulocytes: 0.07 10*3/uL (ref 0.00–0.07)
Basophils Absolute: 0 10*3/uL (ref 0.0–0.1)
Basophils Relative: 0 %
Eosinophils Absolute: 0.1 10*3/uL (ref 0.0–0.5)
Eosinophils Relative: 1 %
HCT: 34.6 % — ABNORMAL LOW (ref 36.0–46.0)
Hemoglobin: 11.2 g/dL — ABNORMAL LOW (ref 12.0–15.0)
Immature Granulocytes: 1 %
Lymphocytes Relative: 35 %
Lymphs Abs: 4.4 10*3/uL — ABNORMAL HIGH (ref 0.7–4.0)
MCH: 31.9 pg (ref 26.0–34.0)
MCHC: 32.4 g/dL (ref 30.0–36.0)
MCV: 98.6 fL (ref 80.0–100.0)
Monocytes Absolute: 0.7 10*3/uL (ref 0.1–1.0)
Monocytes Relative: 6 %
Neutro Abs: 7.4 10*3/uL (ref 1.7–7.7)
Neutrophils Relative %: 57 %
Platelet Count: 151 10*3/uL (ref 150–400)
RBC: 3.51 MIL/uL — ABNORMAL LOW (ref 3.87–5.11)
RDW: 17.9 % — ABNORMAL HIGH (ref 11.5–15.5)
WBC Count: 12.8 10*3/uL — ABNORMAL HIGH (ref 4.0–10.5)
nRBC: 0 % (ref 0.0–0.2)

## 2022-04-25 LAB — RETICULOCYTES
Immature Retic Fract: 9 % (ref 2.3–15.9)
RBC.: 3.52 MIL/uL — ABNORMAL LOW (ref 3.87–5.11)
Retic Count, Absolute: 40.1 10*3/uL (ref 19.0–186.0)
Retic Ct Pct: 1.1 % (ref 0.4–3.1)

## 2022-04-25 LAB — SAMPLE TO BLOOD BANK

## 2022-04-25 LAB — FERRITIN: Ferritin: 80 ng/mL (ref 11–307)

## 2022-04-25 LAB — LACTATE DEHYDROGENASE: LDH: 222 U/L — ABNORMAL HIGH (ref 98–192)

## 2022-04-25 NOTE — Progress Notes (Signed)
Hematology and Oncology Follow Up Visit  Joy Lynch [id number] [birth year] 88 y.o. 04/25/2022   Principle Diagnosis:  Marginal Zone lymphoma -- relapsed Anemia secondary to chronic kidney disease, stage III   Past Therapy: Umbralisib 400 mg po q day, started on 10/22/2020 - discontinued by manufacturer    Current Therapy:        Calquence 100 mg PO daily, started 05/25/2021 Retacrit SQ as indicated for Hgb < 11    Interim History:  Joy Lynch is here today with her nephew for follow-up. She is feeling much better since receiving blood after her last visit. Energy has improved. Hgb is now up to 11.2 and holding.  She has not noted any obvious blood loss. No bruising or petechiae.  No fever, chills, n/v, cough, rash, dizziness, chest pain, palpitations, abdominal pain or changes in bladder habits.  She has been having several loose stool daily for the last 3 weeks. She will try taking imodium. No antibiotic therapy.  No swelling, tenderness, numbness or tingling in her extremities.  No falls or syncope to report.  Appetite and hydration have been good.   ECOG Performance Status: 2 - Symptomatic, <50% confined to bed  Medications:  Allergies as of 04/25/2022       Reactions   Penicillins Hives   Tegretol [carbamazepine] Hives, Other (See Comments)   Turned purple from neck down and drowsy   Lyrica [pregabalin] Other (See Comments)   Drowsy        Medication List        Accurate as of April 25, 2022  3:50 PM. If you have any questions, ask your nurse or doctor.          acetaminophen 500 MG tablet Commonly known as: TYLENOL Take 500 mg by mouth every 6 (six) hours as needed.   acetaminophen 500 MG tablet Commonly known as: TYLENOL Take 500 mg by mouth 3 (three) times daily.   aspirin 81 MG chewable tablet Chew 81 mg by mouth daily.   Calquence 100 MG Tabs Generic drug: acalabrutinib maleate Take 100 mg by mouth daily.   cyanocobalamin 1000 MCG  tablet Commonly known as: VITAMIN B12 Take 1 tablet (1,000 mcg total) by mouth daily.   famciclovir 125 MG tablet Commonly known as: FAMVIR Take 250 mg by mouth daily. Two tablets   gabapentin 600 MG tablet Commonly known as: NEURONTIN Take 600 mg by mouth 3 (three) times daily.   lactose free nutrition Liqd Take 237 mLs by mouth daily.   levothyroxine 100 MCG tablet Commonly known as: SYNTHROID Take 112 mcg by mouth daily before breakfast.   Multi-Vitamins Tabs Take 1 tablet by mouth daily.   pantoprazole 40 MG tablet Commonly known as: PROTONIX Take 40 mg by mouth 2 (two) times daily.   polyvinyl alcohol 1.4 % ophthalmic solution Commonly known as: LIQUIFILM TEARS Place 1 drop into both eyes 4 (four) times daily as needed.   Pro-Stat Liqd Take 30 mLs by mouth daily.   simethicone 125 MG chewable tablet Commonly known as: MYLICON Chew 970 mg by mouth 2 (two) times daily as needed for flatulence.   torsemide 10 MG tablet Commonly known as: DEMADEX 10 mg daily as needed.   white petrolatum ointment Apply topically 2 (two) times daily.   zinc oxide 20 % ointment Apply 1 application. topically as needed for irritation. To buttocks after every incontinent episode and as needed for redness. May keep at bedside.        Allergies:  Allergies  Allergen Reactions   Penicillins Hives   Tegretol [Carbamazepine] Hives and Other (See Comments)    Turned purple from neck down and drowsy    Lyrica [Pregabalin] Other (See Comments)    Drowsy    Past Medical History, Surgical history, Social history, and Family History were reviewed and updated.  Review of Systems: All other 10 point review of systems is negative.   Physical Exam:  oral temperature is 97.6 F (36.4 C). Her blood pressure is 86/54 (abnormal) and her pulse is 111 (abnormal). Her respiration is 18 and oxygen saturation is 94%.   Wt Readings from Last 3 Encounters:  03/26/22 123 lb 1.6 oz (55.8 kg)   03/20/22 131 lb 6.4 oz (59.6 kg)  03/08/22 131 lb 6.4 oz (59.6 kg)    Ocular: Sclerae unicteric, pupils equal, round and reactive to light Ear-nose-throat: Oropharynx clear, dentition fair Lymphatic: No cervical or supraclavicular adenopathy Lungs no rales or rhonchi, good excursion bilaterally Heart regular rate and rhythm, no murmur appreciated Abd soft, nontender, positive bowel sounds MSK no focal spinal tenderness, no joint edema Neuro: non-focal, well-oriented, appropriate affect Breasts: Deferred   Lab Results  Component Value Date   WBC 12.8 (H) 04/25/2022   HGB 11.2 (L) 04/25/2022   HCT 34.6 (L) 04/25/2022   MCV 98.6 04/25/2022   PLT 151 04/25/2022   Lab Results  Component Value Date   FERRITIN 64 03/26/2022   IRON 31 03/26/2022   TIBC 241 (L) 03/26/2022   UIBC 210 03/26/2022   IRONPCTSAT 13 03/26/2022   Lab Results  Component Value Date   RETICCTPCT 1.1 04/25/2022   RBC 3.51 (L) 04/25/2022   RBC 3.52 (L) 04/25/2022   Lab Results  Component Value Date   KPAFRELGTCHN 20.9 (H) 11/16/2021   LAMBDASER 19.0 11/16/2021   KAPLAMBRATIO 1.10 11/16/2021   Lab Results  Component Value Date   IGGSERUM 168 (L) 08/21/2021   IGA 221 08/21/2021   IGMSERUM 1,672 (H) 08/21/2021   No results found for: "TOTALPROTELP", "ALBUMINELP", "A1GS", "A2GS", "BETS", "BETA2SER", "GAMS", "MSPIKE", "SPEI"   Chemistry      Component Value Date/Time   NA 140 04/25/2022 1420   NA 137 11/10/2020 0000   NA 138 08/16/2017 1256   K 5.1 04/25/2022 1420   K 4.3 08/16/2017 1256   CL 108 04/25/2022 1420   CO2 24 04/25/2022 1420   CO2 25 08/16/2017 1256   BUN 51 (H) 04/25/2022 1420   BUN 16 11/10/2020 0000   BUN 19.6 08/16/2017 1256   CREATININE 2.72 (H) 04/25/2022 1420   CREATININE 0.7 08/16/2017 1256   GLU 78 11/10/2020 0000      Component Value Date/Time   CALCIUM 8.9 04/25/2022 1420   CALCIUM 9.2 08/16/2017 1256   ALKPHOS 51 04/25/2022 1420   ALKPHOS 42 08/16/2017 1256   AST  16 04/25/2022 1420   AST 18 08/16/2017 1256   ALT 9 04/25/2022 1420   ALT 11 08/16/2017 1256   BILITOT 0.4 04/25/2022 1420   BILITOT 0.87 08/16/2017 1256       Impression and Plan: Joy Lynch is a very pleasant 88 yo caucasian female with relapsed marginal zone lymphoma and multifactorial anemia.  No ESA needed this visit, Hgb 11.2.  Follow-up in 1 month.   Lottie Dawson, NP 7/26/20233:50 PM

## 2022-04-26 LAB — IRON AND IRON BINDING CAPACITY (CC-WL,HP ONLY)
Iron: 78 ug/dL (ref 28–170)
Saturation Ratios: 38 % — ABNORMAL HIGH (ref 10.4–31.8)
TIBC: 206 ug/dL — ABNORMAL LOW (ref 250–450)
UIBC: 128 ug/dL — ABNORMAL LOW (ref 148–442)

## 2022-05-09 ENCOUNTER — Encounter: Payer: Self-pay | Admitting: Orthopedic Surgery

## 2022-05-09 ENCOUNTER — Non-Acute Institutional Stay: Payer: Medicare Other | Admitting: Orthopedic Surgery

## 2022-05-09 DIAGNOSIS — R600 Localized edema: Secondary | ICD-10-CM | POA: Diagnosis not present

## 2022-05-09 DIAGNOSIS — E039 Hypothyroidism, unspecified: Secondary | ICD-10-CM

## 2022-05-09 DIAGNOSIS — L989 Disorder of the skin and subcutaneous tissue, unspecified: Secondary | ICD-10-CM

## 2022-05-09 DIAGNOSIS — N184 Chronic kidney disease, stage 4 (severe): Secondary | ICD-10-CM

## 2022-05-09 DIAGNOSIS — G500 Trigeminal neuralgia: Secondary | ICD-10-CM

## 2022-05-09 DIAGNOSIS — D631 Anemia in chronic kidney disease: Secondary | ICD-10-CM

## 2022-05-09 DIAGNOSIS — C8588 Other specified types of non-Hodgkin lymphoma, lymph nodes of multiple sites: Secondary | ICD-10-CM | POA: Diagnosis not present

## 2022-05-09 NOTE — Progress Notes (Signed)
Location:   [facility] Room Number: 9 Place of Service:  ALF [phone]) Provider: Yvonna Alanis, NP   Virgie Dad, MD  Patient Care Team: Virgie Dad, MD as PCP - General (Internal Medicine)  Extended Emergency Contact Information Primary Emergency Contact: Genia Plants Address: [address]          [place], [place] [address] Johnnette Litter of [place] Phone: [phone] Relation: Alanson Puls Secondary Emergency Contact: [name] Address: [address]          [place], [place] [address] Johnnette Litter of [place] Phone: [phone] Mobile Phone: [phone] Relation: Nephew  Code Status:  Full Code Managed Care Goals of care: Advanced Directive information    05/09/2022   10:30 AM  Advanced Directives  Does Patient Have a Medical Advance Directive? Yes  Type of Paramedic of [place];Living will  Does patient want to make changes to medical advance directive? No - Patient declined  Copy of [place] in Chart? Yes - validated most recent copy scanned in chart (See row information)     Chief Complaint  Patient presents with   Medication Management   Quality Metric Gaps    Verified [facility] and matrix patient is due for shingrix and flu shot    HPI:  Pt is a 88 y.o. female seen today for medical management of chronic diseases.    She currently resides in assisted living at [facility]. PMH: hypertension, trigeminal neuralgia, migraines, hypothyroidism, cerebellar infarct, trigeminal neuralgia, lymphoma, CKD stage III, anemia of neoplastic disease, osteoporosis, and gait abnormality.  Lymphoma- followed by oncology, marginal zone lymphoma- relapsed, WBC 12.8 (04/25/2022), 07/03 received 2 units PRBC for hgb 8.0 (06/26)> hgb 11.2 (07/26), remains on Calquence, Famciclovir, and Retacrit SQ for hgb < 11 Hypothyroidism- TSH 0.07 (06/19)> was 10.52 (04/07), remains on levothyroxine BLE- remains on  torsemide prn CKD- BUN/creat 51/2.72 04/25/2022 GERD- hgb 11.2 04/25/2022, remains on Protonix Anemia- see above Trigeminal neuralgia- remains on gabapentin Osteoporosis- DEXA 2021, not on supplement at this time Skin lesion- small lesion to nose, reports h/o basal cell carcinoma in past, requesting dermatology referral  No recent falls or injuries. Ambulates with walker.   Recent blood pressures:  08/09- 124/76  08/08- 104/66  08/07- 120/78  Recent weights:  08/03- 127 lbs  07/05- 125.4 lbs  06/03- 131.4 lbs  Past Medical History:  Diagnosis Date   Cancer ([place])    non hodgkin lymphoma   Chronic lymphocytic leukemia (HCC)    History of B-cell lymphoma 11/02/2015   Migraines    Osteoporosis    Thyroid disease    Trigeminal neuralgia of left side of face    Past Surgical History:  Procedure Laterality Date   gamma knife     for trigeminal neuralgia   SHOULDER SURGERY     Right shoulder replacement   TONSILLECTOMY AND ADENOIDECTOMY     WISDOM TOOTH EXTRACTION      Allergies  Allergen Reactions   Penicillins Hives   Tegretol [Carbamazepine] Hives and Other (See Comments)    Turned purple from neck down and drowsy    Lyrica [Pregabalin] Other (See Comments)    Drowsy    Allergies as of 05/09/2022       Reactions   Penicillins Hives   Tegretol [carbamazepine] Hives, Other (See Comments)   Turned purple from neck down and drowsy   Lyrica [pregabalin] Other (See Comments)   Drowsy  Medication List        Accurate as of May 09, 2022 10:31 AM. If you have any questions, ask your nurse or doctor.          acetaminophen 500 MG tablet Commonly known as: TYLENOL Take 500 mg by mouth every 6 (six) hours as needed.   acetaminophen 500 MG tablet Commonly known as: TYLENOL Take 500 mg by mouth 3 (three) times daily.   aspirin 81 MG chewable tablet Chew 81 mg by mouth daily.   Calquence 100 MG Tabs Generic drug: acalabrutinib maleate Take 100 mg  by mouth daily.   cyanocobalamin 1000 MCG tablet Commonly known as: VITAMIN B12 Take 1 tablet (1,000 mcg total) by mouth daily.   famciclovir 125 MG tablet Commonly known as: FAMVIR Take 250 mg by mouth daily. Two tablets   gabapentin 600 MG tablet Commonly known as: NEURONTIN Take 600 mg by mouth 3 (three) times daily.   lactose free nutrition Liqd Take 237 mLs by mouth daily.   levothyroxine 100 MCG tablet Commonly known as: SYNTHROID Take 112 mcg by mouth daily before breakfast.   Multi-Vitamins Tabs Take 1 tablet by mouth daily.   pantoprazole 40 MG tablet Commonly known as: PROTONIX Take 40 mg by mouth 2 (two) times daily.   polyvinyl alcohol 1.4 % ophthalmic solution Commonly known as: LIQUIFILM TEARS Place 1 drop into both eyes 4 (four) times daily as needed.   Pro-Stat Liqd Take 30 mLs by mouth daily.   simethicone 125 MG chewable tablet Commonly known as: MYLICON Chew 132 mg by mouth 2 (two) times daily as needed for flatulence.   torsemide 10 MG tablet Commonly known as: DEMADEX 10 mg daily as needed.   traMADol 50 MG tablet Commonly known as: ULTRAM Take 25 mg by mouth 3 (three) times daily.   white petrolatum ointment Apply topically 2 (two) times daily.   zinc oxide 20 % ointment Apply 1 application. topically as needed for irritation. To buttocks after every incontinent episode and as needed for redness. May keep at bedside.        Review of Systems  Constitutional:  Positive for fatigue. Negative for activity change, appetite change, chills and fever.  HENT:  Negative for congestion and trouble swallowing.   Eyes:  Negative for visual disturbance.  Respiratory:  Negative for cough, shortness of breath and wheezing.   Cardiovascular:  Negative for chest pain and leg swelling.  Gastrointestinal:  Negative for abdominal distention, abdominal pain, constipation, diarrhea, nausea and vomiting.  Genitourinary:  Negative for dysuria, frequency  and hematuria.  Musculoskeletal:  Positive for gait problem.  Skin:  Positive for color change. Negative for wound.  Neurological:  Positive for weakness. Negative for dizziness and light-headedness.  Psychiatric/Behavioral:  Negative for confusion, dysphoric mood and sleep disturbance. The patient is not nervous/anxious.     Immunization History  Administered Date(s) Administered   Fluad Quad(high Dose 65+) 05/28/2019   Influenza Split 07/12/2016, 08/13/2017, 06/02/2019, 09/09/2020   Influenza, High Dose Seasonal PF 06/11/2017, 06/28/2018   Influenza-Unspecified 06/21/2020, 07/25/2021   Moderna SARS-COV2 Booster Vaccination 03/08/2021   Moderna Sars-Covid-2 Vaccination 10/05/2019, 11/02/2019, 08/15/2020   PFIZER(Purple Top)SARS-COV-2 Vaccination 06/21/2021   Pneumococcal Conjugate-13 11/22/2014   Pneumococcal Polysaccharide-23 04/29/2009, 08/13/2017   Td 01/24/2013   Tdap 12/28/2017, 07/01/2018, 12/13/2020   Zoster Recombinat (Shingrix) 07/01/2018   Zoster, Live 11/12/2009, 07/01/2018   Zoster, Unspecified 12/17/2005   Pertinent  Health Maintenance Due  Topic Date Due   INFLUENZA VACCINE  05/01/2022   DEXA SCAN  Completed      11/16/2021   10:51 AM 12/19/2021    2:02 PM 01/24/2022    2:25 PM 03/26/2022    2:00 PM 04/25/2022    2:48 PM  Fall Risk  Patient Fall Risk Level High fall risk High fall risk High fall risk High fall risk High fall risk   Functional Status Survey:    Vitals:   05/09/22 1012  BP: 124/76  Pulse: (!) 103  Resp: 18  Temp: 97.9 F (36.6 C)  SpO2: 93%  Weight: 127 lb (57.6 kg)  Height: '5\' 6"'$  (1.676 m)   Body mass index is 20.5 kg/m. Physical Exam Vitals reviewed.  Constitutional:      Comments: Pale complexion  HENT:     Head: Normocephalic.     Right Ear: There is no impacted cerumen.     Left Ear: There is no impacted cerumen.     Nose: Nose normal.     Mouth/Throat:     Mouth: Mucous membranes are moist.  Eyes:     General:         Right eye: No discharge.        Left eye: No discharge.  Cardiovascular:     Rate and Rhythm: Normal rate and regular rhythm.     Pulses: Normal pulses.     Heart sounds: Normal heart sounds.  Pulmonary:     Effort: Pulmonary effort is normal. No respiratory distress.     Breath sounds: Normal breath sounds. No wheezing.  Abdominal:     General: Bowel sounds are normal. There is no distension.     Palpations: Abdomen is soft.     Tenderness: There is no abdominal tenderness.  Musculoskeletal:     Cervical back: Neck supple.     Right lower leg: No edema.     Left lower leg: No edema.  Skin:    General: Skin is warm and dry.     Capillary Refill: Capillary refill takes less than 2 seconds.     Comments: Small pinpoint red, raised lesion to nose  Neurological:     General: No focal deficit present.     Mental Status: She is alert and oriented to person, place, and time.     Motor: Weakness present.     Gait: Gait abnormal.     Comments: walker  Psychiatric:        Mood and Affect: Mood normal.        Behavior: Behavior normal.     Labs reviewed: Recent Labs    01/24/22 1340 03/26/22 1335 04/25/22 1420  NA 143 140 140  K 4.9 4.1 5.1  CL 110 106 108  CO2 '25 23 24  '$ GLUCOSE 95 109* 101*  BUN 45* 56* 51*  CREATININE 2.14* 2.77* 2.72*  CALCIUM 8.9 8.9 8.9   Recent Labs    01/24/22 1340 03/26/22 1335 04/25/22 1420  AST '15 16 16  '$ ALT '8 9 9  '$ ALKPHOS 46 62 51  BILITOT 0.3 0.3 0.4  PROT 6.0* 6.1* 5.6*  ALBUMIN 2.9* 3.0* 3.0*   Recent Labs    01/24/22 1340 03/26/22 1335 04/25/22 1420  WBC 14.3* 12.7* 12.8*  NEUTROABS 6.8 7.5 7.4  HGB 10.0* 8.0* 11.2*  HCT 31.0* 24.6* 34.6*  MCV 103.3* 101.2* 98.6  PLT 157 204 151   Lab Results  Component Value Date   TSH 10.52 (A) 01/05/2022   No results found for: "HGBA1C" No results  found for: "CHOL", "HDL", "LDLCALC", "LDLDIRECT", "TRIG", "CHOLHDL"  Significant Diagnostic Results in last 30 days:  No results  found.  Assessment/Plan 1. Lymphoma, marginal zone, lymph nodes of multiple sites [facility]) - followed by oncology - 07/03 received 2 units PRBC for hgb 8.0, improved to > 11.2 - cont Calquence and Famciclovir  2. Acquired hypothyroidism -TSH 0.07 (06/19)> was 10.52 (04/07) - cont levothyroxine  3. Bilateral leg edema - cont torsemide prn  4. CKD (chronic kidney disease) stage 4, GFR 15-29 ml/min (HCC) - avoid nephrotoxic drugs like NSAIDS and dose adjust medications to be renally excreted - encourage hydration with water   5. Anemia due to stage 4 chronic kidney disease ([place]) - see above  6. Trigeminal neuralgia - cont gabapentin  7. Skin lesion of face - small pinpoint red, raised lesion to nose - h/o basal cell carcinoma, fair skinned  - dermatology referral    Family/ staff Communication: plan discussed with patient and nurse  Labs/tests ordered: none

## 2022-05-29 ENCOUNTER — Inpatient Hospital Stay: Payer: Medicare Other

## 2022-05-29 ENCOUNTER — Other Ambulatory Visit: Payer: Self-pay

## 2022-05-29 ENCOUNTER — Inpatient Hospital Stay: Payer: Medicare Other | Attending: Hematology & Oncology | Admitting: Family

## 2022-05-29 ENCOUNTER — Encounter: Payer: Self-pay | Admitting: Family

## 2022-05-29 VITALS — BP 105/49 | HR 93 | Temp 97.7°F | Resp 18 | Ht 66.0 in

## 2022-05-29 DIAGNOSIS — N183 Chronic kidney disease, stage 3 unspecified: Secondary | ICD-10-CM | POA: Diagnosis not present

## 2022-05-29 DIAGNOSIS — Z7982 Long term (current) use of aspirin: Secondary | ICD-10-CM | POA: Insufficient documentation

## 2022-05-29 DIAGNOSIS — D631 Anemia in chronic kidney disease: Secondary | ICD-10-CM | POA: Insufficient documentation

## 2022-05-29 DIAGNOSIS — R5383 Other fatigue: Secondary | ICD-10-CM | POA: Diagnosis not present

## 2022-05-29 DIAGNOSIS — C8330 Diffuse large B-cell lymphoma, unspecified site: Secondary | ICD-10-CM | POA: Diagnosis present

## 2022-05-29 DIAGNOSIS — C8300 Small cell B-cell lymphoma, unspecified site: Secondary | ICD-10-CM | POA: Insufficient documentation

## 2022-05-29 DIAGNOSIS — D649 Anemia, unspecified: Secondary | ICD-10-CM

## 2022-05-29 DIAGNOSIS — C8588 Other specified types of non-Hodgkin lymphoma, lymph nodes of multiple sites: Secondary | ICD-10-CM

## 2022-05-29 DIAGNOSIS — D509 Iron deficiency anemia, unspecified: Secondary | ICD-10-CM

## 2022-05-29 DIAGNOSIS — K922 Gastrointestinal hemorrhage, unspecified: Secondary | ICD-10-CM | POA: Diagnosis not present

## 2022-05-29 DIAGNOSIS — K921 Melena: Secondary | ICD-10-CM | POA: Insufficient documentation

## 2022-05-29 DIAGNOSIS — R6883 Chills (without fever): Secondary | ICD-10-CM | POA: Diagnosis not present

## 2022-05-29 LAB — CBC WITH DIFFERENTIAL (CANCER CENTER ONLY)
Abs Immature Granulocytes: 0.06 10*3/uL (ref 0.00–0.07)
Basophils Absolute: 0 10*3/uL (ref 0.0–0.1)
Basophils Relative: 1 %
Eosinophils Absolute: 0.1 10*3/uL (ref 0.0–0.5)
Eosinophils Relative: 2 %
HCT: 26 % — ABNORMAL LOW (ref 36.0–46.0)
Hemoglobin: 8.6 g/dL — ABNORMAL LOW (ref 12.0–15.0)
Immature Granulocytes: 1 %
Lymphocytes Relative: 46 %
Lymphs Abs: 1.9 10*3/uL (ref 0.7–4.0)
MCH: 33.2 pg (ref 26.0–34.0)
MCHC: 33.1 g/dL (ref 30.0–36.0)
MCV: 100.4 fL — ABNORMAL HIGH (ref 80.0–100.0)
Monocytes Absolute: 0.7 10*3/uL (ref 0.1–1.0)
Monocytes Relative: 16 %
Neutro Abs: 1.4 10*3/uL — ABNORMAL LOW (ref 1.7–7.7)
Neutrophils Relative %: 34 %
Platelet Count: 142 10*3/uL — ABNORMAL LOW (ref 150–400)
RBC: 2.59 MIL/uL — ABNORMAL LOW (ref 3.87–5.11)
RDW: 17.7 % — ABNORMAL HIGH (ref 11.5–15.5)
WBC Count: 4.2 10*3/uL (ref 4.0–10.5)
nRBC: 0 % (ref 0.0–0.2)

## 2022-05-29 LAB — CMP (CANCER CENTER ONLY)
ALT: 8 U/L (ref 0–44)
AST: 12 U/L — ABNORMAL LOW (ref 15–41)
Albumin: 3 g/dL — ABNORMAL LOW (ref 3.5–5.0)
Alkaline Phosphatase: 54 U/L (ref 38–126)
Anion gap: 9 (ref 5–15)
BUN: 61 mg/dL — ABNORMAL HIGH (ref 8–23)
CO2: 22 mmol/L (ref 22–32)
Calcium: 8.9 mg/dL (ref 8.9–10.3)
Chloride: 107 mmol/L (ref 98–111)
Creatinine: 2.68 mg/dL — ABNORMAL HIGH (ref 0.44–1.00)
GFR, Estimated: 16 mL/min — ABNORMAL LOW (ref >60)
Glucose, Bld: 99 mg/dL (ref 70–99)
Potassium: 5.5 mmol/L — ABNORMAL HIGH (ref 3.5–5.1)
Sodium: 138 mmol/L (ref 135–145)
Total Bilirubin: 0.3 mg/dL (ref 0.3–1.2)
Total Protein: 5.9 g/dL — ABNORMAL LOW (ref 6.5–8.1)

## 2022-05-29 LAB — FERRITIN: Ferritin: 138 ng/mL (ref 11–307)

## 2022-05-29 LAB — IRON AND IRON BINDING CAPACITY (CC-WL,HP ONLY)
Iron: 27 ug/dL — ABNORMAL LOW (ref 28–170)
Saturation Ratios: 12 % (ref 10.4–31.8)
TIBC: 221 ug/dL — ABNORMAL LOW (ref 250–450)
UIBC: 194 ug/dL (ref 148–442)

## 2022-05-29 LAB — RETICULOCYTES
Immature Retic Fract: 10.2 % (ref 2.3–15.9)
RBC.: 2.57 MIL/uL — ABNORMAL LOW (ref 3.87–5.11)
Retic Count, Absolute: 52.4 10*3/uL (ref 19.0–186.0)
Retic Ct Pct: 2 % (ref 0.4–3.1)

## 2022-05-29 LAB — SAMPLE TO BLOOD BANK

## 2022-05-29 LAB — PREPARE RBC (CROSSMATCH)

## 2022-05-29 LAB — LACTATE DEHYDROGENASE: LDH: 173 U/L (ref 98–192)

## 2022-05-29 MED ORDER — EPOETIN ALFA-EPBX 40000 UNIT/ML IJ SOLN
40000.0000 [IU] | Freq: Once | INTRAMUSCULAR | Status: AC
  Filled 2022-05-29: qty 1

## 2022-05-29 NOTE — Progress Notes (Signed)
Hematology and Oncology Follow Up Visit  Joy Lynch [id number] 05-21-1926 88 y.o. 05/29/2022   Principle Diagnosis:  Marginal Zone lymphoma -- relapsed Anemia secondary to chronic kidney disease, stage III   Past Therapy: Umbralisib 400 mg po q day, started on 10/22/2020 - discontinued by manufacturer    Current Therapy:        Calquence 100 mg PO daily, started 05/25/2021 Retacrit SQ as indicated for Hgb < 11    Interim History:  Joy Lynch is here today with her nephew for follow-up. She is symptomatic with fatigue, chills and mild SOB with exertion.  She notes that she has been having black stools off and on. No bright red blood loss. No bruising or petechiae.  She is taking an aspirin 81 mg daily.  No fever, n/v, cough, rash, dizziness, SOB, chest pain, palpitations, abdominal pain or changes in bladder habits.  No swelling, numbness or tingling in her extremities.  No falls or syncope reported.  Appetite and hydration have been good.    ECOG Performance Status: 2 - Symptomatic, <50% confined to bed  Medications:  Allergies as of 05/29/2022       Reactions   Penicillins Hives   Tegretol [carbamazepine] Hives, Other (See Comments)   Turned purple from neck down and drowsy   Lyrica [pregabalin] Other (See Comments)   Drowsy        Medication List        Accurate as of May 29, 2022  1:35 PM. If you have any questions, ask your nurse or doctor.          acetaminophen 500 MG tablet Commonly known as: TYLENOL Take 500 mg by mouth every 6 (six) hours as needed.   acetaminophen 500 MG tablet Commonly known as: TYLENOL Take 500 mg by mouth 3 (three) times daily.   aspirin 81 MG chewable tablet Chew 81 mg by mouth daily.   Calquence 100 MG Tabs Generic drug: acalabrutinib maleate Take 100 mg by mouth daily.   cyanocobalamin 1000 MCG tablet Commonly known as: VITAMIN B12 Take 1 tablet (1,000 mcg total) by mouth daily.   famciclovir 125 MG  tablet Commonly known as: FAMVIR Take 250 mg by mouth daily. Two tablets   gabapentin 600 MG tablet Commonly known as: NEURONTIN Take 600 mg by mouth 3 (three) times daily.   lactose free nutrition Liqd Take 237 mLs by mouth daily.   levothyroxine 100 MCG tablet Commonly known as: SYNTHROID Take 112 mcg by mouth daily before breakfast.   Multi-Vitamins Tabs Take 1 tablet by mouth daily.   pantoprazole 40 MG tablet Commonly known as: PROTONIX Take 40 mg by mouth 2 (two) times daily.   polyvinyl alcohol 1.4 % ophthalmic solution Commonly known as: LIQUIFILM TEARS Place 1 drop into both eyes 4 (four) times daily as needed.   Pro-Stat Liqd Take 30 mLs by mouth daily.   simethicone 125 MG chewable tablet Commonly known as: MYLICON Chew 086 mg by mouth 2 (two) times daily as needed for flatulence.   torsemide 10 MG tablet Commonly known as: DEMADEX 10 mg daily as needed.   traMADol 50 MG tablet Commonly known as: ULTRAM Take 25 mg by mouth 3 (three) times daily.   white petrolatum ointment Apply topically 2 (two) times daily.   zinc oxide 20 % ointment Apply 1 application. topically as needed for irritation. To buttocks after every incontinent episode and as needed for redness. May keep at bedside.  Allergies:  Allergies  Allergen Reactions   Penicillins Hives   Tegretol [Carbamazepine] Hives and Other (See Comments)    Turned purple from neck down and drowsy    Lyrica [Pregabalin] Other (See Comments)    Drowsy    Past Medical History, Surgical history, Social history, and Family History were reviewed and updated.  Review of Systems: All other 10 point review of systems is negative.   Physical Exam:  vitals were not taken for this visit.   Wt Readings from Last 3 Encounters:  05/09/22 127 lb (57.6 kg)  03/26/22 123 lb 1.6 oz (55.8 kg)  03/20/22 131 lb 6.4 oz (59.6 kg)    Ocular: Sclerae unicteric, pupils equal, round and reactive to  light Ear-nose-throat: Oropharynx clear, dentition fair Lymphatic: No cervical or supraclavicular adenopathy Lungs no rales or rhonchi, good excursion bilaterally Heart regular rate and rhythm, no murmur appreciated Abd soft, nontender, positive bowel sounds MSK no focal spinal tenderness, no joint edema Neuro: non-focal, well-oriented, appropriate affect Breasts: Deferred  Lab Results  Component Value Date   WBC 4.2 05/29/2022   HGB 8.6 (L) 05/29/2022   HCT 26.0 (L) 05/29/2022   MCV 100.4 (H) 05/29/2022   PLT 142 (L) 05/29/2022   Lab Results  Component Value Date   FERRITIN 80 04/25/2022   IRON 78 04/25/2022   TIBC 206 (L) 04/25/2022   UIBC 128 (L) 04/25/2022   IRONPCTSAT 38 (H) 04/25/2022   Lab Results  Component Value Date   RETICCTPCT 2.0 05/29/2022   RBC 2.57 (L) 05/29/2022   Lab Results  Component Value Date   KPAFRELGTCHN 20.9 (H) 11/16/2021   LAMBDASER 19.0 11/16/2021   KAPLAMBRATIO 1.10 11/16/2021   Lab Results  Component Value Date   IGGSERUM 168 (L) 08/21/2021   IGA 221 08/21/2021   IGMSERUM 1,672 (H) 08/21/2021   No results found for: "TOTALPROTELP", "ALBUMINELP", "A1GS", "A2GS", "BETS", "BETA2SER", "GAMS", "MSPIKE", "SPEI"   Chemistry      Component Value Date/Time   NA 140 04/25/2022 1420   NA 137 11/10/2020 0000   NA 138 08/16/2017 1256   K 5.1 04/25/2022 1420   K 4.3 08/16/2017 1256   CL 108 04/25/2022 1420   CO2 24 04/25/2022 1420   CO2 25 08/16/2017 1256   BUN 51 (H) 04/25/2022 1420   BUN 16 11/10/2020 0000   BUN 19.6 08/16/2017 1256   CREATININE 2.72 (H) 04/25/2022 1420   CREATININE 0.7 08/16/2017 1256   GLU 78 11/10/2020 0000      Component Value Date/Time   CALCIUM 8.9 04/25/2022 1420   CALCIUM 9.2 08/16/2017 1256   ALKPHOS 51 04/25/2022 1420   ALKPHOS 42 08/16/2017 1256   AST 16 04/25/2022 1420   AST 18 08/16/2017 1256   ALT 9 04/25/2022 1420   ALT 11 08/16/2017 1256   BILITOT 0.4 04/25/2022 1420   BILITOT 0.87 08/16/2017  1256       Impression and Plan: Joy Lynch is a very pleasant 88 yo caucasian female with relapsed marginal zone lymphoma and multifactorial anemia.  Hgb has dropped from 11.2 1 month ago now to 8.6, MCV 100.  We will get her set up for 2 units of blood and IV iron on Thursday per patient preference.  She will stop taking her daily aspirin.  Referral placed with GI for blood in stool.  Follow-up in 3 weeks.   Lottie Dawson, NP 8/29/20231:35 PM

## 2022-05-29 NOTE — Patient Instructions (Signed)

## 2022-05-30 ENCOUNTER — Ambulatory Visit: Payer: Medicare Other | Admitting: Family

## 2022-05-30 ENCOUNTER — Ambulatory Visit: Payer: Medicare Other

## 2022-05-30 ENCOUNTER — Inpatient Hospital Stay: Payer: Medicare Other

## 2022-05-30 ENCOUNTER — Other Ambulatory Visit: Payer: Self-pay | Admitting: Family

## 2022-06-01 ENCOUNTER — Inpatient Hospital Stay: Payer: Medicare Other | Attending: Hematology & Oncology

## 2022-06-01 ENCOUNTER — Inpatient Hospital Stay: Payer: Medicare Other

## 2022-06-01 VITALS — BP 147/80 | HR 90 | Temp 98.0°F | Resp 15

## 2022-06-01 DIAGNOSIS — E8809 Other disorders of plasma-protein metabolism, not elsewhere classified: Secondary | ICD-10-CM

## 2022-06-01 DIAGNOSIS — D649 Anemia, unspecified: Secondary | ICD-10-CM

## 2022-06-01 DIAGNOSIS — D631 Anemia in chronic kidney disease: Secondary | ICD-10-CM | POA: Insufficient documentation

## 2022-06-01 DIAGNOSIS — N1832 Chronic kidney disease, stage 3b: Secondary | ICD-10-CM

## 2022-06-01 DIAGNOSIS — N183 Chronic kidney disease, stage 3 unspecified: Secondary | ICD-10-CM | POA: Diagnosis present

## 2022-06-01 DIAGNOSIS — D509 Iron deficiency anemia, unspecified: Secondary | ICD-10-CM

## 2022-06-01 DIAGNOSIS — C8300 Small cell B-cell lymphoma, unspecified site: Secondary | ICD-10-CM | POA: Diagnosis not present

## 2022-06-01 DIAGNOSIS — C8588 Other specified types of non-Hodgkin lymphoma, lymph nodes of multiple sites: Secondary | ICD-10-CM

## 2022-06-01 DIAGNOSIS — Z8572 Personal history of non-Hodgkin lymphomas: Secondary | ICD-10-CM

## 2022-06-01 MED ORDER — SODIUM CHLORIDE 0.9 % IV SOLN: Freq: Once | INTRAVENOUS | Status: AC

## 2022-06-01 MED ORDER — DIPHENHYDRAMINE HCL 25 MG PO CAPS
25.0000 mg | ORAL_CAPSULE | Freq: Once | ORAL | Status: AC
  Administered 2022-06-01: 25 mg via ORAL
  Filled 2022-06-01: qty 1

## 2022-06-01 MED ORDER — SODIUM CHLORIDE 0.9 % IV SOLN
510.0000 mg | Freq: Once | INTRAVENOUS | Status: AC
  Administered 2022-06-01: 510 mg via INTRAVENOUS
  Filled 2022-06-01: qty 17

## 2022-06-01 MED ORDER — ACETAMINOPHEN 325 MG PO TABS
650.0000 mg | ORAL_TABLET | Freq: Once | ORAL | Status: AC
  Administered 2022-06-01: 650 mg via ORAL
  Filled 2022-06-01: qty 2

## 2022-06-01 NOTE — Patient Instructions (Addendum)
Ferumoxytol Injection What is this medication? FERUMOXYTOL (FER ue MOX i tol) treats low levels of iron in your body (iron deficiency anemia). Iron is a mineral that plays an important role in making red blood cells, which carry oxygen from your lungs to the rest of your body. This medicine may be used for other purposes; ask your health care provider or pharmacist if you have questions. COMMON BRAND NAME(S): Feraheme What should I tell my care team before I take this medication? They need to know if you have any of these conditions: Anemia not caused by low iron levels High levels of iron in the blood Magnetic resonance imaging (MRI) test scheduled An unusual or allergic reaction to iron, other medications, foods, dyes, or preservatives Pregnant or trying to get pregnant Breast-feeding How should I use this medication? This medication is for injection into a vein. It is given in a hospital or clinic setting. Talk to your care team the use of this medication in children. Special care may be needed. Overdosage: If you think you have taken too much of this medicine contact a poison control center or emergency room at once. NOTE: This medicine is only for you. Do not share this medicine with others. What if I miss a dose? It is important not to miss your dose. Call your care team if you are unable to keep an appointment. What may interact with this medication? Other iron products This list may not describe all possible interactions. Give your health care provider a list of all the medicines, herbs, non-prescription drugs, or dietary supplements you use. Also tell them if you smoke, drink alcohol, or use illegal drugs. Some items may interact with your medicine. What should I watch for while using this medication? Visit your care team regularly. Tell your care team if your symptoms do not start to get better or if they get worse. You may need blood work done while you are taking this  medication. You may need to follow a special diet. Talk to your care team. Foods that contain iron include: whole grains/cereals, dried fruits, beans, or peas, leafy green vegetables, and organ meats (liver, kidney). What side effects may I notice from receiving this medication? Side effects that you should report to your care team as soon as possible: Allergic reactions--skin rash, itching, hives, swelling of the face, lips, tongue, or throat Low blood pressure--dizziness, feeling faint or lightheaded, blurry vision Shortness of breath Side effects that usually do not require medical attention (report to your care team if they continue or are bothersome): Flushing Headache Joint pain Muscle pain Nausea Pain, redness, or irritation at injection site This list may not describe all possible side effects. Call your doctor for medical advice about side effects. You may report side effects to FDA at 1-800-FDA-1088. Where should I keep my medication? This medication is given in a hospital or clinic and will not be stored at home. NOTE: This sheet is a summary. It may not cover all possible information. If you have questions about this medicine, talk to your doctor, pharmacist, or health care provider.  2023 Elsevier/Gold Standard (2021-02-10 00:00:00)   Blood Transfusion, Adult A blood transfusion is a procedure in which you receive blood or a type of blood cell (blood component) through an IV. You may need a blood transfusion when you have a low blood count, which is a low number of any blood cell. This may result from a bleeding disorder, illness, injury, or surgery. The blood may  come from a donor, or you may be able to have your own blood collected and stored (autologous blood donation) before a planned surgery. The blood given in a transfusion may be made up of different blood components. You may receive: Red blood cells. These carry oxygen to the cells in the body. Platelets. These help  your blood to clot. Plasma. This is the liquid part of your blood. It carries proteins and other substances throughout the body. White blood cells. These help you fight infections. If you have hemophilia or another clotting disorder, you may also receive other types of blood products. Depending on the type of blood product, this procedure may take 1-4 hours to complete. Tell a health care provider about: Any bleeding problems you have. Any previous reactions you have had during a blood transfusion. Any allergies you have. All medicines you are taking, including vitamins, herbs, eye drops, creams, and over-the-counter medicines. Any surgeries you have had. Any medical conditions you have. Whether you are pregnant or may be pregnant. What are the risks? Talk with your health care provider about risks. The most common problems include: A mild allergic reaction, such as red, swollen areas of skin (hives) and itching. Fever or chills. This may be the body's response to new blood cells received. This may occur during or up to 4 hours after the transfusion. More serious problems may include: A serious allergic reaction that causes difficulty breathing or swelling around the face and lips. Transfusion-associated circulatory overload (TACO), or too much fluid in the lungs. This may cause breathing problems. Transfusion-related acute lung injury (TRALI), which causes breathing difficulty and low oxygen in the blood. This can occur within hours of the transfusion or several days later. Iron overload. This can happen after receiving many blood transfusions over a period of time. Infection or virus being transmitted. This is rare because donated blood is carefully tested before it is given. Hemolytic transfusion reaction. This is rare. It happens when the body's defense system (immune system)tries to attack the new blood cells. Symptoms may include fever, chills, nausea, low blood pressure, and low back  or chest pain. Transfusion-associated graft-versus-host disease (TAGVHD). This is rare. It happens when donated cells attack the body's healthy tissues. What happens before the procedure? You will have a blood test to check your blood type. This test is done to know what kind of blood your body will accept and to match it to the donor blood. If you are going to have a planned surgery, you may be able to do an autologous blood donation. This may be done in case you need to have a transfusion. You will have your temperature, blood pressure, and pulse checked before the transfusion. If you have had an allergic reaction to a transfusion in the past, you may be given medicine to help prevent a reaction. This medicine may be given to you by mouth (orally) or through an IV. What happens during the procedure?  An IV will be inserted into one of your veins. The bag of blood will be attached to your IV. The blood will then enter through your vein. Your temperature, blood pressure, and pulse will be monitored during the transfusion. This monitoring is done to detect early signs of a transfusion reaction. Tell your nurse right away if you have any of these symptoms during the transfusion: Shortness of breath or trouble breathing. Chest or back pain. Fever or chills. Itching or hives. If you have any signs or symptoms of a  reaction, your transfusion will be stopped and you may be given medicine. When the transfusion is complete, your IV will be removed. Pressure may be applied to the IV site for a few minutes. A bandage (dressing)will be applied. The procedure may vary among health care providers and hospitals. What happens after the procedure? Your temperature, blood pressure, pulse, breathing rate, and blood oxygen level will be monitored until you leave the hospital or clinic. Your blood may be tested to see how you have responded to the transfusion. You may be warmed with fluids or blankets to  maintain a normal body temperature. If you receive your blood transfusion in an outpatient setting, you will be told whom to contact to report any reactions. Where to find more information Visit the [employer]: redcross.org Summary A blood transfusion is a procedure in which you receive blood or a type of blood cell (blood component) through an IV. The blood given in a transfusion may be made up of different blood components. You may receive red blood cells, platelets, plasma, or white blood cells depending on the condition treated. Your temperature, blood pressure, and pulse will be monitored before, during, and after the transfusion. After the transfusion, your blood may be tested to see how your body has responded. This information is not intended to replace advice given to you by your health care provider. Make sure you discuss any questions you have with your health care provider. Document Revised: 12/15/2021 Document Reviewed: 12/15/2021 Elsevier Patient Education  [place].

## 2022-06-02 LAB — BPAM RBC
Blood Product Expiration Date: 202309202359
Blood Product Expiration Date: 202309202359
ISSUE DATE / TIME: 202309010758
ISSUE DATE / TIME: 202309010758
Unit Type and Rh: 7300
Unit Type and Rh: 7300

## 2022-06-02 LAB — TYPE AND SCREEN
ABO/RH(D): B POS
Antibody Screen: NEGATIVE
Unit division: 0
Unit division: 0

## 2022-06-06 ENCOUNTER — Other Ambulatory Visit (HOSPITAL_COMMUNITY): Payer: Self-pay

## 2022-06-19 ENCOUNTER — Ambulatory Visit: Payer: Medicare Other | Admitting: Family

## 2022-06-19 ENCOUNTER — Other Ambulatory Visit: Payer: Medicare Other

## 2022-06-26 ENCOUNTER — Inpatient Hospital Stay: Payer: Medicare Other

## 2022-06-26 ENCOUNTER — Inpatient Hospital Stay (HOSPITAL_BASED_OUTPATIENT_CLINIC_OR_DEPARTMENT_OTHER): Payer: Medicare Other | Admitting: Family

## 2022-06-26 ENCOUNTER — Encounter: Payer: Self-pay | Admitting: Family

## 2022-06-26 VITALS — BP 104/44 | HR 109 | Temp 97.6°F | Resp 17 | Wt 125.0 lb

## 2022-06-26 DIAGNOSIS — D649 Anemia, unspecified: Secondary | ICD-10-CM

## 2022-06-26 DIAGNOSIS — K922 Gastrointestinal hemorrhage, unspecified: Secondary | ICD-10-CM

## 2022-06-26 DIAGNOSIS — C8588 Other specified types of non-Hodgkin lymphoma, lymph nodes of multiple sites: Secondary | ICD-10-CM

## 2022-06-26 DIAGNOSIS — D509 Iron deficiency anemia, unspecified: Secondary | ICD-10-CM | POA: Diagnosis not present

## 2022-06-26 DIAGNOSIS — E8809 Other disorders of plasma-protein metabolism, not elsewhere classified: Secondary | ICD-10-CM

## 2022-06-26 DIAGNOSIS — N1832 Chronic kidney disease, stage 3b: Secondary | ICD-10-CM

## 2022-06-26 DIAGNOSIS — Z8572 Personal history of non-Hodgkin lymphomas: Secondary | ICD-10-CM

## 2022-06-26 DIAGNOSIS — N183 Chronic kidney disease, stage 3 unspecified: Secondary | ICD-10-CM | POA: Diagnosis not present

## 2022-06-26 DIAGNOSIS — D631 Anemia in chronic kidney disease: Secondary | ICD-10-CM

## 2022-06-26 LAB — CBC WITH DIFFERENTIAL (CANCER CENTER ONLY)
Abs Immature Granulocytes: 0.56 10*3/uL — ABNORMAL HIGH (ref 0.00–0.07)
Basophils Absolute: 0 10*3/uL (ref 0.0–0.1)
Basophils Relative: 1 %
Eosinophils Absolute: 0.1 10*3/uL (ref 0.0–0.5)
Eosinophils Relative: 1 %
HCT: 29.9 % — ABNORMAL LOW (ref 36.0–46.0)
Hemoglobin: 10.1 g/dL — ABNORMAL LOW (ref 12.0–15.0)
Immature Granulocytes: 8 %
Lymphocytes Relative: 30 %
Lymphs Abs: 2.1 10*3/uL (ref 0.7–4.0)
MCH: 32.7 pg (ref 26.0–34.0)
MCHC: 33.8 g/dL (ref 30.0–36.0)
MCV: 96.8 fL (ref 80.0–100.0)
Monocytes Absolute: 1 10*3/uL (ref 0.1–1.0)
Monocytes Relative: 14 %
Neutro Abs: 3.2 10*3/uL (ref 1.7–7.7)
Neutrophils Relative %: 46 %
Platelet Count: 160 10*3/uL (ref 150–400)
RBC: 3.09 MIL/uL — ABNORMAL LOW (ref 3.87–5.11)
RDW: 17 % — ABNORMAL HIGH (ref 11.5–15.5)
Smear Review: NORMAL
WBC Count: 6.9 10*3/uL (ref 4.0–10.5)
nRBC: 0 % (ref 0.0–0.2)

## 2022-06-26 LAB — CMP (CANCER CENTER ONLY)
ALT: 9 U/L (ref 0–44)
AST: 12 U/L — ABNORMAL LOW (ref 15–41)
Albumin: 2.8 g/dL — ABNORMAL LOW (ref 3.5–5.0)
Alkaline Phosphatase: 69 U/L (ref 38–126)
Anion gap: 10 (ref 5–15)
BUN: 50 mg/dL — ABNORMAL HIGH (ref 8–23)
CO2: 22 mmol/L (ref 22–32)
Calcium: 8.6 mg/dL — ABNORMAL LOW (ref 8.9–10.3)
Chloride: 107 mmol/L (ref 98–111)
Creatinine: 2.77 mg/dL — ABNORMAL HIGH (ref 0.44–1.00)
GFR, Estimated: 15 mL/min — ABNORMAL LOW (ref >60)
Glucose, Bld: 101 mg/dL — ABNORMAL HIGH (ref 70–99)
Potassium: 4.2 mmol/L (ref 3.5–5.1)
Sodium: 139 mmol/L (ref 135–145)
Total Bilirubin: 0.5 mg/dL (ref 0.3–1.2)
Total Protein: 5.8 g/dL — ABNORMAL LOW (ref 6.5–8.1)

## 2022-06-26 LAB — SAMPLE TO BLOOD BANK

## 2022-06-26 LAB — RETICULOCYTES
Immature Retic Fract: 9.4 % (ref 2.3–15.9)
RBC.: 3.1 MIL/uL — ABNORMAL LOW (ref 3.87–5.11)
Retic Count, Absolute: 36.6 10*3/uL (ref 19.0–186.0)
Retic Ct Pct: 1.2 % (ref 0.4–3.1)

## 2022-06-26 LAB — SAVE SMEAR(SSMR), FOR PROVIDER SLIDE REVIEW

## 2022-06-26 LAB — IRON AND IRON BINDING CAPACITY (CC-WL,HP ONLY)
Iron: 59 ug/dL (ref 28–170)
Saturation Ratios: 38 % — ABNORMAL HIGH (ref 10.4–31.8)
TIBC: 155 ug/dL — ABNORMAL LOW (ref 250–450)
UIBC: 96 ug/dL — ABNORMAL LOW (ref 148–442)

## 2022-06-26 LAB — FERRITIN: Ferritin: 824 ng/mL — ABNORMAL HIGH (ref 11–307)

## 2022-06-26 LAB — LACTATE DEHYDROGENASE: LDH: 243 U/L — ABNORMAL HIGH (ref 98–192)

## 2022-06-26 MED ORDER — EPOETIN ALFA-EPBX 40000 UNIT/ML IJ SOLN
40000.0000 [IU] | Freq: Once | INTRAMUSCULAR | Status: AC
  Administered 2022-06-26: 40000 [IU] via SUBCUTANEOUS
  Filled 2022-06-26: qty 1

## 2022-06-26 NOTE — Patient Instructions (Signed)

## 2022-06-26 NOTE — Progress Notes (Signed)
Hematology and Oncology Follow Up Visit  Joy Lynch [id number] [birth year] 88 y.o. 06/26/2022   Principle Diagnosis:  Marginal Zone lymphoma -- relapsed Anemia secondary to chronic kidney disease, stage III   Past Therapy: Umbralisib 400 mg po q day, started on 10/22/2020 - discontinued by manufacturer    Current Therapy:        Calquence 100 mg PO daily, started 05/25/2021 Retacrit SQ as indicated for Hgb < 11    Interim History:  Joy Lynch is here today for follow-up and injection. She is doing fairly well but still notes fatigue.  Hgb is now 10.1, MCV 96, platelets 160 and WBC count 6.9.  She has not noted any blood loss. No abnormal bruising or petechiae.  She would like to hold off for a few months before going to see GI.  No fever, chills, n/v, cough, rash, dizziness, SOB, chest pain, palpitations, abdominal pain or changes in bowel or bladder habits.  No swelling, numbness or tingling in her extremities.  No falls or syncope reported.  Appetite is ok and she is doing her best to stay well hydrated. Weight stable at 125 lbs.   ECOG Performance Status: 2 - Symptomatic, <50% confined to bed  Medications:  Allergies as of 06/26/2022       Reactions   Penicillins Hives   Tegretol [carbamazepine] Hives, Other (See Comments)   Turned purple from neck down and drowsy   Lyrica [pregabalin] Other (See Comments)   Drowsy        Medication List        Accurate as of June 26, 2022  1:32 PM. If you have any questions, ask your nurse or doctor.          acetaminophen 500 MG tablet Commonly known as: TYLENOL Take 500 mg by mouth every 6 (six) hours as needed.   acetaminophen 500 MG tablet Commonly known as: TYLENOL Take 500 mg by mouth 3 (three) times daily.   aspirin 81 MG chewable tablet Chew 81 mg by mouth daily.   Calquence 100 MG tablet Generic drug: acalabrutinib maleate Take 100 mg by mouth daily.   cyanocobalamin 1000 MCG tablet Commonly known as:  VITAMIN B12 Take 1 tablet (1,000 mcg total) by mouth daily.   famciclovir 125 MG tablet Commonly known as: FAMVIR Take 250 mg by mouth daily. Two tablets   gabapentin 600 MG tablet Commonly known as: NEURONTIN Take 600 mg by mouth 3 (three) times daily.   lactose free nutrition Liqd Take 237 mLs by mouth daily.   levothyroxine 100 MCG tablet Commonly known as: SYNTHROID Take 112 mcg by mouth daily before breakfast.   Multi-Vitamins Tabs Take 1 tablet by mouth daily.   pantoprazole 40 MG tablet Commonly known as: PROTONIX Take 40 mg by mouth 2 (two) times daily.   polyvinyl alcohol 1.4 % ophthalmic solution Commonly known as: LIQUIFILM TEARS Place 1 drop into both eyes 4 (four) times daily as needed.   Pro-Stat Liqd Take 30 mLs by mouth daily.   simethicone 125 MG chewable tablet Commonly known as: MYLICON Chew 384 mg by mouth 2 (two) times daily as needed for flatulence.   torsemide 10 MG tablet Commonly known as: DEMADEX 10 mg daily as needed.   traMADol 50 MG tablet Commonly known as: ULTRAM Take 25 mg by mouth 3 (three) times daily.   white petrolatum ointment Apply topically 2 (two) times daily.   zinc oxide 20 % ointment Apply 1 application. topically as needed for  irritation. To buttocks after every incontinent episode and as needed for redness. May keep at bedside.        Allergies:  Allergies  Allergen Reactions   Penicillins Hives   Tegretol [Carbamazepine] Hives and Other (See Comments)    Turned purple from neck down and drowsy    Lyrica [Pregabalin] Other (See Comments)    Drowsy    Past Medical History, Surgical history, Social history, and Family History were reviewed and updated.  Review of Systems: All other 10 point review of systems is negative.   Physical Exam:  weight is 125 lb (56.7 kg). Her oral temperature is 97.6 F (36.4 C). Her blood pressure is 104/44 (abnormal) and her pulse is 109 (abnormal). Her respiration is 17  and oxygen saturation is 95%.   Wt Readings from Last 3 Encounters:  06/26/22 125 lb (56.7 kg)  05/09/22 127 lb (57.6 kg)  03/26/22 123 lb 1.6 oz (55.8 kg)    Ocular: Sclerae unicteric, pupils equal, round and reactive to light Ear-nose-throat: Oropharynx clear, dentition fair Lymphatic: No cervical or supraclavicular adenopathy Lungs no rales or rhonchi, good excursion bilaterally Heart regular rate and rhythm, no murmur appreciated Abd soft, nontender, positive bowel sounds MSK no focal spinal tenderness, no joint edema Neuro: non-focal, well-oriented, appropriate affect Breasts: Deferred  Lab Results  Component Value Date   WBC 6.9 06/26/2022   HGB 10.1 (L) 06/26/2022   HCT 29.9 (L) 06/26/2022   MCV 96.8 06/26/2022   PLT 160 06/26/2022   Lab Results  Component Value Date   FERRITIN 138 05/29/2022   IRON 27 (L) 05/29/2022   TIBC 221 (L) 05/29/2022   UIBC 194 05/29/2022   IRONPCTSAT 12 05/29/2022   Lab Results  Component Value Date   RETICCTPCT 1.2 06/26/2022   RBC 3.10 (L) 06/26/2022   Lab Results  Component Value Date   KPAFRELGTCHN 20.9 (H) 11/16/2021   LAMBDASER 19.0 11/16/2021   KAPLAMBRATIO 1.10 11/16/2021   Lab Results  Component Value Date   IGGSERUM 168 (L) 08/21/2021   IGA 221 08/21/2021   IGMSERUM 1,672 (H) 08/21/2021   No results found for: "TOTALPROTELP", "ALBUMINELP", "A1GS", "A2GS", "BETS", "BETA2SER", "GAMS", "MSPIKE", "SPEI"   Chemistry      Component Value Date/Time   NA 138 05/29/2022 1306   NA 137 11/10/2020 0000   NA 138 08/16/2017 1256   K 5.5 (H) 05/29/2022 1306   K 4.3 08/16/2017 1256   CL 107 05/29/2022 1306   CO2 22 05/29/2022 1306   CO2 25 08/16/2017 1256   BUN 61 (H) 05/29/2022 1306   BUN 16 11/10/2020 0000   BUN 19.6 08/16/2017 1256   CREATININE 2.68 (H) 05/29/2022 1306   CREATININE 0.7 08/16/2017 1256   GLU 78 11/10/2020 0000      Component Value Date/Time   CALCIUM 8.9 05/29/2022 1306   CALCIUM 9.2 08/16/2017 1256    ALKPHOS 54 05/29/2022 1306   ALKPHOS 42 08/16/2017 1256   AST 12 (L) 05/29/2022 1306   AST 18 08/16/2017 1256   ALT 8 05/29/2022 1306   ALT 11 08/16/2017 1256   BILITOT 0.3 05/29/2022 1306   BILITOT 0.87 08/16/2017 1256       Impression and Plan: Joy Lynch is a very pleasant 88 yo caucasian female with relapsed marginal zone lymphoma and multifactorial anemia.  ESA given, Hgb 10.1.  Iron studies pending.  Follow-up in 3 weeks.   Lottie Dawson, NP 9/26/20231:32 PM

## 2022-07-04 ENCOUNTER — Other Ambulatory Visit: Payer: Self-pay

## 2022-07-04 MED ORDER — CALQUENCE 100 MG PO TABS: 100.0000 mg | ORAL_TABLET | Freq: Every day | ORAL | 11 refills | Status: DC

## 2022-07-05 ENCOUNTER — Encounter: Payer: Self-pay | Admitting: Orthopedic Surgery

## 2022-07-05 ENCOUNTER — Non-Acute Institutional Stay: Payer: Medicare Other | Admitting: Orthopedic Surgery

## 2022-07-05 DIAGNOSIS — N184 Chronic kidney disease, stage 4 (severe): Secondary | ICD-10-CM

## 2022-07-05 DIAGNOSIS — R600 Localized edema: Secondary | ICD-10-CM | POA: Diagnosis not present

## 2022-07-05 DIAGNOSIS — C8588 Other specified types of non-Hodgkin lymphoma, lymph nodes of multiple sites: Secondary | ICD-10-CM | POA: Diagnosis not present

## 2022-07-05 DIAGNOSIS — M25512 Pain in left shoulder: Secondary | ICD-10-CM

## 2022-07-05 DIAGNOSIS — E039 Hypothyroidism, unspecified: Secondary | ICD-10-CM

## 2022-07-05 DIAGNOSIS — G500 Trigeminal neuralgia: Secondary | ICD-10-CM

## 2022-07-05 DIAGNOSIS — D631 Anemia in chronic kidney disease: Secondary | ICD-10-CM

## 2022-07-05 DIAGNOSIS — K219 Gastro-esophageal reflux disease without esophagitis: Secondary | ICD-10-CM

## 2022-07-05 MED ORDER — METHOCARBAMOL 500 MG PO TABS: 250.0000 mg | ORAL_TABLET | Freq: Three times a day (TID) | ORAL | 0 refills | Status: AC | PRN

## 2022-07-05 MED ORDER — LIDOCAINE 4 % EX PTCH: 1.0000 | MEDICATED_PATCH | CUTANEOUS | 0 refills | Status: AC

## 2022-07-05 NOTE — Progress Notes (Signed)
Location:  [facility] Number: 9/A Place of Service:  ALF [phone]) Provider:  Yvonna Alanis, NP   Virgie Dad, MD  Patient Care Team: Virgie Dad, MD as PCP - General (Internal Medicine)  Extended Emergency Contact Information Primary Emergency Contact: Genia Plants Address: [address]          [place], [place] [address] Johnnette Litter of [place] Phone: [phone] Relation: Alanson Puls Secondary Emergency Contact: [name] Address: [address]          [place], [place] [address] Johnnette Litter of [place] Phone: [phone] Mobile Phone: [phone] Relation: Nephew  Code Status:  Full code Goals of care: Advanced Directive information    06/26/2022    1:30 PM  Advanced Directives  Does Patient Have a Medical Advance Directive? Yes  Type of Paramedic of [place];Living will  Copy of [place] in Chart? Yes - validated most recent copy scanned in chart (See row information)     Chief Complaint  Patient presents with   Acute Visit    Left shoulder pain    HPI:  Pt is a 88 y.o. female seen today for acute visit due to left shoulder pain.   Increased left shoulder pain x 1 day. She woke up with increased pain. Pain increased with movement, described as tightness, no radiation. Tylenol unsuccessful at relieving pain. She also takes tramadol and gabapentin. H/o right shoulder replacement. No recent fall or injury.   Lymphoma- followed by oncology, marginal zone lymphoma- relapsed, WBC 10.1 (09/26)> was 8.6 (05/29/2022), continues to receive blood transfusions- 2 units PRBC 06/18/2022,  remains on Calquence, Famciclovir, and Retacrit SQ for hgb < 11 Hypothyroidism- TSH 0.07 (06/19)> was 10.52 (04/07), remains on levothyroxine BLE- remains on torsemide prn CKD- BUN/creat 50/2.77 06/26/2022 GERD- hgb 10.1 06/26/2022, remains on Protonix Anemia- see above Trigeminal neuralgia- remains on  gabapentin  Past Medical History:  Diagnosis Date   Cancer ([place])    non hodgkin lymphoma   Chronic lymphocytic leukemia ([place])    History of B-cell lymphoma 11/02/2015   Migraines    Osteoporosis    Thyroid disease    Trigeminal neuralgia of left side of face    Past Surgical History:  Procedure Laterality Date   gamma knife     for trigeminal neuralgia   SHOULDER SURGERY     Right shoulder replacement   TONSILLECTOMY AND ADENOIDECTOMY     WISDOM TOOTH EXTRACTION      Allergies  Allergen Reactions   Penicillins Hives   Tegretol [Carbamazepine] Hives and Other (See Comments)    Turned purple from neck down and drowsy    Lyrica [Pregabalin] Other (See Comments)    Drowsy    Outpatient Encounter Medications as of 07/05/2022  Medication Sig   acalabrutinib maleate (CALQUENCE) 100 MG tablet Take 1 tablet (100 mg total) by mouth daily.   acetaminophen (TYLENOL) 500 MG tablet Take 500 mg by mouth every 6 (six) hours as needed.   acetaminophen (TYLENOL) 500 MG tablet Take 500 mg by mouth 3 (three) times daily.   Amino Acids-Protein Hydrolys (PRO-STAT) LIQD Take 30 mLs by mouth daily.   aspirin 81 MG chewable tablet Chew 81 mg by mouth daily.   famciclovir (FAMVIR) 125 MG tablet Take 250 mg by mouth daily. Two tablets   gabapentin (NEURONTIN) 600 MG tablet Take 600 mg by mouth 3 (three) times daily.   lactose free nutrition (BOOST) LIQD Take  237 mLs by mouth daily.   levothyroxine (SYNTHROID) 100 MCG tablet Take 112 mcg by mouth daily before breakfast.   Multiple Vitamin (MULTI-VITAMINS) TABS Take 1 tablet by mouth daily.   pantoprazole (PROTONIX) 40 MG tablet Take 40 mg by mouth 2 (two) times daily.   polyvinyl alcohol (LIQUIFILM TEARS) 1.4 % ophthalmic solution Place 1 drop into both eyes 4 (four) times daily as needed.   simethicone (MYLICON) 194 MG chewable tablet Chew 125 mg by mouth 2 (two) times daily as needed for flatulence.   torsemide (DEMADEX) 10 MG tablet 10 mg daily  as needed.   traMADol (ULTRAM) 50 MG tablet Take 25 mg by mouth 3 (three) times daily.   vitamin B-12 (CYANOCOBALAMIN) 1000 MCG tablet Take 1 tablet (1,000 mcg total) by mouth daily.   white petrolatum ointment Apply topically 2 (two) times daily.   zinc oxide 20 % ointment Apply 1 application. topically as needed for irritation. To buttocks after every incontinent episode and as needed for redness. May keep at bedside.   Facility-Administered Encounter Medications as of 07/05/2022  Medication   epoetin alfa-epbx (RETACRIT) injection 40,000 Units    Review of Systems  Constitutional:  Negative for appetite change, chills, fatigue and fever.  HENT:  Negative for congestion and sore throat.   Eyes:  Negative for visual disturbance.  Respiratory:  Negative for cough, shortness of breath and wheezing.   Cardiovascular:  Positive for leg swelling. Negative for chest pain.  Gastrointestinal:  Negative for abdominal distention, abdominal pain, constipation, diarrhea, nausea and vomiting.  Genitourinary:  Negative for dysuria and frequency.  Musculoskeletal:  Positive for arthralgias and gait problem.  Skin:  Negative for wound.  Neurological:  Positive for weakness. Negative for dizziness and headaches.  Psychiatric/Behavioral:  Negative for confusion and dysphoric mood. The patient is not nervous/anxious.     Immunization History  Administered Date(s) Administered   Fluad Quad(high Dose 65+) 05/28/2019   Influenza Split 07/12/2016, 08/13/2017, 06/02/2019, 09/09/2020   Influenza, High Dose Seasonal PF 06/11/2017, 06/28/2018   Influenza-Unspecified 06/21/2020, 07/25/2021   Moderna SARS-COV2 Booster Vaccination 03/08/2021   Moderna Sars-Covid-2 Vaccination 10/05/2019, 11/02/2019, 08/15/2020   PFIZER(Purple Top)SARS-COV-2 Vaccination 06/21/2021   Pneumococcal Conjugate-13 11/22/2014   Pneumococcal Polysaccharide-23 04/29/2009, 08/13/2017   Td 01/24/2013   Tdap 12/28/2017, 07/01/2018,  12/13/2020   Zoster Recombinat (Shingrix) 07/01/2018   Zoster, Live 11/12/2009, 07/01/2018   Zoster, Unspecified 12/17/2005   Pertinent  Health Maintenance Due  Topic Date Due   INFLUENZA VACCINE  05/01/2022   DEXA SCAN  Completed      01/24/2022    2:25 PM 03/26/2022    2:00 PM 04/25/2022    2:48 PM 05/29/2022    1:34 PM 06/26/2022    1:30 PM  Fall Risk  Patient Fall Risk Level High fall risk High fall risk High fall risk High fall risk High fall risk   Functional Status Survey:    Vitals:   07/05/22 1612  BP: 130/77  Pulse: 98  Resp: 18  Temp: 97.9 F (36.6 C)  SpO2: 98%  Weight: 134 lb 9.6 oz (61.1 kg)   Body mass index is 21.73 kg/m. Physical Exam Vitals reviewed.  Constitutional:      Appearance: She is underweight.  Eyes:     General:        Right eye: No discharge.        Left eye: No discharge.  Cardiovascular:     Rate and Rhythm: Normal rate and regular rhythm.  Pulses: Normal pulses.     Heart sounds: Normal heart sounds.  Pulmonary:     Effort: Pulmonary effort is normal. No respiratory distress.     Breath sounds: Normal breath sounds. No wheezing.  Abdominal:     General: Bowel sounds are normal. There is no distension.     Palpations: Abdomen is soft.     Tenderness: There is no abdominal tenderness.  Musculoskeletal:     Right shoulder: No swelling, deformity, tenderness or crepitus. Decreased range of motion. Normal strength.     Left shoulder: Tenderness present. No swelling, deformity or crepitus. Decreased range of motion. Normal strength.     Cervical back: Neck supple.     Right lower leg: Edema present.     Left lower leg: Edema present.     Comments: Non pitting, tenderness over left AC  Skin:    General: Skin is warm and dry.     Capillary Refill: Capillary refill takes less than 2 seconds.  Neurological:     General: No focal deficit present.     Mental Status: She is alert and oriented to person, place, and time.     Motor:  Weakness present.     Gait: Gait abnormal.     Comments: walker  Psychiatric:        Mood and Affect: Mood normal.        Behavior: Behavior normal.     Labs reviewed: Recent Labs    04/25/22 1420 05/29/22 1306 06/26/22 1253  NA 140 138 139  K 5.1 5.5* 4.2  CL 108 107 107  CO2 '24 22 22  '$ GLUCOSE 101* 99 101*  BUN 51* 61* 50*  CREATININE 2.72* 2.68* 2.77*  CALCIUM 8.9 8.9 8.6*   Recent Labs    04/25/22 1420 05/29/22 1306 06/26/22 1253  AST 16 12* 12*  ALT '9 8 9  '$ ALKPHOS 51 54 69  BILITOT 0.4 0.3 0.5  PROT 5.6* 5.9* 5.8*  ALBUMIN 3.0* 3.0* 2.8*   Recent Labs    04/25/22 1420 05/29/22 1306 06/26/22 1253  WBC 12.8* 4.2 6.9  NEUTROABS 7.4 1.4* 3.2  HGB 11.2* 8.6* 10.1*  HCT 34.6* 26.0* 29.9*  MCV 98.6 100.4* 96.8  PLT 151 142* 160   Lab Results  Component Value Date   TSH 10.52 (A) 01/05/2022   No results found for: "HGBA1C" No results found for: "CHOL", "HDL", "LDLCALC", "LDLDIRECT", "TRIG", "CHOLHDL"  Significant Diagnostic Results in last 30 days:  No results found.  Assessment/Plan 1. Acute pain of left shoulder - woke up with pain - tenderness over AC, limited ROM, pain " tightness"  - cont tylenol, tramadol, gabapentin - start lidocaine patch 4%- apply to left shoulder daily x 14 days - start robaxin 250 mg po TID PRN - consider left shoulder xray if pain persists  2. Lymphoma, marginal zone, lymph nodes of multiple sites [facility]) - followed by oncology - 09/18 received 2 units PRBC  - cont Calquence and Famciclovir  3. Acquired hypothyroidism - TSH stable - cont levothyroxine  4. Bilateral leg edema - cont torsemide prn  5. CKD (chronic kidney disease) stage 4, GFR 15-29 ml/min (HCC) - avoid nephrotoxic drugs like NSAIDS and dose adjust medications to be renally excreted - encourage hydration with water   6. Gastroesophageal reflux disease, unspecified whether esophagitis present - hgb stable - cont Protonix  7. Anemia due to stage  4 chronic kidney disease ([place]) - see above  8. Trigeminal neuralgia - cont gabapentin  Family/ staff Communication: plan discussed with patient and nurse  Labs/tests ordered:  none

## 2022-07-18 ENCOUNTER — Inpatient Hospital Stay: Payer: Medicare Other

## 2022-07-18 ENCOUNTER — Inpatient Hospital Stay (HOSPITAL_BASED_OUTPATIENT_CLINIC_OR_DEPARTMENT_OTHER): Payer: Medicare Other | Admitting: Family

## 2022-07-18 ENCOUNTER — Inpatient Hospital Stay: Payer: Medicare Other | Attending: Hematology & Oncology

## 2022-07-18 ENCOUNTER — Encounter: Payer: Self-pay | Admitting: Family

## 2022-07-18 VITALS — BP 98/50 | HR 120 | Temp 98.0°F | Resp 18 | Wt 127.0 lb

## 2022-07-18 DIAGNOSIS — N1832 Chronic kidney disease, stage 3b: Secondary | ICD-10-CM

## 2022-07-18 DIAGNOSIS — N183 Chronic kidney disease, stage 3 unspecified: Secondary | ICD-10-CM | POA: Insufficient documentation

## 2022-07-18 DIAGNOSIS — C8588 Other specified types of non-Hodgkin lymphoma, lymph nodes of multiple sites: Secondary | ICD-10-CM

## 2022-07-18 DIAGNOSIS — D631 Anemia in chronic kidney disease: Secondary | ICD-10-CM | POA: Insufficient documentation

## 2022-07-18 DIAGNOSIS — D509 Iron deficiency anemia, unspecified: Secondary | ICD-10-CM | POA: Diagnosis not present

## 2022-07-18 DIAGNOSIS — D649 Anemia, unspecified: Secondary | ICD-10-CM

## 2022-07-18 DIAGNOSIS — Z8572 Personal history of non-Hodgkin lymphomas: Secondary | ICD-10-CM

## 2022-07-18 DIAGNOSIS — E8809 Other disorders of plasma-protein metabolism, not elsewhere classified: Secondary | ICD-10-CM

## 2022-07-18 LAB — CBC WITH DIFFERENTIAL (CANCER CENTER ONLY)
Abs Immature Granulocytes: 0.2 10*3/uL — ABNORMAL HIGH (ref 0.00–0.07)
Band Neutrophils: 3 %
Basophils Absolute: 0 10*3/uL (ref 0.0–0.1)
Basophils Relative: 0 %
Eosinophils Absolute: 0.2 10*3/uL (ref 0.0–0.5)
Eosinophils Relative: 2 %
HCT: 31.8 % — ABNORMAL LOW (ref 36.0–46.0)
Hemoglobin: 10.3 g/dL — ABNORMAL LOW (ref 12.0–15.0)
Lymphocytes Relative: 59 %
Lymphs Abs: 7.3 10*3/uL — ABNORMAL HIGH (ref 0.7–4.0)
MCH: 33.7 pg (ref 26.0–34.0)
MCHC: 32.4 g/dL (ref 30.0–36.0)
MCV: 103.9 fL — ABNORMAL HIGH (ref 80.0–100.0)
Metamyelocytes Relative: 2 %
Monocytes Absolute: 0.4 10*3/uL (ref 0.1–1.0)
Monocytes Relative: 3 %
Neutro Abs: 4.2 10*3/uL (ref 1.7–7.7)
Neutrophils Relative %: 31 %
Platelet Count: 149 10*3/uL — ABNORMAL LOW (ref 150–400)
RBC: 3.06 MIL/uL — ABNORMAL LOW (ref 3.87–5.11)
RDW: 18.8 % — ABNORMAL HIGH (ref 11.5–15.5)
Smear Review: NORMAL
WBC Count: 12.4 10*3/uL — ABNORMAL HIGH (ref 4.0–10.5)
nRBC: 0 % (ref 0.0–0.2)

## 2022-07-18 LAB — CMP (CANCER CENTER ONLY)
ALT: 8 U/L (ref 0–44)
AST: 16 U/L (ref 15–41)
Albumin: 2.8 g/dL — ABNORMAL LOW (ref 3.5–5.0)
Alkaline Phosphatase: 90 U/L (ref 38–126)
Anion gap: 11 (ref 5–15)
BUN: 48 mg/dL — ABNORMAL HIGH (ref 8–23)
CO2: 23 mmol/L (ref 22–32)
Calcium: 8.9 mg/dL (ref 8.9–10.3)
Chloride: 108 mmol/L (ref 98–111)
Creatinine: 2.67 mg/dL — ABNORMAL HIGH (ref 0.44–1.00)
GFR, Estimated: 16 mL/min — ABNORMAL LOW (ref >60)
Glucose, Bld: 97 mg/dL (ref 70–99)
Potassium: 4.8 mmol/L (ref 3.5–5.1)
Sodium: 142 mmol/L (ref 135–145)
Total Bilirubin: 0.3 mg/dL (ref 0.3–1.2)
Total Protein: 6.2 g/dL — ABNORMAL LOW (ref 6.5–8.1)

## 2022-07-18 LAB — RETICULOCYTES
Immature Retic Fract: 12.4 % (ref 2.3–15.9)
RBC.: 3.05 MIL/uL — ABNORMAL LOW (ref 3.87–5.11)
Retic Count, Absolute: 77.2 10*3/uL (ref 19.0–186.0)
Retic Ct Pct: 2.5 % (ref 0.4–3.1)

## 2022-07-18 LAB — LACTATE DEHYDROGENASE: LDH: 535 U/L — ABNORMAL HIGH (ref 98–192)

## 2022-07-18 LAB — SAMPLE TO BLOOD BANK

## 2022-07-18 LAB — FERRITIN: Ferritin: 406 ng/mL — ABNORMAL HIGH (ref 11–307)

## 2022-07-18 MED ORDER — EPOETIN ALFA-EPBX 40000 UNIT/ML IJ SOLN
40000.0000 [IU] | Freq: Once | INTRAMUSCULAR | Status: AC
  Administered 2022-07-18: 40000 [IU] via SUBCUTANEOUS
  Filled 2022-07-18: qty 1

## 2022-07-18 NOTE — Patient Instructions (Signed)

## 2022-07-18 NOTE — Progress Notes (Signed)
Hematology and Oncology Follow Up Visit  Joy Lynch [id number] 01-01-1926 88 y.o. 07/18/2022   Principle Diagnosis:  Marginal Zone lymphoma -- relapsed Anemia secondary to chronic kidney disease, stage III   Past Therapy: Umbralisib 400 mg po q day, started on 10/22/2020 - discontinued by manufacturer    Current Therapy:        Calquence 100 mg PO daily, started 05/25/2021 Retacrit SQ as indicated for Hgb < 11    Interim History:  Joy Lynch is here today with her nephew for follow-up and injection.  Hgb is 10.3, MCV 103, platelets 149 and WBC count 12.4.  She has fatigue at times and mild SOB with over exertion. She takes a break to rest when needed.  She has dry skin on her legs and scratch marks where she has been itching. She will trying a thick absorbant cream on her legs twice a day is possible. She has an in home helper that can assist.  No fever, chills, n/v, cough, dizziness, chest pain, palpitations, abdominal pain or changes in bowel or bladder habits.  She has minimal puffiness in her feet and ankles, unchanged from baseline.  No falls or syncope reported.  Appetite and hydration are good per patient. Her weight is 126 lbs which she states is her norm and she is happy with that number.   ECOG Performance Status: 1 - Symptomatic but completely ambulatory  Medications:  Allergies as of 07/18/2022       Reactions   Penicillins Hives   Tegretol [carbamazepine] Hives, Other (See Comments)   Turned purple from neck down and drowsy   Lyrica [pregabalin] Other (See Comments)   Drowsy        Medication List        Accurate as of July 18, 2022  1:31 PM. If you have any questions, ask your nurse or doctor.          acetaminophen 500 MG tablet Commonly known as: TYLENOL Take 500 mg by mouth every 6 (six) hours as needed.   acetaminophen 500 MG tablet Commonly known as: TYLENOL Take 500 mg by mouth 3 (three) times daily.   aspirin 81 MG chewable  tablet Chew 81 mg by mouth daily.   Calquence 100 MG tablet Generic drug: acalabrutinib maleate Take 1 tablet (100 mg total) by mouth daily.   cyanocobalamin 1000 MCG tablet Commonly known as: VITAMIN B12 Take 1 tablet (1,000 mcg total) by mouth daily.   famciclovir 125 MG tablet Commonly known as: FAMVIR Take 250 mg by mouth daily. Two tablets   gabapentin 600 MG tablet Commonly known as: NEURONTIN Take 600 mg by mouth 3 (three) times daily.   lactose free nutrition Liqd Take 237 mLs by mouth daily.   levothyroxine 112 MCG tablet Commonly known as: SYNTHROID Take 112 mcg by mouth daily before breakfast.   lidocaine 4 % Place 1 patch onto the skin daily for 14 days.   methocarbamol 500 MG tablet Commonly known as: ROBAXIN Take 0.5 tablets (250 mg total) by mouth 3 (three) times daily as needed for muscle spasms.   Multi-Vitamins Tabs Take 1 tablet by mouth daily.   pantoprazole 40 MG tablet Commonly known as: PROTONIX Take 40 mg by mouth 2 (two) times daily.   polyvinyl alcohol 1.4 % ophthalmic solution Commonly known as: LIQUIFILM TEARS Place 1 drop into both eyes 4 (four) times daily as needed.   Pro-Stat Liqd Take 30 mLs by mouth daily.   simethicone 125 MG chewable  tablet Commonly known as: MYLICON Chew 706 mg by mouth 2 (two) times daily as needed for flatulence.   torsemide 10 MG tablet Commonly known as: DEMADEX 10 mg daily as needed.   traMADol 50 MG tablet Commonly known as: ULTRAM Take 25 mg by mouth 3 (three) times daily.   white petrolatum ointment Apply topically 2 (two) times daily.   zinc oxide 20 % ointment Apply 1 application. topically as needed for irritation. To buttocks after every incontinent episode and as needed for redness. May keep at bedside.        Allergies:  Allergies  Allergen Reactions   Penicillins Hives   Tegretol [Carbamazepine] Hives and Other (See Comments)    Turned purple from neck down and drowsy     Lyrica [Pregabalin] Other (See Comments)    Drowsy    Past Medical History, Surgical history, Social history, and Family History were reviewed and updated.  Review of Systems: All other 10 point review of systems is negative.   Physical Exam:  vitals were not taken for this visit.   Wt Readings from Last 3 Encounters:  07/05/22 134 lb 9.6 oz (61.1 kg)  06/26/22 125 lb (56.7 kg)  05/09/22 127 lb (57.6 kg)    Ocular: Sclerae unicteric, pupils equal, round and reactive to light Ear-nose-throat: Oropharynx clear, dentition fair Lymphatic: No cervical or supraclavicular adenopathy Lungs no rales or rhonchi, good excursion bilaterally Heart regular rate and rhythm, no murmur appreciated Abd soft, nontender, positive bowel sounds MSK no focal spinal tenderness, no joint edema Neuro: non-focal, well-oriented, appropriate affect Breasts: Deferred   Lab Results  Component Value Date   WBC 6.9 06/26/2022   HGB 10.1 (L) 06/26/2022   HCT 29.9 (L) 06/26/2022   MCV 96.8 06/26/2022   PLT 160 06/26/2022   Lab Results  Component Value Date   FERRITIN 824 (H) 06/26/2022   IRON 59 06/26/2022   TIBC 155 (L) 06/26/2022   UIBC 96 (L) 06/26/2022   IRONPCTSAT 38 (H) 06/26/2022   Lab Results  Component Value Date   RETICCTPCT 1.2 06/26/2022   RBC 3.10 (L) 06/26/2022   Lab Results  Component Value Date   KPAFRELGTCHN 20.9 (H) 11/16/2021   LAMBDASER 19.0 11/16/2021   KAPLAMBRATIO 1.10 11/16/2021   Lab Results  Component Value Date   IGGSERUM 168 (L) 08/21/2021   IGA 221 08/21/2021   IGMSERUM 1,672 (H) 08/21/2021   No results found for: "TOTALPROTELP", "ALBUMINELP", "A1GS", "A2GS", "BETS", "BETA2SER", "GAMS", "MSPIKE", "SPEI"   Chemistry      Component Value Date/Time   NA 139 06/26/2022 1253   NA 137 11/10/2020 0000   NA 138 08/16/2017 1256   K 4.2 06/26/2022 1253   K 4.3 08/16/2017 1256   CL 107 06/26/2022 1253   CO2 22 06/26/2022 1253   CO2 25 08/16/2017 1256   BUN 50  (H) 06/26/2022 1253   BUN 16 11/10/2020 0000   BUN 19.6 08/16/2017 1256   CREATININE 2.77 (H) 06/26/2022 1253   CREATININE 0.7 08/16/2017 1256   GLU 78 11/10/2020 0000      Component Value Date/Time   CALCIUM 8.6 (L) 06/26/2022 1253   CALCIUM 9.2 08/16/2017 1256   ALKPHOS 69 06/26/2022 1253   ALKPHOS 42 08/16/2017 1256   AST 12 (L) 06/26/2022 1253   AST 18 08/16/2017 1256   ALT 9 06/26/2022 1253   ALT 11 08/16/2017 1256   BILITOT 0.5 06/26/2022 1253   BILITOT 0.87 08/16/2017 1256  Impression and Plan: Ms. Lape is a very pleasant 88 yo caucasian female with relapsed marginal zone lymphoma and multifactorial anemia.  ESA given for Hgb 10.3.  Iron studies pending.  Lab and injection every 3 weeks, follow-up in 6 weeks.   Lottie Dawson, NP 10/18/20231:31 PM

## 2022-07-19 LAB — IRON AND IRON BINDING CAPACITY (CC-WL,HP ONLY)
Iron: 46 ug/dL (ref 28–170)
Saturation Ratios: 25 % (ref 10.4–31.8)
TIBC: 182 ug/dL — ABNORMAL LOW (ref 250–450)
UIBC: 136 ug/dL — ABNORMAL LOW (ref 148–442)

## 2022-07-26 ENCOUNTER — Other Ambulatory Visit: Payer: Self-pay | Admitting: *Deleted

## 2022-07-26 MED ORDER — CALQUENCE 100 MG PO TABS: 100.0000 mg | ORAL_TABLET | Freq: Every day | ORAL | 11 refills | Status: DC

## 2022-08-03 ENCOUNTER — Other Ambulatory Visit: Payer: Self-pay | Admitting: Orthopedic Surgery

## 2022-08-03 DIAGNOSIS — E039 Hypothyroidism, unspecified: Secondary | ICD-10-CM

## 2022-08-03 LAB — TSH: TSH: 0.15 — AB (ref 0.41–5.90)

## 2022-08-03 MED ORDER — LEVOTHYROXINE SODIUM 100 MCG PO TABS: 100.0000 ug | ORAL_TABLET | Freq: Every day | ORAL | 3 refills | Status: DC

## 2022-08-03 NOTE — Progress Notes (Signed)
TSH 0.15. Will reduce Levothyroxine to 100 mcg. Repeat TSH in 8 weeks. Patient agreeable to medication adjustment.

## 2022-08-08 ENCOUNTER — Inpatient Hospital Stay: Payer: Medicare Other | Attending: Hematology & Oncology

## 2022-08-08 ENCOUNTER — Inpatient Hospital Stay: Payer: Medicare Other

## 2022-08-08 VITALS — BP 119/52 | HR 115 | Temp 97.6°F | Resp 18

## 2022-08-08 DIAGNOSIS — N183 Chronic kidney disease, stage 3 unspecified: Secondary | ICD-10-CM | POA: Insufficient documentation

## 2022-08-08 DIAGNOSIS — C8588 Other specified types of non-Hodgkin lymphoma, lymph nodes of multiple sites: Secondary | ICD-10-CM

## 2022-08-08 DIAGNOSIS — N1832 Chronic kidney disease, stage 3b: Secondary | ICD-10-CM

## 2022-08-08 DIAGNOSIS — D509 Iron deficiency anemia, unspecified: Secondary | ICD-10-CM

## 2022-08-08 DIAGNOSIS — E8809 Other disorders of plasma-protein metabolism, not elsewhere classified: Secondary | ICD-10-CM

## 2022-08-08 DIAGNOSIS — D649 Anemia, unspecified: Secondary | ICD-10-CM

## 2022-08-08 DIAGNOSIS — D631 Anemia in chronic kidney disease: Secondary | ICD-10-CM

## 2022-08-08 DIAGNOSIS — Z8572 Personal history of non-Hodgkin lymphomas: Secondary | ICD-10-CM

## 2022-08-08 LAB — CMP (CANCER CENTER ONLY)
ALT: 8 U/L (ref 0–44)
AST: 14 U/L — ABNORMAL LOW (ref 15–41)
Albumin: 2.6 g/dL — ABNORMAL LOW (ref 3.5–5.0)
Alkaline Phosphatase: 81 U/L (ref 38–126)
Anion gap: 11 (ref 5–15)
BUN: 56 mg/dL — ABNORMAL HIGH (ref 8–23)
CO2: 22 mmol/L (ref 22–32)
Calcium: 9.4 mg/dL (ref 8.9–10.3)
Chloride: 106 mmol/L (ref 98–111)
Creatinine: 2.77 mg/dL — ABNORMAL HIGH (ref 0.44–1.00)
GFR, Estimated: 15 mL/min — ABNORMAL LOW (ref >60)
Glucose, Bld: 124 mg/dL — ABNORMAL HIGH (ref 70–99)
Potassium: 4.8 mmol/L (ref 3.5–5.1)
Sodium: 139 mmol/L (ref 135–145)
Total Bilirubin: 0.5 mg/dL (ref 0.3–1.2)
Total Protein: 7.2 g/dL (ref 6.5–8.1)

## 2022-08-08 LAB — CBC WITH DIFFERENTIAL (CANCER CENTER ONLY)
Abs Immature Granulocytes: 0.84 10*3/uL — ABNORMAL HIGH (ref 0.00–0.07)
Basophils Absolute: 0.1 10*3/uL (ref 0.0–0.1)
Basophils Relative: 0 %
Eosinophils Absolute: 0.2 10*3/uL (ref 0.0–0.5)
Eosinophils Relative: 1 %
HCT: 32.6 % — ABNORMAL LOW (ref 36.0–46.0)
Hemoglobin: 10.4 g/dL — ABNORMAL LOW (ref 12.0–15.0)
Immature Granulocytes: 5 %
Lymphocytes Relative: 54 %
Lymphs Abs: 9.4 10*3/uL — ABNORMAL HIGH (ref 0.7–4.0)
MCH: 34.1 pg — ABNORMAL HIGH (ref 26.0–34.0)
MCHC: 31.9 g/dL (ref 30.0–36.0)
MCV: 106.9 fL — ABNORMAL HIGH (ref 80.0–100.0)
Monocytes Absolute: 2.1 10*3/uL — ABNORMAL HIGH (ref 0.1–1.0)
Monocytes Relative: 12 %
Neutro Abs: 5 10*3/uL (ref 1.7–7.7)
Neutrophils Relative %: 28 %
Platelet Count: 219 10*3/uL (ref 150–400)
RBC: 3.05 MIL/uL — ABNORMAL LOW (ref 3.87–5.11)
RDW: 17 % — ABNORMAL HIGH (ref 11.5–15.5)
Smear Review: NORMAL
WBC Count: 17.5 10*3/uL — ABNORMAL HIGH (ref 4.0–10.5)
nRBC: 0 % (ref 0.0–0.2)

## 2022-08-08 LAB — SAMPLE TO BLOOD BANK

## 2022-08-08 MED ORDER — EPOETIN ALFA-EPBX 40000 UNIT/ML IJ SOLN
40000.0000 [IU] | Freq: Once | INTRAMUSCULAR | Status: AC
  Administered 2022-08-08: 40000 [IU] via SUBCUTANEOUS
  Filled 2022-08-08: qty 1

## 2022-08-08 NOTE — Patient Instructions (Signed)

## 2022-08-16 ENCOUNTER — Non-Acute Institutional Stay: Payer: Medicare Other | Admitting: Internal Medicine

## 2022-08-16 ENCOUNTER — Encounter: Payer: Self-pay | Admitting: Internal Medicine

## 2022-08-16 DIAGNOSIS — G500 Trigeminal neuralgia: Secondary | ICD-10-CM

## 2022-08-16 DIAGNOSIS — C8588 Other specified types of non-Hodgkin lymphoma, lymph nodes of multiple sites: Secondary | ICD-10-CM

## 2022-08-16 DIAGNOSIS — E039 Hypothyroidism, unspecified: Secondary | ICD-10-CM

## 2022-08-16 DIAGNOSIS — N184 Chronic kidney disease, stage 4 (severe): Secondary | ICD-10-CM

## 2022-08-16 DIAGNOSIS — K219 Gastro-esophageal reflux disease without esophagitis: Secondary | ICD-10-CM

## 2022-08-16 DIAGNOSIS — R197 Diarrhea, unspecified: Secondary | ICD-10-CM

## 2022-08-16 DIAGNOSIS — M545 Low back pain, unspecified: Secondary | ICD-10-CM

## 2022-08-16 DIAGNOSIS — D631 Anemia in chronic kidney disease: Secondary | ICD-10-CM

## 2022-08-16 DIAGNOSIS — G8929 Other chronic pain: Secondary | ICD-10-CM

## 2022-08-16 NOTE — Progress Notes (Unsigned)
Location:  [facility] Room Number: 09/A Place of Service:  ALF [phone]) Provider:  Virgie Dad, MD   Virgie Dad, MD  Patient Care Team: Virgie Dad, MD as PCP - General (Internal Medicine)  Extended Emergency Contact Information Primary Emergency Contact: Genia Plants Address: [address]          [place], [place] [address] Johnnette Litter of [place] Phone: [phone] Relation: Alanson Puls Secondary Emergency Contact: [name] Address: [address]          [place],  [address] Johnnette Litter of [place] Phone: [phone] Mobile Phone: [phone] Relation: Nephew  Code Status:  Full Code Goals of care: Advanced Directive information    08/16/2022   10:40 AM  Advanced Directives  Does Patient Have a Medical Advance Directive? Yes  Type of Paramedic of [place];Living will  Does patient want to make changes to medical advance directive? No - Patient declined  Copy of [place] in Chart? Yes - validated most recent copy scanned in chart (See row information)     Chief Complaint  Patient presents with   Medical Management of Chronic Issues    Routine Visit   Quality Metric Gaps    Discussed the need for Shingles vaccine and AWV last one was 05/03/2021    HPI:  Pt is a 88 y.o. female seen today for medical management of chronic diseases.    Patient lives in [place]  Patient has a history of Relapsed Marginal Zone  lymphoma followed by oncology  osteoporosis,   hypothyroidism,  trigeminal neuralgia and migraines   H/o  frontoparietal subarachnoid hemorrhage and Rib fractures LE edema mild   Patient stays on Calquence per Oncology for her Lymphoma She is also on EPO injections now for symptomatic Anemia  Her main complain today was frequent Stools Started 3 weeks ago She goes to the bathroom 8-10 times a day sometimes at night also. Stool is mostly loose not watery. No Abdominal  pain or discomfort No Fever or chills No Nausea Eating fair Walks with her walker Feels tired easily.  No Falls Independent in her ADLS Weight stable. 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[place])    Per [facility]   History of B-cell lymphoma 11/02/2015   History of falling    [place]   Hypothyroidism, unspecified    per [facility]   Lymphedema, not elsewhere classified    Per [facility]   Migraines    Muscle weakness (generalized)    [place]   Neuralgia and neuritis, unspecified    [place]   Nontraumatic subarachnoid hemorrhage, unspecified ([place])    per [facility]   Osteoporosis    Pain in right shoulder    Tachycardia, unspecified    [place]   Thyroid disease    Trigeminal neuralgia of left side of face    Unsteadiness on feet  [facility]   Past Surgical History:  Procedure Laterality Date   gamma knife     for trigeminal neuralgia   SHOULDER SURGERY     Right shoulder replacement   TONSILLECTOMY AND ADENOIDECTOMY     WISDOM TOOTH EXTRACTION      Allergies  Allergen Reactions   Penicillins Hives   Tegretol [Carbamazepine] Hives and Other (See Comments)    Turned purple from neck down and drowsy    Lyrica [Pregabalin] Other (See Comments)    Drowsy    Outpatient Encounter Medications as of 08/16/2022  Medication Sig   acalabrutinib maleate (CALQUENCE) 100 MG tablet Take 1 tablet (100 mg total) by mouth daily.   acetaminophen (TYLENOL) 500 MG tablet Take 500 mg by mouth every 6 (six) hours as needed.   acetaminophen  (TYLENOL) 500 MG tablet Take 500 mg by mouth 3 (three) times daily.   Amino Acids-Protein Hydrolys (PRO-STAT) LIQD Take 30 mLs by mouth daily.   aspirin 81 MG chewable tablet Chew 81 mg by mouth daily.   famciclovir (FAMVIR) 125 MG tablet Take 250 mg by mouth daily. Two tablets   gabapentin (NEURONTIN) 600 MG tablet Take 600 mg by mouth 3 (three) times daily.   lactose free nutrition (BOOST) LIQD Take 237 mLs by mouth daily.   levothyroxine (SYNTHROID) 100 MCG tablet Take 1 tablet (100 mcg total) by mouth daily.   methocarbamol (ROBAXIN) 500 MG tablet Take 0.5 tablets (250 mg total) by mouth 3 (three) times daily as needed for muscle spasms.   Multiple Vitamin (MULTI-VITAMINS) TABS Take 1 tablet by mouth daily.   pantoprazole (PROTONIX) 40 MG tablet Take 40 mg by mouth 2 (two) times daily.   polyvinyl alcohol (LIQUIFILM TEARS) 1.4 % ophthalmic solution Place 1 drop into both eyes 4 (four) times daily as needed.   simethicone (MYLICON) 947 MG chewable tablet Chew 125 mg by mouth 2 (two) times daily as needed for flatulence.   torsemide (DEMADEX) 10 MG tablet 10 mg daily as needed.   traMADol (ULTRAM) 50 MG tablet Take 25 mg by mouth 3 (three) times daily.   vitamin B-12 (CYANOCOBALAMIN) 1000 MCG tablet Take 1 tablet (1,000 mcg total) by mouth daily.   white petrolatum ointment Apply topically 2 (two) times daily.   zinc oxide 20 % ointment Apply 1 application. topically as needed for irritation. To buttocks after every incontinent episode and as needed for redness. May keep at bedside.   [DISCONTINUED] tiZANidine (ZANAFLEX) 2 MG tablet Take 2 mg by mouth 2 (two) times daily.   Facility-Administered Encounter Medications as of 08/16/2022  Medication   epoetin alfa-epbx (RETACRIT) injection 40,000 Units    Review of Systems  Constitutional:  Positive for activity change. Negative for appetite change.  HENT: Negative.    Respiratory:  Negative for cough and shortness of breath.    Cardiovascular:  Negative for leg swelling.  Gastrointestinal:  Positive for diarrhea. Negative for constipation.  Genitourinary: Negative.   Musculoskeletal:  Negative for arthralgias, gait problem and myalgias.  Skin: Negative.   Neurological:  Positive for weakness. Negative for dizziness.  Psychiatric/Behavioral:  Negative for confusion, dysphoric mood and sleep disturbance.     Immunization History  Administered Date(s) Administered   Fluad Quad(high Dose 65+) 05/28/2019, 07/25/2022   Influenza Split 07/12/2016, 08/13/2017, 06/02/2019, 09/09/2020   Influenza, High Dose Seasonal PF 06/11/2017, 06/28/2018   Influenza-Unspecified 06/21/2020, 07/25/2021   Moderna SARS-COV2 Booster Vaccination 03/08/2021   Moderna Sars-Covid-2 Vaccination 10/05/2019, 11/02/2019, 08/15/2020   PFIZER(Purple  Top)SARS-COV-2 Vaccination 06/21/2021   Pneumococcal Conjugate-13 11/22/2014   Pneumococcal Polysaccharide-23 04/29/2009, 08/13/2017   Td 01/24/2013   Tdap 12/28/2017, 07/01/2018, 12/13/2020   Zoster Recombinat (Shingrix) 07/01/2018   Zoster, Live 11/12/2009, 07/01/2018   Zoster, Unspecified 12/17/2005   Pertinent  Health Maintenance Due  Topic Date Due   INFLUENZA VACCINE  Completed   DEXA SCAN  Completed      03/26/2022    2:00 PM 04/25/2022    2:48 PM 05/29/2022    1:34 PM 06/26/2022    1:30 PM 07/18/2022    1:38 PM  Fall Risk  Patient Fall Risk Level High fall risk High fall risk High fall risk High fall risk High fall risk   Functional Status Survey:    Vitals:   08/16/22 1024  BP: 131/75  Pulse: (!) 102  Resp: 20  Temp: (!) 97 F (36.1 C)  TempSrc: Temporal  SpO2: 96%  Weight: 130 lb (59 kg)  Height: '5\' 6"'$  (1.676 m)   Body mass index is 20.98 kg/m. Physical Exam Vitals reviewed.  Constitutional:      Appearance: Normal appearance.  HENT:     Head: Normocephalic.     Nose: Nose normal.     Mouth/Throat:     Mouth: Mucous membranes are moist.     Pharynx:  Oropharynx is clear.  Eyes:     Pupils: Pupils are equal, round, and reactive to light.  Cardiovascular:     Rate and Rhythm: Normal rate and regular rhythm.     Pulses: Normal pulses.     Heart sounds: Normal heart sounds. No murmur heard. Pulmonary:     Effort: Pulmonary effort is normal.     Breath sounds: Normal breath sounds.  Abdominal:     General: Abdomen is flat. Bowel sounds are normal.     Palpations: Abdomen is soft.  Musculoskeletal:        General: No swelling.     Cervical back: Neck supple.  Skin:    General: Skin is warm.  Neurological:     General: No focal deficit present.     Mental Status: She is alert and oriented to person, place, and time.  Psychiatric:        Mood and Affect: Mood normal.        Thought Content: Thought content normal.     Labs reviewed: Recent Labs    06/26/22 1253 07/18/22 1317 08/08/22 0956  NA 139 142 139  K 4.2 4.8 4.8  CL 107 108 106  CO2 '22 23 22  '$ GLUCOSE 101* 97 124*  BUN 50* 48* 56*  CREATININE 2.77* 2.67* 2.77*  CALCIUM 8.6* 8.9 9.4   Recent Labs    06/26/22 1253 07/18/22 1317 08/08/22 0956  AST 12* 16 14*  ALT '9 8 8  '$ ALKPHOS 69 90 81  BILITOT 0.5 0.3 0.5  PROT 5.8* 6.2* 7.2  ALBUMIN 2.8* 2.8* 2.6*   Recent Labs    06/26/22 1253 07/18/22 1317 08/08/22 0956  WBC 6.9 12.4* 17.5*  NEUTROABS 3.2 4.2 5.0  HGB 10.1* 10.3* 10.4*  HCT 29.9* 31.8* 32.6*  MCV 96.8 103.9* 106.9*  PLT 160 149* 219   Lab Results  Component Value Date   TSH 10.52 (A) 01/05/2022   No results found for: "HGBA1C" No results found for: "CHOL", "HDL", "LDLCALC", "LDLDIRECT", "TRIG", "CHOLHDL"  Significant Diagnostic Results in last 30 days:  No results found.  Assessment/Plan 1. Diarrhea, unspecified type Check Abdominal Xray to rule out Constipation  Also Check Stool for C Diff and GI pathogen Leucocyte Will use Imodium as needed 2. Acquired hypothyroidism Last TSH in facility was 0.15 Dose reduced Repeat TSH  pending  3. Lymphoma, marginal zone, lymph nodes of multiple sites ([place]) On Calquence  4. CKD (chronic kidney disease) stage 4, GFR 15-29 ml/min (HCC) Staying stable  5. Anemia due to stage 4 chronic kidney disease (HCC) On EPO  6. Trigeminal neuralgia High doses of Neurontin Did not tolerate taper  7. Chronic low back pain without sciatica, unspecified back pain laterality On Neurontin and Robaxin and tramadol  8. Gastroesophageal reflux disease, unspecified whether esophagitis present On Protonix    Family/ staff Communication:   Labs/tests ordered:

## 2022-08-31 ENCOUNTER — Inpatient Hospital Stay: Payer: Medicare Other

## 2022-08-31 ENCOUNTER — Encounter: Payer: Self-pay | Admitting: Orthopedic Surgery

## 2022-08-31 ENCOUNTER — Inpatient Hospital Stay: Payer: Medicare Other | Attending: Hematology & Oncology

## 2022-08-31 ENCOUNTER — Non-Acute Institutional Stay (INDEPENDENT_AMBULATORY_CARE_PROVIDER_SITE_OTHER): Payer: Medicare Other | Admitting: Orthopedic Surgery

## 2022-08-31 ENCOUNTER — Other Ambulatory Visit: Payer: Self-pay | Admitting: *Deleted

## 2022-08-31 VITALS — BP 87/42 | HR 110 | Temp 98.2°F | Resp 18

## 2022-08-31 DIAGNOSIS — N183 Chronic kidney disease, stage 3 unspecified: Secondary | ICD-10-CM | POA: Insufficient documentation

## 2022-08-31 DIAGNOSIS — D649 Anemia, unspecified: Secondary | ICD-10-CM

## 2022-08-31 DIAGNOSIS — Z8572 Personal history of non-Hodgkin lymphomas: Secondary | ICD-10-CM

## 2022-08-31 DIAGNOSIS — Z0000 Encounter for general adult medical examination without abnormal findings: Secondary | ICD-10-CM

## 2022-08-31 DIAGNOSIS — C8588 Other specified types of non-Hodgkin lymphoma, lymph nodes of multiple sites: Secondary | ICD-10-CM

## 2022-08-31 DIAGNOSIS — D631 Anemia in chronic kidney disease: Secondary | ICD-10-CM | POA: Diagnosis present

## 2022-08-31 DIAGNOSIS — N1832 Chronic kidney disease, stage 3b: Secondary | ICD-10-CM

## 2022-08-31 DIAGNOSIS — E8809 Other disorders of plasma-protein metabolism, not elsewhere classified: Secondary | ICD-10-CM

## 2022-08-31 LAB — CMP (CANCER CENTER ONLY)
ALT: 8 U/L (ref 0–44)
AST: 12 U/L — ABNORMAL LOW (ref 15–41)
Albumin: 2.5 g/dL — ABNORMAL LOW (ref 3.5–5.0)
Alkaline Phosphatase: 97 U/L (ref 38–126)
Anion gap: 12 (ref 5–15)
BUN: 65 mg/dL — ABNORMAL HIGH (ref 8–23)
CO2: 21 mmol/L — ABNORMAL LOW (ref 22–32)
Calcium: 9.1 mg/dL (ref 8.9–10.3)
Chloride: 100 mmol/L (ref 98–111)
Creatinine: 2.77 mg/dL — ABNORMAL HIGH (ref 0.44–1.00)
GFR, Estimated: 15 mL/min — ABNORMAL LOW (ref >60)
Glucose, Bld: 97 mg/dL (ref 70–99)
Potassium: 4.9 mmol/L (ref 3.5–5.1)
Sodium: 133 mmol/L — ABNORMAL LOW (ref 135–145)
Total Bilirubin: 0.3 mg/dL (ref 0.3–1.2)
Total Protein: 6.6 g/dL (ref 6.5–8.1)

## 2022-08-31 LAB — CBC WITH DIFFERENTIAL (CANCER CENTER ONLY)
Abs Immature Granulocytes: 0.09 10*3/uL — ABNORMAL HIGH (ref 0.00–0.07)
Basophils Absolute: 0.1 10*3/uL (ref 0.0–0.1)
Basophils Relative: 0 %
Eosinophils Absolute: 0.2 10*3/uL (ref 0.0–0.5)
Eosinophils Relative: 1 %
HCT: 32.8 % — ABNORMAL LOW (ref 36.0–46.0)
Hemoglobin: 10.4 g/dL — ABNORMAL LOW (ref 12.0–15.0)
Immature Granulocytes: 1 %
Lymphocytes Relative: 38 %
Lymphs Abs: 5.9 10*3/uL — ABNORMAL HIGH (ref 0.7–4.0)
MCH: 34.1 pg — ABNORMAL HIGH (ref 26.0–34.0)
MCHC: 31.7 g/dL (ref 30.0–36.0)
MCV: 107.5 fL — ABNORMAL HIGH (ref 80.0–100.0)
Monocytes Absolute: 0.7 10*3/uL (ref 0.1–1.0)
Monocytes Relative: 5 %
Neutro Abs: 8.5 10*3/uL — ABNORMAL HIGH (ref 1.7–7.7)
Neutrophils Relative %: 55 %
Platelet Count: 236 10*3/uL (ref 150–400)
RBC: 3.05 MIL/uL — ABNORMAL LOW (ref 3.87–5.11)
RDW: 15.3 % (ref 11.5–15.5)
Smear Review: NORMAL
WBC Count: 15.4 10*3/uL — ABNORMAL HIGH (ref 4.0–10.5)
nRBC: 0 % (ref 0.0–0.2)

## 2022-08-31 LAB — FERRITIN: Ferritin: 356 ng/mL — ABNORMAL HIGH (ref 11–307)

## 2022-08-31 MED ORDER — EPOETIN ALFA-EPBX 40000 UNIT/ML IJ SOLN
40000.0000 [IU] | Freq: Once | INTRAMUSCULAR | Status: AC
  Administered 2022-08-31: 40000 [IU] via SUBCUTANEOUS
  Filled 2022-08-31: qty 1

## 2022-08-31 NOTE — Progress Notes (Signed)
Subjective:   Joy Lynch is a 88 y.o. female who presents for Medicare Annual (Subsequent) preventive examination.  Place of Service: [facility]- assisted living Provider: Windell Moulding, AGNP-C   Review of Systems           Objective:    Today's Vitals   08/31/22 1021  BP: 134/69  Pulse: 94  Resp: 18  Temp: 97.7 F (36.5 C)  SpO2: 98%  Weight: 130 lb (59 kg)  Height: '5\' 6"'$  (1.676 m)   Body mass index is 20.98 kg/m.     08/16/2022   10:40 AM 07/18/2022    1:35 PM 06/26/2022    1:30 PM 05/29/2022    1:34 PM 05/09/2022   10:30 AM 04/25/2022    2:52 PM 03/26/2022    2:00 PM  Advanced Directives  Does Patient Have a Medical Advance Directive? Yes Yes Yes Yes Yes Yes Yes  Type of Paramedic of [place];Living will [place];Living will [place];Living will  [place];Living will [place];Living will [place];Living will  Does patient want to make changes to medical advance directive? No - Patient declined No - Patient declined   No - Patient declined    Copy of [place] in Chart? Yes - validated most recent copy scanned in chart (See row information) No - copy requested Yes - validated most recent copy scanned in chart (See row information) Yes - validated most recent copy scanned in chart (See row information) Yes - validated most recent copy scanned in chart (See row information) Yes - validated most recent copy scanned in chart (See row information) Yes - validated most recent copy scanned in chart (See row information)    Current Medications (verified) Outpatient Encounter Medications as of 08/31/2022  Medication Sig   acalabrutinib maleate (CALQUENCE) 100 MG tablet Take 1 tablet (100 mg total) by mouth daily.   acetaminophen (TYLENOL) 500 MG tablet Take 500 mg by mouth every 6 (six) hours as needed.   acetaminophen (TYLENOL) 500  MG tablet Take 500 mg by mouth 3 (three) times daily.   Amino Acids-Protein Hydrolys (PRO-STAT) LIQD Take 30 mLs by mouth daily.   aspirin 81 MG chewable tablet Chew 81 mg by mouth daily.   famciclovir (FAMVIR) 125 MG tablet Take 250 mg by mouth daily. Two tablets   gabapentin (NEURONTIN) 600 MG tablet Take 600 mg by mouth 3 (three) times daily.   lactose free nutrition (BOOST) LIQD Take 237 mLs by mouth daily.   levothyroxine (SYNTHROID) 100 MCG tablet Take 1 tablet (100 mcg total) by mouth daily.   methocarbamol (ROBAXIN) 500 MG tablet Take 0.5 tablets (250 mg total) by mouth 3 (three) times daily as needed for muscle spasms.   Multiple Vitamin (MULTI-VITAMINS) TABS Take 1 tablet by mouth daily.   pantoprazole (PROTONIX) 40 MG tablet Take 40 mg by mouth 2 (two) times daily.   polyvinyl alcohol (LIQUIFILM TEARS) 1.4 % ophthalmic solution Place 1 drop into both eyes 4 (four) times daily as needed.   simethicone (MYLICON) 237 MG chewable tablet Chew 125 mg by mouth 2 (two) times daily as needed for flatulence.   torsemide (DEMADEX) 10 MG tablet 10 mg daily as needed.   traMADol (ULTRAM) 50 MG tablet Take 25 mg by mouth 3 (three) times daily.   vitamin B-12 (CYANOCOBALAMIN) 1000 MCG tablet Take 1 tablet (1,000 mcg total) by mouth daily.  white petrolatum ointment Apply topically 2 (two) times daily.   zinc oxide 20 % ointment Apply 1 application. topically as needed for irritation. To buttocks after every incontinent episode and as needed for redness. May keep at bedside.   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[place])    Per [facility]   History of B-cell lymphoma 11/02/2015   History of falling    [place]   Hypothyroidism, unspecified    per [facility]   Lymphedema, not elsewhere classified    Per [facility]   Migraines    Muscle weakness (generalized)    [facility]   Neuralgia and neuritis, unspecified    [place]   Nontraumatic subarachnoid hemorrhage, unspecified ([place])    per [facility]   Osteoporosis    Pain in right shoulder    Tachycardia, unspecified    [place]   Thyroid disease    Trigeminal neuralgia of left side of face    Unsteadiness on feet    [facility]   Past Surgical History:  Procedure Laterality Date   gamma knife     for trigeminal neuralgia   SHOULDER SURGERY     Right shoulder replacement   TONSILLECTOMY AND ADENOIDECTOMY     WISDOM TOOTH EXTRACTION     Family History  Problem Relation Age of Onset   Cancer Father        unknown ca   Pneumonia Father    Cancer Brother        prostate ca   Stroke Brother    Heart attack Mother    Social History   Socioeconomic History   Marital status: Divorced    Spouse name: Not on file   Number of children: 0   Years of education: Masters   Highest education level: Not on file  Occupational History   Occupation: Retired  Tobacco Use   Smoking status: Former    Packs/day: 0.50    Years: 20.00    Total pack years: 10.00    Types: Cigarettes    Quit date: 10/02/1975    Years since quitting: 46.9   Smokeless tobacco: Never  Vaping Use   Vaping Use: Never used  Substance and Sexual Activity   Alcohol use: Yes    Alcohol/week: 2.0 standard drinks of alcohol    Types: 2 Glasses of wine per week   Drug use: No   Sexual activity: Never    Comment: divorced, no children, sister in law next of kin. Retired Actuary for [employer]  Other Topics Concern   Not on file   Social History Narrative   Lives at home alone.   Right-handed.   4 cups caffeine per day.   Social Determinants of Health   Financial Resource Strain: Low Risk  (05/03/2021)   Overall Financial Resource Strain (CARDIA)    Difficulty of Paying Living Expenses: Not hard at all  Food Insecurity: No Food Insecurity (05/03/2021)   Hunger  Vital Sign    Worried About Charity fundraiser in the Last Year: Never true    Ran Out of Food in the Last Year: Never true  Transportation Needs: No Transportation Needs (05/03/2021)   PRAPARE - Hydrologist (Medical): No    Lack of Transportation (Non-Medical): No  Physical Activity: Insufficiently Active (05/03/2021)   Exercise Vital Sign    Days of Exercise per Week: 3 days    Minutes of Exercise per Session: 20 min  Stress: No Stress Concern Present (05/03/2021)   [place]    Feeling of Stress : Not at all  Social Connections: Socially Isolated (05/03/2021)   Social Connection and Isolation Panel [NHANES]    Frequency of Communication with Friends and Family: More than three times a week    Frequency of Social Gatherings with Friends and Family: Twice a week    Attends Religious Services: Never    Marine scientist or Organizations: No    Attends Archivist Meetings: Never    Marital Status: Divorced    Tobacco Counseling Counseling given: Not Answered   Clinical Intake:                 Diabetic?No         Activities of Daily Living     No data to display          Patient Care Team: Virgie Dad, MD as PCP - General (Internal Medicine)  Indicate any recent Medical Services you may have received from other than [facility] providers in the past year (date may be approximate).     Assessment:   This is a routine wellness examination for [place].  Hearing/Vision screen No results found.  Dietary issues and  exercise activities discussed:     Goals Addressed   None    Depression Screen    05/03/2021   10:34 AM  PHQ 2/9 Scores  PHQ - 2 Score 0    Fall Risk    05/03/2021   10:36 AM  [place] in the past year? 1  Number falls in past yr: 0  Injury with Fall? 0  Risk for fall due to : History of fall(s);Impaired balance/gait;Impaired mobility  Follow up Falls evaluation completed;Education provided;Falls prevention discussed    FALL RISK PREVENTION PERTAINING TO THE HOME:  Any stairs in or around the home? No  If so, are there any without handrails? Yes  Home free of loose throw rugs in walkways, pet beds, electrical cords, etc? Yes  Adequate lighting in your home to reduce risk of falls? Yes   ASSISTIVE DEVICES UTILIZED TO PREVENT FALLS:  Life alert? No  Use of a cane, walker or w/c? Yes  Grab bars in the bathroom? Yes  Shower chair or bench in shower? Yes  Elevated toilet seat or a handicapped toilet? Yes   TIMED UP AND GO:  Was the test performed? No .  Length of time to ambulate 10 feet: N/A sec.   Gait slow and steady with assistive device  Cognitive Function:    05/03/2021   10:38 AM  MMSE - Mini Mental State Exam  Not completed: Refused        05/03/2021   10:38 AM  6CIT Screen  What Year? 0 points  What month? 0 points  What time? 3 points  Count back from 20 0 points  Months in  reverse 0 points  Repeat phrase 0 points  Total Score 3 points    Immunizations Immunization History  Administered Date(s) Administered   Fluad Quad(high Dose 65+) 05/28/2019, 07/25/2022   Influenza Split 07/12/2016, 08/13/2017, 06/02/2019, 09/09/2020   Influenza, High Dose Seasonal PF 06/11/2017, 06/28/2018   Influenza-Unspecified 06/21/2020, 07/25/2021   Moderna SARS-COV2 Booster Vaccination 03/08/2021   Moderna Sars-Covid-2 Vaccination 10/05/2019, 11/02/2019, 08/15/2020   PFIZER(Purple Top)SARS-COV-2 Vaccination 06/21/2021   Pneumococcal Conjugate-13  11/22/2014   Pneumococcal Polysaccharide-23 04/29/2009, 08/13/2017   Td 01/24/2013   Tdap 12/28/2017, 07/01/2018, 12/13/2020   Zoster Recombinat (Shingrix) 07/01/2018   Zoster, Live 11/12/2009, 07/01/2018   Zoster, Unspecified 12/17/2005    TDAP status: Up to date  Flu Vaccine status: Up to date  Pneumococcal vaccine status: Up to date  Covid-19 vaccine status: Completed vaccines  Qualifies for Shingles Vaccine? Yes   Zostavax completed Yes   Shingrix Completed?: Yes  Screening Tests Health Maintenance  Topic Date Due   Zoster Vaccines- Shingrix (2 of 2) 08/26/2018   Medicare Annual Wellness (AWV)  05/03/2022   COVID-19 Vaccine (5 - 2023-24 season) 06/01/2022   DTaP/Tdap/Td (5 - Td or Tdap) 12/14/2030   Pneumonia Vaccine 38+ Years old  Completed   INFLUENZA VACCINE  Completed   DEXA SCAN  Completed   HPV VACCINES  Aged Out    Health Maintenance  Health Maintenance Due  Topic Date Due   Zoster Vaccines- Shingrix (2 of 2) 08/26/2018   Medicare Annual Wellness (AWV)  05/03/2022   COVID-19 Vaccine (5 - 2023-24 season) 06/01/2022    Colorectal cancer screening: No longer required.   Mammogram status: No longer required due to advanced age.  Bone Density status: Completed 2021. Results reflect: Bone density results: OSTEOPOROSIS. Repeat every 2 years.  Lung Cancer Screening: (Low Dose CT Chest recommended if Age 70-80 years, 30 pack-year currently smoking OR have quit w/in 15years.) does not qualify.   Lung Cancer Screening Referral: No  Additional Screening:  Hepatitis C Screening: does not qualify; advanced age  Vision Screening: Recommended annual ophthalmology exams for early detection of glaucoma and other disorders of the eye. Is the patient up to date with their annual eye exam?  Yes  Who is the provider or what is the name of the office in which the patient attends annual eye exams? Dr. Susa Simmonds If pt is not established with a provider, would they like  to be referred to a provider to establish care? No .   Dental Screening: Recommended annual dental exams for proper oral hygiene  Community Resource Referral / Chronic Care Management: CRR required this visit?  No   CCM required this visit?  No      Plan:     I have personally reviewed and noted the following in the patient's chart:   Medical and social history Use of alcohol, tobacco or illicit drugs  Current medications and supplements including opioid prescriptions. Patient is not currently taking opioid prescriptions. Functional ability and status Nutritional status Physical activity Advanced directives List of other physicians Hospitalizations, surgeries, and ER visits in previous 12 months Vitals Screenings to include cognitive, depression, and falls Referrals and appointments  In addition, I have reviewed and discussed with patient certain preventive protocols, quality metrics, and best practice recommendations. A written personalized care plan for preventive services as well as general preventive health recommendations were provided to patient.     Yvonna Alanis, NP   08/31/2022   Nurse Notes: no recommendations

## 2022-08-31 NOTE — Patient Instructions (Signed)

## 2022-09-03 LAB — IRON AND IRON BINDING CAPACITY (CC-WL,HP ONLY)
Iron: 45 ug/dL (ref 28–170)
Saturation Ratios: 36 % — ABNORMAL HIGH (ref 10.4–31.8)
TIBC: 126 ug/dL — ABNORMAL LOW (ref 250–450)
UIBC: 81 ug/dL — ABNORMAL LOW (ref 148–442)

## 2022-09-21 ENCOUNTER — Inpatient Hospital Stay: Payer: Medicare Other

## 2022-09-21 ENCOUNTER — Ambulatory Visit: Payer: Medicare Other

## 2022-09-21 ENCOUNTER — Other Ambulatory Visit: Payer: Medicare Other

## 2022-09-21 ENCOUNTER — Encounter: Payer: Self-pay | Admitting: Hematology & Oncology

## 2022-09-21 ENCOUNTER — Ambulatory Visit: Payer: Medicare Other | Admitting: Hematology & Oncology

## 2022-09-21 ENCOUNTER — Inpatient Hospital Stay (HOSPITAL_BASED_OUTPATIENT_CLINIC_OR_DEPARTMENT_OTHER): Payer: Medicare Other | Admitting: Hematology & Oncology

## 2022-09-21 ENCOUNTER — Other Ambulatory Visit: Payer: Self-pay

## 2022-09-21 ENCOUNTER — Telehealth: Payer: Self-pay

## 2022-09-21 VITALS — BP 108/52 | HR 90 | Temp 98.0°F | Resp 18 | Ht 66.0 in | Wt 128.2 lb

## 2022-09-21 DIAGNOSIS — C8588 Other specified types of non-Hodgkin lymphoma, lymph nodes of multiple sites: Secondary | ICD-10-CM

## 2022-09-21 DIAGNOSIS — D630 Anemia in neoplastic disease: Secondary | ICD-10-CM | POA: Diagnosis not present

## 2022-09-21 DIAGNOSIS — N183 Chronic kidney disease, stage 3 unspecified: Secondary | ICD-10-CM

## 2022-09-21 DIAGNOSIS — Z8572 Personal history of non-Hodgkin lymphomas: Secondary | ICD-10-CM

## 2022-09-21 DIAGNOSIS — D649 Anemia, unspecified: Secondary | ICD-10-CM

## 2022-09-21 DIAGNOSIS — D509 Iron deficiency anemia, unspecified: Secondary | ICD-10-CM

## 2022-09-21 DIAGNOSIS — D631 Anemia in chronic kidney disease: Secondary | ICD-10-CM

## 2022-09-21 DIAGNOSIS — E8809 Other disorders of plasma-protein metabolism, not elsewhere classified: Secondary | ICD-10-CM

## 2022-09-21 DIAGNOSIS — N1832 Chronic kidney disease, stage 3b: Secondary | ICD-10-CM

## 2022-09-21 LAB — CBC WITH DIFFERENTIAL (CANCER CENTER ONLY)
Abs Immature Granulocytes: 0.05 10*3/uL (ref 0.00–0.07)
Basophils Absolute: 0 10*3/uL (ref 0.0–0.1)
Basophils Relative: 0 %
Eosinophils Absolute: 0 10*3/uL (ref 0.0–0.5)
Eosinophils Relative: 1 %
HCT: 31.7 % — ABNORMAL LOW (ref 36.0–46.0)
Hemoglobin: 10.1 g/dL — ABNORMAL LOW (ref 12.0–15.0)
Immature Granulocytes: 2 %
Lymphocytes Relative: 44 %
Lymphs Abs: 1.4 10*3/uL (ref 0.7–4.0)
MCH: 32.6 pg (ref 26.0–34.0)
MCHC: 31.9 g/dL (ref 30.0–36.0)
MCV: 102.3 fL — ABNORMAL HIGH (ref 80.0–100.0)
Monocytes Absolute: 0.3 10*3/uL (ref 0.1–1.0)
Monocytes Relative: 8 %
Neutro Abs: 1.4 10*3/uL — ABNORMAL LOW (ref 1.7–7.7)
Neutrophils Relative %: 45 %
Platelet Count: 127 10*3/uL — ABNORMAL LOW (ref 150–400)
RBC: 3.1 MIL/uL — ABNORMAL LOW (ref 3.87–5.11)
RDW: 15.2 % (ref 11.5–15.5)
WBC Count: 3.2 10*3/uL — ABNORMAL LOW (ref 4.0–10.5)
nRBC: 0 % (ref 0.0–0.2)

## 2022-09-21 LAB — CMP (CANCER CENTER ONLY)
ALT: 7 U/L (ref 0–44)
AST: 10 U/L — ABNORMAL LOW (ref 15–41)
Albumin: 2.5 g/dL — ABNORMAL LOW (ref 3.5–5.0)
Alkaline Phosphatase: 75 U/L (ref 38–126)
Anion gap: 13 (ref 5–15)
BUN: 73 mg/dL — ABNORMAL HIGH (ref 8–23)
CO2: 19 mmol/L — ABNORMAL LOW (ref 22–32)
Calcium: 8.6 mg/dL — ABNORMAL LOW (ref 8.9–10.3)
Chloride: 107 mmol/L (ref 98–111)
Creatinine: 3.33 mg/dL (ref 0.44–1.00)
GFR, Estimated: 12 mL/min — ABNORMAL LOW (ref >60)
Glucose, Bld: 105 mg/dL — ABNORMAL HIGH (ref 70–99)
Potassium: 5 mmol/L (ref 3.5–5.1)
Sodium: 139 mmol/L (ref 135–145)
Total Bilirubin: 0.3 mg/dL (ref 0.3–1.2)
Total Protein: 5.9 g/dL — ABNORMAL LOW (ref 6.5–8.1)

## 2022-09-21 LAB — RETICULOCYTES
Immature Retic Fract: 9.4 % (ref 2.3–15.9)
RBC.: 3.08 MIL/uL — ABNORMAL LOW (ref 3.87–5.11)
Retic Count, Absolute: 17.6 10*3/uL — ABNORMAL LOW (ref 19.0–186.0)
Retic Ct Pct: 0.6 % (ref 0.4–3.1)

## 2022-09-21 LAB — SAMPLE TO BLOOD BANK

## 2022-09-21 LAB — FERRITIN: Ferritin: 382 ng/mL — ABNORMAL HIGH (ref 11–307)

## 2022-09-21 MED ORDER — EPOETIN ALFA-EPBX 40000 UNIT/ML IJ SOLN
40000.0000 [IU] | Freq: Once | INTRAMUSCULAR | Status: AC
  Administered 2022-09-21: 40000 [IU] via SUBCUTANEOUS
  Filled 2022-09-21: qty 1

## 2022-09-21 NOTE — Patient Instructions (Signed)

## 2022-09-21 NOTE — Progress Notes (Signed)
Hematology and Oncology Follow Up Visit  Joy Lynch [id number] [birth year] 88 y.o. 09/21/2022   Principle Diagnosis:  Marginal Zone lymphoma -- relapsed Anemia secondary to chronic kidney disease, stage III   Past Therapy: Umbralisib 400 mg po q day, started on 10/22/2020 - discontinued by manufacturer    Current Therapy:        Calquence 100 mg PO daily, started 05/25/2021 -on hold starting 09/21/2022 Retacrit SQ as indicated for Hgb < 11    Interim History:  Joy Lynch is here today.  She is at a [facility].  She is doing well after.  She is getting ready for Christmas.  It sounds like they have quite a few activities for the residents out there.  She is on Calquence.  I am little surprised that her white cell count is dropped so much in 3 weeks.  I would have to hold the [place] for right now.  I do not want to see her white cell count go lower.  She has had no bleeding.  There is been no nausea or vomiting.  She has had no cough or shortness of breath.  She has not noted any swollen lymph nodes.  There is been no rashes.  She has had no leg swelling.  She is going to the bathroom okay.  Her appetite has been pretty good.  She is looking forward to a nice Christmas meal.  Overall, I would have to say that her performance status is probably ECOG 2-3.    Medications:  Allergies as of 09/21/2022       Reactions   Penicillins Hives   Tegretol [carbamazepine] Hives, Other (See Comments)   Turned purple from neck down and drowsy   Lyrica [pregabalin] Other (See Comments)   Drowsy        Medication List        Accurate as of September 21, 2022  2:30 PM. If you have any questions, ask your nurse or doctor.          acetaminophen 500 MG tablet Commonly known as: TYLENOL Take 500 mg by mouth every 6 (six) hours as needed.   acetaminophen 500 MG tablet Commonly known as: TYLENOL Take 500 mg by mouth 3 (three) times daily.   aspirin 81 MG chewable  tablet Chew 81 mg by mouth daily.   Calquence 100 MG tablet Generic drug: acalabrutinib maleate Take 1 tablet (100 mg total) by mouth daily.   cyanocobalamin 1000 MCG tablet Commonly known as: VITAMIN B12 Take 1 tablet (1,000 mcg total) by mouth daily.   famciclovir 125 MG tablet Commonly known as: FAMVIR Take 250 mg by mouth daily. Two tablets   gabapentin 600 MG tablet Commonly known as: NEURONTIN Take 600 mg by mouth 3 (three) times daily.   lactose free nutrition Liqd Take 237 mLs by mouth daily.   levothyroxine 100 MCG tablet Commonly known as: SYNTHROID Take 1 tablet (100 mcg total) by mouth daily.   methocarbamol 500 MG tablet Commonly known as: ROBAXIN Take 0.5 tablets (250 mg total) by mouth 3 (three) times daily as needed for muscle spasms.   Multi-Vitamins Tabs Take 1 tablet by mouth daily.   pantoprazole 40 MG tablet Commonly known as: PROTONIX Take 40 mg by mouth 2 (two) times daily.   polyvinyl alcohol 1.4 % ophthalmic solution Commonly known as: LIQUIFILM TEARS Place 1 drop into both eyes 4 (four) times daily as needed.   Pro-Stat Liqd Take 30 mLs by mouth daily.  simethicone 125 MG chewable tablet Commonly known as: MYLICON Chew 259 mg by mouth 2 (two) times daily as needed for flatulence.   torsemide 10 MG tablet Commonly known as: DEMADEX 10 mg daily as needed.   traMADol 50 MG tablet Commonly known as: ULTRAM Take 25 mg by mouth 3 (three) times daily.   white petrolatum ointment Apply topically 2 (two) times daily.   zinc oxide 20 % ointment Apply 1 application. topically as needed for irritation. To buttocks after every incontinent episode and as needed for redness. May keep at bedside.        Allergies:  Allergies  Allergen Reactions   Penicillins Hives   Tegretol [Carbamazepine] Hives and Other (See Comments)    Turned purple from neck down and drowsy    Lyrica [Pregabalin] Other (See Comments)    Drowsy    Past  Medical History, Surgical history, Social history, and Family History were reviewed and updated.  Review of Systems: Review of Systems  Constitutional: Negative.   HENT: Negative.    Eyes: Negative.   Respiratory: Negative.    Cardiovascular: Negative.   Gastrointestinal: Negative.   Genitourinary: Negative.   Musculoskeletal: Negative.   Skin: Negative.   Neurological: Negative.   Endo/Heme/Allergies: Negative.   Psychiatric/Behavioral: Negative.       Physical Exam:  height is '5\' 6"'$  (1.676 m) and weight is 128 lb 3.2 oz (58.2 kg). Her oral temperature is 98 F (36.7 C). Her blood pressure is 108/52 (abnormal) and her pulse is 90. Her respiration is 18 and oxygen saturation is 93%.   Wt Readings from Last 3 Encounters:  09/21/22 128 lb 3.2 oz (58.2 kg)  08/31/22 130 lb (59 kg)  08/16/22 130 lb (59 kg)    Physical Exam Vitals reviewed.  HENT:     Head: Normocephalic and atraumatic.  Eyes:     Pupils: Pupils are equal, round, and reactive to light.  Cardiovascular:     Rate and Rhythm: Normal rate and regular rhythm.     Heart sounds: Normal heart sounds.  Pulmonary:     Effort: Pulmonary effort is normal.     Breath sounds: Normal breath sounds.  Abdominal:     General: Bowel sounds are normal.     Palpations: Abdomen is soft.  Musculoskeletal:        General: No tenderness or deformity. Normal range of motion.     Cervical back: Normal range of motion.  Lymphadenopathy:     Cervical: No cervical adenopathy.  Skin:    General: Skin is warm and dry.     Findings: No erythema or rash.  Neurological:     Mental Status: She is alert and oriented to person, place, and time.  Psychiatric:        Behavior: Behavior normal.        Thought Content: Thought content normal.        Judgment: Judgment normal.      Lab Results  Component Value Date   WBC 3.2 (L) 09/21/2022   HGB 10.1 (L) 09/21/2022   HCT 31.7 (L) 09/21/2022   MCV 102.3 (H) 09/21/2022   PLT 127  (L) 09/21/2022   Lab Results  Component Value Date   FERRITIN 356 (H) 08/31/2022   IRON 45 08/31/2022   TIBC 126 (L) 08/31/2022   UIBC 81 (L) 08/31/2022   IRONPCTSAT 36 (H) 08/31/2022   Lab Results  Component Value Date   RETICCTPCT 0.6 09/21/2022   RBC 3.08 (L)  09/21/2022   Lab Results  Component Value Date   KPAFRELGTCHN 20.9 (H) 11/16/2021   LAMBDASER 19.0 11/16/2021   KAPLAMBRATIO 1.10 11/16/2021   Lab Results  Component Value Date   IGGSERUM 168 (L) 08/21/2021   IGA 221 08/21/2021   IGMSERUM 1,672 (H) 08/21/2021   No results found for: "TOTALPROTELP", "ALBUMINELP", "A1GS", "A2GS", "BETS", "BETA2SER", "GAMS", "MSPIKE", "SPEI"   Chemistry      Component Value Date/Time   NA 133 (L) 08/31/2022 1533   NA 137 11/10/2020 0000   NA 138 08/16/2017 1256   K 4.9 08/31/2022 1533   K 4.3 08/16/2017 1256   CL 100 08/31/2022 1533   CO2 21 (L) 08/31/2022 1533   CO2 25 08/16/2017 1256   BUN 65 (H) 08/31/2022 1533   BUN 16 11/10/2020 0000   BUN 19.6 08/16/2017 1256   CREATININE 2.77 (H) 08/31/2022 1533   CREATININE 0.7 08/16/2017 1256   GLU 78 11/10/2020 0000      Component Value Date/Time   CALCIUM 9.1 08/31/2022 1533   CALCIUM 9.2 08/16/2017 1256   ALKPHOS 97 08/31/2022 1533   ALKPHOS 42 08/16/2017 1256   AST 12 (L) 08/31/2022 1533   AST 18 08/16/2017 1256   ALT 8 08/31/2022 1533   ALT 11 08/16/2017 1256   BILITOT 0.3 08/31/2022 1533   BILITOT 0.87 08/16/2017 1256       Impression and Plan: Joy Lynch is a very pleasant 88 yo caucasian female with relapsed marginal zone lymphoma and multifactorial anemia.   She is on Calquence.  This is helped control the lymphoma well.  Again, I am not sure as to why her white cell count is low right now.  In 3 weeks it went from 15,000 down to 3200.  Again we will going to stop the Calquence for right now.  I do not see a problem with stopping this for a couple weeks.  She will get her Retacrit today.  I noted that her  renal function certainly is worse.  Again I am unsure as to why this would be.  I do not think has anything to do with her Calquence.  Nothing is anything to do with her white cell count being lower.  We will have her come back in 3 weeks.  We will see what her white cell count is at that time.  We may have to adjust the dose of Calquence even little bit lower.  Volanda Napoleon, MD 12/22/20232:30 PM

## 2022-09-21 NOTE — Telephone Encounter (Signed)
Informed of critical creatinine of 3.3 by lab. MD aware.

## 2022-09-25 LAB — IRON AND IRON BINDING CAPACITY (CC-WL,HP ONLY)
Iron: 15 ug/dL — ABNORMAL LOW (ref 28–170)
Saturation Ratios: 14 % (ref 10.4–31.8)
TIBC: 111 ug/dL — ABNORMAL LOW (ref 250–450)
UIBC: 96 ug/dL — ABNORMAL LOW (ref 148–442)

## 2022-09-28 ENCOUNTER — Encounter: Payer: Self-pay | Admitting: Nurse Practitioner

## 2022-09-28 ENCOUNTER — Emergency Department (HOSPITAL_COMMUNITY): Payer: Medicare Other

## 2022-09-28 ENCOUNTER — Non-Acute Institutional Stay: Payer: Medicare Other | Admitting: Nurse Practitioner

## 2022-09-28 ENCOUNTER — Emergency Department (HOSPITAL_COMMUNITY)
Admission: EM | Admit: 2022-09-28 | Discharge: 2022-09-28 | Disposition: A | Discharge status: Skilled Nursing Facility | Payer: Medicare Other | Attending: Emergency Medicine | Admitting: Emergency Medicine

## 2022-09-28 DIAGNOSIS — W01198A Fall on same level from slipping, tripping and stumbling with subsequent striking against other object, initial encounter: Secondary | ICD-10-CM | POA: Insufficient documentation

## 2022-09-28 DIAGNOSIS — E039 Hypothyroidism, unspecified: Secondary | ICD-10-CM

## 2022-09-28 DIAGNOSIS — D649 Anemia, unspecified: Secondary | ICD-10-CM | POA: Insufficient documentation

## 2022-09-28 DIAGNOSIS — S42491A Other displaced fracture of lower end of right humerus, initial encounter for closed fracture: Secondary | ICD-10-CM | POA: Diagnosis not present

## 2022-09-28 DIAGNOSIS — N1832 Chronic kidney disease, stage 3b: Secondary | ICD-10-CM

## 2022-09-28 DIAGNOSIS — R7309 Other abnormal glucose: Secondary | ICD-10-CM | POA: Insufficient documentation

## 2022-09-28 DIAGNOSIS — Z856 Personal history of leukemia: Secondary | ICD-10-CM | POA: Diagnosis not present

## 2022-09-28 DIAGNOSIS — C8588 Other specified types of non-Hodgkin lymphoma, lymph nodes of multiple sites: Secondary | ICD-10-CM

## 2022-09-28 DIAGNOSIS — I10 Essential (primary) hypertension: Secondary | ICD-10-CM | POA: Diagnosis not present

## 2022-09-28 DIAGNOSIS — D630 Anemia in neoplastic disease: Secondary | ICD-10-CM

## 2022-09-28 DIAGNOSIS — S0083XA Contusion of other part of head, initial encounter: Secondary | ICD-10-CM | POA: Diagnosis not present

## 2022-09-28 DIAGNOSIS — Z7982 Long term (current) use of aspirin: Secondary | ICD-10-CM | POA: Insufficient documentation

## 2022-09-28 DIAGNOSIS — Y92129 Unspecified place in nursing home as the place of occurrence of the external cause: Secondary | ICD-10-CM | POA: Diagnosis not present

## 2022-09-28 DIAGNOSIS — G500 Trigeminal neuralgia: Secondary | ICD-10-CM | POA: Diagnosis not present

## 2022-09-28 DIAGNOSIS — I639 Cerebral infarction, unspecified: Secondary | ICD-10-CM

## 2022-09-28 DIAGNOSIS — G43001 Migraine without aura, not intractable, with status migrainosus: Secondary | ICD-10-CM | POA: Diagnosis not present

## 2022-09-28 DIAGNOSIS — W19XXXD Unspecified fall, subsequent encounter: Secondary | ICD-10-CM

## 2022-09-28 DIAGNOSIS — D72819 Decreased white blood cell count, unspecified: Secondary | ICD-10-CM | POA: Diagnosis not present

## 2022-09-28 DIAGNOSIS — S4991XA Unspecified injury of right shoulder and upper arm, initial encounter: Secondary | ICD-10-CM | POA: Diagnosis present

## 2022-09-28 DIAGNOSIS — K219 Gastro-esophageal reflux disease without esophagitis: Secondary | ICD-10-CM

## 2022-09-28 LAB — COMPREHENSIVE METABOLIC PANEL
ALT: 62 U/L — ABNORMAL HIGH (ref 0–44)
AST: 145 U/L — ABNORMAL HIGH (ref 15–41)
Albumin: 1.6 g/dL — ABNORMAL LOW (ref 3.5–5.0)
Alkaline Phosphatase: 84 U/L (ref 38–126)
Anion gap: 9 (ref 5–15)
BUN: 68 mg/dL — ABNORMAL HIGH (ref 8–23)
CO2: 17 mmol/L — ABNORMAL LOW (ref 22–32)
Calcium: 7.9 mg/dL — ABNORMAL LOW (ref 8.9–10.3)
Chloride: 106 mmol/L (ref 98–111)
Creatinine, Ser: 3.13 mg/dL — ABNORMAL HIGH (ref 0.44–1.00)
GFR, Estimated: 13 mL/min — ABNORMAL LOW (ref >60)
Glucose, Bld: 118 mg/dL — ABNORMAL HIGH (ref 70–99)
Potassium: 5.2 mmol/L — ABNORMAL HIGH (ref 3.5–5.1)
Sodium: 132 mmol/L — ABNORMAL LOW (ref 135–145)
Total Bilirubin: 0.3 mg/dL (ref 0.3–1.2)
Total Protein: 5.6 g/dL — ABNORMAL LOW (ref 6.5–8.1)

## 2022-09-28 LAB — CK: Total CK: 58 U/L (ref 38–234)

## 2022-09-28 LAB — CBC WITH DIFFERENTIAL/PLATELET
Abs Immature Granulocytes: 0.05 10*3/uL (ref 0.00–0.07)
Basophils Absolute: 0 10*3/uL (ref 0.0–0.1)
Basophils Relative: 0 %
Eosinophils Absolute: 0 10*3/uL (ref 0.0–0.5)
Eosinophils Relative: 0 %
HCT: 35.7 % — ABNORMAL LOW (ref 36.0–46.0)
Hemoglobin: 11.4 g/dL — ABNORMAL LOW (ref 12.0–15.0)
Immature Granulocytes: 2 %
Lymphocytes Relative: 24 %
Lymphs Abs: 0.8 10*3/uL (ref 0.7–4.0)
MCH: 32.9 pg (ref 26.0–34.0)
MCHC: 31.9 g/dL (ref 30.0–36.0)
MCV: 103.2 fL — ABNORMAL HIGH (ref 80.0–100.0)
Monocytes Absolute: 0.3 10*3/uL (ref 0.1–1.0)
Monocytes Relative: 11 %
Neutro Abs: 2 10*3/uL (ref 1.7–7.7)
Neutrophils Relative %: 63 %
Platelets: 175 10*3/uL (ref 150–400)
RBC: 3.46 MIL/uL — ABNORMAL LOW (ref 3.87–5.11)
RDW: 15.4 % (ref 11.5–15.5)
WBC: 3.1 10*3/uL — ABNORMAL LOW (ref 4.0–10.5)
nRBC: 0 % (ref 0.0–0.2)

## 2022-09-28 LAB — CBG MONITORING, ED: Glucose-Capillary: 116 mg/dL — ABNORMAL HIGH (ref 70–99)

## 2022-09-28 MED ORDER — FENTANYL CITRATE PF 50 MCG/ML IJ SOSY
50.0000 ug | PREFILLED_SYRINGE | Freq: Once | INTRAMUSCULAR | Status: AC
  Administered 2022-09-28: 50 ug via INTRAVENOUS
  Filled 2022-09-28: qty 1

## 2022-09-28 MED ORDER — SODIUM CHLORIDE 0.9 % IV BOLUS
500.0000 mL | Freq: Once | INTRAVENOUS | Status: AC
  Administered 2022-09-28: 500 mL via INTRAVENOUS

## 2022-09-28 MED ORDER — FENTANYL CITRATE PF 50 MCG/ML IJ SOSY: 12.5000 ug | PREFILLED_SYRINGE | Freq: Once | INTRAMUSCULAR | Status: DC

## 2022-09-28 MED ORDER — ONDANSETRON HCL 4 MG/2ML IJ SOLN
4.0000 mg | Freq: Once | INTRAMUSCULAR | Status: AC
  Administered 2022-09-28: 4 mg via INTRAVENOUS
  Filled 2022-09-28: qty 2

## 2022-09-28 NOTE — Assessment & Plan Note (Signed)
Managed,  takes  Gabapentin.

## 2022-09-28 NOTE — Progress Notes (Signed)
Orthopedic Tech Progress Note Patient Details:  Joy Lynch 1926-07-21 [id number]  Ortho Devices Type of Ortho Device: Coapt, Sling immobilizer Ortho Device/Splint Location: RUE Ortho Device/Splint Interventions: Ordered, Application, Adjustment   Post Interventions Patient Tolerated: Well Instructions Provided: Care of device Spoke with on call ortho MD and confirmed a coaptation splint is what was needed, not a long arm posterior. Splint applied to my best ability, had some difficulty as the patient was in an awkward position , even after repositioning her for splint application. [name] 09/28/2022, 7:37 PM

## 2022-09-28 NOTE — ED Notes (Signed)
This RN attempted to call family Gwyndolyn Saxon and Romie Minus) to update them that pt is being d/c and transferred back to [facility] via [place]. However, family did not answer.

## 2022-09-28 NOTE — Discharge Instructions (Signed)
You were seen in the emergency department today for a fall. You have fracture your humerus in your right arm. We placed a long splint on your arm and put your arm in a sling to help support it. You will need to follow-up with Dr. Stann Mainland, orthopedic surgery within the next week. Additionally, we would like your creatinine rechecked in the next week. Please return to the emergency department for significant pain in the right arm or tightness in the cast.

## 2022-09-28 NOTE — ED Notes (Signed)
Patient verbalizes understanding of d/c instructions. Opportunities for questions and answers were provided. Pt d/c from ED and transferred back to [facility] via [place].

## 2022-09-28 NOTE — Assessment & Plan Note (Signed)
recommended MAP range 70-90, off Propranolol

## 2022-09-28 NOTE — Assessment & Plan Note (Signed)
taking Pantoprazole

## 2022-09-28 NOTE — Assessment & Plan Note (Signed)
Receiving EPO, Hgb 10.1

## 2022-09-28 NOTE — ED Triage Notes (Signed)
Unwitnessed fall in room. Pt lost balance while walking and landed to right side. Obvious deformity to right upper arm noted, sling placed by EMS. Not on blood thinners. Alert and oriented at baseline. Hematoma to forehead also noted. Fentanyl 164mg given by EMS. [place]

## 2022-09-28 NOTE — ED Provider Notes (Signed)
[facility] EMERGENCY DEPARTMENT Provider Note   CSN: [id number] Arrival date & time: 09/28/22  1618     History  Chief Complaint  Patient presents with   [name]    Joy Lynch is a 88 y.o. female. With past medical history of CLL, marginal zone lymphoma on acalabrutinib and Famciclovir, frequent falls, hypothyroidism, who presents to the emergency department with fall.  States she was in her room when she bent over to grab some jeans on the floor and fell. States she fell onto her right side onto the ground. She struck her head and states this was on the floor but is unsure if she lost consciousness. She is unsure how long she was on the floor for. Complains of pain to her right arm. She denies dizziness, shortness of breath, chest pain prior to falling. She feels lightheaded now. Not anticoagulated. Per EMS had unwitnessed fall in room. Apparently alert and oriented at baseline.   HPI     Home Medications Prior to Admission medications   Medication Sig Start Date End Date Taking? Authorizing Provider  acalabrutinib maleate (CALQUENCE) 100 MG tablet Take 1 tablet (100 mg total) by mouth daily. 07/26/22   Volanda Napoleon, MD  acetaminophen (TYLENOL) 500 MG tablet Take 500 mg by mouth 3 (three) times daily.    [provider]  Amino Acids-Protein Hydrolys (PRO-STAT) LIQD Take 30 mLs by mouth daily.    [provider]  aspirin 81 MG chewable tablet Chew 81 mg by mouth daily.    [provider]  famciclovir (FAMVIR) 125 MG tablet Take 250 mg by mouth daily. Two tablets    [provider]  gabapentin (NEURONTIN) 600 MG tablet Take 600 mg by mouth 3 (three) times daily. 06/13/20   [provider]  lactose free nutrition (BOOST) LIQD Take 237 mLs by mouth daily.    [provider]  levothyroxine (SYNTHROID) 100 MCG tablet Take 1 tablet (100 mcg total) by mouth daily. 08/03/22   Fargo, Amy E, NP  methocarbamol (ROBAXIN)  500 MG tablet Take 0.5 tablets (250 mg total) by mouth 3 (three) times daily as needed for muscle spasms. 07/05/22   Fargo, Amy E, NP  Multiple Vitamin (MULTI-VITAMINS) TABS Take 1 tablet by mouth daily.    [provider]  pantoprazole (PROTONIX) 40 MG tablet Take 40 mg by mouth 2 (two) times daily.    [provider]  polyvinyl alcohol (LIQUIFILM TEARS) 1.4 % ophthalmic solution Place 1 drop into both eyes 4 (four) times daily as needed.    [provider]  simethicone (MYLICON) 734 MG chewable tablet Chew 125 mg by mouth 2 (two) times daily as needed for flatulence.    [provider]  torsemide (DEMADEX) 10 MG tablet 10 mg daily as needed. 12/10/20   [provider]  traMADol (ULTRAM) 50 MG tablet Take 25 mg by mouth 3 (three) times daily.    [provider]  vitamin B-12 (CYANOCOBALAMIN) 1000 MCG tablet Take 1 tablet (1,000 mcg total) by mouth daily. 05/19/21   Fargo, Amy E, NP  white petrolatum ointment Apply topically 2 (two) times daily.    [provider]  zinc oxide 20 % ointment Apply 1 application. topically as needed for irritation. To buttocks after every incontinent episode and as needed for redness. May keep at bedside.    [provider]      Allergies    Penicillins, Tegretol [carbamazepine], and Lyrica [pregabalin]  Review of Systems   Review of Systems  Musculoskeletal:  Positive for joint swelling and myalgias.  Neurological:  Positive for light-headedness.  All other systems reviewed and are negative.   Physical Exam Updated Vital Signs BP 108/60 (BP Location: Left Arm)   Pulse 99   Temp (!) 96.7 F (35.9 C) (Temporal)   Resp 15   Ht '5\' 6"'$  (1.676 m)   Wt 53.7 kg   SpO2 99%   BMI 19.11 kg/m  Physical Exam Vitals and nursing note reviewed.  Constitutional:      Appearance: She is ill-appearing.  HENT:     Head: Normocephalic.     Comments: Contusion and swelling to the right forehead      Nose: Nose normal.     Mouth/Throat:     Mouth: Mucous membranes are dry.     Pharynx: Oropharynx is clear.  Eyes:     General: No scleral icterus.    Extraocular Movements: Extraocular movements intact.     Pupils: Pupils are equal, round, and reactive to light.  Neck:     Comments: In c-collar on arrival  Cardiovascular:     Rate and Rhythm: Normal rate and regular rhythm.     Pulses: Normal pulses.     Heart sounds: No murmur heard. Pulmonary:     Effort: Pulmonary effort is normal. No respiratory distress.     Breath sounds: Normal breath sounds.  Abdominal:     General: Bowel sounds are normal. There is no distension.     Palpations: Abdomen is soft.     Tenderness: There is no abdominal tenderness.  Musculoskeletal:     Cervical back: No tenderness.     Comments: Right upper arm with obvious deformity and swelling. TTP. Right radial pulse 1+. Sensation intact. Compartments soft. Strength decreased 2/2 pain.   No pain to palpation of the pelvis.  Log rolled with c-spine precautions. No obvious step offs or deformities. No tenderness to palpation of the CTL spine.   Skin:    General: Skin is warm and dry.     Capillary Refill: Capillary refill takes less than 2 seconds.     Findings: Bruising present.  Neurological:     General: No focal deficit present.     Mental Status: She is confused.  Psychiatric:        Mood and Affect: Mood normal.        Behavior: Behavior normal.     ED Results / Procedures / Treatments   Labs (all labs ordered are listed, but only abnormal results are displayed) Labs Reviewed  COMPREHENSIVE METABOLIC PANEL - Abnormal; Notable for the following components:      Result Value   Sodium 132 (*)    Potassium 5.2 (*)    CO2 17 (*)    Glucose, Bld 118 (*)    BUN 68 (*)    Creatinine, Ser 3.13 (*)    Calcium 7.9 (*)    Total Protein 5.6 (*)    Albumin 1.6 (*)    AST 145 (*)    ALT 62 (*)    GFR, Estimated 13 (*)    All other  components within normal limits  CBC WITH DIFFERENTIAL/PLATELET - Abnormal; Notable for the following components:   WBC 3.1 (*)    RBC 3.46 (*)    Hemoglobin 11.4 (*)    HCT 35.7 (*)    MCV 103.2 (*)    All other components within normal limits  CBG MONITORING,  ED - Abnormal; Notable for the following components:   Glucose-Capillary 116 (*)    All other components within normal limits  CK  URINALYSIS, ROUTINE W REFLEX MICROSCOPIC    EKG EKG Interpretation  Date/Time:  Friday September 28 2022 16:40:57 EST Ventricular Rate:  102 PR Interval:  168 QRS Duration: 70 QT Interval:  368 QTC Calculation: 479 R Axis:   56 Text Interpretation: Sinus tachycardia Cannot rule out Anterior infarct , age undetermined Abnormal ECG When compared with ECG of 24-Aug-2020 18:10, PREVIOUS ECG IS PRESENT Confirmed by Octaviano Glow [phone]) on 09/28/2022 5:03:19 PM  Radiology DG Humerus Right  Result Date: 09/28/2022 CLINICAL DATA:  fall, obvious deformity EXAM: RIGHT ELBOW - COMPLETE 3+ VIEW; RIGHT HUMERUS - 2+ VIEW COMPARISON:  X-ray right shoulder 09/03/2016 FINDINGS: Right humerus: Acute comminuted and displaced spiral fracture of the distal humeral shaft distal to a total right shoulder arthroplasty. No radiographic findings suggest surgical hardware complication. No periprosthetic lucency. Right elbow: No evidence of fracture, dislocation, or joint effusion. No evidence of severe arthropathy. No aggressive appearing focal bone abnormality. Soft tissues are unremarkable. IMPRESSION: 1. Acute comminuted and displaced spiral fracture of the distal humeral shaft distal to a total right shoulder arthroplasty. 2. No acute displaced fracture or dislocation of the bones of the right elbow. Electronically Signed   By: Iven Finn M.D.   On: 09/28/2022 17:50   DG Elbow Complete Right  Result Date: 09/28/2022 CLINICAL DATA:  fall, obvious deformity EXAM: RIGHT ELBOW - COMPLETE 3+ VIEW; RIGHT HUMERUS - 2+  VIEW COMPARISON:  X-ray right shoulder 09/03/2016 FINDINGS: Right humerus: Acute comminuted and displaced spiral fracture of the distal humeral shaft distal to a total right shoulder arthroplasty. No radiographic findings suggest surgical hardware complication. No periprosthetic lucency. Right elbow: No evidence of fracture, dislocation, or joint effusion. No evidence of severe arthropathy. No aggressive appearing focal bone abnormality. Soft tissues are unremarkable. IMPRESSION: 1. Acute comminuted and displaced spiral fracture of the distal humeral shaft distal to a total right shoulder arthroplasty. 2. No acute displaced fracture or dislocation of the bones of the right elbow. Electronically Signed   By: Iven Finn M.D.   On: 09/28/2022 17:50   CT Head Wo Contrast  Result Date: 09/28/2022 CLINICAL DATA:  Head trauma, moderate-severe; Neck trauma (Age >= 65y) EXAM: CT HEAD WITHOUT CONTRAST CT CERVICAL SPINE WITHOUT CONTRAST TECHNIQUE: Multidetector CT imaging of the head and cervical spine was performed following the standard protocol without intravenous contrast. Multiplanar CT image reconstructions of the cervical spine were also generated. RADIATION DOSE REDUCTION: This exam was performed according to the departmental dose-optimization program which includes automated exposure control, adjustment of the mA and/or kV according to patient size and/or use of iterative reconstruction technique. COMPARISON:  None Available. FINDINGS: CT HEAD FINDINGS Brain: Cerebral ventricle sizes are concordant with the degree of cerebral volume loss. Patchy and confluent areas of decreased attenuation are noted throughout the deep and periventricular white matter of the cerebral hemispheres bilaterally, compatible with chronic microvascular ischemic disease. No evidence of large-territorial acute infarction. No parenchymal hemorrhage. No mass lesion. No extra-axial collection. No mass effect or midline shift. No  hydrocephalus. Basilar cisterns are patent. Vascular: No hyperdense vessel. Skull: No acute fracture or focal lesion. Sinuses/Orbits: Paranasal sinuses and mastoid air cells are clear. Bilateral lens replacement. Otherwise the orbits are unremarkable. Other: 3 mm right frontal scalp hematoma formation. CT CERVICAL SPINE FINDINGS Alignment: Reversal normal cervical lordosis at the C6 level likely due  to positioning and degenerative changes. Grade 1 anterolisthesis C2 on C3, C3 on C4, C4 on C5, C5 on C6. Mild retrolisthesis of C6 on C7. Skull base and vertebrae: Multilevel severe degenerative changes most prominent at the C6-C7 level. Associated multilevel moderate to severe osseous neural foraminal stenosis. No associated severe osseous central canal stenosis. No acute fracture. No aggressive appearing focal osseous lesion or focal pathologic process. Soft tissues and spinal canal: No prevertebral fluid or swelling. No visible canal hematoma. Upper chest: Biapical pleural/pulmonary scarring. Other: Atherosclerotic plaque of the carotid arteries within the neck. IMPRESSION: 1. No acute intracranial abnormality. 2. No acute displaced fracture or traumatic listhesis of the cervical spine. Electronically Signed   By: Iven Finn M.D.   On: 09/28/2022 17:42   CT Cervical Spine Wo Contrast  Result Date: 09/28/2022 CLINICAL DATA:  Head trauma, moderate-severe; Neck trauma (Age >= 65y) EXAM: CT HEAD WITHOUT CONTRAST CT CERVICAL SPINE WITHOUT CONTRAST TECHNIQUE: Multidetector CT imaging of the head and cervical spine was performed following the standard protocol without intravenous contrast. Multiplanar CT image reconstructions of the cervical spine were also generated. RADIATION DOSE REDUCTION: This exam was performed according to the departmental dose-optimization program which includes automated exposure control, adjustment of the mA and/or kV according to patient size and/or use of iterative reconstruction  technique. COMPARISON:  None Available. FINDINGS: CT HEAD FINDINGS Brain: Cerebral ventricle sizes are concordant with the degree of cerebral volume loss. Patchy and confluent areas of decreased attenuation are noted throughout the deep and periventricular white matter of the cerebral hemispheres bilaterally, compatible with chronic microvascular ischemic disease. No evidence of large-territorial acute infarction. No parenchymal hemorrhage. No mass lesion. No extra-axial collection. No mass effect or midline shift. No hydrocephalus. Basilar cisterns are patent. Vascular: No hyperdense vessel. Skull: No acute fracture or focal lesion. Sinuses/Orbits: Paranasal sinuses and mastoid air cells are clear. Bilateral lens replacement. Otherwise the orbits are unremarkable. Other: 3 mm right frontal scalp hematoma formation. CT CERVICAL SPINE FINDINGS Alignment: Reversal normal cervical lordosis at the C6 level likely due to positioning and degenerative changes. Grade 1 anterolisthesis C2 on C3, C3 on C4, C4 on C5, C5 on C6. Mild retrolisthesis of C6 on C7. Skull base and vertebrae: Multilevel severe degenerative changes most prominent at the C6-C7 level. Associated multilevel moderate to severe osseous neural foraminal stenosis. No associated severe osseous central canal stenosis. No acute fracture. No aggressive appearing focal osseous lesion or focal pathologic process. Soft tissues and spinal canal: No prevertebral fluid or swelling. No visible canal hematoma. Upper chest: Biapical pleural/pulmonary scarring. Other: Atherosclerotic plaque of the carotid arteries within the neck. IMPRESSION: 1. No acute intracranial abnormality. 2. No acute displaced fracture or traumatic listhesis of the cervical spine. Electronically Signed   By: Iven Finn M.D.   On: 09/28/2022 17:42    Procedures Procedures   Medications Ordered in ED Medications  fentaNYL (SUBLIMAZE) injection 12.5 mcg (12.5 mcg Intravenous Not Given  09/28/22 1944)  fentaNYL (SUBLIMAZE) injection 50 mcg (50 mcg Intravenous Given 09/28/22 1707)  ondansetron (ZOFRAN) injection 4 mg (4 mg Intravenous Given 09/28/22 1821)  sodium chloride 0.9 % bolus 500 mL (500 mLs Intravenous New Bag/Given 09/28/22 1944)    ED Course/ Medical Decision Making/ A&P Clinical Course as of 09/28/22 2019  Fri Sep 28, 2022  1758 This is a 88 year old female presenting to the ED with a mechanical fall at home.  Her workup here was notable for a mid humeral fracture of the right side,  which is a closed fracture.  She is neurovascularly intact.  Her pain was controlled with fentanyl.  She does appear sedated on exam.  She typically walks with a walker, states she lives at [facility] (nursing). [MT]  1818 Dr Stann Mainland orthopedics recommends a long-arm splint for extra stability and a sling and the patient can follow-up in the office in a week or 2 [MT]    Clinical Course User Index [MT] Trifan, Carola Rhine, MD                           Medical Decision Making Amount and/or Complexity of Data Reviewed Labs: ordered. Radiology: ordered.  Risk Prescription drug management.   Initial Impression and Ddx 88 year old female who presents to the emergency department after mechanical fall.  Obvious deformity to the right upper arm.  In a c-collar on arrival.  She is somewhat confused here in drowsy appearing, however she received 100 mcg of fentanyl prior to arrival by EMS.  Will continue to monitor.  She has a contusion to the forehead without laceration.  The chest and pelvis appear stable.  Lower extremities are stable. Patient PMH that increases complexity of ED encounter: Marginal zone lymphoma, frequent falls, hypothyroid  Interpretation of Diagnostics I independent reviewed and interpreted the labs as followed: White blood cell 3.1, hemoglobin 11.4, stable from previous.  CK is in normal limits.  CMP with mildly elevated potassium to 5.2, creatinine 3.3.  Does appear  similar to previous about 1 week ago when she was seen by Dr. Marin Olp with oncology.  Liver function test consistent with possible decreased p.o. intake.  - I independently visualized the following imaging with scope of interpretation limited to determining acute life threatening conditions related to emergency care: CT of the head and C-spine, which revealed no acute abnormalities.  Right humerus plain film shows 1. Acute comminuted and displaced spiral fracture of the distal  humeral shaft distal to a total right shoulder arthroplasty.  Plain film of the elbow is normal  Patient Reassessment and Ultimate Disposition/Management Continues to have pain to the right upper arm I consulted and spoke with Dr. Stann Mainland with orthopedic surgery who recommends long-arm splint and sling and will follow-up in the clinic.  CT head and C-spine are negative.  C-spine cleared after this. Labs as above.  Was given 500 mL IV fluids for elevated creatinine.  Creatinine was elevated 1 week ago with oncology.  Question medications that she is on for lymphoma.  LFTs as above but no elevated bilirubin, abdominal pain.  May just be diet related versus medications for lymphoma.  Will need to be rechecked.  Believe she did have a mechanical fall.  She has no chest pain, shortness of breath or prodromal symptoms concerning for other etiology.  Her lungs are clear bilaterally.  Do not feel that she requires D-dimer or troponin testing at this time.  The CT head and C-spine are negative.  Pelvis is stable.  Fracture of the right humerus as noted above.  This was splinted with a long-arm cast and sling.  She has to follow-up in 1 week.  It was unclear how long she was on the floor floor but however CK is normal.  Will have her follow-up with PCP in 1 week for creatinine and LFT recheck.  Called and spoke with night shift RN at [facility].  She states that the patient is on the assisted living side of the  facility with  more independent care.  She states that should it be needed the patient will be moved over to skilled nursing and have PT/OT and ongoing management till she is able to move back.  The patient has been appropriately medically screened and/or stabilized in the ED. I have low suspicion for any other emergent medical condition which would require further screening, evaluation or treatment in the ED or require inpatient management. At time of discharge the patient is hemodynamically stable and in no acute distress. I have discussed work-up results and diagnosis with patient and answered all questions. Patient is agreeable with discharge plan. We discussed strict return precautions for returning to the emergency department and they verbalized understanding.     Patient management required discussion with the following services or consulting groups:  Case Management/Social Work and Orthopedic Surgery  Complexity of Problems Addressed Acute complicated illness or Injury  Additional Data Reviewed and Analyzed Further history obtained from: EMS on arrival, Past medical history and medications listed in the EMR, Prior ED visit notes, Care Everywhere, Prior labs/imaging results, and Records from care facility  Patient Encounter Risk Assessment SDOH impact on management, Consideration of hospitalization, and Major procedures  Final Clinical Impression(s) / ED Diagnoses Final diagnoses:  Fall, subsequent encounter  Other closed displaced fracture of distal end of right humerus, initial encounter    Rx / DC Orders ED Discharge Orders          Ordered    Ambulatory referral to Orthopedic Surgery        09/28/22 2017              Mickie Hillier, PA-C 09/28/22 2023    Wyvonnia Dusky, MD 09/28/22 2320

## 2022-09-28 NOTE — Assessment & Plan Note (Signed)
low TSH 0.02 09/27/22, Levothyroxine dose was reduced to 131mg qd 2/2 TSH 0.15 2 months ago, will taper down Levothyroxine 751m/100mcg, repeat TSH 8 weeks.

## 2022-09-28 NOTE — Assessment & Plan Note (Signed)
off Propranolol, on Gabapentin

## 2022-09-28 NOTE — Assessment & Plan Note (Signed)
Bun/creat 73/3.33 09/21/22

## 2022-09-28 NOTE — Assessment & Plan Note (Signed)
Chronic left cerebellar infarct, hx of, no apparent focal residual.

## 2022-09-28 NOTE — Progress Notes (Signed)
Location:  [facility] Room Number: AL/09/A Place of Service:  ALF (13) Provider:  Vona Whiters X, NP  Patient Care Team: Virgie Dad, MD as PCP - General (Internal Medicine)  Extended Emergency Contact Information Primary Emergency Contact: Genia Plants Address: [address]          [place], [place] [address] Johnnette Litter of [place] Phone: [phone] Relation: Alanson Puls Secondary Emergency Contact: [name] Address: [address]          [place], [place] [address] Johnnette Litter of [place] Phone: [phone] Mobile Phone: [phone] Relation: Nephew  Code Status:  FULL Goals of care: Advanced Directive information    09/21/2022    2:15 PM  Advanced Directives  Does Patient Have a Medical Advance Directive? Yes  Copy of [place] in Chart? Yes - validated most recent copy scanned in chart (See row information)     Chief Complaint  Patient presents with   Acute Visit    Patient is being seen for low TSH    HPI:  Pt is a 88 y.o. female seen today for an acute visit for low TSH 0.02 09/27/22, Levothyroxine dose was reduced to 122mg qd 2/2 TSH 0.15 2 months ago  Edema BLE, prn  Torsemide              Marginal zone lymphoma, f/u oncology, on Calquence, Famciclovir             Chronic left cerebellar infarct, hx of, no apparent focal residual.              Gait abnormality, uses Walker.              Frequent falls             CKD, Bun/creat 73/3.33 09/21/22             Anemia, EPO, Hgb 10.1             OP DEXA 02/25/20 tscore -3.8             Migraines, off Propranolol, on Gabapentin             HTN recommended MAP range 70-90, off Propranolol             Trigeminal neuralgia, takes  Gabapentin.   GERD, taking Pantoprazole  Lower back pain, takes Robaxin, Tramadol, Gabapentin.                    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[place]    [facility]  History of B-cell lymphoma 11/02/2015   History of falling    [place]  [place]roidism, unspecified    [facility] [place]  Lymphedema, not elsewhere classified    [facility]  Migraines    Muscle weakness (generalized)    [place]  Neuralgia and neuritis, unspecified    [place]  Nontraumatic subarachnoid hemorrhage, unspecified ([place]    [facility]  Osteoporosis    Pain in right shoulder    Tachycardia, unspecified  [facility]   Thyroid disease    Trigeminal neuralgia of left side of face    Unsteadiness on feet    [facility]   Past Surgical History:  Procedure Laterality Date   gamma knife     for trigeminal neuralgia   SHOULDER SURGERY     Right shoulder replacement   TONSILLECTOMY AND ADENOIDECTOMY     WISDOM TOOTH EXTRACTION      Allergies  Allergen Reactions   Penicillins Hives   Tegretol [Carbamazepine] Hives and Other (See Comments)    Turned purple from neck down and drowsy    Lyrica [Pregabalin] Other (See Comments)    Drowsy    Outpatient Encounter Medications as of 09/28/2022  Medication Sig   acalabrutinib maleate (CALQUENCE) 100 MG tablet Take 1 tablet (100 mg total) by mouth daily.   acetaminophen (TYLENOL) 500 MG tablet Take 500 mg by mouth 3 (three) times daily.   Amino Acids-Protein Hydrolys (PRO-STAT) LIQD Take 30 mLs by mouth daily.   aspirin 81 MG chewable tablet Chew 81 mg by mouth daily.   famciclovir (FAMVIR) 125 MG tablet Take 250 mg by mouth daily. Two tablets   gabapentin (NEURONTIN) 600 MG tablet Take 600 mg by mouth 3 (three) times daily.   lactose free nutrition (BOOST) LIQD Take 237 mLs by mouth daily.   levothyroxine (SYNTHROID) 100 MCG tablet Take 1 tablet (100 mcg total) by mouth daily.    methocarbamol (ROBAXIN) 500 MG tablet Take 0.5 tablets (250 mg total) by mouth 3 (three) times daily as needed for muscle spasms.   Multiple Vitamin (MULTI-VITAMINS) TABS Take 1 tablet by mouth daily.   pantoprazole (PROTONIX) 40 MG tablet Take 40 mg by mouth 2 (two) times daily.   polyvinyl alcohol (LIQUIFILM TEARS) 1.4 % ophthalmic solution Place 1 drop into both eyes 4 (four) times daily as needed.   simethicone (MYLICON) 564 MG chewable tablet Chew 125 mg by mouth 2 (two) times daily as needed for flatulence.   torsemide (DEMADEX) 10 MG tablet 10 mg daily as needed.   traMADol (ULTRAM) 50 MG tablet Take 25 mg by mouth 3 (three) times daily.   vitamin B-12 (CYANOCOBALAMIN) 1000 MCG tablet Take 1 tablet (1,000 mcg total) by mouth daily.   white petrolatum ointment Apply topically 2 (two) times daily.   zinc oxide 20 % ointment Apply 1 application. topically as needed for irritation. To buttocks after every incontinent episode and as needed for redness. May keep at bedside.   [DISCONTINUED] acetaminophen (TYLENOL) 500 MG tablet Take 500 mg by mouth every 6 (six) hours as needed. (Patient not taking: Reported on 09/21/2022)   Facility-Administered Encounter Medications as of 09/28/2022  Medication   epoetin alfa-epbx (RETACRIT) injection 40,000 Units    Review of Systems  Constitutional:  Negative for fatigue and fever.  HENT:  Positive for hearing loss. Negative for congestion and trouble swallowing.   Eyes:  Negative for visual disturbance.  Respiratory:  Negative for cough and shortness of breath.   Cardiovascular:  Positive for leg swelling. Negative for palpitations.  Gastrointestinal:  Negative for abdominal pain and constipation.  Genitourinary:  Negative for dysuria, frequency and urgency.       Bartholin cysts per history  Musculoskeletal:  Positive for arthralgias, back pain and gait problem.       Ambulates with walker. C/o R hip to R ankle pain, more at night, less when she  is up/walking but she felt it.   Skin:  Negative for color change.  Neurological:  Negative for speech difficulty, weakness and headaches.  Psychiatric/Behavioral:  Negative for confusion and sleep disturbance. The patient is not nervous/anxious.     Immunization History  Administered Date(s) Administered   Fluad Quad(high Dose 65+) 05/28/2019, 07/25/2022   Influenza Split 07/12/2016, 08/13/2017, 06/02/2019, 09/09/2020   Influenza, High Dose Seasonal PF 06/11/2017, 06/28/2018   Influenza-Unspecified 06/21/2020, 07/25/2021   Moderna SARS-COV2 Booster Vaccination 03/08/2021   Moderna Sars-Covid-2 Vaccination 10/05/2019, 11/02/2019, 08/15/2020   PFIZER(Purple Top)SARS-COV-2 Vaccination 06/21/2021   Pfizer Covid-19 Vaccine Bivalent Booster 38yr & up 08/07/2022   Pneumococcal Conjugate-13 11/22/2014   Pneumococcal Polysaccharide-23 04/29/2009, 08/13/2017   Td 01/24/2013   Tdap 12/28/2017, 07/01/2018, 12/13/2020   Zoster Recombinat (Shingrix) 07/01/2018   Zoster, Live 11/12/2009, 07/01/2018   Zoster, Unspecified 12/17/2005   Pertinent  Health Maintenance Due  Topic Date Due   INFLUENZA VACCINE  Completed   DEXA SCAN  Completed      05/29/2022    1:34 PM 06/26/2022    1:30 PM 07/18/2022    1:38 PM 08/31/2022   12:04 PM 09/21/2022    2:18 PM  Fall Risk  Falls in the past year?    0   Was there an injury with Fall?    0   Fall Risk Category Calculator    0   Fall Risk Category    Low   Patient Fall Risk Level High fall risk High fall risk High fall risk Low fall risk High fall risk  Patient at Risk for Falls Due to    History of fall(s);Impaired balance/gait   Fall risk Follow up    Falls evaluation completed;Education provided    Functional Status Survey:    Vitals:   09/28/22 1107  BP: (!) 114/56  Pulse: 94  Resp: 16  Temp: (!) 96.1 F (35.6 C)  SpO2: 98%  Weight: 118 lb 6.4 oz (53.7 kg)  Height: '5\' 6"'$  (1.676 m)   Body mass index is 19.11 kg/m. Physical  Exam Vitals and nursing note reviewed.  Constitutional:      Appearance: Normal appearance.  HENT:     Nose: Nose normal.     Mouth/Throat:     Mouth: Mucous membranes are moist.  Eyes:     Extraocular Movements: Extraocular movements intact.     Conjunctiva/sclera: Conjunctivae normal.     Pupils: Pupils are equal, round, and reactive to light.  Cardiovascular:     Rate and Rhythm: Normal rate and regular rhythm.     Heart sounds: Murmur heard.  Pulmonary:     Effort: Pulmonary effort is normal.     Breath sounds: Normal breath sounds. No wheezing, rhonchi or rales.  Abdominal:     General: Bowel sounds are normal.     Palpations: Abdomen is soft.     Tenderness: There is no abdominal tenderness.  Musculoskeletal:     Cervical back: Normal range of motion and neck supple.     Right lower leg: Edema present.     Left lower leg: Edema present.     Comments: Trace edema BLE. Chronic R hip to R ankle pain, R SIJ region intermittent.   Skin:    General: Skin is warm and dry.     Comments: Mild pigmented skin changes BLE  Neurological:     General: No focal deficit present.     Mental Status: She is alert and oriented to person, place, and time. Mental status is at baseline.     Gait: Gait abnormal.  Psychiatric:        Mood and Affect: Mood normal.        Behavior: Behavior normal.        Thought Content: Thought content normal.        Judgment: Judgment normal.     Labs reviewed: Recent Labs    08/08/22 0956 08/31/22 1533 09/21/22 1358  NA 139 133* 139  K 4.8 4.9 5.0  CL 106 100 107  CO2 22 21* 19*  GLUCOSE 124* 97 105*  BUN 56* 65* 73*  CREATININE 2.77* 2.77* 3.33*  CALCIUM 9.4 9.1 8.6*   Recent Labs    08/08/22 0956 08/31/22 1533 09/21/22 1358  AST 14* 12* 10*  ALT '8 8 7  '$ ALKPHOS 81 97 75  BILITOT 0.5 0.3 0.3  PROT 7.2 6.6 5.9*  ALBUMIN 2.6* 2.5* 2.5*   Recent Labs    08/08/22 0956 08/31/22 1533 09/21/22 1358  WBC 17.5* 15.4* 3.2*  NEUTROABS  5.0 8.5* 1.4*  HGB 10.4* 10.4* 10.1*  HCT 32.6* 32.8* 31.7*  MCV 106.9* 107.5* 102.3*  PLT 219 236 127*   Lab Results  Component Value Date   TSH 0.15 (A) 08/03/2022   No results found for: "HGBA1C" No results found for: "CHOL", "HDL", "LDLCALC", "LDLDIRECT", "TRIG", "CHOLHDL"  Significant Diagnostic Results in last 30 days:  No results found.  Assessment/Plan Hypothyroidism low TSH 0.02 09/27/22, Levothyroxine dose was reduced to 139mg qd 2/2 TSH 0.15 2 months ago, will taper down Levothyroxine 777m/100mcg, repeat TSH 8 weeks.   Migraines off Propranolol, on Gabapentin  HTN (hypertension)  recommended MAP range 70-90, off Propranolol  Trigeminal neuralgia Managed,  takes  Gabapentin.   Cerebellar infarct (HCC) Chronic left cerebellar infarct, hx of, no apparent focal residual.   Lymphoma, marginal zone, lymph nodes of multiple sites (HCC)  Marginal zone lymphoma, f/u oncology, on Calquence, Famciclovir  CKD (chronic kidney disease) stage 3, GFR 30-59 ml/min (HCC) Bun/creat 73/3.33 09/21/22  Anemia in neoplastic disease Receiving EPO, Hgb 10.1  GERD (gastroesophageal reflux disease) taking Pantoprazole     Family/ staff Communication: plan of care reviewed with the patient and charge nurse.   Labs/tests ordered:  TSH 8 weeks  Time spend 40 minutes.

## 2022-09-28 NOTE — Assessment & Plan Note (Signed)
Marginal zone lymphoma, f/u oncology, on Calquence, Famciclovir

## 2022-09-28 NOTE — ED Notes (Signed)
Pt reported mild nausea. Zofran IVP given. Will continue to monitor.

## 2022-10-02 ENCOUNTER — Encounter: Payer: Self-pay | Admitting: Nurse Practitioner

## 2022-10-02 ENCOUNTER — Non-Acute Institutional Stay: Payer: Medicare Other | Admitting: Nurse Practitioner

## 2022-10-02 DIAGNOSIS — L89301 Pressure ulcer of unspecified buttock, stage 1: Secondary | ICD-10-CM

## 2022-10-02 DIAGNOSIS — D630 Anemia in neoplastic disease: Secondary | ICD-10-CM

## 2022-10-02 DIAGNOSIS — R600 Localized edema: Secondary | ICD-10-CM

## 2022-10-02 DIAGNOSIS — E039 Hypothyroidism, unspecified: Secondary | ICD-10-CM

## 2022-10-02 DIAGNOSIS — Z8572 Personal history of non-Hodgkin lymphomas: Secondary | ICD-10-CM

## 2022-10-02 DIAGNOSIS — K219 Gastro-esophageal reflux disease without esophagitis: Secondary | ICD-10-CM

## 2022-10-02 DIAGNOSIS — R269 Unspecified abnormalities of gait and mobility: Secondary | ICD-10-CM

## 2022-10-02 DIAGNOSIS — I639 Cerebral infarction, unspecified: Secondary | ICD-10-CM

## 2022-10-02 DIAGNOSIS — S42401A Unspecified fracture of lower end of right humerus, initial encounter for closed fracture: Secondary | ICD-10-CM | POA: Insufficient documentation

## 2022-10-02 DIAGNOSIS — N1832 Chronic kidney disease, stage 3b: Secondary | ICD-10-CM

## 2022-10-02 DIAGNOSIS — M810 Age-related osteoporosis without current pathological fracture: Secondary | ICD-10-CM

## 2022-10-02 DIAGNOSIS — E871 Hypo-osmolality and hyponatremia: Secondary | ICD-10-CM

## 2022-10-02 DIAGNOSIS — G43001 Migraine without aura, not intractable, with status migrainosus: Secondary | ICD-10-CM

## 2022-10-02 DIAGNOSIS — I10 Essential (primary) hypertension: Secondary | ICD-10-CM

## 2022-10-02 DIAGNOSIS — S42491D Other displaced fracture of lower end of right humerus, subsequent encounter for fracture with routine healing: Secondary | ICD-10-CM

## 2022-10-02 NOTE — Assessment & Plan Note (Signed)
Bun/creat 68/3.13 09/28/22

## 2022-10-02 NOTE — Assessment & Plan Note (Signed)
Gait abnormality, uses Walker. Frequent falls, needs cueing for safety awareness

## 2022-10-02 NOTE — Assessment & Plan Note (Signed)
EPO, Hgb 11.4 09/28/22

## 2022-10-02 NOTE — Assessment & Plan Note (Signed)
recommended MAP range 70-90, off Propranolol

## 2022-10-02 NOTE — Assessment & Plan Note (Signed)
Marginal zone lymphoma, f/u oncology, on Calquence, Famciclovir

## 2022-10-02 NOTE — Assessment & Plan Note (Signed)
Na 132 09/28/22, at her baseline.

## 2022-10-02 NOTE — Assessment & Plan Note (Addendum)
fall when attempted picking up her jeans on the floor, resulted right forehead contusion, closed displaced fracture of distal end of right humerus, in splint, f/u Ortho.   Hx of lower back pain, already takes Robaxin, Tramadol, Gabapentin.

## 2022-10-02 NOTE — Assessment & Plan Note (Signed)
Chronic left cerebellar infarct, hx of, no apparent focal residual.

## 2022-10-02 NOTE — Assessment & Plan Note (Signed)
Levothyroxine dose was reduced to 156mg qd 2/2 TSH 0.15 2 months ago, then reduced to 74m 09/28/22 2/2 TSH 0.02 09/27/22

## 2022-10-02 NOTE — Assessment & Plan Note (Signed)
off Propranolol, on Gabapentin

## 2022-10-02 NOTE — Assessment & Plan Note (Signed)
R+L buttock nonblanchable redness. A scar from previous pressure ulcer left buttock present. Frequent repositioning, barrier ointment prn.

## 2022-10-02 NOTE — Assessment & Plan Note (Signed)
Not apparent today, prn  Torsemide

## 2022-10-02 NOTE — Progress Notes (Unsigned)
Location:   [facility] Room Number: 9-A Place of Service:  ALF (13) Provider: Lennie Odor Bence Trapp NP  Virgie Dad, MD  Patient Care Team: Virgie Dad, MD as PCP - General (Internal Medicine)  Extended Emergency Contact Information Primary Emergency Contact: Genia Plants Address: [address]          [place], [place] [address] Johnnette Litter of [place] Phone: [phone] Relation: Alanson Puls Secondary Emergency Contact: [name] Address: [address]          [place],  [address] Johnnette Litter of [place] Phone: [phone] Mobile Phone: [phone] Relation: Nephew  Code Status:  Full Code Goals of care: Advanced Directive information    10/02/2022   10:50 AM  Advanced Directives  Does Patient Have a Medical Advance Directive? Yes  Type of Paramedic of [place];Living will  Does patient want to make changes to medical advance directive? No - Patient declined  Copy of Elsie in Chart? Yes - validated most recent copy scanned in chart (See row information)     Chief Complaint  Patient presents with   Acute Visit    ED follow-up evaluation for fall     HPI:  Pt is a 89 y.o. female seen today for an acute visit for f/u ED eval for  fall when attempted picking up her jeans on the floor, resulted right forehead contusion, closed displaced fracture of distal end of right humerus, in splint, f/u Ortho.   R+L buttock nonblanchable redness.   Hyponatremia, Na 132 09/28/22  Hypothyroidism, Levothyroxine dose was reduced to 127mg qd 2/2 TSH 0.15 2 months ago, then reduced to 766m 09/28/22 2/2 TSH 0.02 09/27/22             Edema BLE, prn  Torsemide              Marginal zone lymphoma, f/u oncology, on Calquence, Famciclovir             Chronic left cerebellar infarct, hx of, no apparent focal residual.              Gait abnormality, uses Walker.              Frequent falls             CKD, Bun/creat  68/3.13 09/28/22             Anemia, EPO, Hgb 11.4 09/28/22             OP DEXA 02/25/20 tscore -3.8             Migraines, off Propranolol, on Gabapentin             HTN recommended MAP range 70-90, off Propranolol             Trigeminal neuralgia, takes  Gabapentin.              GERD, taking Pantoprazole             Lower back pain, takes Robaxin, Tramadol, Gabapentin.                  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)    Per [facility]   History of B-cell lymphoma 11/02/2015   History of falling    [place]   Hypothyroidism, unspecified    per [facility]   Lymphedema, not elsewhere classified    Per [facility]   Migraines    Muscle weakness (generalized)    [place]   Neuralgia and neuritis, unspecified    [place]   Nontraumatic subarachnoid hemorrhage, unspecified ([place])    per [facility]   Osteoporosis    Pain in right shoulder    Tachycardia, unspecified    [place]   Thyroid disease    Trigeminal neuralgia of left side of face    Unsteadiness on feet    [facility]   Past Surgical History:  Procedure Laterality Date   gamma knife     for trigeminal neuralgia   SHOULDER SURGERY     Right shoulder replacement   TONSILLECTOMY AND ADENOIDECTOMY     WISDOM TOOTH EXTRACTION      Allergies  Allergen Reactions   Penicillins Hives   Tegretol [Carbamazepine] Hives and Other (See Comments)    Turned purple from neck down and drowsy    Lyrica [Pregabalin] Other (See Comments)    Drowsy    Allergies as of 10/02/2022       Reactions   Penicillins Hives   Tegretol [carbamazepine] Hives, Other (See Comments)   Turned purple from neck down and drowsy   Lyrica  [pregabalin] Other (See Comments)   Drowsy        Medication List        Accurate as of October 02, 2022 11:59 PM. If you have any questions, ask your nurse or doctor.          STOP taking these medications    white petrolatum ointment Stopped by: Yailin Biederman X Takashi Korol, NP       TAKE these medications    acetaminophen 500 MG tablet Commonly known as: TYLENOL Take 500 mg by mouth every 6 (six) hours as needed. Give 1 tablet by mouth three times a day for pain   AQUAPHOR OINTMENT BODY EX Apply topically. 517OH Give 1 application by mouth at bedtime for dry skin   aspirin 81 MG chewable tablet Chew 81 mg by mouth daily.   Calquence 100 MG tablet Generic drug: acalabrutinib maleate Take 1 tablet (100 mg total) by mouth daily.   cyanocobalamin 1000 MCG tablet Commonly known as: VITAMIN B12 Take 1 tablet (1,000 mcg total) by mouth daily.   famciclovir 125 MG tablet Commonly known as: FAMVIR Take 250 mg by mouth daily. Two tablets   gabapentin 600 MG tablet Commonly known as: NEURONTIN Take 600 mg by mouth 3 (three) times daily.   Imodium A-D 2 MG capsule Generic drug: loperamide Take 4 mg by mouth every 8 (eight) hours as needed for diarrhea or loose stools.   lactose free nutrition Liqd Take 237 mLs by mouth daily.   levothyroxine 75 MCG tablet Commonly known as: SYNTHROID Take 75 mcg by mouth daily before breakfast. What changed: Another medication with the same name was removed. Continue taking this medication, and follow the directions you see here. Changed by: Carly Applegate X Tayvion Lauder, NP   methocarbamol 500 MG tablet Commonly known as: ROBAXIN Take 0.5 tablets (250 mg total) by mouth 3 (three) times daily as needed for muscle spasms.   MiraLax 17 GM/SCOOP powder Generic drug: polyethylene glycol powder Give 17 gram  by mouth as needed for Bowel Management   Multi-Vitamins Tabs Take 1 tablet by mouth daily.   pantoprazole 40 MG tablet Commonly known as: PROTONIX Take  40 mg by mouth 2 (two) times daily.   polyvinyl alcohol 1.4 % ophthalmic solution Commonly known as: LIQUIFILM TEARS Place 1 drop into both eyes 4 (four) times daily as needed.   Pro-Stat Liqd Take 30 mLs by mouth daily.   simethicone 125 MG chewable tablet Commonly known as: MYLICON Chew 841 mg by mouth 2 (two) times daily as needed for flatulence.   torsemide 10 MG tablet Commonly known as: DEMADEX 10 mg daily as needed.   traMADol 50 MG tablet Commonly known as: ULTRAM Take 25 mg by mouth 3 (three) times daily as needed.   zinc oxide 20 % ointment Apply 1 application. topically as needed for irritation. To buttocks after every incontinent episode and as needed for redness. May keep at bedside.        Review of Systems  Constitutional:  Positive for fatigue. Negative for appetite change and fever.  HENT:  Positive for hearing loss. Negative for congestion and trouble swallowing.   Eyes:  Negative for visual disturbance.  Respiratory:  Negative for cough and shortness of breath.   Cardiovascular:  Positive for leg swelling. Negative for palpitations.  Gastrointestinal:  Negative for abdominal pain and constipation.  Genitourinary:  Negative for dysuria, frequency and urgency.       Bartholin cysts per history  Musculoskeletal:  Positive for arthralgias, back pain, gait problem and joint swelling.       Ambulates with walker. C/o R hip to R ankle pain, more at night, less when she is up/walking but she felt it. Right arm pain.   Skin:  Positive for wound.  Neurological:  Negative for speech difficulty, weakness and headaches.  Psychiatric/Behavioral:  Negative for confusion and sleep disturbance. The patient is not nervous/anxious.     Immunization History  Administered Date(s) Administered   Fluad Quad(high Dose 65+) 05/28/2019, 07/25/2022   Influenza Split 07/12/2016, 08/13/2017, 06/02/2019, 09/09/2020   Influenza, High Dose Seasonal PF 06/11/2017, 06/28/2018    Influenza-Unspecified 06/21/2020, 07/25/2021   Moderna SARS-COV2 Booster Vaccination 03/08/2021   Moderna Sars-Covid-2 Vaccination 10/05/2019, 11/02/2019, 08/15/2020   PFIZER(Purple Top)SARS-COV-2 Vaccination 06/21/2021   Pfizer Covid-19 Vaccine Bivalent Booster 83yr & up 08/07/2022   Pneumococcal Conjugate-13 11/22/2014   Pneumococcal Polysaccharide-23 04/29/2009, 08/13/2017   Td 01/24/2013   Tdap 12/28/2017, 07/01/2018, 12/13/2020   Zoster Recombinat (Shingrix) 07/01/2018   Zoster, Live 11/12/2009, 07/01/2018   Zoster, Unspecified 12/17/2005   Pertinent  Health Maintenance Due  Topic Date Due   INFLUENZA VACCINE  Completed   DEXA SCAN  Completed      06/26/2022    1:30 PM 07/18/2022    1:38 PM 08/31/2022   12:04 PM 09/21/2022    2:18 PM 09/28/2022    4:34 PM  Fall Risk  Falls in the past year?   0    Was there an injury with Fall?   0    Fall Risk Category Calculator   0    Fall Risk Category   Low    Patient Fall Risk Level High fall risk High fall risk Low fall risk High fall risk High fall risk  Patient at Risk for Falls Due to   History of fall(s);Impaired balance/gait    Fall risk Follow up   Falls evaluation completed;Education provided     Functional Status Survey:    Vitals:  10/02/22 1039  BP: (!) 114/56  Pulse: 94  Resp: 16  Temp: (!) 96.1 F (35.6 C)  SpO2: 98%  Weight: 118 lb 6.4 oz (53.7 kg)  Height: '5\' 6"'$  (1.676 m)   Body mass index is 19.11 kg/m. Physical Exam Vitals and nursing note reviewed.  Constitutional:      Appearance: Normal appearance.  HENT:     Nose: Nose normal.     Mouth/Throat:     Mouth: Mucous membranes are moist.  Eyes:     Extraocular Movements: Extraocular movements intact.     Conjunctiva/sclera: Conjunctivae normal.     Pupils: Pupils are equal, round, and reactive to light.  Cardiovascular:     Rate and Rhythm: Normal rate and regular rhythm.     Heart sounds: Murmur heard.  Pulmonary:     Effort: Pulmonary  effort is normal.     Breath sounds: Normal breath sounds. No wheezing, rhonchi or rales.  Abdominal:     General: Bowel sounds are normal.     Palpations: Abdomen is soft.     Tenderness: There is no abdominal tenderness.  Musculoskeletal:     Cervical back: Normal range of motion and neck supple.     Right lower leg: Edema present.     Left lower leg: Edema present.     Comments: Minimal edema BLE. Chronic R hip to R ankle pain, R SIJ region intermittent. R elbow pain, in long splint, left hand swelling, able to make a fist, denied tingling or numbness.   Skin:    General: Skin is warm and dry.     Comments: Mild pigmented skin changes BLE  Neurological:     General: No focal deficit present.     Mental Status: She is alert and oriented to person, place, and time. Mental status is at baseline.     Gait: Gait abnormal.  Psychiatric:        Mood and Affect: Mood normal.        Behavior: Behavior normal.        Thought Content: Thought content normal.        Judgment: Judgment normal.     Labs reviewed: Recent Labs    08/31/22 1533 09/21/22 1358 09/28/22 1700  NA 133* 139 132*  K 4.9 5.0 5.2*  CL 100 107 106  CO2 21* 19* 17*  GLUCOSE 97 105* 118*  BUN 65* 73* 68*  CREATININE 2.77* 3.33* 3.13*  CALCIUM 9.1 8.6* 7.9*   Recent Labs    08/31/22 1533 09/21/22 1358 09/28/22 1700  AST 12* 10* 145*  ALT 8 7 62*  ALKPHOS 97 75 84  BILITOT 0.3 0.3 0.3  PROT 6.6 5.9* 5.6*  ALBUMIN 2.5* 2.5* 1.6*   Recent Labs    08/31/22 1533 09/21/22 1358 09/28/22 1700  WBC 15.4* 3.2* 3.1*  NEUTROABS 8.5* 1.4* 2.0  HGB 10.4* 10.1* 11.4*  HCT 32.8* 31.7* 35.7*  MCV 107.5* 102.3* 103.2*  PLT 236 127* 175   Lab Results  Component Value Date   TSH 0.15 (A) 08/03/2022   No results found for: "HGBA1C" No results found for: "CHOL", "HDL", "LDLCALC", "LDLDIRECT", "TRIG", "CHOLHDL"  Significant Diagnostic Results in last 30 days:  DG Humerus Right  Result Date:  09/28/2022 CLINICAL DATA:  fall, obvious deformity EXAM: RIGHT ELBOW - COMPLETE 3+ VIEW; RIGHT HUMERUS - 2+ VIEW COMPARISON:  X-ray right shoulder 09/03/2016 FINDINGS: Right humerus: Acute comminuted and displaced spiral fracture of the distal humeral shaft distal to  a total right shoulder arthroplasty. No radiographic findings suggest surgical hardware complication. No periprosthetic lucency. Right elbow: No evidence of fracture, dislocation, or joint effusion. No evidence of severe arthropathy. No aggressive appearing focal bone abnormality. Soft tissues are unremarkable. IMPRESSION: 1. Acute comminuted and displaced spiral fracture of the distal humeral shaft distal to a total right shoulder arthroplasty. 2. No acute displaced fracture or dislocation of the bones of the right elbow. Electronically Signed   By: Iven Finn M.D.   On: 09/28/2022 17:50   DG Elbow Complete Right  Result Date: 09/28/2022 CLINICAL DATA:  fall, obvious deformity EXAM: RIGHT ELBOW - COMPLETE 3+ VIEW; RIGHT HUMERUS - 2+ VIEW COMPARISON:  X-ray right shoulder 09/03/2016 FINDINGS: Right humerus: Acute comminuted and displaced spiral fracture of the distal humeral shaft distal to a total right shoulder arthroplasty. No radiographic findings suggest surgical hardware complication. No periprosthetic lucency. Right elbow: No evidence of fracture, dislocation, or joint effusion. No evidence of severe arthropathy. No aggressive appearing focal bone abnormality. Soft tissues are unremarkable. IMPRESSION: 1. Acute comminuted and displaced spiral fracture of the distal humeral shaft distal to a total right shoulder arthroplasty. 2. No acute displaced fracture or dislocation of the bones of the right elbow. Electronically Signed   By: Iven Finn M.D.   On: 09/28/2022 17:50   CT Head Wo Contrast  Result Date: 09/28/2022 CLINICAL DATA:  Head trauma, moderate-severe; Neck trauma (Age >= 65y) EXAM: CT HEAD WITHOUT CONTRAST CT CERVICAL  SPINE WITHOUT CONTRAST TECHNIQUE: Multidetector CT imaging of the head and cervical spine was performed following the standard protocol without intravenous contrast. Multiplanar CT image reconstructions of the cervical spine were also generated. RADIATION DOSE REDUCTION: This exam was performed according to the departmental dose-optimization program which includes automated exposure control, adjustment of the mA and/or kV according to patient size and/or use of iterative reconstruction technique. COMPARISON:  None Available. FINDINGS: CT HEAD FINDINGS Brain: Cerebral ventricle sizes are concordant with the degree of cerebral volume loss. Patchy and confluent areas of decreased attenuation are noted throughout the deep and periventricular white matter of the cerebral hemispheres bilaterally, compatible with chronic microvascular ischemic disease. No evidence of large-territorial acute infarction. No parenchymal hemorrhage. No mass lesion. No extra-axial collection. No mass effect or midline shift. No hydrocephalus. Basilar cisterns are patent. Vascular: No hyperdense vessel. Skull: No acute fracture or focal lesion. Sinuses/Orbits: Paranasal sinuses and mastoid air cells are clear. Bilateral lens replacement. Otherwise the orbits are unremarkable. Other: 3 mm right frontal scalp hematoma formation. CT CERVICAL SPINE FINDINGS Alignment: Reversal normal cervical lordosis at the C6 level likely due to positioning and degenerative changes. Grade 1 anterolisthesis C2 on C3, C3 on C4, C4 on C5, C5 on C6. Mild retrolisthesis of C6 on C7. Skull base and vertebrae: Multilevel severe degenerative changes most prominent at the C6-C7 level. Associated multilevel moderate to severe osseous neural foraminal stenosis. No associated severe osseous central canal stenosis. No acute fracture. No aggressive appearing focal osseous lesion or focal pathologic process. Soft tissues and spinal canal: No prevertebral fluid or swelling. No  visible canal hematoma. Upper chest: Biapical pleural/pulmonary scarring. Other: Atherosclerotic plaque of the carotid arteries within the neck. IMPRESSION: 1. No acute intracranial abnormality. 2. No acute displaced fracture or traumatic listhesis of the cervical spine. Electronically Signed   By: Iven Finn M.D.   On: 09/28/2022 17:42   CT Cervical Spine Wo Contrast  Result Date: 09/28/2022 CLINICAL DATA:  Head trauma, moderate-severe; Neck trauma (Age >=  65y) EXAM: CT HEAD WITHOUT CONTRAST CT CERVICAL SPINE WITHOUT CONTRAST TECHNIQUE: Multidetector CT imaging of the head and cervical spine was performed following the standard protocol without intravenous contrast. Multiplanar CT image reconstructions of the cervical spine were also generated. RADIATION DOSE REDUCTION: This exam was performed according to the departmental dose-optimization program which includes automated exposure control, adjustment of the mA and/or kV according to patient size and/or use of iterative reconstruction technique. COMPARISON:  None Available. FINDINGS: CT HEAD FINDINGS Brain: Cerebral ventricle sizes are concordant with the degree of cerebral volume loss. Patchy and confluent areas of decreased attenuation are noted throughout the deep and periventricular white matter of the cerebral hemispheres bilaterally, compatible with chronic microvascular ischemic disease. No evidence of large-territorial acute infarction. No parenchymal hemorrhage. No mass lesion. No extra-axial collection. No mass effect or midline shift. No hydrocephalus. Basilar cisterns are patent. Vascular: No hyperdense vessel. Skull: No acute fracture or focal lesion. Sinuses/Orbits: Paranasal sinuses and mastoid air cells are clear. Bilateral lens replacement. Otherwise the orbits are unremarkable. Other: 3 mm right frontal scalp hematoma formation. CT CERVICAL SPINE FINDINGS Alignment: Reversal normal cervical lordosis at the C6 level likely due to  positioning and degenerative changes. Grade 1 anterolisthesis C2 on C3, C3 on C4, C4 on C5, C5 on C6. Mild retrolisthesis of C6 on C7. Skull base and vertebrae: Multilevel severe degenerative changes most prominent at the C6-C7 level. Associated multilevel moderate to severe osseous neural foraminal stenosis. No associated severe osseous central canal stenosis. No acute fracture. No aggressive appearing focal osseous lesion or focal pathologic process. Soft tissues and spinal canal: No prevertebral fluid or swelling. No visible canal hematoma. Upper chest: Biapical pleural/pulmonary scarring. Other: Atherosclerotic plaque of the carotid arteries within the neck. IMPRESSION: 1. No acute intracranial abnormality. 2. No acute displaced fracture or traumatic listhesis of the cervical spine. Electronically Signed   By: Iven Finn M.D.   On: 09/28/2022 17:42    Assessment/Plan Fracture of distal end of right humerus fall when attempted picking up her jeans on the floor, resulted right forehead contusion, closed displaced fracture of distal end of right humerus, in splint, f/u Ortho.   Hx of lower back pain, already takes Robaxin, Tramadol, Gabapentin.   Pressure ulcer, stage 1 R+L buttock nonblanchable redness. A scar from previous pressure ulcer left buttock present. Frequent repositioning, barrier ointment prn.   Hyponatremia Na 132 09/28/22, at her baseline.   Hypothyroidism  Levothyroxine dose was reduced to 130mg qd 2/2 TSH 0.15 2 months ago, then reduced to 758m 09/28/22 2/2 TSH 0.02 09/27/22  Bilateral leg edema Not apparent today, prn  Torsemide   History of B-cell lymphoma Marginal zone lymphoma, f/u oncology, on Calquence, Famciclovir  Cerebellar infarct ([facility]  Chronic left cerebellar infarct, hx of, no apparent focal residual.   Gait abnormality Gait abnormality, uses Walker. Frequent falls, needs cueing for safety awareness  CKD (chronic kidney disease) stage 3, GFR 30-59  ml/min (HCC)  Bun/creat 68/3.13 09/28/22  Anemia in neoplastic disease EPO, Hgb 11.4 09/28/22  Osteoporosis DEXA 02/25/20 tscore -3.8  Migraines off Propranolol, on Gabapentin  HTN (hypertension) recommended MAP range 70-90, off Propranolol  GERD (gastroesophageal reflux disease)  taking Pantoprazole    Family/ staff Communication: plan of care reviewed with the patient and charge nurse.   Labs/tests ordered:  none  Time spend 40 minutes.

## 2022-10-02 NOTE — Assessment & Plan Note (Signed)
DEXA 02/25/20 tscore -3.8

## 2022-10-02 NOTE — Assessment & Plan Note (Signed)
taking Pantoprazole

## 2022-10-03 DIAGNOSIS — S42309A Unspecified fracture of shaft of humerus, unspecified arm, initial encounter for closed fracture: Secondary | ICD-10-CM | POA: Insufficient documentation

## 2022-10-04 ENCOUNTER — Encounter: Payer: Self-pay | Admitting: Nurse Practitioner

## 2022-10-04 LAB — COMPREHENSIVE METABOLIC PANEL: eGFR: 13

## 2022-10-04 LAB — TSH: TSH: 0.02 — AB (ref 0.41–5.90)

## 2022-10-04 LAB — BASIC METABOLIC PANEL: Creatinine: 3.3 — AB (ref 0.5–1.1)

## 2022-10-10 ENCOUNTER — Inpatient Hospital Stay: Payer: Medicare Other

## 2022-10-10 ENCOUNTER — Inpatient Hospital Stay: Payer: Medicare Other | Admitting: Family

## 2022-10-11 ENCOUNTER — Encounter: Payer: Self-pay | Admitting: Internal Medicine

## 2022-10-11 ENCOUNTER — Non-Acute Institutional Stay: Payer: Medicare Other | Admitting: Internal Medicine

## 2022-10-11 DIAGNOSIS — N184 Chronic kidney disease, stage 4 (severe): Secondary | ICD-10-CM

## 2022-10-11 DIAGNOSIS — Z8572 Personal history of non-Hodgkin lymphomas: Secondary | ICD-10-CM | POA: Diagnosis not present

## 2022-10-11 DIAGNOSIS — D631 Anemia in chronic kidney disease: Secondary | ICD-10-CM

## 2022-10-11 DIAGNOSIS — E039 Hypothyroidism, unspecified: Secondary | ICD-10-CM | POA: Diagnosis not present

## 2022-10-11 DIAGNOSIS — S42491D Other displaced fracture of lower end of right humerus, subsequent encounter for fracture with routine healing: Secondary | ICD-10-CM | POA: Diagnosis not present

## 2022-10-11 NOTE — Progress Notes (Signed)
Quick abstraction

## 2022-10-11 NOTE — Progress Notes (Signed)
Location:  [facility] Room Number: [phone] Place of Service:  ALF [phone]) Provider:  Virgie Dad, MD   Virgie Dad, MD  Patient Care Team: Virgie Dad, MD as PCP - General (Internal Medicine)  Extended Emergency Contact Information Primary Emergency Contact: Genia Plants Address: [address]          [place], [place] [address] Johnnette Litter of [place] Phone: [phone] Relation: Alanson Puls Secondary Emergency Contact: [name] Address: [address]          [place], [place] [address] Johnnette Litter of [place] Phone: [phone] Mobile Phone: [phone] Relation: Nephew  Code Status:  DNR Goals of care: Advanced Directive information    10/02/2022   10:50 AM  Advanced Directives  Does Patient Have a Medical Advance Directive? Yes  Type of Paramedic of [place];Living will  Does patient want to make changes to medical advance directive? No - Patient declined  Copy of [place] in Chart? Yes - validated most recent copy scanned in chart (See row information)     Chief Complaint  Patient presents with   Acute Visit    HPI:  Pt is a 89 y.o. 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  osteoporosis,   hypothyroidism,  trigeminal neuralgia and migraines   H/o  frontoparietal subarachnoid hemorrhage and Rib fractures LE edema mild  Patient had mechanical fall on 09/28/22 Sustained Right Humeral fracture Now in [place]  On 12/22 patient was also found ot have Low WBC and Worsening Creat Calquence stopped    Patient continue sot feel very weak and now not doing her ADLS Mod assist with her transfers Pain Seems controlled Able to walk with therapy some but gets tired easily Denies SOB, Dizziness or Chest pain No Fever or dysuria Eating well   Past  Medical History:  Diagnosis Date   Abnormal posture    [place]   Cancer ([place])    non hodgkin lymphoma   Chronic lymphocytic leukemia ([place])    Cognitive communication deficit    [place]   Dorsalgia, unspecified    per [facility]   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   Nontraumatic subarachnoid hemorrhage, unspecified ([place])    per [facility]   Osteoporosis    Pain in right shoulder    Tachycardia, unspecified    [place]   Thyroid disease    Trigeminal neuralgia of left side of face    Unsteadiness on feet    [facility]   Past Surgical History:  Procedure Laterality Date   gamma knife     for trigeminal neuralgia   SHOULDER SURGERY     Right shoulder replacement   TONSILLECTOMY AND ADENOIDECTOMY     WISDOM TOOTH EXTRACTION      Allergies  Allergen Reactions   Penicillins Hives   Tegretol [  Carbamazepine] Hives and Other (See Comments)    Turned purple from neck down and drowsy    Lyrica [Pregabalin] Other (See Comments)    Drowsy    Outpatient Encounter Medications as of 10/11/2022  Medication Sig   acetaminophen (TYLENOL) 500 MG tablet Take 500 mg by mouth every 6 (six) hours as needed. Give 1 tablet by mouth three times a day for pain   Amino Acids-Protein Hydrolys (PRO-STAT) LIQD Take 30 mLs by mouth daily.   aspirin 81 MG chewable tablet Chew 81 mg by mouth daily.   Emollient (AQUAPHOR OINTMENT BODY EX) Apply topically. 518AC Give 1 application by mouth at bedtime for dry skin   famciclovir (FAMVIR) 125 MG tablet Take 250 mg by mouth daily. Two tablets   gabapentin (NEURONTIN) 600 MG tablet Take 600 mg  by mouth 3 (three) times daily.   lactose free nutrition (BOOST) LIQD Take 237 mLs by mouth daily.   levothyroxine (SYNTHROID) 75 MCG tablet Take 75 mcg by mouth daily before breakfast.   loperamide (IMODIUM A-D) 2 MG capsule Take 4 mg by mouth every 8 (eight) hours as needed for diarrhea or loose stools.   methocarbamol (ROBAXIN) 500 MG tablet Take 0.5 tablets (250 mg total) by mouth 3 (three) times daily as needed for muscle spasms.   Multiple Vitamin (MULTI-VITAMINS) TABS Take 1 tablet by mouth daily.   pantoprazole (PROTONIX) 40 MG tablet Take 40 mg by mouth 2 (two) times daily.   polyethylene glycol powder (MIRALAX) 17 GM/SCOOP powder Give 17 gram by mouth as needed for Bowel Management   polyvinyl alcohol (LIQUIFILM TEARS) 1.4 % ophthalmic solution Place 1 drop into both eyes 4 (four) times daily as needed.   simethicone (MYLICON) 166 MG chewable tablet Chew 125 mg by mouth 2 (two) times daily as needed for flatulence.   torsemide (DEMADEX) 10 MG tablet 10 mg daily as needed.   traMADol (ULTRAM) 50 MG tablet Take 25 mg by mouth 3 (three) times daily as needed.   vitamin B-12 (CYANOCOBALAMIN) 1000 MCG tablet Take 1 tablet (1,000 mcg total) by mouth daily.   zinc oxide 20 % ointment Apply 1 application. topically as needed for irritation. To buttocks after every incontinent episode and as needed for redness. May keep at bedside.   acalabrutinib maleate (CALQUENCE) 100 MG tablet Take 1 tablet (100 mg total) by mouth daily. (Patient not taking: Reported on 10/02/2022)   Facility-Administered Encounter Medications as of 10/11/2022  Medication   epoetin alfa-epbx (RETACRIT) injection 40,000 Units    Review of Systems  Constitutional:  Negative for activity change and appetite change.  HENT: Negative.    Respiratory:  Negative for cough and shortness of breath.   Cardiovascular:  Negative for leg swelling.  Gastrointestinal:  Negative for constipation.  Genitourinary: Negative.    Musculoskeletal:  Positive for gait problem. Negative for arthralgias and myalgias.  Skin: Negative.   Neurological:  Positive for weakness. Negative for dizziness.  Psychiatric/Behavioral:  Negative for confusion, dysphoric mood and sleep disturbance.     Immunization History  Administered Date(s) Administered   Fluad Quad(high Dose 65+) 05/28/2019, 07/25/2022   Influenza Split 07/12/2016, 08/13/2017, 06/02/2019, 09/09/2020   Influenza, High Dose Seasonal PF 06/11/2017, 06/28/2018   Influenza-Unspecified 06/21/2020, 07/25/2021   Moderna SARS-COV2 Booster Vaccination 03/08/2021   Moderna Sars-Covid-2 Vaccination 10/05/2019, 11/02/2019, 08/15/2020   PFIZER(Purple Top)SARS-COV-2 Vaccination 06/21/2021   Pfizer Covid-19 Vaccine Bivalent Booster 58yr & up 08/07/2022   Pneumococcal Conjugate-13 11/22/2014   Pneumococcal Polysaccharide-23 04/29/2009, 08/13/2017  Td 01/24/2013   Tdap 12/28/2017, 07/01/2018, 12/13/2020   Zoster Recombinat (Shingrix) 07/01/2018   Zoster, Live 11/12/2009, 07/01/2018   Zoster, Unspecified 12/17/2005   Pertinent  Health Maintenance Due  Topic Date Due   INFLUENZA VACCINE  Completed   DEXA SCAN  Completed      06/26/2022    1:30 PM 07/18/2022    1:38 PM 08/31/2022   12:04 PM 09/21/2022    2:18 PM 09/28/2022    4:34 PM  Fall Risk  Falls in the past year?   0    Was there an injury with Fall?   0    Fall Risk Category Calculator   0    Fall Risk Category   Low    Patient Fall Risk Level High fall risk High fall risk Low fall risk High fall risk High fall risk  Patient at Risk for Falls Due to   History of fall(s);Impaired balance/gait    Fall risk Follow up   Falls evaluation completed;Education provided     Functional Status Survey:    Vitals:   10/11/22 1644  BP: 108/60  Pulse: 94  Resp: 20  Temp: (!) 97.2 F (36.2 C)  TempSrc: Temporal  SpO2: 94%  Weight: 118 lb 6.4 oz (53.7 kg)  Height: '5\' 6"'$  (1.676 m)   Body mass index is 19.11  kg/m. Physical Exam Vitals reviewed.  Constitutional:      Appearance: Normal appearance.  HENT:     Head: Normocephalic.     Nose: Nose normal.     Mouth/Throat:     Mouth: Mucous membranes are moist.     Pharynx: Oropharynx is clear.  Eyes:     Pupils: Pupils are equal, round, and reactive to light.  Cardiovascular:     Rate and Rhythm: Normal rate and regular rhythm.     Pulses: Normal pulses.     Heart sounds: Normal heart sounds. No murmur heard. Pulmonary:     Effort: Pulmonary effort is normal.     Breath sounds: Normal breath sounds.  Abdominal:     General: Abdomen is flat. Bowel sounds are normal.     Palpations: Abdomen is soft.  Musculoskeletal:        General: No swelling.     Cervical back: Neck supple.  Skin:    General: Skin is warm.  Neurological:     General: No focal deficit present.     Mental Status: She is alert and oriented to person, place, and time.  Psychiatric:        Mood and Affect: Mood normal.        Thought Content: Thought content normal.     Labs reviewed: Recent Labs    08/31/22 1533 09/21/22 1358 09/28/22 1700  NA 133* 139 132*  K 4.9 5.0 5.2*  CL 100 107 106  CO2 21* 19* 17*  GLUCOSE 97 105* 118*  BUN 65* 73* 68*  CREATININE 2.77* 3.33* 3.13*  CALCIUM 9.1 8.6* 7.9*   Recent Labs    08/31/22 1533 09/21/22 1358 09/28/22 1700  AST 12* 10* 145*  ALT 8 7 62*  ALKPHOS 97 75 84  BILITOT 0.3 0.3 0.3  PROT 6.6 5.9* 5.6*  ALBUMIN 2.5* 2.5* 1.6*   Recent Labs    08/31/22 1533 09/21/22 1358 09/28/22 1700  WBC 15.4* 3.2* 3.1*  NEUTROABS 8.5* 1.4* 2.0  HGB 10.4* 10.1* 11.4*  HCT 32.8* 31.7* 35.7*  MCV 107.5* 102.3* 103.2*  PLT 236 127* 175  Lab Results  Component Value Date   TSH 0.15 (A) 08/03/2022   No results found for: "HGBA1C" No results found for: "CHOL", "HDL", "LDLCALC", "LDLDIRECT", "TRIG", "CHOLHDL"  Significant Diagnostic Results in last 30 days:  DG Humerus Right  Result Date:  09/28/2022 CLINICAL DATA:  fall, obvious deformity EXAM: RIGHT ELBOW - COMPLETE 3+ VIEW; RIGHT HUMERUS - 2+ VIEW COMPARISON:  X-ray right shoulder 09/03/2016 FINDINGS: Right humerus: Acute comminuted and displaced spiral fracture of the distal humeral shaft distal to a total right shoulder arthroplasty. No radiographic findings suggest surgical hardware complication. No periprosthetic lucency. Right elbow: No evidence of fracture, dislocation, or joint effusion. No evidence of severe arthropathy. No aggressive appearing focal bone abnormality. Soft tissues are unremarkable. IMPRESSION: 1. Acute comminuted and displaced spiral fracture of the distal humeral shaft distal to a total right shoulder arthroplasty. 2. No acute displaced fracture or dislocation of the bones of the right elbow. Electronically Signed   By: Iven Finn M.D.   On: 09/28/2022 17:50   DG Elbow Complete Right  Result Date: 09/28/2022 CLINICAL DATA:  fall, obvious deformity EXAM: RIGHT ELBOW - COMPLETE 3+ VIEW; RIGHT HUMERUS - 2+ VIEW COMPARISON:  X-ray right shoulder 09/03/2016 FINDINGS: Right humerus: Acute comminuted and displaced spiral fracture of the distal humeral shaft distal to a total right shoulder arthroplasty. No radiographic findings suggest surgical hardware complication. No periprosthetic lucency. Right elbow: No evidence of fracture, dislocation, or joint effusion. No evidence of severe arthropathy. No aggressive appearing focal bone abnormality. Soft tissues are unremarkable. IMPRESSION: 1. Acute comminuted and displaced spiral fracture of the distal humeral shaft distal to a total right shoulder arthroplasty. 2. No acute displaced fracture or dislocation of the bones of the right elbow. Electronically Signed   By: Iven Finn M.D.   On: 09/28/2022 17:50   CT Head Wo Contrast  Result Date: 09/28/2022 CLINICAL DATA:  Head trauma, moderate-severe; Neck trauma (Age >= 65y) EXAM: CT HEAD WITHOUT CONTRAST CT CERVICAL  SPINE WITHOUT CONTRAST TECHNIQUE: Multidetector CT imaging of the head and cervical spine was performed following the standard protocol without intravenous contrast. Multiplanar CT image reconstructions of the cervical spine were also generated. RADIATION DOSE REDUCTION: This exam was performed according to the departmental dose-optimization program which includes automated exposure control, adjustment of the mA and/or kV according to patient size and/or use of iterative reconstruction technique. COMPARISON:  None Available. FINDINGS: CT HEAD FINDINGS Brain: Cerebral ventricle sizes are concordant with the degree of cerebral volume loss. Patchy and confluent areas of decreased attenuation are noted throughout the deep and periventricular white matter of the cerebral hemispheres bilaterally, compatible with chronic microvascular ischemic disease. No evidence of large-territorial acute infarction. No parenchymal hemorrhage. No mass lesion. No extra-axial collection. No mass effect or midline shift. No hydrocephalus. Basilar cisterns are patent. Vascular: No hyperdense vessel. Skull: No acute fracture or focal lesion. Sinuses/Orbits: Paranasal sinuses and mastoid air cells are clear. Bilateral lens replacement. Otherwise the orbits are unremarkable. Other: 3 mm right frontal scalp hematoma formation. CT CERVICAL SPINE FINDINGS Alignment: Reversal normal cervical lordosis at the C6 level likely due to positioning and degenerative changes. Grade 1 anterolisthesis C2 on C3, C3 on C4, C4 on C5, C5 on C6. Mild retrolisthesis of C6 on C7. Skull base and vertebrae: Multilevel severe degenerative changes most prominent at the C6-C7 level. Associated multilevel moderate to severe osseous neural foraminal stenosis. No associated severe osseous central canal stenosis. No acute fracture. No aggressive appearing focal osseous lesion or  focal pathologic process. Soft tissues and spinal canal: No prevertebral fluid or swelling. No  visible canal hematoma. Upper chest: Biapical pleural/pulmonary scarring. Other: Atherosclerotic plaque of the carotid arteries within the neck. IMPRESSION: 1. No acute intracranial abnormality. 2. No acute displaced fracture or traumatic listhesis of the cervical spine. Electronically Signed   By: Iven Finn M.D.   On: 09/28/2022 17:42   CT Cervical Spine Wo Contrast  Result Date: 09/28/2022 CLINICAL DATA:  Head trauma, moderate-severe; Neck trauma (Age >= 65y) EXAM: CT HEAD WITHOUT CONTRAST CT CERVICAL SPINE WITHOUT CONTRAST TECHNIQUE: Multidetector CT imaging of the head and cervical spine was performed following the standard protocol without intravenous contrast. Multiplanar CT image reconstructions of the cervical spine were also generated. RADIATION DOSE REDUCTION: This exam was performed according to the departmental dose-optimization program which includes automated exposure control, adjustment of the mA and/or kV according to patient size and/or use of iterative reconstruction technique. COMPARISON:  None Available. FINDINGS: CT HEAD FINDINGS Brain: Cerebral ventricle sizes are concordant with the degree of cerebral volume loss. Patchy and confluent areas of decreased attenuation are noted throughout the deep and periventricular white matter of the cerebral hemispheres bilaterally, compatible with chronic microvascular ischemic disease. No evidence of large-territorial acute infarction. No parenchymal hemorrhage. No mass lesion. No extra-axial collection. No mass effect or midline shift. No hydrocephalus. Basilar cisterns are patent. Vascular: No hyperdense vessel. Skull: No acute fracture or focal lesion. Sinuses/Orbits: Paranasal sinuses and mastoid air cells are clear. Bilateral lens replacement. Otherwise the orbits are unremarkable. Other: 3 mm right frontal scalp hematoma formation. CT CERVICAL SPINE FINDINGS Alignment: Reversal normal cervical lordosis at the C6 level likely due to  positioning and degenerative changes. Grade 1 anterolisthesis C2 on C3, C3 on C4, C4 on C5, C5 on C6. Mild retrolisthesis of C6 on C7. Skull base and vertebrae: Multilevel severe degenerative changes most prominent at the C6-C7 level. Associated multilevel moderate to severe osseous neural foraminal stenosis. No associated severe osseous central canal stenosis. No acute fracture. No aggressive appearing focal osseous lesion or focal pathologic process. Soft tissues and spinal canal: No prevertebral fluid or swelling. No visible canal hematoma. Upper chest: Biapical pleural/pulmonary scarring. Other: Atherosclerotic plaque of the carotid arteries within the neck. IMPRESSION: 1. No acute intracranial abnormality. 2. No acute displaced fracture or traumatic listhesis of the cervical spine. Electronically Signed   By: Iven Finn M.D.   On: 09/28/2022 17:42    Assessment/Plan 1. Other closed displaced fracture of distal end of right humerus with routine healing, subsequent encounter Pain Seemed controlled Working with therapy But patient unable to do her ADLS Will Transfer to SNF She has agreed I have filled up FL2 talked to DON  2. CKD (chronic kidney disease) stage 4, GFR 15-29 ml/min (HCC) Sudden worsening ? Etiology Torsemide discontinue I dont think she was getting it as it was PRN Repeat Labs Will have to discuss prognosis if continues to get worse at her age  22. Acquired hypothyroidism TSH low Dose was Reduced /changed in 09/28/22  4. Anemia due to stage 4 chronic kidney disease (HCC) and Lymphoma and Chemo Got EPO injection Repeat CBC  5. History of marginal zone  lymphoma Off Chemo for now as WBC has dropped     Family/ staff Communication:   Labs/tests ordered:  Repeat CBC and CMP

## 2022-10-12 LAB — CBC AND DIFFERENTIAL
HCT: 31 — AB (ref 36–46)
Hemoglobin: 10.2 — AB (ref 12.0–16.0)
Neutrophils Absolute: 5639
Platelets: 317 10*3/uL (ref 150–400)
WBC: 11.3

## 2022-10-12 LAB — BASIC METABOLIC PANEL
BUN: 80 — AB (ref 4–21)
CO2: 19 (ref 13–22)
Chloride: 104 (ref 99–108)
Creatinine: 3.3 — AB (ref 0.5–1.1)
Glucose: 106
Potassium: 5.1 mEq/L (ref 3.5–5.1)
Sodium: 135 — AB (ref 137–147)

## 2022-10-12 LAB — COMPREHENSIVE METABOLIC PANEL
Albumin: 2.1 — AB (ref 3.5–5.0)
Calcium: 8.2 — AB (ref 8.7–10.7)
Globulin: 4
eGFR: 12

## 2022-10-12 LAB — HEPATIC FUNCTION PANEL
ALT: 7 U/L (ref 7–35)
AST: 10 — AB (ref 13–35)
Alkaline Phosphatase: 111 (ref 25–125)
Bilirubin, Total: 0.3

## 2022-10-12 LAB — CBC: RBC: 3.11 — AB (ref 3.87–5.11)

## 2022-10-16 ENCOUNTER — Emergency Department (HOSPITAL_COMMUNITY): Payer: Medicare Other

## 2022-10-16 ENCOUNTER — Other Ambulatory Visit: Payer: Self-pay

## 2022-10-16 ENCOUNTER — Encounter (HOSPITAL_COMMUNITY): Payer: Self-pay | Admitting: *Deleted

## 2022-10-16 ENCOUNTER — Emergency Department (HOSPITAL_COMMUNITY)
Admission: EM | Admit: 2022-10-16 | Discharge: 2022-10-16 | Disposition: A | Discharge status: Home / Self Care | Payer: Medicare Other | Attending: Emergency Medicine | Admitting: Emergency Medicine

## 2022-10-16 DIAGNOSIS — R000 Tachycardia, unspecified: Secondary | ICD-10-CM | POA: Diagnosis not present

## 2022-10-16 DIAGNOSIS — E860 Dehydration: Secondary | ICD-10-CM | POA: Diagnosis not present

## 2022-10-16 DIAGNOSIS — Z20822 Contact with and (suspected) exposure to covid-19: Secondary | ICD-10-CM | POA: Diagnosis not present

## 2022-10-16 DIAGNOSIS — Z79899 Other long term (current) drug therapy: Secondary | ICD-10-CM | POA: Diagnosis not present

## 2022-10-16 DIAGNOSIS — E039 Hypothyroidism, unspecified: Secondary | ICD-10-CM | POA: Diagnosis not present

## 2022-10-16 DIAGNOSIS — R55 Syncope and collapse: Secondary | ICD-10-CM | POA: Insufficient documentation

## 2022-10-16 LAB — CBC WITH DIFFERENTIAL/PLATELET
Abs Immature Granulocytes: 0.03 10*3/uL (ref 0.00–0.07)
Basophils Absolute: 0 10*3/uL (ref 0.0–0.1)
Basophils Relative: 0 %
Eosinophils Absolute: 0.1 10*3/uL (ref 0.0–0.5)
Eosinophils Relative: 1 %
HCT: 30.1 % — ABNORMAL LOW (ref 36.0–46.0)
Hemoglobin: 9.2 g/dL — ABNORMAL LOW (ref 12.0–15.0)
Immature Granulocytes: 0 %
Lymphocytes Relative: 29 %
Lymphs Abs: 2.3 10*3/uL (ref 0.7–4.0)
MCH: 31.9 pg (ref 26.0–34.0)
MCHC: 30.6 g/dL (ref 30.0–36.0)
MCV: 104.5 fL — ABNORMAL HIGH (ref 80.0–100.0)
Monocytes Absolute: 1.3 10*3/uL — ABNORMAL HIGH (ref 0.1–1.0)
Monocytes Relative: 16 %
Neutro Abs: 4.3 10*3/uL (ref 1.7–7.7)
Neutrophils Relative %: 54 %
Platelets: 287 10*3/uL (ref 150–400)
RBC: 2.88 MIL/uL — ABNORMAL LOW (ref 3.87–5.11)
RDW: 17.1 % — ABNORMAL HIGH (ref 11.5–15.5)
WBC: 8 10*3/uL (ref 4.0–10.5)
nRBC: 0 % (ref 0.0–0.2)

## 2022-10-16 LAB — URINALYSIS, ROUTINE W REFLEX MICROSCOPIC
Bilirubin Urine: NEGATIVE
Glucose, UA: 50 mg/dL — AB
Ketones, ur: NEGATIVE mg/dL
Leukocytes,Ua: NEGATIVE
Nitrite: NEGATIVE
Protein, ur: >=300 mg/dL — AB
Specific Gravity, Urine: 1.016 (ref 1.005–1.030)
pH: 5 (ref 5.0–8.0)

## 2022-10-16 LAB — COMPREHENSIVE METABOLIC PANEL
ALT: 8 U/L (ref 0–44)
AST: 14 U/L — ABNORMAL LOW (ref 15–41)
Albumin: <1.5 g/dL — ABNORMAL LOW (ref 3.5–5.0)
Alkaline Phosphatase: 96 U/L (ref 38–126)
Anion gap: 7 (ref 5–15)
BUN: 76 mg/dL — ABNORMAL HIGH (ref 8–23)
CO2: 20 mmol/L — ABNORMAL LOW (ref 22–32)
Calcium: 7.4 mg/dL — ABNORMAL LOW (ref 8.9–10.3)
Chloride: 109 mmol/L (ref 98–111)
Creatinine, Ser: 2.93 mg/dL — ABNORMAL HIGH (ref 0.44–1.00)
GFR, Estimated: 14 mL/min — ABNORMAL LOW (ref >60)
Glucose, Bld: 208 mg/dL — ABNORMAL HIGH (ref 70–99)
Potassium: 4.3 mmol/L (ref 3.5–5.1)
Sodium: 136 mmol/L (ref 135–145)
Total Bilirubin: 0.2 mg/dL — ABNORMAL LOW (ref 0.3–1.2)
Total Protein: 5.8 g/dL — ABNORMAL LOW (ref 6.5–8.1)

## 2022-10-16 LAB — TYPE AND SCREEN
ABO/RH(D): B POS
Antibody Screen: NEGATIVE

## 2022-10-16 LAB — RESP PANEL BY RT-PCR (RSV, FLU A&B, COVID)  RVPGX2
Influenza A by PCR: NEGATIVE
Influenza B by PCR: NEGATIVE
Resp Syncytial Virus by PCR: NEGATIVE
SARS Coronavirus 2 by RT PCR: NEGATIVE

## 2022-10-16 LAB — PROTIME-INR
INR: 1.1 (ref 0.8–1.2)
Prothrombin Time: 13.8 seconds (ref 11.4–15.2)

## 2022-10-16 LAB — CBG MONITORING, ED
Glucose-Capillary: 63 mg/dL — ABNORMAL LOW (ref 70–99)
Glucose-Capillary: 201 mg/dL — ABNORMAL HIGH (ref 70–99)
Glucose-Capillary: 99 mg/dL (ref 70–99)

## 2022-10-16 MED ORDER — SODIUM CHLORIDE 0.9 % IV BOLUS
1000.0000 mL | Freq: Once | INTRAVENOUS | Status: AC
  Administered 2022-10-16: 1000 mL via INTRAVENOUS

## 2022-10-16 MED ORDER — DEXTROSE 50 % IV SOLN
INTRAVENOUS | Status: AC
  Administered 2022-10-16: 50 mL via INTRAVENOUS
  Filled 2022-10-16: qty 50

## 2022-10-16 MED ORDER — ACETAMINOPHEN 325 MG PO TABS: 650.0000 mg | ORAL_TABLET | Freq: Four times a day (QID) | ORAL | Status: DC | PRN

## 2022-10-16 MED ORDER — DEXTROSE 50 % IV SOLN: 1.0000 | Freq: Once | INTRAVENOUS | Status: AC

## 2022-10-16 NOTE — ED Notes (Signed)
PBT called to f/u on blood drawn/sent

## 2022-10-16 NOTE — ED Provider Notes (Signed)
[facility] DEPT Provider Note   CSN: [id number] Arrival date & time: 10/16/22  0940     History  Chief Complaint  Patient presents with   Loss of Consciousness    Joy Lynch is a 89 y.o. female who presents from her assisted living facility for syncopal episodes. She was transferring from her bed to the commode when she was found to have a near syncopal episode. EMS was called and she had another syncopal episode with them. She is noted to have a mechanical fall on 12/29 that led to a right humerus fracture. She denies any pain with urination, recent fevers or chills, any current pain, or cough. She is orientated x4, however does not remember the syncopal episodes today. She does not remember her diet, and states that she does not actively drink water. It is available to her, but she does not actively seek it.     Home Medications Prior to Admission medications   Medication Sig Start Date End Date Taking? Authorizing Provider  acalabrutinib maleate (CALQUENCE) 100 MG tablet Take 1 tablet (100 mg total) by mouth daily. Patient not taking: Reported on 10/02/2022 07/26/22   Volanda Napoleon, MD  acetaminophen (TYLENOL) 500 MG tablet Take 500 mg by mouth every 6 (six) hours as needed. Give 1 tablet by mouth three times a day for pain    [provider]  Amino Acids-Protein Hydrolys (PRO-STAT) LIQD Take 30 mLs by mouth daily.    [provider]  aspirin 81 MG chewable tablet Chew 81 mg by mouth daily.    [provider]  Emollient (AQUAPHOR OINTMENT BODY EX) Apply topically. 696EX Give 1 application by mouth at bedtime for dry skin    [provider]  famciclovir (FAMVIR) 125 MG tablet Take 250 mg by mouth daily. Two tablets    [provider]  gabapentin (NEURONTIN) 600 MG tablet Take 600 mg by mouth 3 (three) times daily. 06/13/20   [provider]  lactose free nutrition (BOOST) LIQD Take 237 mLs by mouth  daily.    [provider]  levothyroxine (SYNTHROID) 75 MCG tablet Take 75 mcg by mouth daily before breakfast.    [provider]  loperamide (IMODIUM A-D) 2 MG capsule Take 4 mg by mouth every 8 (eight) hours as needed for diarrhea or loose stools.    [provider]  methocarbamol (ROBAXIN) 500 MG tablet Take 0.5 tablets (250 mg total) by mouth 3 (three) times daily as needed for muscle spasms. 07/05/22   Fargo, Amy E, NP  Multiple Vitamin (MULTI-VITAMINS) TABS Take 1 tablet by mouth daily.    [provider]  pantoprazole (PROTONIX) 40 MG tablet Take 40 mg by mouth 2 (two) times daily.    [provider]  polyethylene glycol powder (MIRALAX) 17 GM/SCOOP powder Give 17 gram by mouth as needed for Bowel Management    [provider]  polyvinyl alcohol (LIQUIFILM TEARS) 1.4 % ophthalmic solution Place 1 drop into both eyes 4 (four) times daily as needed.    [provider]  simethicone (MYLICON) 528 MG chewable tablet Chew 125 mg by mouth 2 (two) times daily as needed for flatulence.    [provider]  traMADol (ULTRAM) 50 MG tablet Take 25 mg by mouth 3 (three) times daily as needed.    [provider]  vitamin B-12 (CYANOCOBALAMIN) 1000 MCG tablet Take 1 tablet (1,000 mcg total) by mouth daily. 05/19/21   Fargo, Barry,  NP  zinc oxide 20 % ointment Apply 1 application. topically as needed for irritation. To buttocks after every incontinent episode and as needed for redness. May keep at bedside.    [provider]      Allergies    Penicillins, Tegretol [carbamazepine], and Lyrica [pregabalin]    Review of Systems   Review of Systems  Physical Exam Updated Vital Signs BP 112/62 (BP Location: Left Arm)   Pulse (!) 109   Temp (!) 97 F (36.1 C) (Oral)   Resp 16   Wt 53.1 kg   SpO2 96%   BMI 18.88 kg/m  Physical Exam Constitutional: Elderly female, AAOx4, in no acute distress  Cardiovascular:  Normal rate and rhythm, no murmurs, rubs, or gallops heard  Abdominal: Non-tender to palpation, normoactive bowel sounds  Lungs: Clear to auscultation bilaterally  Extremities: Sling on right arm wrapping around L neck from humerus fracture   ED Results / Procedures / Treatments   Labs (all labs ordered are listed, but only abnormal results are displayed) Labs Reviewed  CBC WITH DIFFERENTIAL/PLATELET - Abnormal; Notable for the following components:      Result Value   RBC 2.88 (*)    Hemoglobin 9.2 (*)    HCT 30.1 (*)    MCV 104.5 (*)    RDW 17.1 (*)    Monocytes Absolute 1.3 (*)    All other components within normal limits  COMPREHENSIVE METABOLIC PANEL - Abnormal; Notable for the following components:   CO2 20 (*)    Glucose, Bld 208 (*)    BUN 76 (*)    Creatinine, Ser 2.93 (*)    Calcium 7.4 (*)    Total Protein 5.8 (*)    Albumin <1.5 (*)    AST 14 (*)    Total Bilirubin 0.2 (*)    GFR, Estimated 14 (*)    All other components within normal limits  CBG MONITORING, ED - Abnormal; Notable for the following components:   Glucose-Capillary 63 (*)    All other components within normal limits  CBG MONITORING, ED - Abnormal; Notable for the following components:   Glucose-Capillary 201 (*)    All other components within normal limits  RESP PANEL BY RT-PCR (RSV, FLU A&B, COVID)  [id number]  PROTIME-INR  CBC WITH DIFFERENTIAL/PLATELET  URINALYSIS, ROUTINE W REFLEX MICROSCOPIC  POC OCCULT BLOOD, ED  TYPE AND SCREEN    EKG EKG Interpretation  Date/Time:  Tuesday October 16 2022 14:35:44 EST Ventricular Rate:  106 PR Interval:  147 QRS Duration: 84 QT Interval:  365 QTC Calculation: 485 R Axis:   43 Text Interpretation: Sinus tachycardia Low voltage, precordial leads No significant change since last tracing Confirmed by Isla Pence [phone]) on 10/16/2022 2:37:20 PM  Radiology CT Head Wo Contrast  Result Date: 10/16/2022 CLINICAL DATA:  Syncopal episode. EXAM: CT  HEAD WITHOUT CONTRAST TECHNIQUE: Contiguous axial images were obtained from the base of the skull through the vertex without intravenous contrast. RADIATION DOSE REDUCTION: This exam was performed according to the departmental dose-optimization program which includes automated exposure control, adjustment of the mA and/or kV according to patient size and/or use of iterative reconstruction technique. COMPARISON:  Head CT dated 09/28/2022. FINDINGS: Brain: Generalized age related parenchymal volume loss with commensurate dilatation of the ventricles and sulci. Mild chronic small vessel ischemic changes within the bilateral periventricular and subcortical white matter regions. No mass, hemorrhage, edema or other evidence of acute parenchymal abnormality. No extra-axial hemorrhage. Vascular: Chronic calcified atherosclerotic changes  of the large vessels at the skull base. No unexpected hyperdense vessel. Skull: Normal. Negative for fracture or focal lesion. Sinuses/Orbits: No acute finding. Other: None. IMPRESSION: 1. No acute findings. No intracranial mass, hemorrhage or edema. 2. Mild chronic small vessel ischemic changes in the white matter. Electronically Signed   By: Franki Cabot M.D.   On: 10/16/2022 13:55   DG Chest 2 View  Result Date: 10/16/2022 CLINICAL DATA:  Syncope. EXAM: CHEST - 2 VIEW COMPARISON:  CT chest dated October 19, 2020. Chest x-ray dated August 24, 2020. FINDINGS: The heart size and mediastinal contours are within normal limits. Normal pulmonary vascularity. No focal consolidation, pleural effusion, or pneumothorax. No acute osseous abnormality. Old bilateral rib and sternal fractures again noted. IMPRESSION: No active cardiopulmonary disease. Electronically Signed   By: [facility] Dubin M.D.   On: 10/16/2022 12:19     Medications Ordered in ED Medications  acetaminophen (TYLENOL) tablet 650 mg (has no administration in time range)  sodium chloride 0.9 % bolus 1,000 mL (0 mLs  Intravenous Stopped 10/16/22 1117)  dextrose 50 % solution 50 mL (50 mLs Intravenous Given 10/16/22 1116)    ED Course/ Medical Decision Making/ A&P                             Medical Decision Making Patient presents from her assisted living facility after 1 near syncopal episode and 1 full syncopal episode with EMS.  Do not believe that this is an infectious cause currently, as patient states she has had no symptoms of cough, dysuria, nausea, vomiting, diarrhea.  She is currently not in any pain.  On physical exam, she has dry mucous membranes and decreased skin turgor.  She is also slightly hypotensive with a systolic around 888.  I believe her syncopal episodes may be due to dehydration.  Creatinine elevated to 2.9, however this was taken after patient was given fluids.  Unsure about patient's baseline, as it seems to recently have increased to around 2.6-2.7.  Albumin less than 1.5, which could indicate malnutrition.  Currently awaiting urine analysis for further evaluation and ruling out UTI, as well as orthostatic vital signs.  CT scan of head showed no acute intracranial abnormalities. Discussed with family and they are comfortable with patient returning to facility, barring any complications of remaining tests. Will sign out to next team.   Amount and/or Complexity of Data Reviewed Labs: ordered. Decision-making details documented in ED Course. Radiology: ordered. Decision-making details documented in ED Course. ECG/medicine tests: ordered. Decision-making details documented in ED Course.  Risk OTC drugs. Prescription drug management.   Final Clinical Impression(s) / ED Diagnoses Final diagnoses:  None  Dehydration related Syncope       Jessic Standifer, Marlene Lard, MD 10/16/22 1523    Isla Pence, MD 10/16/22 1544

## 2022-10-16 NOTE — ED Notes (Signed)
Back from xray. Lab at [facility], pt resting, NAD, calm, interactive.

## 2022-10-16 NOTE — Discharge Instructions (Addendum)
You were brought into the emergency department after two episodes of passing out this morning. Your imaging did not show any abnormalities, and your labs are looking great as well. I believe these episodes were likely due to dehydration.  Make sure you drink a lot of water (and let your facility know as well). And keep your diet up as well. Take care!

## 2022-10-16 NOTE — ED Notes (Addendum)
Pt to xray via stretcher. Alert, NAD, calm, interactive, no changes. BS improved to 201 after amp of D50. Given juice at [facility].

## 2022-10-16 NOTE — ED Notes (Signed)
Into room for urine sample. Family at [facility].

## 2022-10-16 NOTE — ED Triage Notes (Signed)
BIB [facility] from [facility] for near syncope with staff while transferring to commode, then had syncopal event with EMS transferring to stretcher. Reports became diaphoretic, and pale. HR 120, BP 80/60. No obvious signs of infection or exposure. A&Ox4, but had some confusion based on statements. NS IVF 400cc given. 22g L hand. R arm in sling. Mentions back wound. Episode of initial low SPO2 which resolved. Pt alert, NAD, calm, interactive, speaking clearly, no resps distress. MD at [facility].

## 2022-10-16 NOTE — ED Notes (Signed)
Pt resting/ sleeping. VSS. Pending PTAR.

## 2022-10-16 NOTE — ED Provider Notes (Signed)
Patient signed to me by prior provider.  Urinalysis was without signs of infection.  Patient was able to eat and drink appropriately here.  She is stable for discharge   Lacretia Leigh, MD 10/16/22 1656

## 2022-10-16 NOTE — ED Notes (Signed)
Patient left with PTAR. 

## 2022-10-16 NOTE — ED Notes (Signed)
Lavender and light green resent to lab

## 2022-10-16 NOTE — ED Notes (Signed)
Lab has multiple requests for multiple labs to be repeated. PBT/ lab called to obtain blood.

## 2022-10-17 ENCOUNTER — Encounter: Payer: Self-pay | Admitting: Orthopedic Surgery

## 2022-10-17 ENCOUNTER — Non-Acute Institutional Stay (SKILLED_NURSING_FACILITY): Payer: Medicare Other | Admitting: Orthopedic Surgery

## 2022-10-17 DIAGNOSIS — S0083XD Contusion of other part of head, subsequent encounter: Secondary | ICD-10-CM

## 2022-10-17 DIAGNOSIS — N184 Chronic kidney disease, stage 4 (severe): Secondary | ICD-10-CM | POA: Diagnosis not present

## 2022-10-17 DIAGNOSIS — Z8572 Personal history of non-Hodgkin lymphomas: Secondary | ICD-10-CM

## 2022-10-17 DIAGNOSIS — R531 Weakness: Secondary | ICD-10-CM

## 2022-10-17 DIAGNOSIS — E039 Hypothyroidism, unspecified: Secondary | ICD-10-CM

## 2022-10-17 DIAGNOSIS — R55 Syncope and collapse: Secondary | ICD-10-CM | POA: Diagnosis not present

## 2022-10-17 DIAGNOSIS — E440 Moderate protein-calorie malnutrition: Secondary | ICD-10-CM

## 2022-10-17 DIAGNOSIS — S42491D Other displaced fracture of lower end of right humerus, subsequent encounter for fracture with routine healing: Secondary | ICD-10-CM

## 2022-10-17 DIAGNOSIS — D631 Anemia in chronic kidney disease: Secondary | ICD-10-CM

## 2022-10-17 NOTE — Progress Notes (Addendum)
Location:  [facility] Room Number: 16A Place of Service:  SNF [phone]) Provider:  Windell Moulding, NP   Patient Care Team: Virgie Dad, MD as PCP - General (Internal Medicine)  Extended Emergency Contact Information Primary Emergency Contact: Genia Plants Address: [address]          [place], [place] [address] Johnnette Litter of [place] Phone: [phone] Relation: Alanson Puls Secondary Emergency Contact: [name] Address: [address]          [place], [place] [address] Johnnette Litter of [place] Phone: [phone] Mobile Phone: [phone] Relation: Nephew  Code Status:  Full Code Goals of care: Advanced Directive information    10/17/2022   10:49 AM  Advanced Directives  Does Patient Have a Medical Advance Directive? Yes  Type of Paramedic of [place];Living will  Does patient want to make changes to medical advance directive? No - Patient declined  Copy of [place] in Chart? Yes - validated most recent copy scanned in chart (See row information)     Chief Complaint  Patient presents with   Hospitalization Follow-up    Patient is being seen for a follow-up from [facility] for syncope and collapse with dehydration.    HPI:  Pt is a 89 y.o. female seen today f/u s/p ED visit from [facility] 10/06/2022.  She recently moved to skilled nursing at [facility] PMH: HTN, CKD,GERD, hypothyroidism, h/o frontoparietal SAH with rib fracture, cerebellar infarct, trigeminal neuralgia, osteoporosis, pancytopenia, CLL/ B cell lymphoma.   12/22 Calquence stopped due to low WBC and worsening kidney function.  12/29 she had a mechanical fall that resulted in right humerus fracture. Right arm now remains in splint. She denies pain today.   01/16 she was working with therapy and had a syncopal event while transferring from bed to commode. She did hit her head. In the ED, she was found to be  hypotensive, SBP 100. She was given IV fluids. EKG sinus tachycardia. CT head negative for acute findings, mild chronic small vessel ischemic changes in white matter noted.CXR with no active cardiopulmonary disease. WBC 8.0, hgb 9.2, platelets 287, glucose 99, BUN/creat 76/2.93, GFR 14, calcium 7.6, protein 5.8, albumin < 1.5, AST/ALT14/8. UA/culture collected> pending. Syncope thought to be related to dehydration/ orthostatic hypotension. She was discharged back to [facility] same day.   Today, she does not remember event. She can recall being in the ED. She reports feeling tired and weak. She is not eating and drinking well. Tried Ensure supplement in the past and did not like them. She has bruising to the right side of her face, denies pain. Recent blood pressures 102/53, 110/69. PT/OT reports she tires easily between activities. They plan to break up therapy sessions in the future. Denies chest pain, sob, dizziness, or lightheadedness today.     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   Hypothyroidism, unspecified  per [facility]   Lymphedema, not elsewhere classified    Per [facility]   Migraines    Muscle weakness (generalized)    [place]   Neuralgia and neuritis, unspecified    [place]   Nontraumatic subarachnoid hemorrhage, unspecified ([place])    per [facility]   Osteoporosis    Pain in right shoulder    Tachycardia, unspecified    [place]   Thyroid disease    Trigeminal neuralgia of left side of face    Unsteadiness on feet    [facility]   Past Surgical History:  Procedure Laterality  Date   gamma knife     for trigeminal neuralgia   SHOULDER SURGERY     Right shoulder replacement   TONSILLECTOMY AND ADENOIDECTOMY     WISDOM TOOTH EXTRACTION      Allergies  Allergen Reactions   Penicillins Hives   Tegretol [Carbamazepine] Hives and Other (See Comments)    Turned purple from neck down and drowsy    Lyrica [Pregabalin] Other (See Comments)    Drowsy    Outpatient Encounter Medications as of 10/17/2022  Medication Sig   acetaminophen (TYLENOL) 500 MG tablet Take 500 mg by mouth every 6 (six) hours as needed. Give 1 tablet by mouth three times a day for pain   Amino Acids-Protein Hydrolys (PRO-STAT) LIQD Take 30 mLs by mouth daily.   aspirin 81 MG chewable tablet Chew 81 mg by mouth daily.   Emollient (AQUAPHOR OINTMENT BODY EX) Apply topically. 086PY Give 1 application by mouth at bedtime for dry skin   famciclovir (FAMVIR) 125 MG tablet Take 250 mg by mouth daily. Two tablets   gabapentin (NEURONTIN) 600 MG tablet Take 600 mg by mouth 3 (three) times daily.   lactose free nutrition (BOOST) LIQD Take 237 mLs by mouth daily.   levothyroxine (SYNTHROID) 75 MCG tablet Take 75 mcg by mouth daily before breakfast.   loperamide (IMODIUM A-D) 2 MG capsule Take 4 mg by mouth every 8 (eight) hours as needed for diarrhea or loose stools.   methocarbamol (ROBAXIN) 500 MG tablet Take 0.5 tablets (250 mg total) by mouth 3 (three) times daily as needed for muscle spasms.   Multiple Vitamin (MULTI-VITAMINS) TABS Take 1 tablet by mouth daily.   pantoprazole (PROTONIX) 40 MG tablet Take 40 mg by mouth 2 (two) times daily.   polyethylene glycol powder (MIRALAX) 17 GM/SCOOP powder Give 17 gram by mouth as needed for Bowel Management   polyvinyl alcohol (LIQUIFILM TEARS) 1.4 % ophthalmic solution Place 1 drop into both eyes 4 (four) times daily as needed.   simethicone (MYLICON) 195 MG chewable tablet Chew 125 mg by mouth 2 (two) times daily as needed for flatulence.   traMADol  (ULTRAM) 50 MG tablet Take 25 mg by mouth 3 (three) times daily as needed.   vitamin B-12 (CYANOCOBALAMIN) 1000 MCG tablet Take 1 tablet (1,000 mcg total) by mouth daily.   zinc oxide 20 % ointment Apply 1 application. topically as needed for irritation. To buttocks after every incontinent episode and as needed for redness. May keep at bedside.   [DISCONTINUED] acalabrutinib maleate (CALQUENCE) 100 MG tablet Take 1 tablet (100 mg total) by mouth daily. (Patient not taking: Reported on 10/02/2022)   Facility-Administered Encounter Medications as of 10/17/2022  Medication   epoetin alfa-epbx (RETACRIT) injection 40,000 Units    Review of Systems  Constitutional:  Positive for activity change, appetite change and fatigue. Negative for fever.  HENT:  Negative for congestion and trouble swallowing.  Eyes:  Negative for visual disturbance.  Respiratory:  Negative for cough, shortness of breath and wheezing.   Cardiovascular:  Negative for chest pain and leg swelling.  Gastrointestinal:  Negative for abdominal distention and abdominal pain.  Genitourinary:  Negative for dysuria and frequency.  Musculoskeletal:  Positive for gait problem.  Skin:  Positive for wound.  Neurological:  Positive for weakness. Negative for dizziness and light-headedness.  Psychiatric/Behavioral:  Positive for confusion. Negative for dysphoric mood and sleep disturbance. The patient is not nervous/anxious.     Immunization History  Administered Date(s) Administered   Fluad Quad(high Dose 65+) 05/28/2019, 07/25/2022   Influenza Split 07/12/2016, 08/13/2017, 06/02/2019, 09/09/2020   Influenza, High Dose Seasonal PF 06/11/2017, 06/28/2018   Influenza-Unspecified 06/21/2020, 07/25/2021   Moderna SARS-COV2 Booster Vaccination 03/08/2021   Moderna Sars-Covid-2 Vaccination 10/05/2019, 11/02/2019, 08/15/2020   PFIZER(Purple Top)SARS-COV-2 Vaccination 06/21/2021   Pfizer Covid-19 Vaccine Bivalent Booster 5yr & up 08/07/2022    Pneumococcal Conjugate-13 11/22/2014   Pneumococcal Polysaccharide-23 04/29/2009, 08/13/2017   Td 01/24/2013   Tdap 12/28/2017, 07/01/2018, 12/13/2020   Zoster Recombinat (Shingrix) 07/01/2018   Zoster, Live 11/12/2009, 07/01/2018   Zoster, Unspecified 12/17/2005   Pertinent  Health Maintenance Due  Topic Date Due   INFLUENZA VACCINE  Completed   DEXA SCAN  Completed      07/18/2022    1:38 PM 08/31/2022   12:04 PM 09/21/2022    2:18 PM 09/28/2022    4:34 PM 10/17/2022   10:45 AM  Fall Risk  Falls in the past year?  0   0  Was there an injury with Fall?  0   0  Fall Risk Category Calculator  0   0  Fall Risk Category (Retired)  Low     (RETIRED) Patient Fall Risk Level High fall risk Low fall risk High fall risk High fall risk   Patient at Risk for Falls Due to  History of fall(s);Impaired balance/gait   History of fall(s);Impaired balance/gait  Fall risk Follow up  Falls evaluation completed;Education provided   Falls evaluation completed   Functional Status Survey:    Vitals:   10/17/22 1038  BP: 110/69  Pulse: 90  Resp: 18  Temp: 98.2 F (36.8 C)  SpO2: 99%  Weight: 113 lb 5 oz (51.4 kg)  Height: '5\' 6"'$  (1.676 m)   Body mass index is 18.29 kg/m. Physical Exam Vitals reviewed.  Constitutional:      General: She is not in acute distress.    Appearance: She is underweight.     Comments: Pale complexion  HENT:     Head: Normocephalic. Contusion present. No right periorbital erythema or left periorbital erythema.     Comments: Contusion to right temple/forehead.  Eyes:     General:        Right eye: No discharge.        Left eye: No discharge.     Extraocular Movements: Extraocular movements intact.     Conjunctiva/sclera: Conjunctivae normal.     Pupils: Pupils are equal, round, and reactive to light.  Cardiovascular:     Rate and Rhythm: Normal rate and regular rhythm.     Pulses: Normal pulses.     Heart sounds: Normal heart sounds.  Pulmonary:      Effort: Pulmonary effort is normal. No respiratory distress.     Breath sounds: Normal breath sounds. No wheezing.  Abdominal:     General: Bowel sounds are normal. There is no distension.     Palpations:  Abdomen is soft.     Tenderness: There is no abdominal tenderness.  Musculoskeletal:     Right upper arm: Tenderness present. No swelling or deformity.     Cervical back: Neck supple.     Right lower leg: No edema.     Left lower leg: No edema.     Comments: Right radial pulse 1+, cap refill < 3 sec, no numbness/tingling to right fingers/hand, right wrist FROM  Skin:    General: Skin is warm and dry.     Capillary Refill: Capillary refill takes less than 2 seconds.  Neurological:     General: No focal deficit present.     Mental Status: She is alert and oriented to person, place, and time.     Motor: Weakness present.     Gait: Gait abnormal.  Psychiatric:        Mood and Affect: Mood normal.        Behavior: Behavior normal.     Labs reviewed: Recent Labs    09/21/22 1358 09/28/22 1700 10/04/22 0000 10/12/22 1200 10/16/22 1200  NA 139 132*  --  135* 136  K 5.0 5.2*  --  5.1 4.3  CL 107 106  --  104 109  CO2 19* 17*  --  19 20*  GLUCOSE 105* 118*  --   --  208*  BUN 73* 68*  --  80* 76*  CREATININE 3.33* 3.13* 3.3* 3.3* 2.93*  CALCIUM 8.6* 7.9*  --  8.2* 7.4*   Recent Labs    09/21/22 1358 09/28/22 1700 10/12/22 1200 10/16/22 1200  AST 10* 145* 10* 14*  ALT 7 62* 7 8  ALKPHOS 75 84 111 96  BILITOT 0.3 0.3  --  0.2*  PROT 5.9* 5.6*  --  5.8*  ALBUMIN 2.5* 1.6* 2.1* <1.5*   Recent Labs    09/21/22 1358 09/28/22 1700 10/12/22 1200 10/16/22 1200  WBC 3.2* 3.1* 11.3 8.0  NEUTROABS 1.4* 2.0 5,639.00 4.3  HGB 10.1* 11.4* 10.2* 9.2*  HCT 31.7* 35.7* 31* 30.1*  MCV 102.3* 103.2*  --  104.5*  PLT 127* 175 317 287   Lab Results  Component Value Date   TSH 0.02 (A) 10/04/2022   No results found for: "HGBA1C" No results found for: "CHOL", "HDL",  "LDLCALC", "LDLDIRECT", "TRIG", "CHOLHDL"  Significant Diagnostic Results in last 30 days:  CT Head Wo Contrast  Result Date: 10/16/2022 CLINICAL DATA:  Syncopal episode. EXAM: CT HEAD WITHOUT CONTRAST TECHNIQUE: Contiguous axial images were obtained from the base of the skull through the vertex without intravenous contrast. RADIATION DOSE REDUCTION: This exam was performed according to the departmental dose-optimization program which includes automated exposure control, adjustment of the mA and/or kV according to patient size and/or use of iterative reconstruction technique. COMPARISON:  Head CT dated 09/28/2022. FINDINGS: Brain: Generalized age related parenchymal volume loss with commensurate dilatation of the ventricles and sulci. Mild chronic small vessel ischemic changes within the bilateral periventricular and subcortical white matter regions. No mass, hemorrhage, edema or other evidence of acute parenchymal abnormality. No extra-axial hemorrhage. Vascular: Chronic calcified atherosclerotic changes of the large vessels at the skull base. No unexpected hyperdense vessel. Skull: Normal. Negative for fracture or focal lesion. Sinuses/Orbits: No acute finding. Other: None. IMPRESSION: 1. No acute findings. No intracranial mass, hemorrhage or edema. 2. Mild chronic small vessel ischemic changes in the white matter. Electronically Signed   By: Franki Cabot M.D.   On: 10/16/2022 13:55   DG Chest 2  View  Result Date: 10/16/2022 CLINICAL DATA:  Syncope. EXAM: CHEST - 2 VIEW COMPARISON:  CT chest dated October 19, 2020. Chest x-ray dated August 24, 2020. FINDINGS: The heart size and mediastinal contours are within normal limits. Normal pulmonary vascularity. No focal consolidation, pleural effusion, or pneumothorax. No acute osseous abnormality. Old bilateral rib and sternal fractures again noted. IMPRESSION: No active cardiopulmonary disease. Electronically Signed   By: Titus Dubin M.D.   On:  10/16/2022 12:19    Assessment/Plan 1. Syncope and collapse - 01/16 syncopal episode working with PT/OT - suspect dehydration/ orthostatic hypotension per ED - CT head/ CXR negative for acute findings - recommend BP BID x 7 days - not on antihypertensive - consider starting midodrine if BP continues to be low - encourage oral hydration with water  2. Moderate protein-calorie malnutrition (HCC) - BMI 18.29 - Protein 5.8, albumin < 1.5 - progressive weakness with poor appetite - unsuccessful trial of ensure in past - recommend milkshake daily> prefers chocolate  3. Other closed displaced fracture of distal end of right humerus with routine healing, subsequent encounter - 12/29 mechanical fall - right arm in sling - NWB to RUE - cont tramadol for pain  4. CKD (chronic kidney disease) stage 4, GFR 15-29 ml/min (HCC) - BUN/creat 76/2.93, GFR 14  - avoid nephrotoxic drugs like NSAIDS and dose adjust medications to be renally excreted - encourage hydration with water   5. Acquired hypothyroidism - TSH stable - cont levothyroxine  6. Anemia due to stage 4 chronic kidney disease (HCC) - hgb 9.2 - recently had EPO injection - see above  7. History of B-cell lymphoma - followed by oncology - off Calquence due to low WBC  8. Weakness - moved to SNF 01/15 - cont PT/OT> will try smaller sessions     Family/ staff Communication: plan discussed with patient and nurse  Labs/tests ordered:  bmp in 1 week

## 2022-10-18 ENCOUNTER — Encounter: Payer: Self-pay | Admitting: Internal Medicine

## 2022-10-18 ENCOUNTER — Non-Acute Institutional Stay (SKILLED_NURSING_FACILITY): Payer: Medicare Other | Admitting: Internal Medicine

## 2022-10-18 DIAGNOSIS — R55 Syncope and collapse: Secondary | ICD-10-CM | POA: Diagnosis not present

## 2022-10-18 DIAGNOSIS — N184 Chronic kidney disease, stage 4 (severe): Secondary | ICD-10-CM

## 2022-10-18 DIAGNOSIS — Z8572 Personal history of non-Hodgkin lymphomas: Secondary | ICD-10-CM

## 2022-10-18 DIAGNOSIS — S42491D Other displaced fracture of lower end of right humerus, subsequent encounter for fracture with routine healing: Secondary | ICD-10-CM

## 2022-10-18 DIAGNOSIS — R531 Weakness: Secondary | ICD-10-CM

## 2022-10-18 DIAGNOSIS — D631 Anemia in chronic kidney disease: Secondary | ICD-10-CM

## 2022-10-18 DIAGNOSIS — L89112 Pressure ulcer of right upper back, stage 2: Secondary | ICD-10-CM

## 2022-10-18 DIAGNOSIS — R634 Abnormal weight loss: Secondary | ICD-10-CM

## 2022-10-18 NOTE — Progress Notes (Signed)
Provider:   Location:  [place] Room Number: 16A Place of Service:  SNF (31)  PCP: Virgie Dad, MD Patient Care Team: Virgie Dad, MD as PCP - General (Internal Medicine)  Extended Emergency Contact Information Primary Emergency Contact: Genia Plants Address: [address]          [place], [place] [address] Johnnette Litter of [place] Phone: [phone] Relation: Nephew Secondary Emergency Contact: [name] Address: [address]          [place], [place] [address] Johnnette Litter of [place] Phone: [phone] Mobile Phone: [phone] Relation: Nephew  Code Status: Full Code Goals of Care: Advanced Directive information    10/18/2022   11:19 AM  Advanced Directives  Does Patient Have a Medical Advance Directive? Yes  Type of Paramedic of [place];Living will  Does patient want to make changes to medical advance directive? No - Patient declined  Copy of [place] in Chart? Yes - validated most recent copy scanned in chart (See row information)      Chief Complaint  Patient presents with   New Admit To SNF    Patient is new Admit to SNF   Immunizations    Discussed the need for shingles vaccine    HPI: Patient is a 89 y.o. 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  osteoporosis,   hypothyroidism,  trigeminal neuralgia and migraines   H/o  frontoparietal subarachnoid hemorrhage and Rib fractures LE edema mild   On 12/22 patient was also found ot have Low WBC and Worsening Creat Calquence stopped  Patient had mechanical fall on 09/28/22 Sustained Right Humeral fracture Now in Splint   Had Syncope episode on 2 days ago while working with therapy she became unconscious and BP went down in 80s Send to ED for eval Diagnosed with Possible Dehydration  She has lost weight  Almost 15 lbs in past 10 months Does not remember going to ED Was having more cognition issues today which is new for her Also has new place in her back which is PU due to her Arm Brace  Past Medical History:  Diagnosis Date   Abnormal posture    [place]   Cancer ([place])    non hodgkin lymphoma   Chronic lymphocytic leukemia ([place])    Cognitive communication deficit    [place]   Dorsalgia, unspecified    per [facility]   Dry eye syndrome of unspecified lacrimal gland    per [facility]   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[place])    Per [facility]   History of B-cell lymphoma 11/02/2015   History of falling    [place]   Hypothyroidism, unspecified    per [facility]   Lymphedema, not elsewhere classified    Per [facility]   Migraines    Muscle weakness (generalized)    [place]   Neuralgia and neuritis, unspecified    [place]   Nontraumatic subarachnoid hemorrhage, unspecified ([place])    per [facility]   Osteoporosis    Pain in right shoulder    Tachycardia, unspecified    [place]   Thyroid disease    Trigeminal neuralgia of left side of face    Unsteadiness on feet    [facility]   Past Surgical History:  Procedure Laterality Date   gamma knife     for  trigeminal neuralgia   SHOULDER SURGERY     Right shoulder replacement   TONSILLECTOMY AND ADENOIDECTOMY     WISDOM TOOTH EXTRACTION      reports that she quit smoking about 47 years ago. Her smoking use included cigarettes. She has a 10.00 pack-year smoking history. She has never used smokeless tobacco. She reports current alcohol use of about 2.0 standard drinks of alcohol per week. She reports that she does not use drugs. Social History   Socioeconomic History   Marital status: Divorced    Spouse name: Not on file   Number of children: 0   Years of education: Masters   Highest education level: Not on file  Occupational History   Occupation: Retired  Tobacco Use   Smoking status: Former    Packs/day: 0.50    Years: 20.00     Total pack years: 10.00    Types: Cigarettes    Quit date: 10/02/1975    Years since quitting: 47.0   Smokeless tobacco: Never  Vaping Use   Vaping Use: Never used  Substance and Sexual Activity   Alcohol use: Yes    Alcohol/week: 2.0 standard drinks of alcohol    Types: 2 Glasses of wine per week   Drug use: No   Sexual activity: Never    Comment: divorced, no children, sister in law next of kin. Retired Actuary for [employer]  Other Topics Concern   Not on file  Social History Narrative   Lives at home alone.   Right-handed.   4 cups caffeine per day.   Social Determinants of Health   Financial Resource Strain: Low Risk  (08/31/2022)   Overall Financial Resource Strain (CARDIA)    Difficulty of Paying Living Expenses: Not hard at all  Food Insecurity: No Food Insecurity (08/31/2022)   Hunger Vital Sign    Worried About Running Out of Food in the Last Year: Never true    Ran Out of Food in the Last Year: Never true  Transportation Needs: No Transportation Needs (05/03/2021)   PRAPARE - Hydrologist (Medical): No    Lack of Transportation (Non-Medical): No  Physical Activity: Insufficiently Active (08/31/2022)   Exercise Vital Sign    Days of Exercise per Week: 2 days    Minutes of Exercise per Session: 10 min  Stress: No Stress Concern Present (08/31/2022)   [place]    Feeling of Stress : Not at all  Social Connections: Socially Isolated (08/31/2022)   Social Connection and Isolation Panel [NHANES]    Frequency of Communication with Friends and Family: More than three times a week    Frequency of Social Gatherings with Friends and Family: Twice a week    Attends Religious Services: Never    Marine scientist or Organizations: No    Attends Archivist Meetings: Never    Marital Status: Divorced  Human resources officer Violence: Not At Risk (05/03/2021)   Humiliation,  Afraid, Rape, and Kick questionnaire    Fear of Current or Ex-Partner: No    Emotionally Abused: No    Physically Abused: No    Sexually Abused: No    Functional Status Survey:    Family History  Problem Relation Age of Onset   Cancer Father        unknown ca   Pneumonia Father    Cancer Brother        prostate ca  Stroke Brother    Heart attack Mother     Health Maintenance  Topic Date Due   Zoster Vaccines- Shingrix (2 of 2) 08/26/2018   COVID-19 Vaccine (6 - 2023-24 season) 10/27/2022 (Originally 10/02/2022)   Medicare Annual Wellness (AWV)  09/01/2023   DTaP/Tdap/Td (6 - Td or Tdap) 12/14/2030   Pneumonia Vaccine 34+ Years old  Completed   INFLUENZA VACCINE  Completed   DEXA SCAN  Completed   HPV VACCINES  Aged Out    Allergies  Allergen Reactions   Penicillins Hives   Tegretol [Carbamazepine] Hives and Other (See Comments)    Turned purple from neck down and drowsy    Lyrica [Pregabalin] Other (See Comments)    Drowsy    Outpatient Encounter Medications as of 10/18/2022  Medication Sig   acetaminophen (TYLENOL) 500 MG tablet Take 500 mg by mouth every 6 (six) hours as needed. Give 1 tablet by mouth three times a day for pain   Amino Acids-Protein Hydrolys (PRO-STAT) LIQD Take 30 mLs by mouth daily.   aspirin 81 MG chewable tablet Chew 81 mg by mouth daily.   Emollient (AQUAPHOR OINTMENT BODY EX) Apply topically. 413KG Give 1 application by mouth at bedtime for dry skin   famciclovir (FAMVIR) 125 MG tablet Take 250 mg by mouth daily. Two tablets   gabapentin (NEURONTIN) 600 MG tablet Take 600 mg by mouth 3 (three) times daily.   lactose free nutrition (BOOST) LIQD Take 237 mLs by mouth daily.   levothyroxine (SYNTHROID) 75 MCG tablet Take 75 mcg by mouth daily before breakfast.   loperamide (IMODIUM A-D) 2 MG capsule Take 4 mg by mouth every 8 (eight) hours as needed for diarrhea or loose stools.   methocarbamol (ROBAXIN) 500 MG tablet Take 0.5 tablets (250 mg  total) by mouth 3 (three) times daily as needed for muscle spasms.   Multiple Vitamin (MULTI-VITAMINS) TABS Take 1 tablet by mouth daily.   pantoprazole (PROTONIX) 40 MG tablet Take 40 mg by mouth 2 (two) times daily.   polyethylene glycol powder (MIRALAX) 17 GM/SCOOP powder Give 17 gram by mouth as needed for Bowel Management   polyvinyl alcohol (LIQUIFILM TEARS) 1.4 % ophthalmic solution Place 1 drop into both eyes 4 (four) times daily as needed.   simethicone (MYLICON) 401 MG chewable tablet Chew 125 mg by mouth 2 (two) times daily as needed for flatulence.   traMADol (ULTRAM) 50 MG tablet Take 25 mg by mouth 3 (three) times daily as needed.   vitamin B-12 (CYANOCOBALAMIN) 1000 MCG tablet Take 1 tablet (1,000 mcg total) by mouth daily.   zinc oxide 20 % ointment Apply 1 application. topically as needed for irritation. To buttocks after every incontinent episode and as needed for redness. May keep at bedside.   Facility-Administered Encounter Medications as of 10/18/2022  Medication   epoetin alfa-epbx (RETACRIT) injection 40,000 Units    Review of Systems  Constitutional:  Positive for activity change, appetite change and unexpected weight change.  HENT: Negative.    Respiratory:  Negative for cough and shortness of breath.   Cardiovascular:  Negative for leg swelling.  Gastrointestinal:  Negative for constipation.  Genitourinary: Negative.   Musculoskeletal:  Positive for gait problem. Negative for arthralgias and myalgias.  Skin: Negative.   Neurological:  Positive for weakness. Negative for dizziness.  Psychiatric/Behavioral:  Positive for confusion. Negative for dysphoric mood and sleep disturbance.     Vitals:   10/18/22 1113  BP: 127/72  Pulse: 98  Resp:  20  Temp: 98.7 F (37.1 C)  TempSrc: Temporal  SpO2: 99%  Weight: 113 lb 8 oz (51.5 kg)  Height: '5\' 6"'$  (1.676 m)   Body mass index is 18.32 kg/m. Physical Exam Vitals reviewed.  Constitutional:      Appearance:  Normal appearance.  HENT:     Head: Normocephalic.     Nose: Nose normal.     Mouth/Throat:     Mouth: Mucous membranes are moist.     Pharynx: Oropharynx is clear.  Eyes:     Pupils: Pupils are equal, round, and reactive to light.  Cardiovascular:     Rate and Rhythm: Normal rate and regular rhythm.     Pulses: Normal pulses.     Heart sounds: Normal heart sounds. No murmur heard. Pulmonary:     Effort: Pulmonary effort is normal.     Breath sounds: Normal breath sounds.  Abdominal:     General: Abdomen is flat. Bowel sounds are normal.     Palpations: Abdomen is soft.  Musculoskeletal:        General: No swelling.     Cervical back: Neck supple.  Skin:    General: Skin is warm.     Comments: PU stage 2 in the Upper back   Neurological:     General: No focal deficit present.     Mental Status: She is alert.     Comments: Alert and responds Appropriately but does not remember going to ED Also seems more frail and tired  Psychiatric:        Mood and Affect: Mood normal.        Thought Content: Thought content normal.     Labs reviewed: Basic Metabolic Panel: Recent Labs    09/21/22 1358 09/28/22 1700 10/04/22 0000 10/12/22 1200 10/16/22 1200  NA 139 132*  --  135* 136  K 5.0 5.2*  --  5.1 4.3  CL 107 106  --  104 109  CO2 19* 17*  --  19 20*  GLUCOSE 105* 118*  --   --  208*  BUN 73* 68*  --  80* 76*  CREATININE 3.33* 3.13* 3.3* 3.3* 2.93*  CALCIUM 8.6* 7.9*  --  8.2* 7.4*   Liver Function Tests: Recent Labs    09/21/22 1358 09/28/22 1700 10/12/22 1200 10/16/22 1200  AST 10* 145* 10* 14*  ALT 7 62* 7 8  ALKPHOS 75 84 111 96  BILITOT 0.3 0.3  --  0.2*  PROT 5.9* 5.6*  --  5.8*  ALBUMIN 2.5* 1.6* 2.1* <1.5*   No results for input(s): "LIPASE", "AMYLASE" in the last 8760 hours. No results for input(s): "AMMONIA" in the last 8760 hours. CBC: Recent Labs    09/21/22 1358 09/28/22 1700 10/12/22 1200 10/16/22 1200  WBC 3.2* 3.1* 11.3 8.0   NEUTROABS 1.4* 2.0 5,639.00 4.3  HGB 10.1* 11.4* 10.2* 9.2*  HCT 31.7* 35.7* 31* 30.1*  MCV 102.3* 103.2*  --  104.5*  PLT 127* 175 317 287   Cardiac Enzymes: Recent Labs    09/28/22 1700  CKTOTAL 58   BNP: Invalid input(s): "POCBNP" No results found for: "HGBA1C" Lab Results  Component Value Date   TSH 0.02 (A) 10/04/2022   Lab Results  Component Value Date   VITAMINB12 1,874 (H) 10/16/2021   Lab Results  Component Value Date   FOLATE 34.8 08/21/2021   Lab Results  Component Value Date   IRON 15 (L) 09/21/2022   TIBC 111 (L) 09/21/2022  FERRITIN 382 (H) 09/21/2022    Imaging and Procedures obtained prior to SNF admission: CT Head Wo Contrast  Result Date: 10/16/2022 CLINICAL DATA:  Syncopal episode. EXAM: CT HEAD WITHOUT CONTRAST TECHNIQUE: Contiguous axial images were obtained from the base of the skull through the vertex without intravenous contrast. RADIATION DOSE REDUCTION: This exam was performed according to the departmental dose-optimization program which includes automated exposure control, adjustment of the mA and/or kV according to patient size and/or use of iterative reconstruction technique. COMPARISON:  Head CT dated 09/28/2022. FINDINGS: Brain: Generalized age related parenchymal volume loss with commensurate dilatation of the ventricles and sulci. Mild chronic small vessel ischemic changes within the bilateral periventricular and subcortical white matter regions. No mass, hemorrhage, edema or other evidence of acute parenchymal abnormality. No extra-axial hemorrhage. Vascular: Chronic calcified atherosclerotic changes of the large vessels at the skull base. No unexpected hyperdense vessel. Skull: Normal. Negative for fracture or focal lesion. Sinuses/Orbits: No acute finding. Other: None. IMPRESSION: 1. No acute findings. No intracranial mass, hemorrhage or edema. 2. Mild chronic small vessel ischemic changes in the white matter. Electronically Signed   By:  Franki Cabot M.D.   On: 10/16/2022 13:55   DG Chest 2 View  Result Date: 10/16/2022 CLINICAL DATA:  Syncope. EXAM: CHEST - 2 VIEW COMPARISON:  CT chest dated October 19, 2020. Chest x-ray dated August 24, 2020. FINDINGS: The heart size and mediastinal contours are within normal limits. Normal pulmonary vascularity. No focal consolidation, pleural effusion, or pneumothorax. No acute osseous abnormality. Old bilateral rib and sternal fractures again noted. IMPRESSION: No active cardiopulmonary disease. Electronically Signed   By: Titus Dubin M.D.   On: 10/16/2022 12:19    Assessment/Plan 1. Syncope and collapse CT showed Small Vessel changes No acute issue Thought to be due to Dehydration She does not remember anything which is unlike her Monitoring her BP Closely for any Orthostatics   2. CKD (chronic kidney disease) stage 4, GFR 15-29 ml/min (HCC) Creat worsen recently Will have to discuss with her and Her POA for Prognosis  3. Anemia due to stage 4 chronic kidney disease (HCC) HGB low but stable Got EPO on last visit with Oncology 4. History of B-cell lymphoma Off Calquence for now  5. Weakness Workign with therapy  6. Weight loss On supplements Albumin very Low  7. Pressure injury of right upper back, stage 2 ([place]) Foam Dressing to protect  8. Other closed displaced fracture of distal end of right humerus with routine healing, subsequent encounter Follow up with Ortho Pain Seemed control just discomfort of Splint  9 Neuropathy/ Trigeminal Neuralgia Has failed previous tapering of Gabapentin Will continue High dose for now But if patient becomes more drowzy have to consider lowering the dose  10 Hypothyroidism TSH is low Dose was Reduced /changed in 09/28/22   11 GERD On Protonix  Family/ staff Communication:   Labs/tests ordered:

## 2022-10-22 ENCOUNTER — Encounter: Payer: Self-pay | Admitting: Orthopedic Surgery

## 2022-10-22 ENCOUNTER — Non-Acute Institutional Stay (SKILLED_NURSING_FACILITY): Payer: Medicare Other | Admitting: Orthopedic Surgery

## 2022-10-22 DIAGNOSIS — Z66 Do not resuscitate: Secondary | ICD-10-CM

## 2022-10-22 DIAGNOSIS — E860 Dehydration: Secondary | ICD-10-CM

## 2022-10-22 DIAGNOSIS — E440 Moderate protein-calorie malnutrition: Secondary | ICD-10-CM | POA: Diagnosis not present

## 2022-10-22 DIAGNOSIS — Z8572 Personal history of non-Hodgkin lymphomas: Secondary | ICD-10-CM

## 2022-10-22 DIAGNOSIS — R55 Syncope and collapse: Secondary | ICD-10-CM | POA: Diagnosis not present

## 2022-10-22 DIAGNOSIS — Z7189 Other specified counseling: Secondary | ICD-10-CM

## 2022-10-22 NOTE — Progress Notes (Addendum)
Location:  [facility] Room Number: 16A Place of Service:  SNF [phone]) Provider:  Windell Moulding, NP   Patient Care Team: Virgie Dad, MD as PCP - General (Internal Medicine)  Extended Emergency Contact Information Primary Emergency Contact: Genia Plants Address: [address]          [place], [place] [address] Johnnette Litter of [place] Phone: [phone] Relation: Alanson Puls Secondary Emergency Contact: [name] Address: [address]          [place], [place] [address] Johnnette Litter of [place] Phone: [phone] Mobile Phone: [phone] Relation: Nephew  Code Status:  Full Code Goals of care: Advanced Directive information    10/22/2022    1:44 PM  Advanced Directives  Does Patient Have a Medical Advance Directive? Yes  Type of Paramedic of Center;Living will  Does patient want to make changes to medical advance directive? No - Patient declined  Copy of [place] in Chart? Yes - validated most recent copy scanned in chart (See row information)     Chief Complaint  Patient presents with   Acute Visit    Dehydration    HPI:  Pt is a 89 y.o. female seen today for acute visit due to ongoing weakness.   She recently moved to skilled nursing at [facility]. PMH: HTN, CKD,GERD, hypothyroidism, h/o frontoparietal SAH with rib fracture, cerebellar infarct, trigeminal neuralgia, osteoporosis, pancytopenia, CLL/ B cell lymphoma.   Recently moved to skilled nursing due to weakness.   12/22 Calquence stopped due to low WBC and worsening kidney function.   12/29 mechanical fall that resulted in right humerus fracture.   01/16 syncopal event while working with PT/OT. ED evaluation> related to dehydration. Albumin < 1.5. BUN/creat 76/2.93.   Today, she had another syncopal event while using bathroom with PT/OT. PT/OT states it was for a brief moment, and then she was verbally responsive. HR 124, BP  76/46, RR 16, 93% on RA, temp 97.3. She was able to get back into bed with assistance. She continues to have poor appetite and does not drink fluids well. Treatment options discussed, she does not want to go to the hospital. HPOA, Eleonor Ocon in agreeable. She is ok to start IV fluids. DNR code status also discussed with patient and HPOA. She agrees to become DNR.    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[place])    Per [facility]   History of B-cell lymphoma 11/02/2015   History of falling    [place]   Hypothyroidism, unspecified    per [facility]   Lymphedema, not elsewhere classified    Per [facility]   Migraines    Muscle weakness (generalized)    [place]   Neuralgia and neuritis, unspecified    [place]   Nontraumatic subarachnoid hemorrhage, unspecified ([place])    per [facility]   Osteoporosis    Pain in right shoulder    Tachycardia, unspecified    [place]   Thyroid disease    Trigeminal neuralgia of left side of face    Unsteadiness on feet  [facility]   Past Surgical History:  Procedure Laterality Date   gamma knife     for trigeminal neuralgia   SHOULDER SURGERY     Right shoulder replacement   TONSILLECTOMY AND ADENOIDECTOMY     WISDOM TOOTH EXTRACTION      Allergies  Allergen Reactions   Penicillins Hives   Tegretol [Carbamazepine] Hives and Other (See Comments)    Turned purple from neck down and drowsy    Lyrica [Pregabalin] Other (See Comments)    Drowsy    Outpatient Encounter Medications as of 10/22/2022  Medication Sig   acetaminophen (TYLENOL) 500 MG tablet Take 500 mg by mouth every 6 (six) hours as needed. Give 1 tablet by mouth three times a day for pain    Amino Acids-Protein Hydrolys (PRO-STAT) LIQD Take 30 mLs by mouth daily.   aspirin 81 MG chewable tablet Chew 81 mg by mouth daily.   Emollient (AQUAPHOR OINTMENT BODY EX) Apply topically. 621HY Give 1 application by mouth at bedtime for dry skin   gabapentin (NEURONTIN) 600 MG tablet Take 600 mg by mouth 3 (three) times daily.   lactose free nutrition (BOOST) LIQD Take 237 mLs by mouth daily.   levothyroxine (SYNTHROID) 75 MCG tablet Take 75 mcg by mouth daily before breakfast.   loperamide (IMODIUM A-D) 2 MG capsule Take 4 mg by mouth every 8 (eight) hours as needed for diarrhea or loose stools.   methocarbamol (ROBAXIN) 500 MG tablet Take 0.5 tablets (250 mg total) by mouth 3 (three) times daily as needed for muscle spasms.   Multiple Vitamin (MULTI-VITAMINS) TABS Take 1 tablet by mouth daily.   pantoprazole (PROTONIX) 40 MG tablet Take 40 mg by mouth 2 (two) times daily.   polyethylene glycol powder (MIRALAX) 17 GM/SCOOP powder Give 17 gram by mouth as needed for Bowel Management   polyvinyl alcohol (LIQUIFILM TEARS) 1.4 % ophthalmic solution Place 1 drop into both eyes 4 (four) times daily as needed.   simethicone (MYLICON) 865 MG chewable tablet Chew 125 mg by mouth 2 (two) times daily as needed for flatulence.   traMADol (ULTRAM) 50 MG tablet Take 25 mg by mouth 3 (three) times daily as needed.   vitamin B-12 (CYANOCOBALAMIN) 1000 MCG tablet Take 1 tablet (1,000 mcg total) by mouth daily.   zinc oxide 20 % ointment Apply 1 application. topically as needed for irritation. To buttocks after every incontinent episode and as needed for redness. May keep at bedside.   Facility-Administered Encounter Medications as of 10/22/2022  Medication   epoetin alfa-epbx (RETACRIT) injection 40,000 Units    Review of Systems  Constitutional:  Positive for activity change, appetite change and fatigue. Negative for fever.  HENT:  Negative for congestion and trouble swallowing.   Eyes:  Negative for  visual disturbance.  Respiratory:  Negative for cough, shortness of breath and wheezing.   Cardiovascular:  Negative for chest pain and leg swelling.  Gastrointestinal:  Negative for abdominal distention, abdominal pain, diarrhea, nausea and vomiting.  Genitourinary:  Negative for dysuria and frequency.  Musculoskeletal:  Positive for arthralgias and gait problem.  Skin:  Positive for wound.  Neurological:  Positive for dizziness, weakness and light-headedness.  Psychiatric/Behavioral:  Positive for dysphoric mood. Negative for confusion. The patient is not nervous/anxious.     Immunization History  Administered Date(s) Administered   Fluad Quad(high Dose 65+) 05/28/2019, 07/25/2022   Influenza Split 07/12/2016, 08/13/2017, 06/02/2019, 09/09/2020   Influenza, High Dose Seasonal PF 06/11/2017, 06/28/2018   Influenza-Unspecified 06/21/2020,  07/25/2021   Moderna SARS-COV2 Booster Vaccination 03/08/2021   Moderna Sars-Covid-2 Vaccination 10/05/2019, 11/02/2019, 08/15/2020   PFIZER(Purple Top)SARS-COV-2 Vaccination 06/21/2021   Pfizer Covid-19 Vaccine Bivalent Booster 19yr & up 08/07/2022   Pneumococcal Conjugate-13 11/22/2014   Pneumococcal Polysaccharide-23 04/29/2009, 08/13/2017   Td 01/24/2013   Td (Adult) 01/24/2013   Tdap 12/28/2017, 07/01/2018, 12/13/2020   Zoster Recombinat (Shingrix) 07/01/2018   Zoster, Live 11/12/2009, 07/01/2018   Zoster, Unspecified 12/17/2005   Pertinent  Health Maintenance Due  Topic Date Due   INFLUENZA VACCINE  Completed   DEXA SCAN  Completed      08/31/2022   12:04 PM 09/21/2022    2:18 PM 09/28/2022    4:34 PM 10/17/2022   10:45 AM 10/22/2022    1:43 PM  Fall Risk  Falls in the past year? 0   0 0  Was there an injury with Fall? 0   0 0  Fall Risk Category Calculator 0   0 0  Fall Risk Category (Retired) Low      (RETIRED) Patient Fall Risk Level Low fall risk High fall risk High fall risk    Patient at Risk for Falls Due to History of  fall(s);Impaired balance/gait   History of fall(s);Impaired balance/gait History of fall(s);Impaired balance/gait  Fall risk Follow up Falls evaluation completed;Education provided   Falls evaluation completed Falls evaluation completed   Functional Status Survey:    Vitals:   10/22/22 1339  BP: 135/73  Pulse: 94  Resp: 18  Temp: (!) 97.2 F (36.2 C)  SpO2: 93%  Weight: 113 lb 5 oz (51.4 kg)  Height: '5\' 6"'$  (1.676 m)   Body mass index is 18.29 kg/m. Physical Exam Vitals reviewed.  Constitutional:      General: She is not in acute distress.    Appearance: She is cachectic.  HENT:     Head: Normocephalic.     Comments: Contusion to right side of head.  Eyes:     General:        Right eye: No discharge.        Left eye: No discharge.  Cardiovascular:     Rate and Rhythm: Regular rhythm. Tachycardia present.     Pulses: Normal pulses.     Heart sounds: Normal heart sounds.  Pulmonary:     Effort: Pulmonary effort is normal. No respiratory distress.     Breath sounds: Normal breath sounds.  Abdominal:     General: Bowel sounds are normal. There is no distension.     Palpations: Abdomen is soft.     Tenderness: There is no abdominal tenderness.  Musculoskeletal:     Cervical back: Neck supple.     Right lower leg: No edema.     Left lower leg: No edema.  Skin:    General: Skin is warm and dry.     Capillary Refill: Capillary refill takes less than 2 seconds.  Neurological:     General: No focal deficit present.     Mental Status: She is alert and oriented to person, place, and time.     Motor: Weakness present.     Gait: Gait abnormal.     Comments: wheelchair  Psychiatric:        Mood and Affect: Mood normal.        Behavior: Behavior normal.     Labs reviewed: Recent Labs    09/21/22 1358 09/28/22 1700 10/04/22 0000 10/12/22 1200 10/16/22 1200  NA 139 132*  --  135* 136  K 5.0 5.2*  --  5.1 4.3  CL 107 106  --  104 109  CO2 19* 17*  --  19 20*   GLUCOSE 105* 118*  --   --  208*  BUN 73* 68*  --  80* 76*  CREATININE 3.33* 3.13* 3.3* 3.3* 2.93*  CALCIUM 8.6* 7.9*  --  8.2* 7.4*   Recent Labs    09/21/22 1358 09/28/22 1700 10/12/22 1200 10/16/22 1200  AST 10* 145* 10* 14*  ALT 7 62* 7 8  ALKPHOS 75 84 111 96  BILITOT 0.3 0.3  --  0.2*  PROT 5.9* 5.6*  --  5.8*  ALBUMIN 2.5* 1.6* 2.1* <1.5*   Recent Labs    09/21/22 1358 09/28/22 1700 10/12/22 1200 10/16/22 1200  WBC 3.2* 3.1* 11.3 8.0  NEUTROABS 1.4* 2.0 5,639.00 4.3  HGB 10.1* 11.4* 10.2* 9.2*  HCT 31.7* 35.7* 31* 30.1*  MCV 102.3* 103.2*  --  104.5*  PLT 127* 175 317 287   Lab Results  Component Value Date   TSH 0.02 (A) 10/04/2022   No results found for: "HGBA1C" No results found for: "CHOL", "HDL", "LDLCALC", "LDLDIRECT", "TRIG", "CHOLHDL"  Significant Diagnostic Results in last 30 days:  CT Head Wo Contrast  Result Date: 10/16/2022 CLINICAL DATA:  Syncopal episode. EXAM: CT HEAD WITHOUT CONTRAST TECHNIQUE: Contiguous axial images were obtained from the base of the skull through the vertex without intravenous contrast. RADIATION DOSE REDUCTION: This exam was performed according to the departmental dose-optimization program which includes automated exposure control, adjustment of the mA and/or kV according to patient size and/or use of iterative reconstruction technique. COMPARISON:  Head CT dated 09/28/2022. FINDINGS: Brain: Generalized age related parenchymal volume loss with commensurate dilatation of the ventricles and sulci. Mild chronic small vessel ischemic changes within the bilateral periventricular and subcortical white matter regions. No mass, hemorrhage, edema or other evidence of acute parenchymal abnormality. No extra-axial hemorrhage. Vascular: Chronic calcified atherosclerotic changes of the large vessels at the skull base. No unexpected hyperdense vessel. Skull: Normal. Negative for fracture or focal lesion. Sinuses/Orbits: No acute finding. Other:  None. IMPRESSION: 1. No acute findings. No intracranial mass, hemorrhage or edema. 2. Mild chronic small vessel ischemic changes in the white matter. Electronically Signed   By: Franki Cabot M.D.   On: 10/16/2022 13:55   DG Chest 2 View  Result Date: 10/16/2022 CLINICAL DATA:  Syncope. EXAM: CHEST - 2 VIEW COMPARISON:  CT chest dated October 19, 2020. Chest x-ray dated August 24, 2020. FINDINGS: The heart size and mediastinal contours are within normal limits. Normal pulmonary vascularity. No focal consolidation, pleural effusion, or pneumothorax. No acute osseous abnormality. Old bilateral rib and sternal fractures again noted. IMPRESSION: No active cardiopulmonary disease. Electronically Signed   By: Titus Dubin M.D.   On: 10/16/2022 12:19   DG Humerus Right  Result Date: 09/28/2022 CLINICAL DATA:  fall, obvious deformity EXAM: RIGHT ELBOW - COMPLETE 3+ VIEW; RIGHT HUMERUS - 2+ VIEW COMPARISON:  X-ray right shoulder 09/03/2016 FINDINGS: Right humerus: Acute comminuted and displaced spiral fracture of the distal humeral shaft distal to a total right shoulder arthroplasty. No radiographic findings suggest surgical hardware complication. No periprosthetic lucency. Right elbow: No evidence of fracture, dislocation, or joint effusion. No evidence of severe arthropathy. No aggressive appearing focal bone abnormality. Soft tissues are unremarkable. IMPRESSION: 1. Acute comminuted and displaced spiral fracture of the distal humeral shaft distal to a total right shoulder arthroplasty. 2. No acute displaced fracture or  dislocation of the bones of the right elbow. Electronically Signed   By: Iven Finn M.D.   On: 09/28/2022 17:50   DG Elbow Complete Right  Result Date: 09/28/2022 CLINICAL DATA:  fall, obvious deformity EXAM: RIGHT ELBOW - COMPLETE 3+ VIEW; RIGHT HUMERUS - 2+ VIEW COMPARISON:  X-ray right shoulder 09/03/2016 FINDINGS: Right humerus: Acute comminuted and displaced spiral fracture of  the distal humeral shaft distal to a total right shoulder arthroplasty. No radiographic findings suggest surgical hardware complication. No periprosthetic lucency. Right elbow: No evidence of fracture, dislocation, or joint effusion. No evidence of severe arthropathy. No aggressive appearing focal bone abnormality. Soft tissues are unremarkable. IMPRESSION: 1. Acute comminuted and displaced spiral fracture of the distal humeral shaft distal to a total right shoulder arthroplasty. 2. No acute displaced fracture or dislocation of the bones of the right elbow. Electronically Signed   By: Iven Finn M.D.   On: 09/28/2022 17:50   CT Head Wo Contrast  Result Date: 09/28/2022 CLINICAL DATA:  Head trauma, moderate-severe; Neck trauma (Age >= 65y) EXAM: CT HEAD WITHOUT CONTRAST CT CERVICAL SPINE WITHOUT CONTRAST TECHNIQUE: Multidetector CT imaging of the head and cervical spine was performed following the standard protocol without intravenous contrast. Multiplanar CT image reconstructions of the cervical spine were also generated. RADIATION DOSE REDUCTION: This exam was performed according to the departmental dose-optimization program which includes automated exposure control, adjustment of the mA and/or kV according to patient size and/or use of iterative reconstruction technique. COMPARISON:  None Available. FINDINGS: CT HEAD FINDINGS Brain: Cerebral ventricle sizes are concordant with the degree of cerebral volume loss. Patchy and confluent areas of decreased attenuation are noted throughout the deep and periventricular white matter of the cerebral hemispheres bilaterally, compatible with chronic microvascular ischemic disease. No evidence of large-territorial acute infarction. No parenchymal hemorrhage. No mass lesion. No extra-axial collection. No mass effect or midline shift. No hydrocephalus. Basilar cisterns are patent. Vascular: No hyperdense vessel. Skull: No acute fracture or focal lesion.  Sinuses/Orbits: Paranasal sinuses and mastoid air cells are clear. Bilateral lens replacement. Otherwise the orbits are unremarkable. Other: 3 mm right frontal scalp hematoma formation. CT CERVICAL SPINE FINDINGS Alignment: Reversal normal cervical lordosis at the C6 level likely due to positioning and degenerative changes. Grade 1 anterolisthesis C2 on C3, C3 on C4, C4 on C5, C5 on C6. Mild retrolisthesis of C6 on C7. Skull base and vertebrae: Multilevel severe degenerative changes most prominent at the C6-C7 level. Associated multilevel moderate to severe osseous neural foraminal stenosis. No associated severe osseous central canal stenosis. No acute fracture. No aggressive appearing focal osseous lesion or focal pathologic process. Soft tissues and spinal canal: No prevertebral fluid or swelling. No visible canal hematoma. Upper chest: Biapical pleural/pulmonary scarring. Other: Atherosclerotic plaque of the carotid arteries within the neck. IMPRESSION: 1. No acute intracranial abnormality. 2. No acute displaced fracture or traumatic listhesis of the cervical spine. Electronically Signed   By: Iven Finn M.D.   On: 09/28/2022 17:42   CT Cervical Spine Wo Contrast  Result Date: 09/28/2022 CLINICAL DATA:  Head trauma, moderate-severe; Neck trauma (Age >= 65y) EXAM: CT HEAD WITHOUT CONTRAST CT CERVICAL SPINE WITHOUT CONTRAST TECHNIQUE: Multidetector CT imaging of the head and cervical spine was performed following the standard protocol without intravenous contrast. Multiplanar CT image reconstructions of the cervical spine were also generated. RADIATION DOSE REDUCTION: This exam was performed according to the departmental dose-optimization program which includes automated exposure control, adjustment of the mA and/or kV according  to patient size and/or use of iterative reconstruction technique. COMPARISON:  None Available. FINDINGS: CT HEAD FINDINGS Brain: Cerebral ventricle sizes are concordant with the  degree of cerebral volume loss. Patchy and confluent areas of decreased attenuation are noted throughout the deep and periventricular white matter of the cerebral hemispheres bilaterally, compatible with chronic microvascular ischemic disease. No evidence of large-territorial acute infarction. No parenchymal hemorrhage. No mass lesion. No extra-axial collection. No mass effect or midline shift. No hydrocephalus. Basilar cisterns are patent. Vascular: No hyperdense vessel. Skull: No acute fracture or focal lesion. Sinuses/Orbits: Paranasal sinuses and mastoid air cells are clear. Bilateral lens replacement. Otherwise the orbits are unremarkable. Other: 3 mm right frontal scalp hematoma formation. CT CERVICAL SPINE FINDINGS Alignment: Reversal normal cervical lordosis at the C6 level likely due to positioning and degenerative changes. Grade 1 anterolisthesis C2 on C3, C3 on C4, C4 on C5, C5 on C6. Mild retrolisthesis of C6 on C7. Skull base and vertebrae: Multilevel severe degenerative changes most prominent at the C6-C7 level. Associated multilevel moderate to severe osseous neural foraminal stenosis. No associated severe osseous central canal stenosis. No acute fracture. No aggressive appearing focal osseous lesion or focal pathologic process. Soft tissues and spinal canal: No prevertebral fluid or swelling. No visible canal hematoma. Upper chest: Biapical pleural/pulmonary scarring. Other: Atherosclerotic plaque of the carotid arteries within the neck. IMPRESSION: 1. No acute intracranial abnormality. 2. No acute displaced fracture or traumatic listhesis of the cervical spine. Electronically Signed   By: Iven Finn M.D.   On: 09/28/2022 17:42    Assessment/Plan 1. Dehydration - HR 124, blood pressure 76/46 - BUN/creat 18/1.00 10/22/2022 - insert IV - start NS 0/.9% @ 75cc/hr - encourage oral fluids  - refused hospital at this time  2. Syncope and collapse - ongoing - related to  orthostatic/dehydration - see above  3. [place] protein-calorie malnutrition (HCC) - BMI 18.29 - albumin < 1.5 - evaluated by dietary - cont magic cups BID - cont Ensure daily - add chocolate milkshake daily   4. History of B-cell lymphoma - followed by oncology - off Calquence due to low WBC - recommend hospice referral> family would like to see oncology first > scheduled 01/24  5. DNR (do not resuscitate) discussion - discussed chronic conditions, advanced age and poor nutritional status  - patient and HPOA agreeable to DNR code status - Do not attempt resuscitation (DNR)  6. DNR (do not resuscitate) - Do not attempt resuscitation (DNR)  Total time: 50 minutes    Family/ staff Communication: plan discussed with patient, HPOA and nurse  Labs/tests ordered:  none

## 2022-10-23 ENCOUNTER — Encounter: Payer: Self-pay | Admitting: Nurse Practitioner

## 2022-10-23 ENCOUNTER — Non-Acute Institutional Stay (SKILLED_NURSING_FACILITY): Payer: Medicare Other | Admitting: Nurse Practitioner

## 2022-10-23 DIAGNOSIS — K219 Gastro-esophageal reflux disease without esophagitis: Secondary | ICD-10-CM | POA: Diagnosis not present

## 2022-10-23 DIAGNOSIS — E039 Hypothyroidism, unspecified: Secondary | ICD-10-CM

## 2022-10-23 DIAGNOSIS — G500 Trigeminal neuralgia: Secondary | ICD-10-CM

## 2022-10-23 DIAGNOSIS — E860 Dehydration: Secondary | ICD-10-CM | POA: Insufficient documentation

## 2022-10-23 DIAGNOSIS — D630 Anemia in neoplastic disease: Secondary | ICD-10-CM

## 2022-10-23 DIAGNOSIS — R55 Syncope and collapse: Secondary | ICD-10-CM | POA: Diagnosis not present

## 2022-10-23 DIAGNOSIS — N1832 Chronic kidney disease, stage 3b: Secondary | ICD-10-CM | POA: Diagnosis not present

## 2022-10-23 DIAGNOSIS — I10 Essential (primary) hypertension: Secondary | ICD-10-CM

## 2022-10-23 DIAGNOSIS — Z8572 Personal history of non-Hodgkin lymphomas: Secondary | ICD-10-CM

## 2022-10-23 NOTE — Progress Notes (Deleted)
This encounter was created in error - please disregard.

## 2022-10-23 NOTE — Assessment & Plan Note (Signed)
Marginal zone lymphoma, f/u oncology, off Calquence

## 2022-10-23 NOTE — Progress Notes (Signed)
This encounter was created in error - please disregard.

## 2022-10-23 NOTE — Assessment & Plan Note (Signed)
Bun/creat 76/2.93 10/16/22>>18/1.0 10/22/22

## 2022-10-23 NOTE — Assessment & Plan Note (Signed)
takes Pantoprazole

## 2022-10-23 NOTE — Progress Notes (Signed)
Location:   SNF [place] Room Number: 16 Place of Service:  SNF (31) Provider: [facility] Nikki Glanzer NP  Virgie Dad, MD  Patient Care Team: Virgie Dad, MD as PCP - General (Internal Medicine)  Extended Emergency Contact Information Primary Emergency Contact: Genia Plants Address: [address]          [place], [place] [address] Johnnette Litter of [place] Phone: [phone] Relation: Alanson Puls Secondary Emergency Contact: [name] Address: [address]          [place], [place] [address] Johnnette Litter of [place] Phone: [phone] Mobile Phone: [phone] Relation: Nephew  Code Status:  DNR Goals of care: Advanced Directive information    10/23/2022   10:27 AM  Advanced Directives  Does Patient Have a Medical Advance Directive? Yes  Type of Paramedic of [place];Living will  Does patient want to make changes to medical advance directive? No - Patient declined  Copy of [place] in Chart? Yes - validated most recent copy scanned in chart (See row information)     Chief Complaint  Patient presents with   Acute Visit    Rapid heart beats, dehydration.     HPI:  Pt is a 89 y.o. female seen today for an acute visit for 10/22/22 tachycardia HR 124>>105 today, dehydration/Bp 76/46<<134/78 today-s/p IVF 1092m, declined hospitalization. Bun/creat 76/2.93 10/16/22>>18/1.0 10/22/22  Syncope, ongoing issue, CT showed small vesel changes, no acute issue.   GERD, takes Pantoprazole  CKD, Bun/creat 76/2.93 10/16/22>>18/1.0 10/22/22             Marginal zone lymphoma, f/u oncology, off Calquence             Hyponatremia  Na 136 10/16/22             Hypothyroidism, dose decreased 12/29/23of Levothyroxine, f/u TSH 0.02 10/04/22             Chronic left cerebellar infarct, hx of, no apparent focal residual.              Gait abnormality, uses walker.              Frequent falls             Anemia, Hgb 9.2 10/16/22, Retacrit.              OP DEXA 02/25/20 tscore -3.8             Migraines, off Propranolol.              HTN recommended MAP range 70-90, off Propranolol             Trigeminal neuralgia, takes  Gabapentin.              Bartholin gland cysts on each labia per report.         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  Lymphedema, not  elsewhere classified    Per [facility]   Migraines    Muscle weakness (generalized)    [place]   Neuralgia and neuritis, unspecified    [place]   Nontraumatic subarachnoid hemorrhage, unspecified ([place])    per [facility]   Osteoporosis    Pain in right shoulder    Tachycardia, unspecified    [place]   Thyroid disease    Trigeminal neuralgia of left side of face    Unsteadiness on feet    [facility]   Past Surgical History:  Procedure Laterality Date   gamma knife     for trigeminal neuralgia   SHOULDER SURGERY     Right shoulder replacement   TONSILLECTOMY AND ADENOIDECTOMY     WISDOM TOOTH EXTRACTION      Allergies  Allergen Reactions   Penicillins Hives   Tegretol [Carbamazepine] Hives and Other (See Comments)    Turned purple from neck down and drowsy    Lyrica [Pregabalin] Other (See Comments)    Drowsy    Allergies as of 10/23/2022       Reactions   Penicillins Hives   Tegretol [carbamazepine] Hives, Other (See Comments)   Turned purple from neck down and drowsy   Lyrica [pregabalin] Other (See Comments)   Drowsy        Medication List        Accurate as of October 23, 2022 11:56 AM. If you have any  questions, ask your nurse or doctor.          acetaminophen 500 MG tablet Commonly known as: TYLENOL Take 500 mg by mouth every 6 (six) hours as needed. Give 1 tablet by mouth three times a day for pain   AQUAPHOR OINTMENT BODY EX Apply topically. [id number] Give 1 application by mouth at bedtime for dry skin   aspirin 81 MG chewable tablet Chew 81 mg by mouth daily.   cyanocobalamin 1000 MCG tablet Commonly known as: VITAMIN B12 Take 1 tablet (1,000 mcg total) by mouth daily.   gabapentin 600 MG tablet Commonly known as: NEURONTIN Take 600 mg by mouth 3 (three) times daily.   Imodium A-D 2 MG capsule Generic drug: loperamide Take 4 mg by mouth every 8 (eight) hours as needed for diarrhea or loose stools.   lactose free nutrition Liqd Take 237 mLs by mouth daily.   levothyroxine 75 MCG tablet Commonly known as: SYNTHROID Take 75 mcg by mouth daily before breakfast.   methocarbamol 500 MG tablet Commonly known as: ROBAXIN Take 0.5 tablets (250 mg total) by mouth 3 (three) times daily as needed for muscle spasms.   MiraLax 17 GM/SCOOP powder Generic drug: polyethylene glycol powder Give 17 gram by mouth as needed for Bowel Management   Multi-Vitamins Tabs Take 1 tablet by mouth daily.   pantoprazole 40 MG tablet Commonly known as: PROTONIX Take 40 mg by mouth 2 (two) times daily.   polyvinyl alcohol 1.4 % ophthalmic solution Commonly known as: LIQUIFILM TEARS Place 1 drop into both eyes 4 (four) times daily as needed.   Pro-Stat Liqd Take 30 mLs by mouth daily.   simethicone 125 MG chewable tablet Commonly known as: MYLICON Chew 170 mg by mouth 2 (two) times daily as needed for flatulence.   traMADol 50 MG tablet Commonly known as: ULTRAM Take 25 mg by mouth 3 (three) times daily as needed.   zinc oxide 20 % ointment Apply 1 application. topically as needed for irritation. To buttocks after every incontinent episode and as needed for  redness. May keep at  bedside.        Review of Systems  Constitutional:  Positive for appetite change and fatigue. Negative for fever.  HENT:  Positive for hearing loss. Negative for congestion and trouble swallowing.   Eyes:  Negative for visual disturbance.  Respiratory:  Negative for cough and shortness of breath.   Cardiovascular:  Negative for palpitations and leg swelling.  Gastrointestinal:  Negative for abdominal pain and constipation.  Genitourinary:  Negative for dysuria, frequency and urgency.       Bartholin cysts per history  Musculoskeletal:  Positive for arthralgias, back pain, gait problem and joint swelling.       Ambulates with walker. C/o R hip to R ankle pain, more at night, less when she is up/walking but she felt it. Right arm pain.   Skin:  Positive for wound.  Neurological:  Negative for speech difficulty, weakness and headaches.  Psychiatric/Behavioral:  Positive for confusion. Negative for sleep disturbance. The patient is not nervous/anxious.     Immunization History  Administered Date(s) Administered   Fluad Quad(high Dose 65+) 05/28/2019, 07/25/2022   Influenza Split 07/12/2016, 08/13/2017, 06/02/2019, 09/09/2020   Influenza, High Dose Seasonal PF 06/11/2017, 06/28/2018   Influenza-Unspecified 06/21/2020, 07/25/2021   Moderna SARS-COV2 Booster Vaccination 03/08/2021   Moderna Sars-Covid-2 Vaccination 10/05/2019, 11/02/2019, 08/15/2020   PFIZER(Purple Top)SARS-COV-2 Vaccination 06/21/2021   Pfizer Covid-19 Vaccine Bivalent Booster 61yr & up 08/07/2022   Pneumococcal Conjugate-13 11/22/2014   Pneumococcal Polysaccharide-23 04/29/2009, 08/13/2017   Td 01/24/2013   Td (Adult) 01/24/2013   Tdap 12/28/2017, 07/01/2018, 12/13/2020   Zoster Recombinat (Shingrix) 07/01/2018   Zoster, Live 11/12/2009, 07/01/2018   Zoster, Unspecified 12/17/2005   Pertinent  Health Maintenance Due  Topic Date Due   INFLUENZA VACCINE  Completed   DEXA SCAN  Completed      09/21/2022     2:18 PM 09/28/2022    4:34 PM 10/17/2022   10:45 AM 10/22/2022    1:43 PM 10/23/2022   10:27 AM  Fall Risk  Falls in the past year?   0 0 1  Was there an injury with Fall?   0 0 0  Fall Risk Category Calculator   0 0 2  (RETIRED) Patient Fall Risk Level High fall risk High fall risk     Patient at Risk for Falls Due to   History of fall(s);Impaired balance/gait History of fall(s);Impaired balance/gait History of fall(s);Impaired balance/gait  Fall risk Follow up   Falls evaluation completed Falls evaluation completed Falls evaluation completed   Functional Status Survey:    Vitals:   10/23/22 1144  BP: 134/78  Pulse: (!) 105  Resp: 18  Temp: 98.5 F (36.9 C)  SpO2: 96%  Weight: 113 lb 8 oz (51.5 kg)   Body mass index is 18.32 kg/m. Physical Exam Vitals and nursing note reviewed.  Constitutional:      Comments: weak  HENT:     Nose: Nose normal.     Mouth/Throat:     Mouth: Mucous membranes are dry.  Eyes:     Extraocular Movements: Extraocular movements intact.     Conjunctiva/sclera: Conjunctivae normal.     Pupils: Pupils are equal, round, and reactive to light.  Cardiovascular:     Rate and Rhythm: Regular rhythm. Tachycardia present.     Heart sounds: Murmur heard.     Comments: HR 105 Pulmonary:     Effort: Pulmonary effort is normal.     Breath sounds: Normal breath sounds. No  wheezing, rhonchi or rales.  Abdominal:     General: Bowel sounds are normal.     Palpations: Abdomen is soft.     Tenderness: There is no abdominal tenderness.  Musculoskeletal:     Cervical back: Normal range of motion and neck supple.     Right lower leg: Edema present.     Left lower leg: Edema present.     Comments:  Chronic R hip to R ankle pain, R SIJ region intermittent. R elbow pain, in long splint, left elbow, forearm, and hand swelling, able to make a fist, denied tingling or numbness.   Skin:    General: Skin is warm and dry.     Findings: Bruising present.     Comments:  Mild pigmented skin changes BLE. Right forehead bruise.   Neurological:     General: No focal deficit present.     Mental Status: She is alert and oriented to person, place, and time. Mental status is at baseline.     Gait: Gait abnormal.  Psychiatric:        Mood and Affect: Mood normal.        Behavior: Behavior normal.        Thought Content: Thought content normal.     Labs reviewed: Recent Labs    09/21/22 1358 09/28/22 1700 10/04/22 0000 10/12/22 1200 10/16/22 1200  NA 139 132*  --  135* 136  K 5.0 5.2*  --  5.1 4.3  CL 107 106  --  104 109  CO2 19* 17*  --  19 20*  GLUCOSE 105* 118*  --   --  208*  BUN 73* 68*  --  80* 76*  CREATININE 3.33* 3.13* 3.3* 3.3* 2.93*  CALCIUM 8.6* 7.9*  --  8.2* 7.4*   Recent Labs    09/21/22 1358 09/28/22 1700 10/12/22 1200 10/16/22 1200  AST 10* 145* 10* 14*  ALT 7 62* 7 8  ALKPHOS 75 84 111 96  BILITOT 0.3 0.3  --  0.2*  PROT 5.9* 5.6*  --  5.8*  ALBUMIN 2.5* 1.6* 2.1* <1.5*   Recent Labs    09/21/22 1358 09/28/22 1700 10/12/22 1200 10/16/22 1200  WBC 3.2* 3.1* 11.3 8.0  NEUTROABS 1.4* 2.0 5,639.00 4.3  HGB 10.1* 11.4* 10.2* 9.2*  HCT 31.7* 35.7* 31* 30.1*  MCV 102.3* 103.2*  --  104.5*  PLT 127* 175 317 287   Lab Results  Component Value Date   TSH 0.02 (A) 10/04/2022   No results found for: "HGBA1C" No results found for: "CHOL", "HDL", "LDLCALC", "LDLDIRECT", "TRIG", "CHOLHDL"  Significant Diagnostic Results in last 30 days:  CT Head Wo Contrast  Result Date: 10/16/2022 CLINICAL DATA:  Syncopal episode. EXAM: CT HEAD WITHOUT CONTRAST TECHNIQUE: Contiguous axial images were obtained from the base of the skull through the vertex without intravenous contrast. RADIATION DOSE REDUCTION: This exam was performed according to the departmental dose-optimization program which includes automated exposure control, adjustment of the mA and/or kV according to patient size and/or use of iterative reconstruction technique.  COMPARISON:  Head CT dated 09/28/2022. FINDINGS: Brain: Generalized age related parenchymal volume loss with commensurate dilatation of the ventricles and sulci. Mild chronic small vessel ischemic changes within the bilateral periventricular and subcortical white matter regions. No mass, hemorrhage, edema or other evidence of acute parenchymal abnormality. No extra-axial hemorrhage. Vascular: Chronic calcified atherosclerotic changes of the large vessels at the skull base. No unexpected hyperdense vessel. Skull: Normal. Negative for fracture or focal  lesion. Sinuses/Orbits: No acute finding. Other: None. IMPRESSION: 1. No acute findings. No intracranial mass, hemorrhage or edema. 2. Mild chronic small vessel ischemic changes in the white matter. Electronically Signed   By: Franki Cabot M.D.   On: 10/16/2022 13:55   DG Chest 2 View  Result Date: 10/16/2022 CLINICAL DATA:  Syncope. EXAM: CHEST - 2 VIEW COMPARISON:  CT chest dated October 19, 2020. Chest x-ray dated August 24, 2020. FINDINGS: The heart size and mediastinal contours are within normal limits. Normal pulmonary vascularity. No focal consolidation, pleural effusion, or pneumothorax. No acute osseous abnormality. Old bilateral rib and sternal fractures again noted. IMPRESSION: No active cardiopulmonary disease. Electronically Signed   By: Titus Dubin M.D.   On: 10/16/2022 12:19   DG Humerus Right  Result Date: 09/28/2022 CLINICAL DATA:  fall, obvious deformity EXAM: RIGHT ELBOW - COMPLETE 3+ VIEW; RIGHT HUMERUS - 2+ VIEW COMPARISON:  X-ray right shoulder 09/03/2016 FINDINGS: Right humerus: Acute comminuted and displaced spiral fracture of the distal humeral shaft distal to a total right shoulder arthroplasty. No radiographic findings suggest surgical hardware complication. No periprosthetic lucency. Right elbow: No evidence of fracture, dislocation, or joint effusion. No evidence of severe arthropathy. No aggressive appearing focal bone  abnormality. Soft tissues are unremarkable. IMPRESSION: 1. Acute comminuted and displaced spiral fracture of the distal humeral shaft distal to a total right shoulder arthroplasty. 2. No acute displaced fracture or dislocation of the bones of the right elbow. Electronically Signed   By: Iven Finn M.D.   On: 09/28/2022 17:50   DG Elbow Complete Right  Result Date: 09/28/2022 CLINICAL DATA:  fall, obvious deformity EXAM: RIGHT ELBOW - COMPLETE 3+ VIEW; RIGHT HUMERUS - 2+ VIEW COMPARISON:  X-ray right shoulder 09/03/2016 FINDINGS: Right humerus: Acute comminuted and displaced spiral fracture of the distal humeral shaft distal to a total right shoulder arthroplasty. No radiographic findings suggest surgical hardware complication. No periprosthetic lucency. Right elbow: No evidence of fracture, dislocation, or joint effusion. No evidence of severe arthropathy. No aggressive appearing focal bone abnormality. Soft tissues are unremarkable. IMPRESSION: 1. Acute comminuted and displaced spiral fracture of the distal humeral shaft distal to a total right shoulder arthroplasty. 2. No acute displaced fracture or dislocation of the bones of the right elbow. Electronically Signed   By: Iven Finn M.D.   On: 09/28/2022 17:50   CT Head Wo Contrast  Result Date: 09/28/2022 CLINICAL DATA:  Head trauma, moderate-severe; Neck trauma (Age >= 65y) EXAM: CT HEAD WITHOUT CONTRAST CT CERVICAL SPINE WITHOUT CONTRAST TECHNIQUE: Multidetector CT imaging of the head and cervical spine was performed following the standard protocol without intravenous contrast. Multiplanar CT image reconstructions of the cervical spine were also generated. RADIATION DOSE REDUCTION: This exam was performed according to the departmental dose-optimization program which includes automated exposure control, adjustment of the mA and/or kV according to patient size and/or use of iterative reconstruction technique. COMPARISON:  None Available.  FINDINGS: CT HEAD FINDINGS Brain: Cerebral ventricle sizes are concordant with the degree of cerebral volume loss. Patchy and confluent areas of decreased attenuation are noted throughout the deep and periventricular white matter of the cerebral hemispheres bilaterally, compatible with chronic microvascular ischemic disease. No evidence of large-territorial acute infarction. No parenchymal hemorrhage. No mass lesion. No extra-axial collection. No mass effect or midline shift. No hydrocephalus. Basilar cisterns are patent. Vascular: No hyperdense vessel. Skull: No acute fracture or focal lesion. Sinuses/Orbits: Paranasal sinuses and mastoid air cells are clear. Bilateral lens replacement. Otherwise the  orbits are unremarkable. Other: 3 mm right frontal scalp hematoma formation. CT CERVICAL SPINE FINDINGS Alignment: Reversal normal cervical lordosis at the C6 level likely due to positioning and degenerative changes. Grade 1 anterolisthesis C2 on C3, C3 on C4, C4 on C5, C5 on C6. Mild retrolisthesis of C6 on C7. Skull base and vertebrae: Multilevel severe degenerative changes most prominent at the C6-C7 level. Associated multilevel moderate to severe osseous neural foraminal stenosis. No associated severe osseous central canal stenosis. No acute fracture. No aggressive appearing focal osseous lesion or focal pathologic process. Soft tissues and spinal canal: No prevertebral fluid or swelling. No visible canal hematoma. Upper chest: Biapical pleural/pulmonary scarring. Other: Atherosclerotic plaque of the carotid arteries within the neck. IMPRESSION: 1. No acute intracranial abnormality. 2. No acute displaced fracture or traumatic listhesis of the cervical spine. Electronically Signed   By: Iven Finn M.D.   On: 09/28/2022 17:42   CT Cervical Spine Wo Contrast  Result Date: 09/28/2022 CLINICAL DATA:  Head trauma, moderate-severe; Neck trauma (Age >= 65y) EXAM: CT HEAD WITHOUT CONTRAST CT CERVICAL SPINE WITHOUT  CONTRAST TECHNIQUE: Multidetector CT imaging of the head and cervical spine was performed following the standard protocol without intravenous contrast. Multiplanar CT image reconstructions of the cervical spine were also generated. RADIATION DOSE REDUCTION: This exam was performed according to the departmental dose-optimization program which includes automated exposure control, adjustment of the mA and/or kV according to patient size and/or use of iterative reconstruction technique. COMPARISON:  None Available. FINDINGS: CT HEAD FINDINGS Brain: Cerebral ventricle sizes are concordant with the degree of cerebral volume loss. Patchy and confluent areas of decreased attenuation are noted throughout the deep and periventricular white matter of the cerebral hemispheres bilaterally, compatible with chronic microvascular ischemic disease. No evidence of large-territorial acute infarction. No parenchymal hemorrhage. No mass lesion. No extra-axial collection. No mass effect or midline shift. No hydrocephalus. Basilar cisterns are patent. Vascular: No hyperdense vessel. Skull: No acute fracture or focal lesion. Sinuses/Orbits: Paranasal sinuses and mastoid air cells are clear. Bilateral lens replacement. Otherwise the orbits are unremarkable. Other: 3 mm right frontal scalp hematoma formation. CT CERVICAL SPINE FINDINGS Alignment: Reversal normal cervical lordosis at the C6 level likely due to positioning and degenerative changes. Grade 1 anterolisthesis C2 on C3, C3 on C4, C4 on C5, C5 on C6. Mild retrolisthesis of C6 on C7. Skull base and vertebrae: Multilevel severe degenerative changes most prominent at the C6-C7 level. Associated multilevel moderate to severe osseous neural foraminal stenosis. No associated severe osseous central canal stenosis. No acute fracture. No aggressive appearing focal osseous lesion or focal pathologic process. Soft tissues and spinal canal: No prevertebral fluid or swelling. No visible canal  hematoma. Upper chest: Biapical pleural/pulmonary scarring. Other: Atherosclerotic plaque of the carotid arteries within the neck. IMPRESSION: 1. No acute intracranial abnormality. 2. No acute displaced fracture or traumatic listhesis of the cervical spine. Electronically Signed   By: Iven Finn M.D.   On: 09/28/2022 17:42    Assessment/Plan Dehydration 10/22/22 tachycardia HR 124>>105 today, dehydration/Bp 76/46<<134/78 today-s/p IVF 1036m, declined hospitalization. Bun/creat 76/2.93 10/16/22>>18/1.0 10/22/22 Improving, continue encouraging oral fluid intake  Syncope ongoing issue, CT showed small vesel changes, no acute issue, ? Dehydration  GERD (gastroesophageal reflux disease)  takes Pantoprazole  CKD (chronic kidney disease) stage 3, GFR 30-59 ml/min (HCC) Bun/creat 76/2.93 10/16/22>>18/1.0 10/22/22  History of B-cell lymphoma Marginal zone lymphoma, f/u oncology, off Calquence  Hypothyroidism  dose decreased 12/29/23of Levothyroxine, f/u TSH 0.02 1/4/2  Anemia in  neoplastic disease Hgb 9.2 10/16/22, Retacrit.  HTN (hypertension)  recommended MAP range 70-90, off Propranolol    Family/ staff Communication: plan of care reviewed with the patient and charge nurse.   Labs/tests ordered:  none  Time spend 35 minutes

## 2022-10-23 NOTE — Assessment & Plan Note (Signed)
dose decreased 12/29/23of Levothyroxine, f/u TSH 0.02 1/4/2

## 2022-10-23 NOTE — Assessment & Plan Note (Signed)
recommended MAP range 70-90, off Propranolol

## 2022-10-23 NOTE — Assessment & Plan Note (Addendum)
10/22/22 tachycardia HR 124>>105 today, dehydration/Bp 76/46<<134/78 today-s/p IVF 103m, declined hospitalization. Bun/creat 76/2.93 10/16/22>>18/1.0 10/22/22 Improving, continue encouraging oral fluid intake

## 2022-10-23 NOTE — Assessment & Plan Note (Signed)
Hgb 9.2 10/16/22, Retacrit.

## 2022-10-23 NOTE — Assessment & Plan Note (Signed)
Trigeminal neuralgia, takes  Gabapentin-failed GDR in the past.

## 2022-10-23 NOTE — Assessment & Plan Note (Signed)
ongoing issue, CT showed small vesel changes, no acute issue, ? Dehydration

## 2022-10-23 NOTE — Progress Notes (Deleted)
Location:  [facility] Room Number: NO/16/A Place of Service:  SNF (31) Provider:  Sharae Zappulla X, NP  Patient Care Team: Virgie Dad, MD as PCP - General (Internal Medicine)  Extended Emergency Contact Information Primary Emergency Contact: Genia Plants Address: [address]          [place], [place] [address] Johnnette Litter of [place] Phone: [phone] Relation: Alanson Puls Secondary Emergency Contact: [name] Address: [address]          [place], [place] [address] Johnnette Litter of [place] Phone: [phone] Mobile Phone: [phone] Relation: Nephew  Code Status:  DNR Goals of care: Advanced Directive information    10/23/2022   10:27 AM  Advanced Directives  Does Patient Have a Medical Advance Directive? Yes  Type of Paramedic of [place];Living will  Does patient want to make changes to medical advance directive? No - Patient declined  Copy of [place] in Chart? Yes - validated most recent copy scanned in chart (See row information)     Chief Complaint  Patient presents with   Acute Visit    Patient is being seen for rapid heart rate   Error    HPI:  Pt is a 89 y.o. 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   History of B-cell lymphoma 11/02/2015   History of falling    [place]   Hypothyroidism, unspecified    per [facility]   Lymphedema, not elsewhere classified    Per [facility]   Migraines    Muscle weakness (generalized)    [place]   Neuralgia and neuritis, unspecified    [place]   Nontraumatic  subarachnoid hemorrhage, unspecified ([place])    per [facility]   Osteoporosis    Pain in right shoulder    Tachycardia, unspecified    [place]   Thyroid disease    Trigeminal neuralgia of left side of face    Unsteadiness on feet    [facility]   Past Surgical History:  Procedure Laterality Date   gamma knife     for trigeminal neuralgia   SHOULDER SURGERY     Right shoulder replacement   TONSILLECTOMY AND ADENOIDECTOMY     WISDOM TOOTH EXTRACTION      Allergies  Allergen Reactions   Penicillins Hives   Tegretol [Carbamazepine] Hives and Other (See Comments)    Turned purple from neck down and drowsy    Lyrica [Pregabalin] Other (See Comments)    Drowsy    Outpatient Encounter Medications as of 10/23/2022  Medication Sig   acetaminophen (TYLENOL) 500 MG tablet Take 500 mg by mouth every 6 (six) hours as needed. Give 1 tablet by mouth three times a day for pain   Amino Acids-Protein Hydrolys (PRO-STAT) LIQD Take 30 mLs by mouth daily.   aspirin 81 MG chewable tablet Chew 81 mg by mouth daily.   Emollient (AQUAPHOR OINTMENT BODY EX) Apply topically. 284XL Give 1 application by mouth at bedtime for dry skin  gabapentin (NEURONTIN) 600 MG tablet Take 600 mg by mouth 3 (three) times daily.   lactose free nutrition (BOOST) LIQD Take 237 mLs by mouth daily.   levothyroxine (SYNTHROID) 75 MCG tablet Take 75 mcg by mouth daily before breakfast.   loperamide (IMODIUM A-D) 2 MG capsule Take 4 mg by mouth every 8 (eight) hours as needed for diarrhea or loose stools.   methocarbamol (ROBAXIN) 500 MG tablet Take 0.5 tablets (250 mg total) by mouth 3 (three) times daily as needed for muscle spasms.   Multiple Vitamin (MULTI-VITAMINS) TABS Take 1 tablet by mouth daily.   pantoprazole (PROTONIX) 40 MG tablet Take 40 mg by mouth 2 (two) times daily.   polyethylene glycol powder (MIRALAX) 17 GM/SCOOP powder Give 17 gram by mouth as needed for Bowel Management   polyvinyl alcohol (LIQUIFILM TEARS) 1.4 %  ophthalmic solution Place 1 drop into both eyes 4 (four) times daily as needed.   simethicone (MYLICON) 414 MG chewable tablet Chew 125 mg by mouth 2 (two) times daily as needed for flatulence.   traMADol (ULTRAM) 50 MG tablet Take 25 mg by mouth 3 (three) times daily as needed.   vitamin B-12 (CYANOCOBALAMIN) 1000 MCG tablet Take 1 tablet (1,000 mcg total) by mouth daily.   zinc oxide 20 % ointment Apply 1 application. topically as needed for irritation. To buttocks after every incontinent episode and as needed for redness. May keep at bedside.   Facility-Administered Encounter Medications as of 10/23/2022  Medication   epoetin alfa-epbx (RETACRIT) injection 40,000 Units    Review of Systems  Immunization History  Administered Date(s) Administered   Fluad Quad(high Dose 65+) 05/28/2019, 07/25/2022   Influenza Split 07/12/2016, 08/13/2017, 06/02/2019, 09/09/2020   Influenza, High Dose Seasonal PF 06/11/2017, 06/28/2018   Influenza-Unspecified 06/21/2020, 07/25/2021   Moderna SARS-COV2 Booster Vaccination 03/08/2021   Moderna Sars-Covid-2 Vaccination 10/05/2019, 11/02/2019, 08/15/2020   PFIZER(Purple Top)SARS-COV-2 Vaccination 06/21/2021   Pfizer Covid-19 Vaccine Bivalent Booster 41yr & up 08/07/2022   Pneumococcal Conjugate-13 11/22/2014   Pneumococcal Polysaccharide-23 04/29/2009, 08/13/2017   Td 01/24/2013   Td (Adult) 01/24/2013   Tdap 12/28/2017, 07/01/2018, 12/13/2020   Zoster Recombinat (Shingrix) 07/01/2018   Zoster, Live 11/12/2009, 07/01/2018   Zoster, Unspecified 12/17/2005   Pertinent  Health Maintenance Due  Topic Date Due   INFLUENZA VACCINE  Completed   DEXA SCAN  Completed      09/21/2022    2:18 PM 09/28/2022    4:34 PM 10/17/2022   10:45 AM 10/22/2022    1:43 PM 10/23/2022   10:27 AM  Fall Risk  Falls in the past year?   0 0 1  Was there an injury with Fall?   0 0 0  Fall Risk Category Calculator   0 0 2  (RETIRED) Patient Fall Risk Level High fall risk  High fall risk     Patient at Risk for Falls Due to   History of fall(s);Impaired balance/gait History of fall(s);Impaired balance/gait History of fall(s);Impaired balance/gait  Fall risk Follow up   Falls evaluation completed Falls evaluation completed Falls evaluation completed   Functional Status Survey:    There were no vitals filed for this visit. There is no height or weight on file to calculate BMI. Physical Exam  Labs reviewed: Recent Labs    09/21/22 1358 09/28/22 1700 10/04/22 0000 10/12/22 1200 10/16/22 1200  NA 139 132*  --  135* 136  K 5.0 5.2*  --  5.1 4.3  CL 107 106  --  104 109  CO2 19* 17*  --  19 20*  GLUCOSE 105* 118*  --   --  208*  BUN 73* 68*  --  80* 76*  CREATININE 3.33* 3.13* 3.3* 3.3* 2.93*  CALCIUM 8.6* 7.9*  --  8.2* 7.4*   Recent Labs    09/21/22 1358 09/28/22 1700 10/12/22 1200 10/16/22 1200  AST 10* 145* 10* 14*  ALT 7 62* 7 8  ALKPHOS 75 84 111 96  BILITOT 0.3 0.3  --  0.2*  PROT 5.9* 5.6*  --  5.8*  ALBUMIN 2.5* 1.6* 2.1* <1.5*   Recent Labs    09/21/22 1358 09/28/22 1700 10/12/22 1200 10/16/22 1200  WBC 3.2* 3.1* 11.3 8.0  NEUTROABS 1.4* 2.0 5,639.00 4.3  HGB 10.1* 11.4* 10.2* 9.2*  HCT 31.7* 35.7* 31* 30.1*  MCV 102.3* 103.2*  --  104.5*  PLT 127* 175 317 287   Lab Results  Component Value Date   TSH 0.02 (A) 10/04/2022   No results found for: "HGBA1C" No results found for: "CHOL", "HDL", "LDLCALC", "LDLDIRECT", "TRIG", "CHOLHDL"  Significant Diagnostic Results in last 30 days:  CT Head Wo Contrast  Result Date: 10/16/2022 CLINICAL DATA:  Syncopal episode. EXAM: CT HEAD WITHOUT CONTRAST TECHNIQUE: Contiguous axial images were obtained from the base of the skull through the vertex without intravenous contrast. RADIATION DOSE REDUCTION: This exam was performed according to the departmental dose-optimization program which includes automated exposure control, adjustment of the mA and/or kV according to patient size and/or  use of iterative reconstruction technique. COMPARISON:  Head CT dated 09/28/2022. FINDINGS: Brain: Generalized age related parenchymal volume loss with commensurate dilatation of the ventricles and sulci. Mild chronic small vessel ischemic changes within the bilateral periventricular and subcortical white matter regions. No mass, hemorrhage, edema or other evidence of acute parenchymal abnormality. No extra-axial hemorrhage. Vascular: Chronic calcified atherosclerotic changes of the large vessels at the skull base. No unexpected hyperdense vessel. Skull: Normal. Negative for fracture or focal lesion. Sinuses/Orbits: No acute finding. Other: None. IMPRESSION: 1. No acute findings. No intracranial mass, hemorrhage or edema. 2. Mild chronic small vessel ischemic changes in the white matter. Electronically Signed   By: Franki Cabot M.D.   On: 10/16/2022 13:55   DG Chest 2 View  Result Date: 10/16/2022 CLINICAL DATA:  Syncope. EXAM: CHEST - 2 VIEW COMPARISON:  CT chest dated October 19, 2020. Chest x-ray dated August 24, 2020. FINDINGS: The heart size and mediastinal contours are within normal limits. Normal pulmonary vascularity. No focal consolidation, pleural effusion, or pneumothorax. No acute osseous abnormality. Old bilateral rib and sternal fractures again noted. IMPRESSION: No active cardiopulmonary disease. Electronically Signed   By: Titus Dubin M.D.   On: 10/16/2022 12:19   DG Humerus Right  Result Date: 09/28/2022 CLINICAL DATA:  fall, obvious deformity EXAM: RIGHT ELBOW - COMPLETE 3+ VIEW; RIGHT HUMERUS - 2+ VIEW COMPARISON:  X-ray right shoulder 09/03/2016 FINDINGS: Right humerus: Acute comminuted and displaced spiral fracture of the distal humeral shaft distal to a total right shoulder arthroplasty. No radiographic findings suggest surgical hardware complication. No periprosthetic lucency. Right elbow: No evidence of fracture, dislocation, or joint effusion. No evidence of severe  arthropathy. No aggressive appearing focal bone abnormality. Soft tissues are unremarkable. IMPRESSION: 1. Acute comminuted and displaced spiral fracture of the distal humeral shaft distal to a total right shoulder arthroplasty. 2. No acute displaced fracture or dislocation of the bones of the right elbow. Electronically Signed   By: Thomasena Edis  Mckinley Jewel M.D.   On: 09/28/2022 17:50   DG Elbow Complete Right  Result Date: 09/28/2022 CLINICAL DATA:  fall, obvious deformity EXAM: RIGHT ELBOW - COMPLETE 3+ VIEW; RIGHT HUMERUS - 2+ VIEW COMPARISON:  X-ray right shoulder 09/03/2016 FINDINGS: Right humerus: Acute comminuted and displaced spiral fracture of the distal humeral shaft distal to a total right shoulder arthroplasty. No radiographic findings suggest surgical hardware complication. No periprosthetic lucency. Right elbow: No evidence of fracture, dislocation, or joint effusion. No evidence of severe arthropathy. No aggressive appearing focal bone abnormality. Soft tissues are unremarkable. IMPRESSION: 1. Acute comminuted and displaced spiral fracture of the distal humeral shaft distal to a total right shoulder arthroplasty. 2. No acute displaced fracture or dislocation of the bones of the right elbow. Electronically Signed   By: Iven Finn M.D.   On: 09/28/2022 17:50   CT Head Wo Contrast  Result Date: 09/28/2022 CLINICAL DATA:  Head trauma, moderate-severe; Neck trauma (Age >= 65y) EXAM: CT HEAD WITHOUT CONTRAST CT CERVICAL SPINE WITHOUT CONTRAST TECHNIQUE: Multidetector CT imaging of the head and cervical spine was performed following the standard protocol without intravenous contrast. Multiplanar CT image reconstructions of the cervical spine were also generated. RADIATION DOSE REDUCTION: This exam was performed according to the departmental dose-optimization program which includes automated exposure control, adjustment of the mA and/or kV according to patient size and/or use of iterative  reconstruction technique. COMPARISON:  None Available. FINDINGS: CT HEAD FINDINGS Brain: Cerebral ventricle sizes are concordant with the degree of cerebral volume loss. Patchy and confluent areas of decreased attenuation are noted throughout the deep and periventricular white matter of the cerebral hemispheres bilaterally, compatible with chronic microvascular ischemic disease. No evidence of large-territorial acute infarction. No parenchymal hemorrhage. No mass lesion. No extra-axial collection. No mass effect or midline shift. No hydrocephalus. Basilar cisterns are patent. Vascular: No hyperdense vessel. Skull: No acute fracture or focal lesion. Sinuses/Orbits: Paranasal sinuses and mastoid air cells are clear. Bilateral lens replacement. Otherwise the orbits are unremarkable. Other: 3 mm right frontal scalp hematoma formation. CT CERVICAL SPINE FINDINGS Alignment: Reversal normal cervical lordosis at the C6 level likely due to positioning and degenerative changes. Grade 1 anterolisthesis C2 on C3, C3 on C4, C4 on C5, C5 on C6. Mild retrolisthesis of C6 on C7. Skull base and vertebrae: Multilevel severe degenerative changes most prominent at the C6-C7 level. Associated multilevel moderate to severe osseous neural foraminal stenosis. No associated severe osseous central canal stenosis. No acute fracture. No aggressive appearing focal osseous lesion or focal pathologic process. Soft tissues and spinal canal: No prevertebral fluid or swelling. No visible canal hematoma. Upper chest: Biapical pleural/pulmonary scarring. Other: Atherosclerotic plaque of the carotid arteries within the neck. IMPRESSION: 1. No acute intracranial abnormality. 2. No acute displaced fracture or traumatic listhesis of the cervical spine. Electronically Signed   By: Iven Finn M.D.   On: 09/28/2022 17:42   CT Cervical Spine Wo Contrast  Result Date: 09/28/2022 CLINICAL DATA:  Head trauma, moderate-severe; Neck trauma (Age >= 65y)  EXAM: CT HEAD WITHOUT CONTRAST CT CERVICAL SPINE WITHOUT CONTRAST TECHNIQUE: Multidetector CT imaging of the head and cervical spine was performed following the standard protocol without intravenous contrast. Multiplanar CT image reconstructions of the cervical spine were also generated. RADIATION DOSE REDUCTION: This exam was performed according to the departmental dose-optimization program which includes automated exposure control, adjustment of the mA and/or kV according to patient size and/or use of iterative reconstruction technique. COMPARISON:  None Available. FINDINGS: CT  HEAD FINDINGS Brain: Cerebral ventricle sizes are concordant with the degree of cerebral volume loss. Patchy and confluent areas of decreased attenuation are noted throughout the deep and periventricular white matter of the cerebral hemispheres bilaterally, compatible with chronic microvascular ischemic disease. No evidence of large-territorial acute infarction. No parenchymal hemorrhage. No mass lesion. No extra-axial collection. No mass effect or midline shift. No hydrocephalus. Basilar cisterns are patent. Vascular: No hyperdense vessel. Skull: No acute fracture or focal lesion. Sinuses/Orbits: Paranasal sinuses and mastoid air cells are clear. Bilateral lens replacement. Otherwise the orbits are unremarkable. Other: 3 mm right frontal scalp hematoma formation. CT CERVICAL SPINE FINDINGS Alignment: Reversal normal cervical lordosis at the C6 level likely due to positioning and degenerative changes. Grade 1 anterolisthesis C2 on C3, C3 on C4, C4 on C5, C5 on C6. Mild retrolisthesis of C6 on C7. Skull base and vertebrae: Multilevel severe degenerative changes most prominent at the C6-C7 level. Associated multilevel moderate to severe osseous neural foraminal stenosis. No associated severe osseous central canal stenosis. No acute fracture. No aggressive appearing focal osseous lesion or focal pathologic process. Soft tissues and spinal  canal: No prevertebral fluid or swelling. No visible canal hematoma. Upper chest: Biapical pleural/pulmonary scarring. Other: Atherosclerotic plaque of the carotid arteries within the neck. IMPRESSION: 1. No acute intracranial abnormality. 2. No acute displaced fracture or traumatic listhesis of the cervical spine. Electronically Signed   By: Iven Finn M.D.   On: 09/28/2022 17:42    Assessment/Plan There are no diagnoses linked to this encounter.   Family/ staff Communication: ***  Labs/tests ordered:  *** This encounter was created in error - please disregard.

## 2022-10-24 ENCOUNTER — Encounter (HOSPITAL_COMMUNITY): Payer: Self-pay

## 2022-10-24 ENCOUNTER — Other Ambulatory Visit: Payer: Self-pay

## 2022-10-24 ENCOUNTER — Inpatient Hospital Stay: Payer: Medicare Other

## 2022-10-24 ENCOUNTER — Encounter (HOSPITAL_BASED_OUTPATIENT_CLINIC_OR_DEPARTMENT_OTHER): Payer: Self-pay

## 2022-10-24 ENCOUNTER — Inpatient Hospital Stay (HOSPITAL_BASED_OUTPATIENT_CLINIC_OR_DEPARTMENT_OTHER)
Admission: EM | Admit: 2022-10-24 | Discharge: 2022-10-26 | DRG: 641 | Disposition: A | Discharge status: Skilled Nursing Facility | Payer: Medicare Other | Attending: Internal Medicine | Admitting: Internal Medicine

## 2022-10-24 ENCOUNTER — Emergency Department (HOSPITAL_BASED_OUTPATIENT_CLINIC_OR_DEPARTMENT_OTHER): Payer: Medicare Other

## 2022-10-24 ENCOUNTER — Inpatient Hospital Stay (HOSPITAL_BASED_OUTPATIENT_CLINIC_OR_DEPARTMENT_OTHER): Payer: Medicare Other | Admitting: Oncology

## 2022-10-24 ENCOUNTER — Telehealth: Payer: Self-pay | Admitting: *Deleted

## 2022-10-24 ENCOUNTER — Encounter: Payer: Self-pay | Admitting: Oncology

## 2022-10-24 ENCOUNTER — Inpatient Hospital Stay: Payer: Medicare Other | Attending: Hematology & Oncology

## 2022-10-24 VITALS — BP 73/48 | HR 130 | Resp 19 | Ht 66.0 in

## 2022-10-24 DIAGNOSIS — R002 Palpitations: Secondary | ICD-10-CM | POA: Diagnosis present

## 2022-10-24 DIAGNOSIS — Z87891 Personal history of nicotine dependence: Secondary | ICD-10-CM

## 2022-10-24 DIAGNOSIS — E861 Hypovolemia: Secondary | ICD-10-CM | POA: Diagnosis present

## 2022-10-24 DIAGNOSIS — E440 Moderate protein-calorie malnutrition: Secondary | ICD-10-CM | POA: Diagnosis present

## 2022-10-24 DIAGNOSIS — Z880 Allergy status to penicillin: Secondary | ICD-10-CM

## 2022-10-24 DIAGNOSIS — I959 Hypotension, unspecified: Secondary | ICD-10-CM | POA: Diagnosis present

## 2022-10-24 DIAGNOSIS — I129 Hypertensive chronic kidney disease with stage 1 through stage 4 chronic kidney disease, or unspecified chronic kidney disease: Secondary | ICD-10-CM | POA: Diagnosis present

## 2022-10-24 DIAGNOSIS — Z809 Family history of malignant neoplasm, unspecified: Secondary | ICD-10-CM | POA: Diagnosis not present

## 2022-10-24 DIAGNOSIS — D630 Anemia in neoplastic disease: Secondary | ICD-10-CM | POA: Diagnosis present

## 2022-10-24 DIAGNOSIS — C8588 Other specified types of non-Hodgkin lymphoma, lymph nodes of multiple sites: Secondary | ICD-10-CM | POA: Diagnosis not present

## 2022-10-24 DIAGNOSIS — Z1152 Encounter for screening for COVID-19: Secondary | ICD-10-CM

## 2022-10-24 DIAGNOSIS — Z8572 Personal history of non-Hodgkin lymphomas: Secondary | ICD-10-CM

## 2022-10-24 DIAGNOSIS — E860 Dehydration: Secondary | ICD-10-CM

## 2022-10-24 DIAGNOSIS — N179 Acute kidney failure, unspecified: Secondary | ICD-10-CM | POA: Diagnosis present

## 2022-10-24 DIAGNOSIS — Z681 Body mass index (BMI) 19 or less, adult: Secondary | ICD-10-CM

## 2022-10-24 DIAGNOSIS — N184 Chronic kidney disease, stage 4 (severe): Secondary | ICD-10-CM | POA: Diagnosis present

## 2022-10-24 DIAGNOSIS — Z66 Do not resuscitate: Secondary | ICD-10-CM | POA: Diagnosis present

## 2022-10-24 DIAGNOSIS — R54 Age-related physical debility: Secondary | ICD-10-CM | POA: Diagnosis present

## 2022-10-24 DIAGNOSIS — E039 Hypothyroidism, unspecified: Secondary | ICD-10-CM | POA: Diagnosis present

## 2022-10-24 DIAGNOSIS — Z8249 Family history of ischemic heart disease and other diseases of the circulatory system: Secondary | ICD-10-CM

## 2022-10-24 DIAGNOSIS — M810 Age-related osteoporosis without current pathological fracture: Secondary | ICD-10-CM | POA: Diagnosis present

## 2022-10-24 DIAGNOSIS — Z79890 Hormone replacement therapy (postmenopausal): Secondary | ICD-10-CM

## 2022-10-24 DIAGNOSIS — D539 Nutritional anemia, unspecified: Secondary | ICD-10-CM | POA: Diagnosis present

## 2022-10-24 DIAGNOSIS — Z9181 History of falling: Secondary | ICD-10-CM

## 2022-10-24 DIAGNOSIS — I9589 Other hypotension: Secondary | ICD-10-CM | POA: Diagnosis present

## 2022-10-24 DIAGNOSIS — K219 Gastro-esophageal reflux disease without esophagitis: Secondary | ICD-10-CM | POA: Diagnosis present

## 2022-10-24 DIAGNOSIS — Z79899 Other long term (current) drug therapy: Secondary | ICD-10-CM

## 2022-10-24 DIAGNOSIS — Z9221 Personal history of antineoplastic chemotherapy: Secondary | ICD-10-CM

## 2022-10-24 DIAGNOSIS — R627 Adult failure to thrive: Secondary | ICD-10-CM | POA: Diagnosis present

## 2022-10-24 DIAGNOSIS — Z823 Family history of stroke: Secondary | ICD-10-CM | POA: Diagnosis not present

## 2022-10-24 DIAGNOSIS — Z7982 Long term (current) use of aspirin: Secondary | ICD-10-CM

## 2022-10-24 DIAGNOSIS — Z96611 Presence of right artificial shoulder joint: Secondary | ICD-10-CM | POA: Diagnosis present

## 2022-10-24 DIAGNOSIS — Z888 Allergy status to other drugs, medicaments and biological substances status: Secondary | ICD-10-CM | POA: Diagnosis not present

## 2022-10-24 LAB — CBC WITH DIFFERENTIAL/PLATELET
Abs Immature Granulocytes: 0.09 10*3/uL — ABNORMAL HIGH (ref 0.00–0.07)
Basophils Absolute: 0 10*3/uL (ref 0.0–0.1)
Basophils Relative: 1 %
Eosinophils Absolute: 0.3 10*3/uL (ref 0.0–0.5)
Eosinophils Relative: 4 %
HCT: 29.7 % — ABNORMAL LOW (ref 36.0–46.0)
Hemoglobin: 9.5 g/dL — ABNORMAL LOW (ref 12.0–15.0)
Immature Granulocytes: 1 %
Lymphocytes Relative: 46 %
Lymphs Abs: 4.1 10*3/uL — ABNORMAL HIGH (ref 0.7–4.0)
MCH: 31.8 pg (ref 26.0–34.0)
MCHC: 32 g/dL (ref 30.0–36.0)
MCV: 99.3 fL (ref 80.0–100.0)
Monocytes Absolute: 1.5 10*3/uL — ABNORMAL HIGH (ref 0.1–1.0)
Monocytes Relative: 18 %
Neutro Abs: 2.5 10*3/uL (ref 1.7–7.7)
Neutrophils Relative %: 30 %
Platelets: 286 10*3/uL (ref 150–400)
RBC: 2.99 MIL/uL — ABNORMAL LOW (ref 3.87–5.11)
RDW: 17.7 % — ABNORMAL HIGH (ref 11.5–15.5)
Smear Review: NORMAL
WBC: 8.5 10*3/uL (ref 4.0–10.5)
nRBC: 0 % (ref 0.0–0.2)

## 2022-10-24 LAB — PROTIME-INR
INR: 1 (ref 0.8–1.2)
Prothrombin Time: 13 seconds (ref 11.4–15.2)

## 2022-10-24 LAB — CBC WITH DIFFERENTIAL (CANCER CENTER ONLY)
Abs Immature Granulocytes: 0 10*3/uL (ref 0.00–0.07)
Basophils Absolute: 0 10*3/uL (ref 0.0–0.1)
Basophils Relative: 0 %
Eosinophils Absolute: 0.3 10*3/uL (ref 0.0–0.5)
Eosinophils Relative: 3 %
HCT: 32.9 % — ABNORMAL LOW (ref 36.0–46.0)
Hemoglobin: 10.4 g/dL — ABNORMAL LOW (ref 12.0–15.0)
Lymphocytes Relative: 62 %
Lymphs Abs: 5.6 10*3/uL — ABNORMAL HIGH (ref 0.7–4.0)
MCH: 32.2 pg (ref 26.0–34.0)
MCHC: 31.6 g/dL (ref 30.0–36.0)
MCV: 101.9 fL — ABNORMAL HIGH (ref 80.0–100.0)
Monocytes Absolute: 0.8 10*3/uL (ref 0.1–1.0)
Monocytes Relative: 9 %
Neutro Abs: 2.4 10*3/uL (ref 1.7–7.7)
Neutrophils Relative %: 26 %
Platelet Count: 286 10*3/uL (ref 150–400)
RBC: 3.23 MIL/uL — ABNORMAL LOW (ref 3.87–5.11)
RDW: 17.5 % — ABNORMAL HIGH (ref 11.5–15.5)
Smear Review: NORMAL
WBC Count: 9.1 10*3/uL (ref 4.0–10.5)
nRBC: 0 % (ref 0.0–0.2)

## 2022-10-24 LAB — COMPREHENSIVE METABOLIC PANEL
ALT: 9 U/L (ref 0–44)
AST: 17 U/L (ref 15–41)
Albumin: 1.6 g/dL — ABNORMAL LOW (ref 3.5–5.0)
Alkaline Phosphatase: 109 U/L (ref 38–126)
Anion gap: 10 (ref 5–15)
BUN: 71 mg/dL — ABNORMAL HIGH (ref 8–23)
CO2: 18 mmol/L — ABNORMAL LOW (ref 22–32)
Calcium: 7.9 mg/dL — ABNORMAL LOW (ref 8.9–10.3)
Chloride: 108 mmol/L (ref 98–111)
Creatinine, Ser: 2.99 mg/dL — ABNORMAL HIGH (ref 0.44–1.00)
GFR, Estimated: 14 mL/min — ABNORMAL LOW (ref >60)
Glucose, Bld: 118 mg/dL — ABNORMAL HIGH (ref 70–99)
Potassium: 4.1 mmol/L (ref 3.5–5.1)
Sodium: 136 mmol/L (ref 135–145)
Total Bilirubin: 0.4 mg/dL (ref 0.3–1.2)
Total Protein: 6.6 g/dL (ref 6.5–8.1)

## 2022-10-24 LAB — CMP (CANCER CENTER ONLY)
ALT: 6 U/L (ref 0–44)
AST: 12 U/L — ABNORMAL LOW (ref 15–41)
Albumin: 2.4 g/dL — ABNORMAL LOW (ref 3.5–5.0)
Alkaline Phosphatase: 114 U/L (ref 38–126)
Anion gap: 14 (ref 5–15)
BUN: 71 mg/dL — ABNORMAL HIGH (ref 8–23)
CO2: 19 mmol/L — ABNORMAL LOW (ref 22–32)
Calcium: 9.2 mg/dL (ref 8.9–10.3)
Chloride: 105 mmol/L (ref 98–111)
Creatinine: 3.17 mg/dL (ref 0.44–1.00)
GFR, Estimated: 13 mL/min — ABNORMAL LOW (ref >60)
Glucose, Bld: 118 mg/dL — ABNORMAL HIGH (ref 70–99)
Potassium: 4.5 mmol/L (ref 3.5–5.1)
Sodium: 138 mmol/L (ref 135–145)
Total Bilirubin: 0.2 mg/dL — ABNORMAL LOW (ref 0.3–1.2)
Total Protein: 6.8 g/dL (ref 6.5–8.1)

## 2022-10-24 LAB — RESP PANEL BY RT-PCR (RSV, FLU A&B, COVID)  RVPGX2
Influenza A by PCR: NEGATIVE
Influenza B by PCR: NEGATIVE
Resp Syncytial Virus by PCR: NEGATIVE
SARS Coronavirus 2 by RT PCR: NEGATIVE

## 2022-10-24 LAB — URINALYSIS, ROUTINE W REFLEX MICROSCOPIC
Bilirubin Urine: NEGATIVE
Glucose, UA: 100 mg/dL — AB
Ketones, ur: NEGATIVE mg/dL
Leukocytes,Ua: NEGATIVE
Nitrite: NEGATIVE
Protein, ur: >=300 mg/dL — AB
Specific Gravity, Urine: 1.02 (ref 1.005–1.030)
pH: 5.5 (ref 5.0–8.0)

## 2022-10-24 LAB — SAVE SMEAR(SSMR), FOR PROVIDER SLIDE REVIEW

## 2022-10-24 LAB — URINALYSIS, MICROSCOPIC (REFLEX)

## 2022-10-24 LAB — LACTIC ACID, PLASMA: Lactic Acid, Venous: 1.6 mmol/L (ref 0.5–1.9)

## 2022-10-24 LAB — LACTATE DEHYDROGENASE: LDH: 277 U/L — ABNORMAL HIGH (ref 98–192)

## 2022-10-24 LAB — TROPONIN I (HIGH SENSITIVITY)
Troponin I (High Sensitivity): 41 ng/L — ABNORMAL HIGH (ref <18)
Troponin I (High Sensitivity): 29 ng/L — ABNORMAL HIGH (ref <18)

## 2022-10-24 LAB — APTT: aPTT: 27 seconds (ref 24–36)

## 2022-10-24 MED ORDER — LACTATED RINGERS IV BOLUS (SEPSIS)
1000.0000 mL | Freq: Once | INTRAVENOUS | Status: AC
  Administered 2022-10-24: 1000 mL via INTRAVENOUS

## 2022-10-24 NOTE — ED Notes (Signed)
Called [facility] for transport at 9:58.  

## 2022-10-24 NOTE — ED Notes (Signed)
Client states she has not felt well for the past few days, "I feel like I am dehydrated", left radial pulse easily palpated, skin tenting is noted, capillary refill WNL. Pt is alert and oriented.

## 2022-10-24 NOTE — ED Triage Notes (Addendum)
Pt brought in by nephew.  Sent by Dr Jarrett Ables due to low Bp , elevated kidney function weakness and poor PO intake since fall 10 days ago. Pt sustained right fractured elbow.

## 2022-10-24 NOTE — ED Provider Notes (Signed)
EMERGENCY DEPARTMENT AT [facility] Provider Note   CSN: [id number] Arrival date & time: 10/24/22  1514     History  Chief Complaint  Patient presents with   Weakness   Hypotension    Kenia Teagarden is a 89 y.o. female.  89 yo F with a chief complaints of fatigue.  This has been going on for a little bit over a week.  The patient had suffered a fall and at that time was evaluated in the emergency department setting.  Since then she has not done well at home.  She went to see her oncologist today and was noted to be hypotensive and was sent here for evaluation.  She tells me that she feels well otherwise.  She denies any chest pain denies shortness of breath denies cough congestion denies fevers or chills denies urinary symptoms denies abdominal pain.  She does have some pain in the elbow and tells me that she thinks she broke it.  She denies headaches denies confusion.  She thinks that maybe she is dehydrated.   Weakness      Home Medications Prior to Admission medications   Medication Sig Start Date End Date Taking? Authorizing Provider  acetaminophen (TYLENOL) 500 MG tablet Take 500 mg by mouth every 6 (six) hours as needed. Give 1 tablet by mouth three times a day for pain    [provider]  Amino Acids-Protein Hydrolys (PRO-STAT) LIQD Take 30 mLs by mouth daily.    [provider]  aspirin 81 MG chewable tablet Chew 81 mg by mouth daily.    [provider]  Emollient (AQUAPHOR OINTMENT BODY EX) Apply topically. 765YY Give 1 application by mouth at bedtime for dry skin    [provider]  gabapentin (NEURONTIN) 600 MG tablet Take 600 mg by mouth 3 (three) times daily. 06/13/20   [provider]  lactose free nutrition (BOOST) LIQD Take 237 mLs by mouth daily.    [provider]  levothyroxine (SYNTHROID) 75 MCG tablet Take 75 mcg by mouth daily before breakfast.    [provider]  loperamide  (IMODIUM A-D) 2 MG capsule Take 4 mg by mouth every 8 (eight) hours as needed for diarrhea or loose stools.    [provider]  methocarbamol (ROBAXIN) 500 MG tablet Take 0.5 tablets (250 mg total) by mouth 3 (three) times daily as needed for muscle spasms. 07/05/22   Fargo, Amy E, NP  Multiple Vitamin (MULTI-VITAMINS) TABS Take 1 tablet by mouth daily.    [provider]  pantoprazole (PROTONIX) 40 MG tablet Take 40 mg by mouth 2 (two) times daily.    [provider]  polyethylene glycol powder (MIRALAX) 17 GM/SCOOP powder Give 17 gram by mouth as needed for Bowel Management    [provider]  polyvinyl alcohol (LIQUIFILM TEARS) 1.4 % ophthalmic solution Place 1 drop into both eyes 4 (four) times daily as needed.    [provider]  simethicone (MYLICON) 503 MG chewable tablet Chew 125 mg by mouth 2 (two) times daily as needed for flatulence.    [provider]  traMADol (ULTRAM) 50 MG tablet Take 25 mg by mouth 3 (three) times daily as needed.    [provider]  vitamin B-12 (CYANOCOBALAMIN) 1000 MCG tablet Take 1 tablet (1,000 mcg total) by mouth daily. 05/19/21   Fargo, Amy E, NP  zinc oxide 20 % ointment Apply 1 application. topically as needed for irritation. To buttocks  after every incontinent episode and as needed for redness. May keep at bedside.    [provider]      Allergies    Penicillins, Tegretol [carbamazepine], and Lyrica [pregabalin]    Review of Systems   Review of Systems  Neurological:  Positive for weakness.    Physical Exam Updated Vital Signs BP (!) 141/88 (BP Location: Left Arm)   Pulse (!) 113   Temp 98.7 F (37.1 C) (Oral)   Resp (!) 22   Wt 43.1 kg   SpO2 92%   BMI 15.33 kg/m  Physical Exam Vitals and nursing note reviewed.  Constitutional:      General: She is not in acute distress.    Appearance: She is well-developed. She is not diaphoretic.  HENT:     Head: Normocephalic.      Comments: Old appearing hematoma to the right temporal region Eyes:     Pupils: Pupils are equal, round, and reactive to light.  Cardiovascular:     Rate and Rhythm: Normal rate and regular rhythm.     Heart sounds: No murmur heard.    No friction rub. No gallop.  Pulmonary:     Effort: Pulmonary effort is normal.     Breath sounds: No wheezing or rales.  Abdominal:     General: There is no distension.     Palpations: Abdomen is soft.     Tenderness: There is no abdominal tenderness.  Musculoskeletal:        General: No tenderness.     Cervical back: Normal range of motion and neck supple.     Comments: Pain about the right elbow.  Skin:    General: Skin is warm and dry.  Neurological:     Mental Status: She is alert and oriented to person, place, and time.  Psychiatric:        Behavior: Behavior normal.     ED Results / Procedures / Treatments   Labs (all labs ordered are listed, but only abnormal results are displayed) Labs Reviewed  COMPREHENSIVE METABOLIC PANEL - Abnormal; Notable for the following components:      Result Value   CO2 18 (*)    Glucose, Bld 118 (*)    BUN 71 (*)    Creatinine, Ser 2.99 (*)    Calcium 7.9 (*)    Albumin 1.6 (*)    GFR, Estimated 14 (*)    All other components within normal limits  CBC WITH DIFFERENTIAL/PLATELET - Abnormal; Notable for the following components:   RBC 2.99 (*)    Hemoglobin 9.5 (*)    HCT 29.7 (*)    RDW 17.7 (*)    Lymphs Abs 4.1 (*)    Monocytes Absolute 1.5 (*)    Abs Immature Granulocytes 0.09 (*)    All other components within normal limits  URINALYSIS, ROUTINE W REFLEX MICROSCOPIC - Abnormal; Notable for the following components:   Glucose, UA 100 (*)    Hgb urine dipstick TRACE (*)    Protein, ur >=300 (*)    All other components within normal limits  URINALYSIS, MICROSCOPIC (REFLEX) - Abnormal; Notable for the following components:   Bacteria, UA RARE (*)    All other components within normal limits   TROPONIN I (HIGH SENSITIVITY) - Abnormal; Notable for the following components:   Troponin I (High Sensitivity) 41 (*)    All other components within normal limits  TROPONIN I (HIGH SENSITIVITY) - Abnormal; Notable for the following components:   Troponin  I (High Sensitivity) 29 (*)    All other components within normal limits  RESP PANEL BY RT-PCR (RSV, FLU A&B, COVID)  RVPGX2  CULTURE, BLOOD (ROUTINE X 2)  CULTURE, BLOOD (ROUTINE X 2)  LACTIC ACID, PLASMA  PROTIME-INR  APTT  LACTIC ACID, PLASMA  URINALYSIS, W/ REFLEX TO CULTURE (INFECTION SUSPECTED)    EKG EKG Interpretation  Date/Time:  Wednesday October 24 2022 15:31:11 EST Ventricular Rate:  121 PR Interval:  147 QRS Duration: 80 QT Interval:  333 QTC Calculation: 473 R Axis:   39 Text Interpretation: Sinus tachycardia Nonspecific T abnormalities, lateral leads No significant change since last tracing Confirmed by Deno Etienne [id number]) on 10/24/2022 3:46:07 PM  Radiology US Venous Img Lower Bilateral  Result Date: 10/24/2022 CLINICAL DATA:  Short of breath, evaluate for deep venous thrombosis EXAM: Bilateral LOWER EXTREMITY VENOUS DOPPLER ULTRASOUND TECHNIQUE: Gray-scale sonography with compression, as well as color and duplex ultrasound, were performed to evaluate the deep venous system(s) from the level of the common femoral vein through the popliteal and proximal calf veins. COMPARISON:  None Available. FINDINGS: VENOUS Normal compressibility of the common femoral, superficial femoral, and popliteal veins, as well as the visualized calf veins. Visualized portions of profunda femoral vein and great saphenous vein unremarkable. No filling defects to suggest DVT on grayscale or color Doppler imaging. Doppler waveforms show normal direction of venous flow, normal respiratory plasticity and response to augmentation. Limited views of the contralateral common femoral vein are unremarkable. OTHER None. Limitations: none IMPRESSION: No  evidence of deep venous thrombosis within LEFT or RIGHT lower extremity. Electronically Signed   By: Suzy Bouchard M.D.   On: 10/24/2022 18:33   DG Chest Port 1 View  Result Date: 10/24/2022 CLINICAL DATA:  Sepsis EXAM: PORTABLE CHEST 1 VIEW COMPARISON:  10/16/2022 x-ray FINDINGS: Slightly elevated right hemidiaphragm. No consolidation, pneumothorax or effusion. Normal cardiopericardial silhouette without edema. Overlapping cardiac leads. Right shoulder arthroplasty. Film is slightly rotated. Old rib fractures. IMPRESSION: No active disease.  No acute cardiopulmonary disease. Electronically Signed   By: Jill Side M.D.   On: 10/24/2022 15:59    Procedures .Critical Care  Performed by: Deno Etienne, DO Authorized by: Deno Etienne, DO   Critical care provider statement:    Critical care time (minutes):  35   Critical care time was exclusive of:  Separately billable procedures and treating other patients   Critical care was time spent personally by me on the following activities:  Development of treatment plan with patient or surrogate, discussions with consultants, evaluation of patient's response to treatment, examination of patient, ordering and review of laboratory studies, ordering and review of radiographic studies, ordering and performing treatments and interventions, pulse oximetry, re-evaluation of patient's condition and review of old charts   Care discussed with: admitting provider       Medications Ordered in ED Medications  lactated ringers bolus 1,000 mL (0 mLs Intravenous Stopped 10/24/22 2010)  lactated ringers bolus 1,000 mL (0 mLs Intravenous Stopped 10/24/22 2010)    ED Course/ Medical Decision Making/ A&P                             Medical Decision Making Amount and/or Complexity of Data Reviewed Labs: ordered. Radiology: ordered. ECG/medicine tests: ordered.  Risk Decision regarding hospitalization.   89 yo F with a chief complaints of fatigue.  This been  going on for about a week after suffering a fall.  She was seen at her oncology office and found to have a blood pressure of 76/49 and was tachycardic into the 120s and was sent here for evaluation.  Her blood pressure is improved without intervention, her heart rate remains fast in the 120s.  Will obtain a laboratory evaluation.  Bolus of IV fluids.  She did have blood work done in the oncology clinic today, no significant change to her renal function, hemoglobin appears to be above baseline.  Patient is feeling bit better.  Initial troponin was elevated and second 1 is elevated but downtrending.  Concerning possibly for pulmonary embolism, unable to do CT no chest pain no obvious difficulty breathing.  Bilateral lower extremity DVT study negative.  Will hold off on anticoagulation at this time.  Discussed with medicine for admission.  Seems unlikely to be sepsis based on history physical and workup.  The patient is noted to have a MAP's <65/ SBP's <90. With the current information available to me, I don't think the patient is in septic shock. The MAP's <65/ SBP's <90, is related to [place] .   The patients results and plan were reviewed and discussed.   Any x-rays performed were independently reviewed by myself.   Differential diagnosis were considered with the presenting HPI.  Medications  lactated ringers bolus 1,000 mL (0 mLs Intravenous Stopped 10/24/22 2010)  lactated ringers bolus 1,000 mL (0 mLs Intravenous Stopped 10/24/22 2010)    Vitals:   10/24/22 1840 10/24/22 1900 10/24/22 1920 10/24/22 2000  BP: 136/68 (!) 151/77 139/73 (!) 141/88  Pulse: (!) 110 (!) 113 (!) 113 (!) 113  Resp: '18 14 19 '$ (!) 22  Temp:    98.7 F (37.1 C)  TempSrc:    Oral  SpO2: 92% 91% 91% 92%  Weight:        Final diagnoses:  Hypotension due to hypovolemia    Admission/ observation were discussed with the admitting physician, patient and/or family and they are comfortable with the plan.           Final Clinical Impression(s) / ED Diagnoses Final diagnoses:  Hypotension due to hypovolemia    Rx / DC Orders ED Discharge Orders     None         Deno Etienne, DO 10/24/22 2116

## 2022-10-24 NOTE — Progress Notes (Signed)
Hospitalist Transfer Note:  Transferring facility: [facility] Requesting provider: Dr. Deno Etienne (EDP at [facility]) Reason for transfer: admission for further evaluation and management of mild hypotension, improving with IV fluids, dehydration, generalized weakness.   89 year old female, with B-cell lymphoma , who presented to [facility] ED complaining of generalized weakness.  She reports the had presented to the emergency department on January 16 for a single episode of syncope.  Evaluation in the emergency department at that time was reported to be benign, and the patient was discharged from the emergency department back to her SNF.  However, since returning to her SNF, the staff there noted that she was exhibiting evidence of diminished oral intake and complaining of some generalized weakness, fatigue.  Then, on 10/24/2022, she was seen at oncology clinic for routine follow-up, at which time her blood pressure was noted to be borderline low, prompting the patient be brought to [facility] emergency department for further evaluation thereof.  It is noted that she has had pancytopenia in the setting of chemotherapy for B-cell lymphoma, prompting holding of her most recent chemotherapy treatments, with EDP noting that her most recent chemotherapy  occurred sometime in December 2023.  She is otherwise without any reported acute complaint, including no report of chest pain or shortness of breath.  No interval syncopal episodes reported.  Vital signs in the ED were notable for the following: Systolic blood pressures mildly low, but improving with IV fluids; initially, heart rate mildly elevated.  Afebrile.  Labs were notable for very mild elevation in troponin.  EKG with sinus tachycardia and no evidence of acute ischemic changes.  EDP conveyed that differential  for borderline hypotension, tachycardia, and this patient with underlying malignancy including the possibility of acute pulmonary  embolism, but also noted the patient's chronic kidney disease and inability to pursue CTA chest on this basis.  Ultrasound the bilateral lower extremities was negative for acute DVT.  Per my brief chart review, it appears that  the patient. recently completed DNR paperwork as an outpatient on 10/22/2022 .  Subsequently, I accepted this patient for transfer for inpatient admission to a pcu bed at [facility] for further work-up and management of the above .     Check [email or web address] for on-call coverage.   Nursing staff, Please call [place] number on Amion as soon as patient's arrival, so appropriate admitting provider can evaluate the pt.     Babs Bertin, DO Hospitalist

## 2022-10-24 NOTE — Telephone Encounter (Signed)
Lab brought to my attention a panic Creatinine of 3.17. Results given to Mikey Bussing, NP.

## 2022-10-24 NOTE — Progress Notes (Signed)
Hematology and Oncology Follow Up Visit  Joy Lynch [id number] August 10, 1926 89 y.o. 10/24/2022   Principle Diagnosis:  Marginal Zone lymphoma -- relapsed Anemia secondary to chronic kidney disease, stage III   Past Therapy: Umbralisib 400 mg po q day, started on 10/22/2020 - discontinued by manufacturer    Current Therapy:        Calquence 100 mg PO daily, started 05/25/2021 -on hold starting 09/21/2022 Retacrit SQ as indicated for Hgb < 11    Interim History:  Joy Lynch returns for routine follow-up today with her nephew.  She is in a wheelchair today.  Since her last visit, she has fallen and broke her right arm.  She has been removed from assisted living to skilled facility.  Sounds like they are trying to do some rehab with her.  Today, she reports feeling dizziness and having fainting spells.  She also reports shortness of breath.  According to her nephew, she is not eating or drinking very well.    Records from facility reviewed in epic show that she has been hypotensive and tachycardic.  It appears that she has received IV fluids while in the facility.  While at her visit, we were unable to obtain a temperature, her pulse is 130, and blood pressure 73/48.  She has been off [place] since 09/21/2022.  This was stopped at her last visit secondary to pancytopenia.  She is not having any fevers, chills, abdominal pain, nausea, vomiting.  She reports pain to her right arm secondary to fracture.  Overall, I would have to say that her performance status is probably ECOG 3.    Medications:  Allergies as of 10/24/2022       Reactions   Penicillins Hives   Tegretol [carbamazepine] Hives, Other (See Comments)   Turned purple from neck down and drowsy   Lyrica [pregabalin] Other (See Comments)   Drowsy        Medication List        Accurate as of October 24, 2022  4:03 PM. If you have any questions, ask your nurse or doctor.          acetaminophen 500 MG tablet Commonly  known as: TYLENOL Take 500 mg by mouth every 6 (six) hours as needed. Give 1 tablet by mouth three times a day for pain   AQUAPHOR OINTMENT BODY EX Apply topically. 258NI Give 1 application by mouth at bedtime for dry skin   aspirin 81 MG chewable tablet Chew 81 mg by mouth daily.   cyanocobalamin 1000 MCG tablet Commonly known as: VITAMIN B12 Take 1 tablet (1,000 mcg total) by mouth daily.   gabapentin 600 MG tablet Commonly known as: NEURONTIN Take 600 mg by mouth 3 (three) times daily.   Imodium A-D 2 MG capsule Generic drug: loperamide Take 4 mg by mouth every 8 (eight) hours as needed for diarrhea or loose stools.   lactose free nutrition Liqd Take 237 mLs by mouth daily.   levothyroxine 75 MCG tablet Commonly known as: SYNTHROID Take 75 mcg by mouth daily before breakfast.   methocarbamol 500 MG tablet Commonly known as: ROBAXIN Take 0.5 tablets (250 mg total) by mouth 3 (three) times daily as needed for muscle spasms.   MiraLax 17 GM/SCOOP powder Generic drug: polyethylene glycol powder Give 17 gram by mouth as needed for Bowel Management   Multi-Vitamins Tabs Take 1 tablet by mouth daily.   pantoprazole 40 MG tablet Commonly known as: PROTONIX Take 40 mg by mouth 2 (two)  times daily.   polyvinyl alcohol 1.4 % ophthalmic solution Commonly known as: LIQUIFILM TEARS Place 1 drop into both eyes 4 (four) times daily as needed.   Pro-Stat Liqd Take 30 mLs by mouth daily.   simethicone 125 MG chewable tablet Commonly known as: MYLICON Chew 696 mg by mouth 2 (two) times daily as needed for flatulence.   traMADol 50 MG tablet Commonly known as: ULTRAM Take 25 mg by mouth 3 (three) times daily as needed.   zinc oxide 20 % ointment Apply 1 application. topically as needed for irritation. To buttocks after every incontinent episode and as needed for redness. May keep at bedside.        Allergies:  Allergies  Allergen Reactions   Penicillins Hives    Tegretol [Carbamazepine] Hives and Other (See Comments)    Turned purple from neck down and drowsy    Lyrica [Pregabalin] Other (See Comments)    Drowsy    Past Medical History, Surgical history, Social history, and Family History were reviewed and updated.  Review of Systems: Review of Systems  Constitutional:  Positive for malaise/fatigue.  HENT: Negative.    Eyes: Negative.   Respiratory:  Positive for shortness of breath.   Cardiovascular: Negative.   Gastrointestinal: Negative.   Genitourinary: Negative.   Musculoskeletal:  Positive for falls.  Skin: Negative.   Neurological:  Positive for dizziness.  Endo/Heme/Allergies: Negative.   Psychiatric/Behavioral: Negative.       Physical Exam:  height is '5\' 6"'$  (1.676 m). Her blood pressure is 73/48 (abnormal) and her pulse is 130 (abnormal). Her respiration is 19 and oxygen saturation is 95%.   Wt Readings from Last 3 Encounters:  10/24/22 43.1 kg  10/23/22 51.5 kg  10/22/22 51.4 kg    Physical Exam Vitals reviewed.  Constitutional:      Appearance: She is ill-appearing.  HENT:     Head: Normocephalic and atraumatic.     Mouth/Throat:     Mouth: Mucous membranes are dry.  Eyes:     General: No scleral icterus.    Pupils: Pupils are equal, round, and reactive to light.  Cardiovascular:     Rate and Rhythm: Tachycardia present.     Heart sounds: Normal heart sounds.  Pulmonary:     Effort: Pulmonary effort is normal.     Breath sounds: Normal breath sounds.  Abdominal:     General: Bowel sounds are normal.     Palpations: Abdomen is soft.  Musculoskeletal:     Comments: Right arm in sling  Lymphadenopathy:     Cervical: No cervical adenopathy.  Skin:    General: Skin is warm and dry.  Neurological:     Mental Status: She is alert and oriented to person, place, and time.  Psychiatric:        Behavior: Behavior normal.        Thought Content: Thought content normal.      Lab Results  Component Value  Date   WBC 9.1 10/24/2022   HGB 10.4 (L) 10/24/2022   HCT 32.9 (L) 10/24/2022   MCV 101.9 (H) 10/24/2022   PLT 286 10/24/2022   Lab Results  Component Value Date   FERRITIN 382 (H) 09/21/2022   IRON 15 (L) 09/21/2022   TIBC 111 (L) 09/21/2022   UIBC 96 (L) 09/21/2022   IRONPCTSAT 14 09/21/2022   Lab Results  Component Value Date   RETICCTPCT 0.6 09/21/2022   RBC 3.23 (L) 10/24/2022   Lab Results  Component Value  Date   KPAFRELGTCHN 20.9 (H) 11/16/2021   LAMBDASER 19.0 11/16/2021   KAPLAMBRATIO 1.10 11/16/2021   Lab Results  Component Value Date   IGGSERUM 168 (L) 08/21/2021   IGA 221 08/21/2021   IGMSERUM 1,672 (H) 08/21/2021   No results found for: "TOTALPROTELP", "ALBUMINELP", "A1GS", "A2GS", "BETS", "BETA2SER", "GAMS", "MSPIKE", "SPEI"   Chemistry      Component Value Date/Time   NA 138 10/24/2022 1409   NA 135 (A) 10/12/2022 1200   NA 138 08/16/2017 1256   K 4.5 10/24/2022 1409   K 4.3 08/16/2017 1256   CL 105 10/24/2022 1409   CO2 19 (L) 10/24/2022 1409   CO2 25 08/16/2017 1256   BUN 71 (H) 10/24/2022 1409   BUN 80 (A) 10/12/2022 1200   BUN 19.6 08/16/2017 1256   CREATININE 3.17 (HH) 10/24/2022 1409   CREATININE 0.7 08/16/2017 1256   GLU 106 10/12/2022 1200      Component Value Date/Time   CALCIUM 9.2 10/24/2022 1409   CALCIUM 9.2 08/16/2017 1256   ALKPHOS 114 10/24/2022 1409   ALKPHOS 42 08/16/2017 1256   AST 12 (L) 10/24/2022 1409   AST 18 08/16/2017 1256   ALT 6 10/24/2022 1409   ALT 11 08/16/2017 1256   BILITOT 0.2 (L) 10/24/2022 1409   BILITOT 0.87 08/16/2017 1256       Impression and Plan: Ms. Yount is a very pleasant 89 yo caucasian female with relapsed marginal zone lymphoma and multifactorial anemia.   She has most recently been on Calquence which was stopped on 09/21/2022 secondary to pancytopenia.  CBC from today has been reviewed and white blood cell count and platelets have now normalized.  She continues to have mild anemia but  overall stable to improved.  The biggest issue today is overall decline in performance status and AKI noted on lab work.  Her BUN today is 71 and creatinine is 3.17.  It appears that she has received IV fluids in the facility but despite this, AKI seems to be worsening.  I believe this is related to dehydration since she is not eating or drinking very well.  Additionally, she is hypotensive and tachycardic.  She is symptomatic from this.  I have discussed all the following above with the patient and her nephew.  Recommend for her to be evaluated in the emergency department with likely admission for dehydration.  After much discussion, she is agreeable to go down to the ED and will likely be admitted for AKI and dehydration.  For now, she will remain off the Calquence.  I will schedule routine follow-up for her with [place] in about 2 to 3 weeks.   Mikey Bussing, NP 1/24/20244:03 PM

## 2022-10-24 NOTE — ED Notes (Signed)
Blood Cultures x 2 obtained, #1 Lt AC, #2 Lt hand

## 2022-10-25 DIAGNOSIS — E861 Hypovolemia: Secondary | ICD-10-CM

## 2022-10-25 DIAGNOSIS — D630 Anemia in neoplastic disease: Secondary | ICD-10-CM | POA: Diagnosis not present

## 2022-10-25 DIAGNOSIS — E860 Dehydration: Secondary | ICD-10-CM | POA: Diagnosis not present

## 2022-10-25 DIAGNOSIS — N184 Chronic kidney disease, stage 4 (severe): Secondary | ICD-10-CM | POA: Diagnosis not present

## 2022-10-25 DIAGNOSIS — E039 Hypothyroidism, unspecified: Secondary | ICD-10-CM

## 2022-10-25 DIAGNOSIS — C8588 Other specified types of non-Hodgkin lymphoma, lymph nodes of multiple sites: Secondary | ICD-10-CM

## 2022-10-25 DIAGNOSIS — I9589 Other hypotension: Secondary | ICD-10-CM | POA: Diagnosis not present

## 2022-10-25 DIAGNOSIS — K219 Gastro-esophageal reflux disease without esophagitis: Secondary | ICD-10-CM

## 2022-10-25 LAB — CBC
HCT: 31.5 % — ABNORMAL LOW (ref 36.0–46.0)
Hemoglobin: 10 g/dL — ABNORMAL LOW (ref 12.0–15.0)
MCH: 32.9 pg (ref 26.0–34.0)
MCHC: 31.7 g/dL (ref 30.0–36.0)
MCV: 103.6 fL — ABNORMAL HIGH (ref 80.0–100.0)
Platelets: 247 10*3/uL (ref 150–400)
RBC: 3.04 MIL/uL — ABNORMAL LOW (ref 3.87–5.11)
RDW: 17.6 % — ABNORMAL HIGH (ref 11.5–15.5)
WBC: 7.6 10*3/uL (ref 4.0–10.5)
nRBC: 0 % (ref 0.0–0.2)

## 2022-10-25 LAB — COMPREHENSIVE METABOLIC PANEL
ALT: 10 U/L (ref 0–44)
AST: 18 U/L (ref 15–41)
Albumin: <1.5 g/dL — ABNORMAL LOW (ref 3.5–5.0)
Alkaline Phosphatase: 93 U/L (ref 38–126)
Anion gap: 10 (ref 5–15)
BUN: 54 mg/dL — ABNORMAL HIGH (ref 8–23)
CO2: 18 mmol/L — ABNORMAL LOW (ref 22–32)
Calcium: 7.7 mg/dL — ABNORMAL LOW (ref 8.9–10.3)
Chloride: 108 mmol/L (ref 98–111)
Creatinine, Ser: 2.64 mg/dL — ABNORMAL HIGH (ref 0.44–1.00)
GFR, Estimated: 16 mL/min — ABNORMAL LOW (ref >60)
Glucose, Bld: 112 mg/dL — ABNORMAL HIGH (ref 70–99)
Potassium: 3.8 mmol/L (ref 3.5–5.1)
Sodium: 136 mmol/L (ref 135–145)
Total Bilirubin: 0.5 mg/dL (ref 0.3–1.2)
Total Protein: 6.3 g/dL — ABNORMAL LOW (ref 6.5–8.1)

## 2022-10-25 MED ORDER — ADULT MULTIVITAMIN W/MINERALS CH
1.0000 | ORAL_TABLET | Freq: Every day | ORAL | Status: DC
  Administered 2022-10-25 – 2022-10-26 (×2): 1 via ORAL
  Filled 2022-10-25 (×2): qty 1

## 2022-10-25 MED ORDER — ONDANSETRON HCL 4 MG PO TABS: 4.0000 mg | ORAL_TABLET | Freq: Four times a day (QID) | ORAL | Status: DC | PRN

## 2022-10-25 MED ORDER — GABAPENTIN 300 MG PO CAPS
600.0000 mg | ORAL_CAPSULE | Freq: Three times a day (TID) | ORAL | Status: DC
  Administered 2022-10-25 – 2022-10-26 (×4): 600 mg via ORAL
  Filled 2022-10-25 (×4): qty 2

## 2022-10-25 MED ORDER — DEXTROSE-NACL 5-0.9 % IV SOLN: INTRAVENOUS | Status: DC

## 2022-10-25 MED ORDER — ASPIRIN 81 MG PO CHEW
81.0000 mg | CHEWABLE_TABLET | Freq: Every day | ORAL | Status: DC
  Administered 2022-10-25 – 2022-10-26 (×2): 81 mg via ORAL
  Filled 2022-10-25 (×2): qty 1

## 2022-10-25 MED ORDER — ONDANSETRON HCL 4 MG/2ML IJ SOLN: 4.0000 mg | Freq: Four times a day (QID) | INTRAMUSCULAR | Status: DC | PRN

## 2022-10-25 MED ORDER — VITAMIN B-12 1000 MCG PO TABS
1000.0000 ug | ORAL_TABLET | Freq: Every day | ORAL | Status: DC
  Administered 2022-10-25 – 2022-10-26 (×2): 1000 ug via ORAL
  Filled 2022-10-25 (×2): qty 1

## 2022-10-25 MED ORDER — TRAMADOL HCL 50 MG PO TABS: 25.0000 mg | ORAL_TABLET | Freq: Two times a day (BID) | ORAL | Status: DC | PRN

## 2022-10-25 MED ORDER — LEVOTHYROXINE SODIUM 100 MCG PO TABS
100.0000 ug | ORAL_TABLET | Freq: Every day | ORAL | Status: DC
  Administered 2022-10-25 – 2022-10-26 (×2): 100 ug via ORAL
  Filled 2022-10-25 (×2): qty 1

## 2022-10-25 MED ORDER — HEPARIN SODIUM (PORCINE) 5000 UNIT/ML IJ SOLN
5000.0000 [IU] | Freq: Three times a day (TID) | INTRAMUSCULAR | Status: DC
  Administered 2022-10-25 – 2022-10-26 (×4): 5000 [IU] via SUBCUTANEOUS
  Filled 2022-10-25 (×3): qty 1

## 2022-10-25 MED ORDER — PANTOPRAZOLE SODIUM 40 MG PO TBEC
40.0000 mg | DELAYED_RELEASE_TABLET | Freq: Two times a day (BID) | ORAL | Status: DC
  Administered 2022-10-25 – 2022-10-26 (×3): 40 mg via ORAL
  Filled 2022-10-25 (×3): qty 1

## 2022-10-25 MED ORDER — ACETAMINOPHEN 325 MG PO TABS: 650.0000 mg | ORAL_TABLET | Freq: Four times a day (QID) | ORAL | Status: DC | PRN

## 2022-10-25 NOTE — NC FL2 (Signed)
[place] LEVEL OF CARE FORM     IDENTIFICATION  Patient Name: Joy Lynch Birthdate: 1926-02-06 Sex: female Admission Date (Current Location): 10/24/2022  [facility] and [place] Number:  Herbalist and Address:  [facility] [place] [address] [place], [place] [address]      Provider Number: [id number]  Attending Physician Name and Address:  Jonetta Osgood, MD  Relative Name and Phone Number:       Current Level of Care: Hospital Recommended Level of Care: [place] Prior Approval Number:    Date Approved/Denied:   PASRR Number: [id number]  Discharge Plan: SNF    Current Diagnoses: Patient Active Problem List   Diagnosis Date Noted   CKD (chronic kidney disease), stage IV ([place]) 10/25/2022   Hypotension due to hypovolemia 10/24/2022   Acute kidney injury ([place]) 10/24/2022   Hypotension 10/24/2022   Dehydration 10/23/2022   Syncope 10/23/2022   Fracture of distal end of right humerus 10/02/2022   GERD (gastroesophageal reflux disease) 09/28/2022   Right leg pain 12/27/2020   CKD (chronic kidney disease) stage 3, GFR 30-59 ml/min ([place]) 11/11/2020   Hypoalbuminemia 10/17/2020   Left lower lobe pneumonia 09/13/2020   Left rib fracture 09/13/2020   Hyponatremia 08/30/2020   Hypothyroidism 08/30/2020   Cerebellar infarct ([place]) 08/30/2020   Gait abnormality 08/30/2020   Osteoporosis 08/30/2020   Migraines 08/30/2020   HTN (hypertension) 08/30/2020   Bartholin gland cyst 08/30/2020   Left-sided chest wall pain 08/30/2020   Dry eyes 08/30/2020   Pressure ulcer, stage 1 08/28/2020   SAH (subarachnoid hemorrhage) ([place]) 08/25/2020   Subarachnoid hemorrhage ([place]) 08/24/2020   Fall 08/24/2020   Macrocytic anemia 08/24/2020   Bilateral leg edema 03/07/2020   Right shoulder pain 05/24/2017   Pancytopenia, acquired ([place]) 02/12/2017   Other constipation 02/12/2017   Goals of care, counseling/discussion  01/24/2017   Lymphoma, marginal zone, lymph nodes of multiple sites ([place]) 12/24/2016   Anemia in neoplastic disease 12/24/2016   Trigeminal neuralgia 08/13/2016   Elevated total protein 06/26/2016   History of B-cell lymphoma 11/02/2015    Orientation RESPIRATION BLADDER Height & Weight     Self, Time, Situation, Place  Normal Incontinent, External catheter Weight: 95 lb (43.1 kg) Height:     BEHAVIORAL SYMPTOMS/MOOD NEUROLOGICAL BOWEL NUTRITION STATUS      Continent Diet (See DC Summary)  AMBULATORY STATUS COMMUNICATION OF NEEDS Skin   Extensive Assist Verbally Normal                       Personal Care Assistance Level of Assistance  Bathing, Feeding, Dressing Bathing Assistance: Maximum assistance Feeding assistance: Limited assistance Dressing Assistance: Maximum assistance     Functional Limitations Info  Sight, Hearing Sight Info: Impaired Hearing Info: Impaired      SPECIAL CARE FACTORS FREQUENCY  PT (By licensed PT), OT (By licensed OT)     PT Frequency: eval and treat OT Frequency: eval and treat            Contractures Contractures Info: Not present    Additional Factors Info  Code Status, Allergies Code Status Info: DNR Allergies Info: Penicillins, Tegretol (Carbamazepine), Lyrica (Pregabalin)           Current Medications (10/25/2022):  This is the current hospital active medication list Current Facility-Administered Medications  Medication Dose Route Frequency Provider Last Rate Last Admin   acetaminophen (TYLENOL) tablet 650 mg  650 mg Oral Q6H  PRN Jonetta Osgood, MD       aspirin chewable tablet 81 mg  81 mg Oral Daily Elwyn Reach, MD   81 mg at 10/25/22 6004   cyanocobalamin (VITAMIN B12) tablet 1,000 mcg  1,000 mcg Oral Daily Jonetta Osgood, MD   1,000 mcg at 10/25/22 1219   gabapentin (NEURONTIN) capsule 600 mg  600 mg Oral TID Jonetta Osgood, MD   600 mg at 10/25/22 5997   heparin injection 5,000 Units  5,000 Units  Subcutaneous Q8H Elwyn Reach, MD   5,000 Units at 10/25/22 7414   levothyroxine (SYNTHROID) tablet 100 mcg  100 mcg Oral QAC breakfast Elwyn Reach, MD   100 mcg at 10/25/22 2395   multivitamin with minerals tablet 1 tablet  1 tablet Oral Daily Jonetta Osgood, MD   1 tablet at 10/25/22 1219   ondansetron (ZOFRAN) tablet 4 mg  4 mg Oral Q6H PRN Elwyn Reach, MD       Or   ondansetron (ZOFRAN) injection 4 mg  4 mg Intravenous Q6H PRN Elwyn Reach, MD       pantoprazole (PROTONIX) EC tablet 40 mg  40 mg Oral BID Jonetta Osgood, MD   40 mg at 10/25/22 1219   traMADol (ULTRAM) tablet 25 mg  25 mg Oral Q12H PRN Elwyn Reach, MD       Facility-Administered Medications Ordered in Other Encounters  Medication Dose Route Frequency Provider Last Rate Last Admin   epoetin alfa-epbx (RETACRIT) injection 40,000 Units  40,000 Units Subcutaneous Once Celso Amy, NP         Discharge Medications: Please see discharge summary for a list of discharge medications.  Relevant Imaging Results:  Relevant Lab Results:   Additional Information SSN [id number]  Benard Halsted, LCSW

## 2022-10-25 NOTE — Evaluation (Signed)
Occupational Therapy Evaluation Patient Details Name: Joy Lynch MRN: [id number] DOB: [birth year] Today's Date: 10/25/2022   History of Present Illness 89 y/o F admitted to [facility] on 1/24 for hypotension. Pt was seen by her oncologist and told to come to the ED. Pt had a syncopal episode ~10 days ago and was seen in the ED on January 16, but returned back to her SNF. Of note, pt fell 12/29 and was seen, imaging showing acute comminuted and displaced spiral fracture of the distal humeral shaft of RUE distal to TSA. PMHx: B cell lymphoma, hypothyroidism, lymphedema, CKD, GERD, HTN, last chemotherapy in December.   Clinical Impression   Pt reports she has been in SNF at [facility] Prior to her R humerus fx, she could self feed, groom and participated in UB bathing and dressing. She was dependent in LB ADLs and toileting. She reports she has 2 person assist for transfers and was propelling her w/c with her feet. Pt presents with generalized weakness, pain in R shoulder with adjustment of shoulder immobilizer and poor sitting balance. She is able to self feed and groom with min assist using her L hand. She is dependent in bathing, dressing and toileting. Pt demonstrates poor sitting balance with posterior lean. Did not attempt transfer this visit. Pt sat EOB with intermittent support x 10 minutes. Plan is for pt to return to [facility].     Recommendations for follow up therapy are one component of a multi-disciplinary discharge planning process, led by the attending physician.  Recommendations may be updated based on patient status, additional functional criteria and insurance authorization.   Follow Up Recommendations  Skilled nursing-short term rehab (<3 hours/day)     Assistance Recommended at Discharge Frequent or constant Supervision/Assistance  Patient can return home with the following Two people to help with walking and/or transfers;A lot of help with bathing/dressing/bathroom;Assistance  with feeding;Assistance with cooking/housework;Direct supervision/assist for medications management;Direct supervision/assist for financial management;Assist for transportation;Help with stairs or ramp for entrance    Functional Status Assessment  Patient has had a recent decline in their functional status and/or demonstrates limited ability to make significant improvements in function in a reasonable and predictable amount of time  Equipment Recommendations  None recommended by OT    Recommendations for Other Services       Precautions / Restrictions Precautions Precautions: Fall Precaution Comments: watch BP and HR, shoulder Required Braces or Orthoses: Sling;Splint/Cast Splint/Cast: rigid R shoulder splint with shoulder immobilizer Restrictions Weight Bearing Restrictions: Yes RUE Weight Bearing: Non weight bearing      Mobility Bed Mobility Overal bed mobility: Needs Assistance Bed Mobility: Supine to Sit, Sit to Supine     Supine to sit: Mod assist, HOB elevated Sit to supine: Max assist, HOB elevated   General bed mobility comments: up to L side of bed to minimize risk of weight through R UE, cues to technique, assist for trunk and LEs    Transfers                   General transfer comment: did not attempt in absence of second person      Balance Overall balance assessment: Needs assistance   Sitting balance-Leahy Scale: Poor Sitting balance - Comments: min assist, posterior lean                                   ADL either performed or assessed with  clinical judgement   ADL Overall ADL's : Needs assistance/impaired Eating/Feeding: Minimal assistance;Bed level;Sitting Eating/Feeding Details (indicate cue type and reason): assist to open containers and cut food, self feeds with L hand Grooming: Wash/dry hands;Brushing hair;Wash/dry face;Minimal assistance;Sitting   Upper Body Bathing: Maximal assistance;Sitting   Lower Body Bathing:  Total assistance;Bed level;Sit to/from stand   Upper Body Dressing : Maximal assistance;Sitting   Lower Body Dressing: Total assistance;Bed level                       Vision Ability to See in Adequate Light: 0 Adequate       Perception     Praxis      Pertinent Vitals/Pain Pain Assessment Pain Assessment: Faces Faces Pain Scale: Hurts little more Pain Location: R shoulder with adjustment of shoulder immobilizer Pain Descriptors / Indicators: Grimacing, Guarding Pain Intervention(s): Monitored during session, Repositioned     Hand Dominance Right   Extremity/Trunk Assessment Upper Extremity Assessment Upper Extremity Assessment: RUE deficits/detail RUE Deficits / Details: splinted shoulder, full AROM elbow to hand RUE: Unable to fully assess due to immobilization RUE Coordination: decreased gross motor   Lower Extremity Assessment Lower Extremity Assessment: Defer to PT evaluation   Cervical / Trunk Assessment Cervical / Trunk Assessment: Kyphotic   Communication Communication Communication: HOH   Cognition Arousal/Alertness: Awake/alert Behavior During Therapy: WFL for tasks assessed/performed Overall Cognitive Status: No family/caregiver present to determine baseline cognitive functioning                                 General Comments: likely baseline     General Comments       Exercises     Shoulder Instructions      Home Living Family/patient expects to be discharged to:: Skilled nursing facility                                 Additional Comments: pt is a resident on ALF at [facility], currently in SNF      Prior Functioning/Environment Prior Level of Function : Needs assist;History of Falls (last six months)             Mobility Comments: pt reports she utilizes a wheelchair for mobility but requires 2 person assist for transfers and bed mobility, propels wheelchair with BLE ADLs Comments: self  feeds, grooms, participates in UB ADLs, dependent in LB ADLs and toileting        OT Problem List: Decreased strength;Decreased activity tolerance;Impaired balance (sitting and/or standing);Decreased coordination;Decreased cognition;Decreased safety awareness;Pain;Impaired UE functional use;Decreased range of motion      OT Treatment/Interventions: Self-care/ADL training;Therapeutic exercise;DME and/or AE instruction;Therapeutic activities;Patient/family education;Balance training    OT Goals(Current goals can be found in the care plan section) Acute Rehab OT Goals OT Goal Formulation: With patient Time For Goal Achievement: 11/08/22 Potential to Achieve Goals: Fair ADL Goals Pt Will Perform Upper Body Bathing: with mod assist;sitting Pt Will Perform Upper Body Dressing: with mod assist;sitting Pt Will Transfer to Toilet: with +2 assist;with mod assist;bedside commode;stand pivot transfer Pt/caregiver will Perform Home Exercise Program: Increased ROM;Right Upper extremity;With Supervision (AROM R elbow to hand) Additional ADL Goal #1: Pt will demonstrate fair sitting balance as a precursor to ADLs. Additional ADL Goal #2: Pt will perform bed mobility toward L with min assist in preparation for ADLs.  OT Frequency:  Min 2X/week    Co-evaluation              AM-PAC OT "6 Clicks" Daily Activity     Outcome Measure Help from another person eating meals?: A Little Help from another person taking care of personal grooming?: A Little Help from another person toileting, which includes using toliet, bedpan, or urinal?: Total Help from another person bathing (including washing, rinsing, drying)?: A Lot Help from another person to put on and taking off regular upper body clothing?: A Lot Help from another person to put on and taking off regular lower body clothing?: Total 6 Click Score: 12   End of Session    Activity Tolerance: Patient tolerated treatment well Patient left: in  bed;with call bell/phone within reach;with bed alarm set  OT Visit Diagnosis: Pain;Muscle weakness (generalized) (M62.81);Other symptoms and signs involving cognitive function;Unsteadiness on feet (R26.81) Pain - Right/Left: Right Pain - part of body: Shoulder                Time: 1535-1555 OT Time Calculation (min): 20 min Charges:  OT General Charges $OT Visit: 1 Visit OT Evaluation $OT Eval Moderate Complexity: [place], OTR/L Acute Rehabilitation Services Office: [phone]   Malka So 10/25/2022, 4:12 PM

## 2022-10-25 NOTE — Evaluation (Signed)
Physical Therapy Evaluation Patient Details Name: Joy Lynch MRN: [id number] DOB: 01-01-1926 Today's Date: 10/25/2022  History of Present Illness  89 y/o F admitted to [facility] on 1/24 for hypotension. Pt was seen by her oncologist and told to come to the ED. Pt had a syncopal episode ~10 days ago and was seen in the ED on January 16, but returned back to her SNF. Of note, pt fell 12/29 and was seen, imaging showing acute comminuted and displaced spiral fracture of the distal humeral shaft of RUE. PMHx: B cell lymphoma, hypothyroidism, lymphedema, CKD, GERD, HTN, last chemotherapy in December.  Clinical Impression  Pt presents today functioning below her mobility baseline, reporting feeling weaker than normal with deficits in strength, balance, and activity tolerance. Per chart review, pt was at a SNF prior to admission, pt reporting she requires assist with transfers, and bed mobility but is able to propel her wheelchair with BLE. Pt had a fall ~1 month ago resulting in a fracture of RUE, splint and sling in place. Pt performed bed mobility with mod-maxA today, requiring minA for EOB sitting balance, pt declining any attempts at transfers or standing. Pt will continue to benefit from skilled acute PT at this time to progress mobility and deficits, recommend return to SNF at discharge. Acute PT will follow as appropriate.        Recommendations for follow up therapy are one component of a multi-disciplinary discharge planning process, led by the attending physician.  Recommendations may be updated based on patient status, additional functional criteria and insurance authorization.  Follow Up Recommendations Skilled nursing-short term rehab (<3 hours/day) Can patient physically be transported by private vehicle: No    Assistance Recommended at Discharge Frequent or constant Supervision/Assistance  Patient can return home with the following  A lot of help with walking and/or transfers;A lot of help with  bathing/dressing/bathroom;Assistance with cooking/housework;Direct supervision/assist for medications management;Direct supervision/assist for financial management;Assist for transportation;Help with stairs or ramp for entrance    Equipment Recommendations None recommended by PT  Recommendations for Other Services       Functional Status Assessment Patient has had a recent decline in their functional status and demonstrates the ability to make significant improvements in function in a reasonable and predictable amount of time.     Precautions / Restrictions Precautions Precautions: Fall Precaution Comments: watch BP and HR Required Braces or Orthoses: Sling;Splint/Cast Splint/Cast: RUE hard splint in place with sling Restrictions Weight Bearing Restrictions: Yes RUE Weight Bearing: Non weight bearing Other Position/Activity Restrictions: pt with fracture on 12/29, splint and sling in place      Mobility  Bed Mobility Overal bed mobility: Needs Assistance Bed Mobility: Supine to Sit, Sit to Supine     Supine to sit: Mod assist, HOB elevated Sit to supine: Max assist, HOB elevated   General bed mobility comments: cueing required throughout for technique, physical assist needed for balance and strength    Transfers                   General transfer comment: pt declining attempts at transfers today    Ambulation/Gait                  Stairs            Wheelchair Mobility    Modified Rankin (Stroke Patients Only)       Balance Overall balance assessment: Needs assistance Sitting-balance support: Feet supported, Single extremity supported Sitting balance-Leahy Scale: Poor Sitting balance -  Comments: pt varying between minA and min guard for assistance while seated EOB, noted posterior lean, intermittently improving Postural control: Posterior lean                                   Pertinent Vitals/Pain Pain Assessment Pain  Assessment: No/denies pain    Home Living Family/patient expects to be discharged to:: Skilled nursing facility                        Prior Function Prior Level of Function : Needs assist;History of Falls (last six months)       Physical Assist : Mobility (physical);ADLs (physical) Mobility (physical): Bed mobility;Transfers ADLs (physical): Bathing;Toileting Mobility Comments: pt reports she utilizes a wheelchair for mobility but requires assit for transfers and bed mobility, propels wheelchair with BLE ADLs Comments: pt reports she has assist for bathing and toileting     Hand Dominance        Extremity/Trunk Assessment   Upper Extremity Assessment Upper Extremity Assessment: Defer to OT evaluation    Lower Extremity Assessment Lower Extremity Assessment: Generalized weakness    Cervical / Trunk Assessment Cervical / Trunk Assessment: Kyphotic  Communication   Communication: HOH  Cognition Arousal/Alertness: Awake/alert Behavior During Therapy: WFL for tasks assessed/performed Overall Cognitive Status: No family/caregiver present to determine baseline cognitive functioning                                 General Comments: pt oriented to self, place, and time, but requires cue for situation, history provided is questionable due to pt referring between ALF and SNF        General Comments General comments (skin integrity, edema, etc.): HR elevated to 126 while sitting EOB, decreases back to 110s when supine, SPO2 stable on room air    Exercises     Assessment/Plan    PT Assessment Patient needs continued PT services  PT Problem List Decreased strength;Decreased activity tolerance;Decreased balance;Decreased mobility;Decreased safety awareness       PT Treatment Interventions DME instruction;Functional mobility training;Therapeutic activities;Therapeutic exercise;Balance training;Neuromuscular re-education;Patient/family  education;Wheelchair mobility training    PT Goals (Current goals can be found in the Care Plan section)  Acute Rehab PT Goals Patient Stated Goal: to go home PT Goal Formulation: With patient Time For Goal Achievement: 11/08/22 Potential to Achieve Goals: Good    Frequency Min 2X/week     Co-evaluation               AM-PAC PT "6 Clicks" Mobility  Outcome Measure Help needed turning from your back to your side while in a flat bed without using bedrails?: A Lot Help needed moving from lying on your back to sitting on the side of a flat bed without using bedrails?: A Lot Help needed moving to and from a bed to a chair (including a wheelchair)?: A Lot Help needed standing up from a chair using your arms (e.g., wheelchair or bedside chair)?: A Lot Help needed to walk in hospital room?: Total Help needed climbing 3-5 steps with a railing? : Total 6 Click Score: 10    End of Session   Activity Tolerance: Patient limited by fatigue Patient left: in bed;with call bell/phone within reach;with bed alarm set Nurse Communication: Mobility status PT Visit Diagnosis: Muscle weakness (generalized) (M62.81);History of falling (Z91.81);Difficulty in walking,  not elsewhere classified (R26.2)    Time: 2518-9842 PT Time Calculation (min) (ACUTE ONLY): 16 min   Charges:   PT Evaluation $PT Eval Low Complexity: 1 Low          Charlynne Cousins, PT DPT Acute Rehabilitation Services Office [phone]   Luvenia Heller 10/25/2022, 12:06 PM

## 2022-10-25 NOTE — Progress Notes (Signed)
PROGRESS NOTE        PATIENT DETAILS Name: Joy Lynch Age: 89 y.o. Sex: female Date of Birth: 1926/09/24 Admit Date: 10/24/2022 Admitting Physician Rhetta Mura, DO IRJ:JOACZ, Rene Kocher, MD  Brief Summary: Patient is a 89 y.o.  female with marginal zone lymphoma, hypothyroidism who [facility] for failure to thrive syndrome/poor oral intake/weakness-she was found to have hypotension-and subsequently referred to the hospitalist service for further evaluation and treatment.  Significant events: 1/24>> to ED for evaluation of weakness-found to have hypotension-admit to [facility]  Significant studies: 1/24>> CXR: No PNA 1/24>> bilateral lower extremity Doppler: No DVT  Significant microbiology data: 1/24>> blood culture: No growth 1/24>> COVID/influenza/RSV PCR: Negative  Procedures: None  Consults: None  Subjective: Lying comfortably in bed-denies any chest pain or shortness of breath.  Denies any chest pain, nausea, vomiting, diarrhea.  Objective: Vitals: Blood pressure 130/63, pulse (!) 115, temperature 98.2 F (36.8 C), temperature source Oral, resp. rate 18, weight 43.1 kg, SpO2 93 %.   Exam: Gen Exam:Alert awake-not in any distress HEENT:atraumatic, normocephalic Chest: B/L clear to auscultation anteriorly CVS:S1S2 regular Abdomen:soft non tender, non distended Extremities:no edema Neurology: Non focal Skin: no rash  Pertinent Labs/Radiology:    Latest Ref Rng & Units 10/25/2022    8:00 AM 10/24/2022    4:02 PM 10/24/2022    2:09 PM  CBC  WBC 4.0 - 10.5 K/uL 7.6  8.5  9.1   Hemoglobin 12.0 - 15.0 g/dL 10.0  9.5  10.4   Hematocrit 36.0 - 46.0 % 31.5  29.7  32.9   Platelets 150 - 400 K/uL 247  286  286     Lab Results  Component Value Date   NA 136 10/25/2022   K 3.8 10/25/2022   CL 108 10/25/2022   CO2 18 (L) 10/25/2022      Assessment/Plan: Hypotension Thought to be due to hypovolemia in the setting of poor  oral intake No history for blood loss/GI loss Clinical scenario not consistent with sepsis BP much better following IV fluid hydration-plan would be to stop IVF and monitor  AKI on CKD stage IV AKI likely hemodynamically mediated in the setting of hypotension/dehydration Creatinine much better following IVF hydration-and back to close to baseline Avoid nephrotoxic agents Follow creatinine trend.    History of marginal cell lymphoma Off all treatment since December Follow-up with Dr. Marin Olp postdischarge  Macrocytic anemia Likely due to underlying lymphoma Chronic issue Follow-up with oncology/hematology  Hypothyroidism Continue with levothyroxine  Moderate to severe protein calorie malnutrition Nutrition eval  Frailty Failure to thrive syndrome Difficult situation-seems to be slowly deteriorating over the past several months Will need palliative care follow-up at SNF DNR in place  Underweight: Estimated body mass index is 15.33 kg/m as calculated from the following:   Height as of an earlier encounter on 10/24/22: '5\' 6"'$  (1.676 m).   Weight as of this encounter: 43.1 kg.   Code status:   Code Status: DNR   DVT Prophylaxis: heparin injection 5,000 Units Start: 10/25/22 0600 SCDs Start: 10/25/22 0005   Family Communication: Nephew-William [phone] updated over the phone on 1/25   Disposition Plan: Status is: Inpatient Remains inpatient appropriate because: Severity of illness   Planned Discharge Destination:Skilled nursing facility   Diet: Diet Order  Diet Heart Room service appropriate? Yes; Fluid consistency: Thin  Diet effective now                     Antimicrobial agents: Anti-infectives (From admission, onward)    None        MEDICATIONS: Scheduled Meds:  aspirin  81 mg Oral Daily   gabapentin  600 mg Oral TID   heparin  5,000 Units Subcutaneous Q8H   levothyroxine  100 mcg Oral QAC breakfast   Continuous  Infusions:  dextrose 5 % and 0.9% NaCl 100 mL/hr at 10/25/22 0041   PRN Meds:.ondansetron **OR** ondansetron (ZOFRAN) IV, traMADol   I have personally reviewed following labs and imaging studies  LABORATORY DATA: CBC: Recent Labs  Lab 10/24/22 1409 10/24/22 1602 10/25/22 0800  WBC 9.1 8.5 7.6  NEUTROABS 2.4 2.5  --   HGB 10.4* 9.5* 10.0*  HCT 32.9* 29.7* 31.5*  MCV 101.9* 99.3 103.6*  PLT 286 286 505    Basic Metabolic Panel: Recent Labs  Lab 10/24/22 1409 10/24/22 1602 10/25/22 0800  NA 138 136 136  K 4.5 4.1 3.8  CL 105 108 108  CO2 19* 18* 18*  GLUCOSE 118* 118* 112*  BUN 71* 71* 54*  CREATININE 3.17* 2.99* 2.64*  CALCIUM 9.2 7.9* 7.7*    GFR: Estimated Creatinine Clearance: 8.9 mL/min (A) (by C-G formula based on SCr of 2.64 mg/dL (H)).  Liver Function Tests: Recent Labs  Lab 10/24/22 1409 10/24/22 1602 10/25/22 0800  AST 12* 17 18  ALT '6 9 10  '$ ALKPHOS 114 109 93  BILITOT 0.2* 0.4 0.5  PROT 6.8 6.6 6.3*  ALBUMIN 2.4* 1.6* <1.5*   No results for input(s): "LIPASE", "AMYLASE" in the last 168 hours. No results for input(s): "AMMONIA" in the last 168 hours.  Coagulation Profile: Recent Labs  Lab 10/24/22 1602  INR 1.0    Cardiac Enzymes: No results for input(s): "CKTOTAL", "CKMB", "CKMBINDEX", "TROPONINI" in the last 168 hours.  BNP (last 3 results) No results for input(s): "PROBNP" in the last 8760 hours.  Lipid Profile: No results for input(s): "CHOL", "HDL", "LDLCALC", "TRIG", "CHOLHDL", "LDLDIRECT" in the last 72 hours.  Thyroid Function Tests: No results for input(s): "TSH", "T4TOTAL", "FREET4", "T3FREE", "THYROIDAB" in the last 72 hours.  Anemia Panel: No results for input(s): "VITAMINB12", "FOLATE", "FERRITIN", "TIBC", "IRON", "RETICCTPCT" in the last 72 hours.  Urine analysis:    Component Value Date/Time   COLORURINE YELLOW 10/24/2022 2005   APPEARANCEUR CLEAR 10/24/2022 2005   LABSPEC 1.020 10/24/2022 2005   PHURINE 5.5  10/24/2022 2005   GLUCOSEU 100 (A) 10/24/2022 2005   HGBUR TRACE (A) 10/24/2022 2005   BILIRUBINUR NEGATIVE 10/24/2022 2005   KETONESUR NEGATIVE 10/24/2022 2005   PROTEINUR >=300 (A) 10/24/2022 2005   NITRITE NEGATIVE 10/24/2022 2005   LEUKOCYTESUR NEGATIVE 10/24/2022 2005    Sepsis Labs: Lactic Acid, Venous    Component Value Date/Time   LATICACIDVEN 1.6 10/24/2022 1602    MICROBIOLOGY: Recent Results (from the past 240 hour(s))  Resp panel by RT-PCR (RSV, Flu A&B, Covid) Anterior Nasal Swab     Status: None   Collection Time: 10/16/22 10:42 AM   Specimen: Anterior Nasal Swab  Result Value Ref Range Status   SARS Coronavirus 2 by RT PCR NEGATIVE NEGATIVE Final    Comment: (NOTE) SARS-CoV-2 target nucleic acids are NOT DETECTED.  The SARS-CoV-2 RNA is generally detectable in upper respiratory specimens during the acute phase of infection. The lowest concentration  of SARS-CoV-2 viral copies this assay can detect is 138 copies/mL. A negative result does not preclude SARS-Cov-2 infection and should not be used as the sole basis for treatment or other patient management decisions. A negative result may occur with  improper specimen collection/handling, submission of specimen other than nasopharyngeal swab, presence of viral mutation(s) within the areas targeted by this assay, and inadequate number of viral copies(<138 copies/mL). A negative result must be combined with clinical observations, patient history, and epidemiological information. The expected result is Negative.  Fact Sheet for Patients:  EntrepreneurPulse.com.au  Fact Sheet for Healthcare Providers:  IncredibleEmployment.be  This test is no t yet approved or cleared by the [place] FDA and  has been authorized for detection and/or diagnosis of SARS-CoV-2 by FDA under an Emergency Use Authorization (EUA). This EUA will remain  in effect (meaning this test can be used) for  the duration of the COVID-19 declaration under Section 564(b)(1) of the Act, 21 U.S.C.section 360bbb-3(b)(1), unless the authorization is terminated  or revoked sooner.       Influenza A by PCR NEGATIVE NEGATIVE Final   Influenza B by PCR NEGATIVE NEGATIVE Final    Comment: (NOTE) The Xpert Xpress SARS-CoV-2/FLU/RSV plus assay is intended as an aid in the diagnosis of influenza from Nasopharyngeal swab specimens and should not be used as a sole basis for treatment. Nasal washings and aspirates are unacceptable for Xpert Xpress SARS-CoV-2/FLU/RSV testing.  Fact Sheet for Patients: EntrepreneurPulse.com.au  Fact Sheet for Healthcare Providers: IncredibleEmployment.be  This test is not yet approved or cleared by the [place] FDA and has been authorized for detection and/or diagnosis of SARS-CoV-2 by FDA under an Emergency Use Authorization (EUA). This EUA will remain in effect (meaning this test can be used) for the duration of the COVID-19 declaration under Section 564(b)(1) of the Act, 21 U.S.C. section 360bbb-3(b)(1), unless the authorization is terminated or revoked.     Resp Syncytial Virus by PCR NEGATIVE NEGATIVE Final    Comment: (NOTE) Fact Sheet for Patients: EntrepreneurPulse.com.au  Fact Sheet for Healthcare Providers: IncredibleEmployment.be  This test is not yet approved or cleared by the [place] FDA and has been authorized for detection and/or diagnosis of SARS-CoV-2 by FDA under an Emergency Use Authorization (EUA). This EUA will remain in effect (meaning this test can be used) for the duration of the COVID-19 declaration under Section 564(b)(1) of the Act, 21 U.S.C. section 360bbb-3(b)(1), unless the authorization is terminated or revoked.  Performed at [facility], [place] [address], [place], [place] [address]   Blood Culture (routine x 2)     Status: None  (Preliminary result)   Collection Time: 10/24/22  4:00 PM   Specimen: BLOOD  Result Value Ref Range Status   Specimen Description   Final    BLOOD LEFT ANTECUBITAL Performed at [facility], [address] [place], [place] [address]    Special Requests   Final    BOTTLES DRAWN AEROBIC AND ANAEROBIC Blood Culture adequate volume Performed at [facility], [address] [place], [place] [address]    Culture   Final    NO GROWTH < 12 HOURS Performed at [facility], [place] [address], [place],  [address]    Report Status PENDING  Incomplete  Blood Culture (routine x 2)     Status: None (Preliminary result)   Collection Time: 10/24/22  4:02 PM   Specimen: BLOOD  Result Value Ref Range  Status   Specimen Description   Final    BLOOD BLOOD LEFT HAND Performed at [facility], [address] [place], [place] [address]    Special Requests   Final    BOTTLES DRAWN AEROBIC AND ANAEROBIC Blood Culture adequate volume Performed at [facility], [address] [place], [place] [address]    Culture   Final    NO GROWTH < 12 HOURS Performed at [facility], [place] [address], [place], [place] [address]    Report Status PENDING  Incomplete  Resp panel by RT-PCR (RSV, Flu A&B, Covid) Peripheral     Status: None   Collection Time: 10/24/22  4:02 PM   Specimen: Peripheral; Nasal Swab  Result Value Ref Range Status   SARS Coronavirus 2 by RT PCR NEGATIVE NEGATIVE Final    Comment: (NOTE) SARS-CoV-2 target nucleic acids are NOT DETECTED.  The SARS-CoV-2 RNA is generally detectable in upper respiratory specimens during the acute phase of infection. The lowest concentration of SARS-CoV-2 viral copies this assay can detect is 138 copies/mL. A negative result does not preclude SARS-Cov-2 infection and should not be used as the sole basis for treatment or other patient management decisions. A negative result may occur with  improper  specimen collection/handling, submission of specimen other than nasopharyngeal swab, presence of viral mutation(s) within the areas targeted by this assay, and inadequate number of viral copies(<138 copies/mL). A negative result must be combined with clinical observations, patient history, and epidemiological information. The expected result is Negative.  Fact Sheet for Patients:  EntrepreneurPulse.com.au  Fact Sheet for Healthcare Providers:  IncredibleEmployment.be  This test is no t yet approved or cleared by the [place] FDA and  has been authorized for detection and/or diagnosis of SARS-CoV-2 by FDA under an Emergency Use Authorization (EUA). This EUA will remain  in effect (meaning this test can be used) for the duration of the COVID-19 declaration under Section 564(b)(1) of the Act, 21 U.S.C.section 360bbb-3(b)(1), unless the authorization is terminated  or revoked sooner.       Influenza A by PCR NEGATIVE NEGATIVE Final   Influenza B by PCR NEGATIVE NEGATIVE Final    Comment: (NOTE) The Xpert Xpress SARS-CoV-2/FLU/RSV plus assay is intended as an aid in the diagnosis of influenza from Nasopharyngeal swab specimens and should not be used as a sole basis for treatment. Nasal washings and aspirates are unacceptable for Xpert Xpress SARS-CoV-2/FLU/RSV testing.  Fact Sheet for Patients: EntrepreneurPulse.com.au  Fact Sheet for Healthcare Providers: IncredibleEmployment.be  This test is not yet approved or cleared by the [place] FDA and has been authorized for detection and/or diagnosis of SARS-CoV-2 by FDA under an Emergency Use Authorization (EUA). This EUA will remain in effect (meaning this test can be used) for the duration of the COVID-19 declaration under Section 564(b)(1) of the Act, 21 U.S.C. section 360bbb-3(b)(1), unless the authorization is terminated or revoked.     Resp  Syncytial Virus by PCR NEGATIVE NEGATIVE Final    Comment: (NOTE) Fact Sheet for Patients: EntrepreneurPulse.com.au  Fact Sheet for Healthcare Providers: IncredibleEmployment.be  This test is not yet approved or cleared by the [place] FDA and has been authorized for detection and/or diagnosis of SARS-CoV-2 by FDA under an Emergency Use Authorization (EUA). This EUA will remain in effect (meaning this test can be used) for the duration of the COVID-19 declaration under Section 564(b)(1) of the Act, 21 U.S.C. section 360bbb-3(b)(1), unless the authorization is  terminated or revoked.  Performed at [facility], [address] [place], [place] [address]     RADIOLOGY STUDIES/RESULTS: US Venous Img Lower Bilateral  Result Date: 10/24/2022 CLINICAL DATA:  Short of breath, evaluate for deep venous thrombosis EXAM: Bilateral LOWER EXTREMITY VENOUS DOPPLER ULTRASOUND TECHNIQUE: Gray-scale sonography with compression, as well as color and duplex ultrasound, were performed to evaluate the deep venous system(s) from the level of the common femoral vein through the popliteal and proximal calf veins. COMPARISON:  None Available. FINDINGS: VENOUS Normal compressibility of the common femoral, superficial femoral, and popliteal veins, as well as the visualized calf veins. Visualized portions of profunda femoral vein and great saphenous vein unremarkable. No filling defects to suggest DVT on grayscale or color Doppler imaging. Doppler waveforms show normal direction of venous flow, normal respiratory plasticity and response to augmentation. Limited views of the contralateral common femoral vein are unremarkable. OTHER None. Limitations: none IMPRESSION: No evidence of deep venous thrombosis within LEFT or RIGHT lower extremity. Electronically Signed   By: Suzy Bouchard M.D.   On: 10/24/2022 18:33   DG Chest Port 1 View  Result Date:  10/24/2022 CLINICAL DATA:  Sepsis EXAM: PORTABLE CHEST 1 VIEW COMPARISON:  10/16/2022 x-ray FINDINGS: Slightly elevated right hemidiaphragm. No consolidation, pneumothorax or effusion. Normal cardiopericardial silhouette without edema. Overlapping cardiac leads. Right shoulder arthroplasty. Film is slightly rotated. Old rib fractures. IMPRESSION: No active disease.  No acute cardiopulmonary disease. Electronically Signed   By: Jill Side M.D.   On: 10/24/2022 15:59     LOS: 1 day   Oren Binet, MD  [facility]    To contact the attending provider between 7A-7P or the covering provider during after hours 7P-7A, please log into the web site [email or web address] and access using universal [place] password for that web site. If you do not have the password, please call the hospital operator.  10/25/2022, 10:09 AM

## 2022-10-25 NOTE — Progress Notes (Signed)
Initial Nutrition Assessment  DOCUMENTATION CODES:   Non-severe (moderate) malnutrition in context of chronic illness, Underweight  INTERVENTION:   Recommend obtaining new weight Liberalize pt diet to regular due to malnutrition Meal ordering with assist Encourage good PO intake Assist with meal set-up and feeding  Multivitamin w/ minerals daily  NUTRITION DIAGNOSIS:   Moderate Malnutrition related to chronic illness as evidenced by severe muscle depletion, moderate fat depletion.  GOAL:   Patient will meet greater than or equal to 90% of their needs  MONITOR:   PO intake, Supplement acceptance, Weight trends, Labs, I & O's  REASON FOR ASSESSMENT:   Consult Assessment of nutrition requirement/status  ASSESSMENT:   89 y.o. female presented [facility] from cancer center due to low BP. PMH includes B-cell lymphoma s/p chemotherapy, hypothyroidism, lymphedema, HTN, CKD III, GERD, and anemia. Pt admitted with hypotension and palpitations.   Discussed with MD in rounds.   Pt laying in bed. Reports that her appetite is fairly well PTA. Denies using any oral nutrition supplements. Endorses some diarrhea with chemo, but is no longer having any. Does not think that she has had any breakfast, RD to order pt tray for her. Pt with recent R arm fracture in sling; pt will need assist in tray set-up/feeding.   Pt unsure if any recent weight loss, but does not think so. Suspect that current weight is stated versus actual weight. Reports that she typically uses a walker to ambulate until recently needing the wheelchair due to arm fracture.  Discussed with RN.  Medications reviewed and include: Vitamin B12, MVI, Protonix Labs reviewed: BUN 54, Creatinine 2.64   NUTRITION - FOCUSED PHYSICAL EXAM:  Flowsheet Row Most Recent Value  Orbital Region Mild depletion  Upper Arm Region Moderate depletion  Thoracic and Lumbar Region Moderate depletion  Buccal Region No depletion  Temple Region  Moderate depletion  Clavicle Bone Region Severe depletion  Clavicle and Acromion Bone Region Severe depletion  Scapular Bone Region Severe depletion  Dorsal Hand Severe depletion  Patellar Region Severe depletion  Anterior Thigh Region Severe depletion  Posterior Calf Region Severe depletion  Edema (RD Assessment) None  Hair Reviewed  Eyes Reviewed  Mouth Reviewed  Skin Reviewed  Nails Reviewed   Diet Order:   Diet Order             Diet regular Room service appropriate? Yes with Assist; Fluid consistency: Thin  Diet effective now                   EDUCATION NEEDS:   No education needs have been identified at this time  Skin:  Skin Assessment: Reviewed RN Assessment  Last BM:  1/23  Height:   Ht Readings from Last 1 Encounters:  10/24/22 '5\' 6"'$  (1.676 m)    Weight:   Wt Readings from Last 1 Encounters:  10/24/22 43.1 kg    Ideal Body Weight:  59.1 kg  BMI:  Body mass index is 15.33 kg/m.  Estimated Nutritional Needs:  Kcal:  1600-1800 Protein:  80-95 grams Fluid:  >/= 1.6 L   Hermina Barters RD, LDN Clinical Dietitian See [facility] for contact information.

## 2022-10-25 NOTE — TOC Initial Note (Signed)
Transition of Care [facility]) - Initial/Assessment Note    Patient Details  Name: Joy Lynch MRN: [id number] Date of Birth: [birth year]  Transition of Care [facility]) CM/SW Contact:    Benard Halsted, LCSW Phone Number: 10/25/2022, 2:11 PM  Clinical Narrative:                 CSW received return call from [place] with [facility]. She confirmed patient is from their SNF and can return (no insurance auth needed as it is private pay and she can receive therapies under part B).   Expected Discharge Plan: Skilled Nursing Facility Barriers to Discharge: Continued Medical Work up   Patient Goals and CMS Choice Patient states their goals for this hospitalization and ongoing recovery are:: Return to SNF CMS Medicare.gov Compare Post Acute Care list provided to:: Patient Choice offered to / list presented to : Patient [place] ownership interest in [facility].provided to:: Patient    Expected Discharge Plan and Services In-house Referral: Clinical Social Work   Post Acute Care Choice: Nursing Home Living arrangements for the past 2 months: [place]                                      Prior Living Arrangements/Services Living arrangements for the past 2 months: [place] Lives with:: Facility Resident Patient language and need for interpreter reviewed:: Yes Do you feel safe going back to the place where you live?: Yes      Need for Family Participation in Patient Care: No (Comment) Care giver support system in place?: Yes (comment)   Criminal Activity/Legal Involvement Pertinent to Current Situation/Hospitalization: No - Comment as needed  Activities of Daily Living      Permission Sought/Granted Permission sought to share information with : Facility Sport and exercise psychologist, Family Supports Permission granted to share information with : Yes, Verbal Permission Granted     Permission granted to share info w AGENCY: [facility]         Emotional Assessment Appearance:: Appears stated age     Orientation: : Oriented to Self, Oriented to Place, Oriented to  Time, Oriented to Situation Alcohol / Substance Use: Not Applicable Psych Involvement: No (comment)  Admission diagnosis:  Hypotension [I95.9] Hypotension due to hypovolemia [I95.89, E86.1] Patient Active Problem List   Diagnosis Date Noted   CKD (chronic kidney disease), stage IV ([place]) 10/25/2022   Hypotension due to hypovolemia 10/24/2022   Acute kidney injury ([place]) 10/24/2022   Hypotension 10/24/2022   Dehydration 10/23/2022   Syncope 10/23/2022   Fracture of distal end of right humerus 10/02/2022   GERD (gastroesophageal reflux disease) 09/28/2022   Right leg pain 12/27/2020   CKD (chronic kidney disease) stage 3, GFR 30-59 ml/min ([place]) 11/11/2020   Hypoalbuminemia 10/17/2020   Left lower lobe pneumonia 09/13/2020   Left rib fracture 09/13/2020   Hyponatremia 08/30/2020   Hypothyroidism 08/30/2020   Cerebellar infarct ([place]) 08/30/2020   Gait abnormality 08/30/2020   Osteoporosis 08/30/2020   Migraines 08/30/2020   HTN (hypertension) 08/30/2020   Bartholin gland cyst 08/30/2020   Left-sided chest wall pain 08/30/2020   Dry eyes 08/30/2020   Pressure ulcer, stage 1 08/28/2020   SAH (subarachnoid hemorrhage) ([place]) 08/25/2020   Subarachnoid hemorrhage ([place]) 08/24/2020   Fall 08/24/2020   Macrocytic anemia 08/24/2020   Bilateral leg edema 03/07/2020   Right shoulder pain 05/24/2017   Pancytopenia, acquired ([place])  02/12/2017   Other constipation 02/12/2017   Goals of care, counseling/discussion 01/24/2017   Lymphoma, marginal zone, lymph nodes of multiple sites ([place]) 12/24/2016   Anemia in neoplastic disease 12/24/2016   Trigeminal neuralgia 08/13/2016   Elevated total protein 06/26/2016   History of B-cell lymphoma 11/02/2015   PCP:  Virgie Dad, MD Pharmacy:   [id number] [address]Phone: [phone]: 3[place] [address] [place]Phone: [phone]: [phone]    Social Determinants of Health (SDOH) Social History: SDOH Screenings   Food Insecurity: No Food Insecurity (08/31/2022)  Housing: Low Risk  (05/03/2021)  Transportation Needs: No Transportation Needs (05/03/2021)  Alcohol Screen: Low Risk  (08/31/2022)  Depression (PHQ2-9): Low Risk  (10/22/2022)  Financial Resource Strain: Low Risk  (08/31/2022)  Physical Activity: Insufficiently Active (08/31/2022)  Social Connections: Socially Isolated (08/31/2022)  Stress: No Stress Concern Present (08/31/2022)  Tobacco Use: Medium Risk (10/24/2022)   SDOH Interventions:     Readmission Risk Interventions     No data to display

## 2022-10-25 NOTE — Progress Notes (Signed)
Left voicemail for Arley Phenix at [facility].  Gilmore Laroche, MSW, [facility]

## 2022-10-25 NOTE — H&P (Signed)
History and Physical    Patient: Joy Lynch BSW:[id number] DOB: 02/04/1926 DOA: 10/24/2022 DOS: the patient was seen and examined on 10/25/2022 PCP: Virgie Dad, MD  Patient coming from: SNF  Chief Complaint:  Chief Complaint  Patient presents with   Weakness   Hypotension   HPI: Joy Lynch is a 89 y.o. female with medical history significant of B-cell lymphoma, hypothyroidism, significant lymphedema, on chemotherapy was last chemo in December, history of pancytopenia from chemotherapy, cognitive communication deficit who is also DNR that is brought from skilled nursing facility secondary to weakness and hypotension.  Patient was apparently in the ER on January 16 for syncopal episode.  At that time she was seen and evaluated and returned to the skilled facility.  At the facility however she has continued to have generalized weakness and fatigue.  Also decreased oral intake.  Today she went to the oncology clinic for routine workup.  At that time her blood pressure was noted to be borderline low.  She was sent to the [place] emergency department for evaluation.  Patient apparently has not had chemotherapy since December but still weak.  At that point she had significant pancytopenia.  In the ED her systolic blood pressure was 73/48.  Heart rate was 130.  Initiated on IV fluids.  With her age and debility that was worrisome as to the cause.  PE was considered but patient has chronic kidney disease stage V so unable to get contrast.  She was subsequently sent over to the [facility] hospitalist service for further evaluation and treatment.   Review of Systems: As mentioned in the history of present illness. All other systems reviewed and are negative. 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[place])    Per [facility]   History of B-cell lymphoma 11/02/2015   History of falling    [place]   Hypothyroidism, unspecified    per [facility]   Lymphedema, not elsewhere classified    Per [facility]   Migraines    Muscle weakness (generalized)    [place]   Neuralgia and neuritis, unspecified    [place]   Nontraumatic subarachnoid hemorrhage, unspecified ([place])    per [facility]   Osteoporosis    Pain in right shoulder    Tachycardia, unspecified    [place]   Thyroid disease    Trigeminal neuralgia of left side of face    Unsteadiness on feet    [facility]   Past Surgical History:  Procedure Laterality Date   gamma knife     for trigeminal neuralgia   SHOULDER SURGERY     Right shoulder replacement   TONSILLECTOMY AND ADENOIDECTOMY     WISDOM TOOTH EXTRACTION     Social History:  reports that she quit smoking about 47 years ago. Her smoking use included cigarettes. She has a 10.00 pack-year smoking history. She has never used smokeless tobacco. She reports current alcohol use of about 2.0 standard drinks of alcohol per week. She reports that she does not use drugs.  Allergies  Allergen Reactions   Penicillins Hives   Tegretol [  Carbamazepine] Hives and Other (See Comments)    Turned purple from neck down and drowsy    Lyrica [Pregabalin] Other (See Comments)    Drowsy    Family History  Problem Relation Age of Onset   Cancer Father        unknown ca   Pneumonia Father    Cancer Brother        prostate ca   Stroke Brother    Heart attack Mother     Prior to Admission medications   Medication Sig Start Date End Date Taking? Authorizing Provider  acetaminophen (TYLENOL) 500 MG tablet Take 500 mg by mouth every 6 (six) hours as needed. Give 1 tablet by mouth three times a day for pain    [provider]  Amino Acids-Protein Hydrolys (PRO-STAT)  LIQD Take 30 mLs by mouth daily.    [provider]  aspirin 81 MG chewable tablet Chew 81 mg by mouth daily.    [provider]  Emollient (AQUAPHOR OINTMENT BODY EX) Apply topically. 536UY Give 1 application by mouth at bedtime for dry skin    [provider]  gabapentin (NEURONTIN) 600 MG tablet Take 600 mg by mouth 3 (three) times daily. 06/13/20   [provider]  lactose free nutrition (BOOST) LIQD Take 237 mLs by mouth daily.    [provider]  levothyroxine (SYNTHROID) 75 MCG tablet Take 75 mcg by mouth daily before breakfast.    [provider]  loperamide (IMODIUM A-D) 2 MG capsule Take 4 mg by mouth every 8 (eight) hours as needed for diarrhea or loose stools.    [provider]  methocarbamol (ROBAXIN) 500 MG tablet Take 0.5 tablets (250 mg total) by mouth 3 (three) times daily as needed for muscle spasms. 07/05/22   Fargo, Amy E, NP  Multiple Vitamin (MULTI-VITAMINS) TABS Take 1 tablet by mouth daily.    [provider]  pantoprazole (PROTONIX) 40 MG tablet Take 40 mg by mouth 2 (two) times daily.    [provider]  polyethylene glycol powder (MIRALAX) 17 GM/SCOOP powder Give 17 gram by mouth as needed for Bowel Management    [provider]  polyvinyl alcohol (LIQUIFILM TEARS) 1.4 % ophthalmic solution Place 1 drop into both eyes 4 (four) times daily as needed.    [provider]  simethicone (MYLICON) 403 MG chewable tablet Chew 125 mg by mouth 2 (two) times daily as needed for flatulence.    [provider]  traMADol (ULTRAM) 50 MG tablet Take 25 mg by mouth 3 (three) times daily as needed.    [provider]  vitamin B-12 (CYANOCOBALAMIN) 1000 MCG tablet Take 1 tablet (1,000 mcg total) by mouth daily. 05/19/21   Fargo, Amy E, NP  zinc oxide 20 % ointment Apply 1 application. topically as needed for irritation. To buttocks after every incontinent episode and as needed for  redness. May keep at bedside.    [provider]    Physical Exam: Vitals:   10/24/22 2000 10/24/22 2100 10/24/22 2200 10/24/22 2343  BP: (!) 141/88 136/73 133/70 130/72  Pulse: (!) 113 (!) 107 (!) 112 (!) 104  Resp: (!) 22 (!) '24 15 16  '$ Temp: 98.7 F (37.1 C)  98.7 F (37.1 C)   TempSrc: Oral  Oral Oral  SpO2: 92% 94% 92% 92%  Weight:       Constitutional: Frail, chronically ill looking, NAD, calm, comfortable Eyes: PERRL, lids and conjunctivae normal ENMT: Mucous membranes  are moist. Posterior pharynx clear of any exudate or lesions.Normal dentition.  Neck: normal, supple, no masses, no thyromegaly Respiratory: clear to auscultation bilaterally, no wheezing, no crackles. Normal respiratory effort. No accessory muscle use.  Cardiovascular: Sinus tachycardia, no murmurs / rubs / gallops. No extremity edema. 2+ pedal pulses. No carotid bruits.  Abdomen: no tenderness, no masses palpated. No hepatosplenomegaly. Bowel sounds positive.  Musculoskeletal: Good range of motion, no joint swelling or tenderness, Skin: no rashes, lesions, ulcers. No induration Neurologic: CN 2-12 grossly intact. Sensation intact, DTR normal. Strength 5/5 in all 4.  Psychiatric: Mildly confused, no agitation  Data Reviewed:  Temperature 98.8, initial blood pressure 73/48, pulse 130, respiratory rate of 24 and oxygen sats 91% on room air.  Sodium 136, potassium 4.1, chloride 108, CO2 18, glucose 118, BUN 71 creatinine 2.99.  Calcium 7.9.  Albumin 1.6.  Troponin 41 and 29.  GFR of 14.  Hemoglobin is 9.5 with a white count of 8.5 and platelets 286.  Assessment and Plan:  #1 hypotension: Appears to be due to dehydration.  Patient will be aggressively hydrated.  Blood pressure already improved.  Continue fluid resuscitation and monitoring.  #2 palpitations: Most likely as a result of dehydration.  Heart rate is improving.  Continue fluid resuscitation and chemotherapy.  No evidence of infection.  #3  history of marginal cell lymphoma: Defer to oncology.  #4 acute on chronic kidney disease stage IV: Hydrate and monitor renal function.  #5 hypothyroidism: Continue levothyroxine  #6 GERD: Continue PPIs  #7 essential hypertension: No medication at this point due to hypotension.     Advance Care Planning:   Code Status: DNR   Consults: None  Family Communication: No family at bedside. [id number]  Severity of Illness: The appropriate patient status for this patient is INPATIENT. Inpatient status is judged to be reasonable and necessary in order to provide the required intensity of service to ensure the patient's safety. The patient's presenting symptoms, physical exam findings, and initial radiographic and laboratory data in the context of their chronic comorbidities is felt to place them at high risk for further clinical deterioration. Furthermore, it is not anticipated that the patient will be medically stable for discharge from the hospital within 2 midnights of admission.   * I certify that at the point of admission it is my clinical judgment that the patient will require inpatient hospital care spanning beyond 2 midnights from the point of admission due to high intensity of service, high risk for further deterioration and high frequency of surveillance required.*  AuthorBarbette Merino, MD 10/25/2022 12:06 AM  For on call review [email or web address].

## 2022-10-25 NOTE — Progress Notes (Signed)
   10/25/22 0321  Assess: MEWS Score  Temp 97.6 F (36.4 C)  BP (!) 104/58  MAP (mmHg) 72  Pulse Rate (!) 113  ECG Heart Rate (!) 115  Resp 18  Level of Consciousness Alert  SpO2 94 %  O2 Device Room Air  Assess: MEWS Score  MEWS Temp 0  MEWS Systolic 0  MEWS Pulse 2  MEWS RR 0  MEWS LOC 0  MEWS Score 2  MEWS Score Color Yellow  Assess: if the MEWS score is Yellow or Red  Were vital signs taken at a resting state? Yes  Focused Assessment No change from prior assessment  Does the patient meet 2 or more of the SIRS criteria? No  MEWS guidelines implemented *See Row Information* Yes  Take Vital Signs  Increase Vital Sign Frequency  Yellow: Q 2hr X 2 then Q 4hr X 2, if remains yellow, continue Q 4hrs  Escalate  MEWS: Escalate Yellow: discuss with charge nurse/RN and consider discussing with provider and RRT  Notify: Charge Nurse/RN  Name of Charge Nurse/RN Notified Matt, RN  Date Charge Nurse/RN Notified 10/25/22  Time Charge Nurse/RN Notified [phone]  Document  Progress note created (see row info) Yes  Assess: SIRS CRITERIA  SIRS Temperature  0  SIRS Pulse 1  SIRS Respirations  0  SIRS WBC 0  SIRS Score Sum  1

## 2022-10-26 DIAGNOSIS — D630 Anemia in neoplastic disease: Secondary | ICD-10-CM | POA: Diagnosis not present

## 2022-10-26 DIAGNOSIS — E860 Dehydration: Secondary | ICD-10-CM | POA: Diagnosis not present

## 2022-10-26 DIAGNOSIS — N184 Chronic kidney disease, stage 4 (severe): Secondary | ICD-10-CM | POA: Diagnosis not present

## 2022-10-26 DIAGNOSIS — I9589 Other hypotension: Secondary | ICD-10-CM | POA: Diagnosis not present

## 2022-10-26 LAB — BASIC METABOLIC PANEL
Anion gap: 9 (ref 5–15)
BUN: 50 mg/dL — ABNORMAL HIGH (ref 8–23)
CO2: 20 mmol/L — ABNORMAL LOW (ref 22–32)
Calcium: 7.9 mg/dL — ABNORMAL LOW (ref 8.9–10.3)
Chloride: 106 mmol/L (ref 98–111)
Creatinine, Ser: 2.52 mg/dL — ABNORMAL HIGH (ref 0.44–1.00)
GFR, Estimated: 17 mL/min — ABNORMAL LOW (ref >60)
Glucose, Bld: 95 mg/dL (ref 70–99)
Potassium: 4.4 mmol/L (ref 3.5–5.1)
Sodium: 135 mmol/L (ref 135–145)

## 2022-10-26 NOTE — Plan of Care (Signed)
Patient being discharged to a skilled care facility

## 2022-10-26 NOTE — Discharge Summary (Signed)
PATIENT DETAILS Name: Trini Soldo Age: 89 y.o. Sex: female Date of Birth: 02/09/1926 MRN: [id number]. Admitting Physician: Rhetta Mura, DO QIW:[id number], Rene Kocher, MD  Admit Date: 10/24/2022 Discharge date: 10/26/2022  Recommendations for Outpatient Follow-up:  Follow up with PCP in 1-2 weeks Please obtain CMP/CBC in one week Palliative/hospice follow-up at SNF  Admitted From:  SNF  Disposition: Skilled nursing facility   Discharge Condition: fair  CODE STATUS:   Code Status: DNR   Diet recommendation:  Diet Order             Diet general           Diet regular Room service appropriate? Yes with Assist; Fluid consistency: Thin  Diet effective now                    Brief Summary: Patient is a 89 y.o.  female with marginal zone lymphoma, hypothyroidism who [facility] for failure to thrive syndrome/poor oral intake/weakness-she was found to have hypotension-and subsequently referred to the hospitalist service for further evaluation and treatment.   Significant events: 1/24>> to ED for evaluation of weakness-found to have hypotension-admit to [facility]   Significant studies: 1/24>> CXR: No PNA 1/24>> bilateral lower extremity Doppler: No DVT   Significant microbiology data: 1/24>> blood culture: No growth 1/24>> COVID/influenza/RSV PCR: Negative   Procedures: None   Consults: None  Brief Hospital Course: Hypotension Thought to be due to hypovolemia in the setting of poor oral intake No history for blood loss/GI loss Clinical scenario not consistent with sepsis BP much better following IV fluid hydration-IV fluids were stopped 1/25-she was monitored overnight-BP remained stable.   AKI on CKD stage IV AKI likely hemodynamically mediated in the setting of hypotension/dehydration Creatinine much better following IVF hydration-and back to close to baseline Avoid nephrotoxic agents Follow creatinine trend at PCPs discretion.   History  of marginal cell lymphoma Off all treatment since December Follow-up with Dr. Marin Olp postdischarge   Macrocytic anemia Likely due to underlying lymphoma Chronic issue Follow-up with oncology/hematology   Hypothyroidism Continue with levothyroxine  Nutrition Status: Nutrition Problem: Moderate Malnutrition Etiology: chronic illness Signs/Symptoms: severe muscle depletion, moderate fat depletion Interventions: MVI, Liberalize Diet   Frailty Failure to thrive syndrome Difficult situation-seems to be slowly deteriorating over the past several months Will need palliative care follow-up at SNF DNR in place   Underweight: Estimated body mass index is 15.33 kg/m as calculated from the following:   Height as of an earlier encounter on 10/24/22: '5\' 6"'$  (1.676 m).   Weight as of this encounter: 43.1 kg.   RN pressure injury documentation: Pressure Injury 08/27/20 Buttocks Left;Medial Stage 1 -  Intact skin with non-blanchable redness of a localized area usually over a bony prominence. (Active)  08/27/20 2355  Location: Buttocks  Location Orientation: Left;Medial  Staging: Stage 1 -  Intact skin with non-blanchable redness of a localized area usually over a bony prominence.  Wound Description (Comments):   Present on Admission: Yes (present on transfer)    Discharge Diagnoses:  Principal Problem:   Hypotension Active Problems:   Lymphoma, marginal zone, lymph nodes of multiple sites (HCC)   Anemia in neoplastic disease   Hypothyroidism   GERD (gastroesophageal reflux disease)   Dehydration   CKD (chronic kidney disease), stage IV [facility])   Discharge Instructions:  Activity:  As tolerated with Full fall precautions use walker/cane & assistance as needed  Discharge Instructions     Call MD for:  difficulty breathing, headache or visual disturbances   Complete by: As directed    Call MD for:  extreme fatigue   Complete by: As directed    Call MD for:  persistant dizziness  or light-headedness   Complete by: As directed    Diet general   Complete by: As directed    Discharge instructions   Complete by: As directed    Follow with Primary MD  Virgie Dad, MD in 1-2 weeks  Please get a complete blood count and chemistry panel checked by your Primary MD at your next visit, and again as instructed by your Primary MD.  Get Medicines reviewed and adjusted: Please take all your medications with you for your next visit with your Primary MD  Laboratory/radiological data: Please request your Primary MD to go over all hospital tests and procedure/radiological results at the follow up, please ask your Primary MD to get all Hospital records sent to his/her office.  In some cases, they will be blood work, cultures and biopsy results pending at the time of your discharge. Please request that your primary care M.D. follows up on these results.  Also Note the following: If you experience worsening of your admission symptoms, develop shortness of breath, life threatening emergency, suicidal or homicidal thoughts you must seek medical attention immediately by calling 911 or calling your MD immediately  if symptoms less severe.  You must read complete instructions/literature along with all the possible adverse reactions/side effects for all the Medicines you take and that have been prescribed to you. Take any new Medicines after you have completely understood and accpet all the possible adverse reactions/side effects.   Do not drive when taking Pain medications or sleeping medications (Benzodaizepines)  Do not take more than prescribed Pain, Sleep and Anxiety Medications. It is not advisable to combine anxiety,sleep and pain medications without talking with your primary care practitioner  Special Instructions: If you have smoked or chewed Tobacco  in the last 2 yrs please stop smoking, stop any regular Alcohol  and or any Recreational drug use.  Wear Seat belts while  driving.  Please note: You were cared for by a hospitalist during your hospital stay. Once you are discharged, your primary care physician will handle any further medical issues. Please note that NO REFILLS for any discharge medications will be authorized once you are discharged, as it is imperative that you return to your primary care physician (or establish a relationship with a primary care physician if you do not have one) for your post hospital discharge needs so that they can reassess your need for medications and monitor your lab values.   Increase activity slowly   Complete by: As directed       Allergies as of 10/26/2022       Reactions   Penicillins Hives   Tegretol [carbamazepine] Hives, Other (See Comments)   Turned purple from neck down and drowsy   Lyrica [pregabalin] Other (See Comments)   Drowsy        Medication List     STOP taking these medications    traMADol 50 MG tablet Commonly known as: ULTRAM       TAKE these medications    acetaminophen 500 MG tablet Commonly known as: TYLENOL Take 500 mg by mouth every 8 (eight) hours as needed for mild pain.   acetaminophen 500 MG tablet Commonly known as: TYLENOL Take 500 mg by mouth 3 (three) times daily.   AQUAPHOR OINTMENT BODY EX  Apply 1 Application topically at bedtime. for dry skin   aspirin 81 MG chewable tablet Chew 81 mg by mouth daily.   cyanocobalamin 1000 MCG tablet Commonly known as: VITAMIN B12 Take 1 tablet (1,000 mcg total) by mouth daily.   feeding supplement (PRO-STAT SUGAR FREE 64) Liqd Take 30 mLs by mouth daily.   gabapentin 600 MG tablet Commonly known as: NEURONTIN Take 600 mg by mouth 3 (three) times daily.   Imodium A-D 2 MG capsule Generic drug: loperamide Take 4 mg by mouth every 8 (eight) hours as needed for diarrhea or loose stools.   lactose free nutrition Liqd Take 237 mLs by mouth 2 (two) times daily between meals. Or Nutrition supplement of choice (  Chocolate)   levothyroxine 75 MCG tablet Commonly known as: SYNTHROID Take 75 mcg by mouth daily before breakfast.   methocarbamol 500 MG tablet Commonly known as: ROBAXIN Take 0.5 tablets (250 mg total) by mouth 3 (three) times daily as needed for muscle spasms.   MiraLax 17 GM/SCOOP powder Generic drug: polyethylene glycol powder Give 17 gram by mouth as needed for Bowel Management   Multi-Vitamins Tabs Take 1 tablet by mouth daily.   pantoprazole 40 MG tablet Commonly known as: PROTONIX Take 40 mg by mouth 2 (two) times daily.   polyvinyl alcohol 1.4 % ophthalmic solution Commonly known as: LIQUIFILM TEARS Place 1 drop into both eyes 4 (four) times daily as needed for dry eyes.   simethicone 125 MG chewable tablet Commonly known as: MYLICON Chew 941 mg by mouth daily as needed for flatulence.   zinc oxide 20 % ointment Apply 1 application. topically as needed for irritation. To buttocks after every incontinent episode and as needed for redness. May keep at bedside.        Follow-up Information     Virgie Dad, MD. Schedule an appointment as soon as possible for a visit in 1 week(s).   Specialty: Internal Medicine Contact information: [place] [address] [phone]         Volanda Napoleon, MD Follow up.   Specialty: Oncology Why: as previously scheduled Contact information: [address] [place] [address] [phone]                Allergies  Allergen Reactions   Penicillins Hives   Tegretol [Carbamazepine] Hives and Other (See Comments)    Turned purple from neck down and drowsy    Lyrica [Pregabalin] Other (See Comments)    Drowsy     Other Procedures/Studies: US Venous Img Lower Bilateral  Result Date: 10/24/2022 CLINICAL DATA:  Short of breath, evaluate for deep venous thrombosis EXAM: Bilateral LOWER EXTREMITY VENOUS DOPPLER ULTRASOUND TECHNIQUE: Gray-scale sonography with  compression, as well as color and duplex ultrasound, were performed to evaluate the deep venous system(s) from the level of the common femoral vein through the popliteal and proximal calf veins. COMPARISON:  None Available. FINDINGS: VENOUS Normal compressibility of the common femoral, superficial femoral, and popliteal veins, as well as the visualized calf veins. Visualized portions of profunda femoral vein and great saphenous vein unremarkable. No filling defects to suggest DVT on grayscale or color Doppler imaging. Doppler waveforms show normal direction of venous flow, normal respiratory plasticity and response to augmentation. Limited views of the contralateral common femoral vein are unremarkable. OTHER None. Limitations: none IMPRESSION: No evidence of deep venous thrombosis within LEFT or RIGHT lower extremity. Electronically Signed   By: Nicole Kindred  Leonia Reeves M.D.   On: 10/24/2022 18:33   DG Chest Port 1 View  Result Date: 10/24/2022 CLINICAL DATA:  Sepsis EXAM: PORTABLE CHEST 1 VIEW COMPARISON:  10/16/2022 x-ray FINDINGS: Slightly elevated right hemidiaphragm. No consolidation, pneumothorax or effusion. Normal cardiopericardial silhouette without edema. Overlapping cardiac leads. Right shoulder arthroplasty. Film is slightly rotated. Old rib fractures. IMPRESSION: No active disease.  No acute cardiopulmonary disease. Electronically Signed   By: Jill Side M.D.   On: 10/24/2022 15:59   CT Head Wo Contrast  Result Date: 10/16/2022 CLINICAL DATA:  Syncopal episode. EXAM: CT HEAD WITHOUT CONTRAST TECHNIQUE: Contiguous axial images were obtained from the base of the skull through the vertex without intravenous contrast. RADIATION DOSE REDUCTION: This exam was performed according to the departmental dose-optimization program which includes automated exposure control, adjustment of the mA and/or kV according to patient size and/or use of iterative reconstruction technique. COMPARISON:  Head CT dated  09/28/2022. FINDINGS: Brain: Generalized age related parenchymal volume loss with commensurate dilatation of the ventricles and sulci. Mild chronic small vessel ischemic changes within the bilateral periventricular and subcortical white matter regions. No mass, hemorrhage, edema or other evidence of acute parenchymal abnormality. No extra-axial hemorrhage. Vascular: Chronic calcified atherosclerotic changes of the large vessels at the skull base. No unexpected hyperdense vessel. Skull: Normal. Negative for fracture or focal lesion. Sinuses/Orbits: No acute finding. Other: None. IMPRESSION: 1. No acute findings. No intracranial mass, hemorrhage or edema. 2. Mild chronic small vessel ischemic changes in the white matter. Electronically Signed   By: Franki Cabot M.D.   On: 10/16/2022 13:55   DG Chest 2 View  Result Date: 10/16/2022 CLINICAL DATA:  Syncope. EXAM: CHEST - 2 VIEW COMPARISON:  CT chest dated October 19, 2020. Chest x-ray dated August 24, 2020. FINDINGS: The heart size and mediastinal contours are within normal limits. Normal pulmonary vascularity. No focal consolidation, pleural effusion, or pneumothorax. No acute osseous abnormality. Old bilateral rib and sternal fractures again noted. IMPRESSION: No active cardiopulmonary disease. Electronically Signed   By: Titus Dubin M.D.   On: 10/16/2022 12:19   DG Humerus Right  Result Date: 09/28/2022 CLINICAL DATA:  fall, obvious deformity EXAM: RIGHT ELBOW - COMPLETE 3+ VIEW; RIGHT HUMERUS - 2+ VIEW COMPARISON:  X-ray right shoulder 09/03/2016 FINDINGS: Right humerus: Acute comminuted and displaced spiral fracture of the distal humeral shaft distal to a total right shoulder arthroplasty. No radiographic findings suggest surgical hardware complication. No periprosthetic lucency. Right elbow: No evidence of fracture, dislocation, or joint effusion. No evidence of severe arthropathy. No aggressive appearing focal bone abnormality. Soft tissues are  unremarkable. IMPRESSION: 1. Acute comminuted and displaced spiral fracture of the distal humeral shaft distal to a total right shoulder arthroplasty. 2. No acute displaced fracture or dislocation of the bones of the right elbow. Electronically Signed   By: Iven Finn M.D.   On: 09/28/2022 17:50   DG Elbow Complete Right  Result Date: 09/28/2022 CLINICAL DATA:  fall, obvious deformity EXAM: RIGHT ELBOW - COMPLETE 3+ VIEW; RIGHT HUMERUS - 2+ VIEW COMPARISON:  X-ray right shoulder 09/03/2016 FINDINGS: Right humerus: Acute comminuted and displaced spiral fracture of the distal humeral shaft distal to a total right shoulder arthroplasty. No radiographic findings suggest surgical hardware complication. No periprosthetic lucency. Right elbow: No evidence of fracture, dislocation, or joint effusion. No evidence of severe arthropathy. No aggressive appearing focal bone abnormality. Soft tissues are unremarkable. IMPRESSION: 1. Acute comminuted and displaced spiral fracture of the distal humeral shaft distal  to a total right shoulder arthroplasty. 2. No acute displaced fracture or dislocation of the bones of the right elbow. Electronically Signed   By: Iven Finn M.D.   On: 09/28/2022 17:50   CT Head Wo Contrast  Result Date: 09/28/2022 CLINICAL DATA:  Head trauma, moderate-severe; Neck trauma (Age >= 65y) EXAM: CT HEAD WITHOUT CONTRAST CT CERVICAL SPINE WITHOUT CONTRAST TECHNIQUE: Multidetector CT imaging of the head and cervical spine was performed following the standard protocol without intravenous contrast. Multiplanar CT image reconstructions of the cervical spine were also generated. RADIATION DOSE REDUCTION: This exam was performed according to the departmental dose-optimization program which includes automated exposure control, adjustment of the mA and/or kV according to patient size and/or use of iterative reconstruction technique. COMPARISON:  None Available. FINDINGS: CT HEAD FINDINGS Brain:  Cerebral ventricle sizes are concordant with the degree of cerebral volume loss. Patchy and confluent areas of decreased attenuation are noted throughout the deep and periventricular white matter of the cerebral hemispheres bilaterally, compatible with chronic microvascular ischemic disease. No evidence of large-territorial acute infarction. No parenchymal hemorrhage. No mass lesion. No extra-axial collection. No mass effect or midline shift. No hydrocephalus. Basilar cisterns are patent. Vascular: No hyperdense vessel. Skull: No acute fracture or focal lesion. Sinuses/Orbits: Paranasal sinuses and mastoid air cells are clear. Bilateral lens replacement. Otherwise the orbits are unremarkable. Other: 3 mm right frontal scalp hematoma formation. CT CERVICAL SPINE FINDINGS Alignment: Reversal normal cervical lordosis at the C6 level likely due to positioning and degenerative changes. Grade 1 anterolisthesis C2 on C3, C3 on C4, C4 on C5, C5 on C6. Mild retrolisthesis of C6 on C7. Skull base and vertebrae: Multilevel severe degenerative changes most prominent at the C6-C7 level. Associated multilevel moderate to severe osseous neural foraminal stenosis. No associated severe osseous central canal stenosis. No acute fracture. No aggressive appearing focal osseous lesion or focal pathologic process. Soft tissues and spinal canal: No prevertebral fluid or swelling. No visible canal hematoma. Upper chest: Biapical pleural/pulmonary scarring. Other: Atherosclerotic plaque of the carotid arteries within the neck. IMPRESSION: 1. No acute intracranial abnormality. 2. No acute displaced fracture or traumatic listhesis of the cervical spine. Electronically Signed   By: Iven Finn M.D.   On: 09/28/2022 17:42   CT Cervical Spine Wo Contrast  Result Date: 09/28/2022 CLINICAL DATA:  Head trauma, moderate-severe; Neck trauma (Age >= 65y) EXAM: CT HEAD WITHOUT CONTRAST CT CERVICAL SPINE WITHOUT CONTRAST TECHNIQUE: Multidetector  CT imaging of the head and cervical spine was performed following the standard protocol without intravenous contrast. Multiplanar CT image reconstructions of the cervical spine were also generated. RADIATION DOSE REDUCTION: This exam was performed according to the departmental dose-optimization program which includes automated exposure control, adjustment of the mA and/or kV according to patient size and/or use of iterative reconstruction technique. COMPARISON:  None Available. FINDINGS: CT HEAD FINDINGS Brain: Cerebral ventricle sizes are concordant with the degree of cerebral volume loss. Patchy and confluent areas of decreased attenuation are noted throughout the deep and periventricular white matter of the cerebral hemispheres bilaterally, compatible with chronic microvascular ischemic disease. No evidence of large-territorial acute infarction. No parenchymal hemorrhage. No mass lesion. No extra-axial collection. No mass effect or midline shift. No hydrocephalus. Basilar cisterns are patent. Vascular: No hyperdense vessel. Skull: No acute fracture or focal lesion. Sinuses/Orbits: Paranasal sinuses and mastoid air cells are clear. Bilateral lens replacement. Otherwise the orbits are unremarkable. Other: 3 mm right frontal scalp hematoma formation. CT CERVICAL SPINE FINDINGS Alignment: Reversal  normal cervical lordosis at the C6 level likely due to positioning and degenerative changes. Grade 1 anterolisthesis C2 on C3, C3 on C4, C4 on C5, C5 on C6. Mild retrolisthesis of C6 on C7. Skull base and vertebrae: Multilevel severe degenerative changes most prominent at the C6-C7 level. Associated multilevel moderate to severe osseous neural foraminal stenosis. No associated severe osseous central canal stenosis. No acute fracture. No aggressive appearing focal osseous lesion or focal pathologic process. Soft tissues and spinal canal: No prevertebral fluid or swelling. No visible canal hematoma. Upper chest: Biapical  pleural/pulmonary scarring. Other: Atherosclerotic plaque of the carotid arteries within the neck. IMPRESSION: 1. No acute intracranial abnormality. 2. No acute displaced fracture or traumatic listhesis of the cervical spine. Electronically Signed   By: Iven Finn M.D.   On: 09/28/2022 17:42     TODAY-DAY OF DISCHARGE:  Subjective:   Otilio Miu today has no headache,no chest abdominal pain,no new weakness tingling or numbness, feels much better wants to go home today.   Objective:   Blood pressure 114/64, pulse (!) 107, temperature 98.6 F (37 C), temperature source Oral, resp. rate 18, weight 43.1 kg, SpO2 95 %.  Intake/Output Summary (Last 24 hours) at 10/26/2022 0917 Last data filed at 10/26/2022 0533 Gross per 24 hour  Intake 220 ml  Output 200 ml  Net 20 ml   Filed Weights   10/24/22 1523  Weight: 43.1 kg    Exam: Awake Alert, Oriented *3, No new F.N deficits, Normal affect [place].AT,PERRAL Supple Neck,No JVD, No cervical lymphadenopathy appriciated.  Symmetrical Chest wall movement, Good air movement bilaterally, CTAB RRR,No Gallops,Rubs or new Murmurs, No Parasternal Heave +ve B.Sounds, Abd Soft, Non tender, No organomegaly appriciated, No rebound -guarding or rigidity. No Cyanosis, Clubbing or edema, No new Rash or bruise   PERTINENT RADIOLOGIC STUDIES: US Venous Img Lower Bilateral  Result Date: 10/24/2022 CLINICAL DATA:  Short of breath, evaluate for deep venous thrombosis EXAM: Bilateral LOWER EXTREMITY VENOUS DOPPLER ULTRASOUND TECHNIQUE: Gray-scale sonography with compression, as well as color and duplex ultrasound, were performed to evaluate the deep venous system(s) from the level of the common femoral vein through the popliteal and proximal calf veins. COMPARISON:  None Available. FINDINGS: VENOUS Normal compressibility of the common femoral, superficial femoral, and popliteal veins, as well as the visualized calf veins. Visualized portions of profunda femoral  vein and great saphenous vein unremarkable. No filling defects to suggest DVT on grayscale or color Doppler imaging. Doppler waveforms show normal direction of venous flow, normal respiratory plasticity and response to augmentation. Limited views of the contralateral common femoral vein are unremarkable. OTHER None. Limitations: none IMPRESSION: No evidence of deep venous thrombosis within LEFT or RIGHT lower extremity. Electronically Signed   By: Suzy Bouchard M.D.   On: 10/24/2022 18:33   DG Chest Port 1 View  Result Date: 10/24/2022 CLINICAL DATA:  Sepsis EXAM: PORTABLE CHEST 1 VIEW COMPARISON:  10/16/2022 x-ray FINDINGS: Slightly elevated right hemidiaphragm. No consolidation, pneumothorax or effusion. Normal cardiopericardial silhouette without edema. Overlapping cardiac leads. Right shoulder arthroplasty. Film is slightly rotated. Old rib fractures. IMPRESSION: No active disease.  No acute cardiopulmonary disease. Electronically Signed   By: Jill Side M.D.   On: 10/24/2022 15:59     PERTINENT LAB RESULTS: CBC: Recent Labs    10/24/22 1602 10/25/22 0800  WBC 8.5 7.6  HGB 9.5* 10.0*  HCT 29.7* 31.5*  PLT 286 247   CMET CMP     Component Value Date/Time  NA 135 10/26/2022 0701   NA 135 (A) 10/12/2022 1200   NA 138 08/16/2017 1256   K 4.4 10/26/2022 0701   K 4.3 08/16/2017 1256   CL 106 10/26/2022 0701   CO2 20 (L) 10/26/2022 0701   CO2 25 08/16/2017 1256   GLUCOSE 95 10/26/2022 0701   GLUCOSE 88 08/16/2017 1256   BUN 50 (H) 10/26/2022 0701   BUN 80 (A) 10/12/2022 1200   BUN 19.6 08/16/2017 1256   CREATININE 2.52 (H) 10/26/2022 0701   CREATININE 3.17 (HH) 10/24/2022 1409   CREATININE 0.7 08/16/2017 1256   CALCIUM 7.9 (L) 10/26/2022 0701   CALCIUM 9.2 08/16/2017 1256   PROT 6.3 (L) 10/25/2022 0800   PROT 7.5 08/16/2017 1256   ALBUMIN <1.5 (L) 10/25/2022 0800   ALBUMIN 3.6 08/16/2017 1256   AST 18 10/25/2022 0800   AST 12 (L) 10/24/2022 1409   AST 18 08/16/2017  1256   ALT 10 10/25/2022 0800   ALT 6 10/24/2022 1409   ALT 11 08/16/2017 1256   ALKPHOS 93 10/25/2022 0800   ALKPHOS 42 08/16/2017 1256   BILITOT 0.5 10/25/2022 0800   BILITOT 0.2 (L) 10/24/2022 1409   BILITOT 0.87 08/16/2017 1256   GFRNONAA 17 (L) 10/26/2022 0701   GFRNONAA 13 (L) 10/24/2022 1409   GFRAA >60 06/27/2020 1025    GFR Estimated Creatinine Clearance: 9.3 mL/min (A) (by C-G formula based on SCr of 2.52 mg/dL (H)). No results for input(s): "LIPASE", "AMYLASE" in the last 72 hours. No results for input(s): "CKTOTAL", "CKMB", "CKMBINDEX", "TROPONINI" in the last 72 hours. Invalid input(s): "POCBNP" No results for input(s): "DDIMER" in the last 72 hours. No results for input(s): "HGBA1C" in the last 72 hours. No results for input(s): "CHOL", "HDL", "LDLCALC", "TRIG", "CHOLHDL", "LDLDIRECT" in the last 72 hours. No results for input(s): "TSH", "T4TOTAL", "T3FREE", "THYROIDAB" in the last 72 hours.  Invalid input(s): "FREET3" No results for input(s): "VITAMINB12", "FOLATE", "FERRITIN", "TIBC", "IRON", "RETICCTPCT" in the last 72 hours. Coags: Recent Labs    10/24/22 1602  INR 1.0   Microbiology: Recent Results (from the past 240 hour(s))  Resp panel by RT-PCR (RSV, Flu A&B, Covid) Anterior Nasal Swab     Status: None   Collection Time: 10/16/22 10:42 AM   Specimen: Anterior Nasal Swab  Result Value Ref Range Status   SARS Coronavirus 2 by RT PCR NEGATIVE NEGATIVE Final    Comment: (NOTE) SARS-CoV-2 target nucleic acids are NOT DETECTED.  The SARS-CoV-2 RNA is generally detectable in upper respiratory specimens during the acute phase of infection. The lowest concentration of SARS-CoV-2 viral copies this assay can detect is 138 copies/mL. A negative result does not preclude SARS-Cov-2 infection and should not be used as the sole basis for treatment or other patient management decisions. A negative result may occur with  improper specimen collection/handling,  submission of specimen other than nasopharyngeal swab, presence of viral mutation(s) within the areas targeted by this assay, and inadequate number of viral copies(<138 copies/mL). A negative result must be combined with clinical observations, patient history, and epidemiological information. The expected result is Negative.  Fact Sheet for Patients:  EntrepreneurPulse.com.au  Fact Sheet for Healthcare Providers:  IncredibleEmployment.be  This test is no t yet approved or cleared by the [place] FDA and  has been authorized for detection and/or diagnosis of SARS-CoV-2 by FDA under an Emergency Use Authorization (EUA). This EUA will remain  in effect (meaning this test can be used) for the duration of the  COVID-19 declaration under Section 564(b)(1) of the Act, 21 U.S.C.section 360bbb-3(b)(1), unless the authorization is terminated  or revoked sooner.       Influenza A by PCR NEGATIVE NEGATIVE Final   Influenza B by PCR NEGATIVE NEGATIVE Final    Comment: (NOTE) The Xpert Xpress SARS-CoV-2/FLU/RSV plus assay is intended as an aid in the diagnosis of influenza from Nasopharyngeal swab specimens and should not be used as a sole basis for treatment. Nasal washings and aspirates are unacceptable for Xpert Xpress SARS-CoV-2/FLU/RSV testing.  Fact Sheet for Patients: EntrepreneurPulse.com.au  Fact Sheet for Healthcare Providers: IncredibleEmployment.be  This test is not yet approved or cleared by the [place] FDA and has been authorized for detection and/or diagnosis of SARS-CoV-2 by FDA under an Emergency Use Authorization (EUA). This EUA will remain in effect (meaning this test can be used) for the duration of the COVID-19 declaration under Section 564(b)(1) of the Act, 21 U.S.C. section 360bbb-3(b)(1), unless the authorization is terminated or revoked.     Resp Syncytial Virus by PCR NEGATIVE  NEGATIVE Final    Comment: (NOTE) Fact Sheet for Patients: EntrepreneurPulse.com.au  Fact Sheet for Healthcare Providers: IncredibleEmployment.be  This test is not yet approved or cleared by the [place] FDA and has been authorized for detection and/or diagnosis of SARS-CoV-2 by FDA under an Emergency Use Authorization (EUA). This EUA will remain in effect (meaning this test can be used) for the duration of the COVID-19 declaration under Section 564(b)(1) of the Act, 21 U.S.C. section 360bbb-3(b)(1), unless the authorization is terminated or revoked.  Performed at [facility], [place] [address] [place], [place] [address]   Blood Culture (routine x 2)     Status: None (Preliminary result)   Collection Time: 10/24/22  4:00 PM   Specimen: BLOOD  Result Value Ref Range Status   Specimen Description   Final    BLOOD LEFT ANTECUBITAL Performed at [facility], [address] [place], [place] [address]    Special Requests   Final    BOTTLES DRAWN AEROBIC AND ANAEROBIC Blood Culture adequate volume Performed at [facility], [address] [place], [place] [address]    Culture   Final    NO GROWTH < 12 HOURS Performed at [facility], [place] [address], [place], [place] [address]    Report Status PENDING  Incomplete  Blood Culture (routine x 2)     Status: None (Preliminary result)   Collection Time: 10/24/22  4:02 PM   Specimen: BLOOD  Result Value Ref Range Status   Specimen Description   Final    BLOOD BLOOD LEFT HAND Performed at [facility], [address] [place], [place] [address]    Special Requests   Final    BOTTLES DRAWN AEROBIC AND ANAEROBIC Blood Culture adequate volume Performed at [facility], [address] [place], [place] [address]    Culture   Final    NO GROWTH < 12 HOURS Performed at [facility], [place] [address], [place], [place]  [address]    Report Status PENDING  Incomplete  Resp panel by RT-PCR (RSV, Flu A&B, Covid) Peripheral     Status: None   Collection Time: 10/24/22  4:02 PM   Specimen: Peripheral; Nasal Swab  Result Value Ref Range Status   SARS Coronavirus 2 by RT PCR NEGATIVE NEGATIVE Final    Comment: (NOTE) SARS-CoV-2 target nucleic acids are  NOT DETECTED.  The SARS-CoV-2 RNA is generally detectable in upper respiratory specimens during the acute phase of infection. The lowest concentration of SARS-CoV-2 viral copies this assay can detect is 138 copies/mL. A negative result does not preclude SARS-Cov-2 infection and should not be used as the sole basis for treatment or other patient management decisions. A negative result may occur with  improper specimen collection/handling, submission of specimen other than nasopharyngeal swab, presence of viral mutation(s) within the areas targeted by this assay, and inadequate number of viral copies(<138 copies/mL). A negative result must be combined with clinical observations, patient history, and epidemiological information. The expected result is Negative.  Fact Sheet for Patients:  EntrepreneurPulse.com.au  Fact Sheet for Healthcare Providers:  IncredibleEmployment.be  This test is no t yet approved or cleared by the [place] FDA and  has been authorized for detection and/or diagnosis of SARS-CoV-2 by FDA under an Emergency Use Authorization (EUA). This EUA will remain  in effect (meaning this test can be used) for the duration of the COVID-19 declaration under Section 564(b)(1) of the Act, 21 U.S.C.section 360bbb-3(b)(1), unless the authorization is terminated  or revoked sooner.       Influenza A by PCR NEGATIVE NEGATIVE Final   Influenza B by PCR NEGATIVE NEGATIVE Final    Comment: (NOTE) The Xpert Xpress SARS-CoV-2/FLU/RSV plus assay is intended as an aid in the diagnosis of influenza from Nasopharyngeal  swab specimens and should not be used as a sole basis for treatment. Nasal washings and aspirates are unacceptable for Xpert Xpress SARS-CoV-2/FLU/RSV testing.  Fact Sheet for Patients: EntrepreneurPulse.com.au  Fact Sheet for Healthcare Providers: IncredibleEmployment.be  This test is not yet approved or cleared by the [place] FDA and has been authorized for detection and/or diagnosis of SARS-CoV-2 by FDA under an Emergency Use Authorization (EUA). This EUA will remain in effect (meaning this test can be used) for the duration of the COVID-19 declaration under Section 564(b)(1) of the Act, 21 U.S.C. section 360bbb-3(b)(1), unless the authorization is terminated or revoked.     Resp Syncytial Virus by PCR NEGATIVE NEGATIVE Final    Comment: (NOTE) Fact Sheet for Patients: EntrepreneurPulse.com.au  Fact Sheet for Healthcare Providers: IncredibleEmployment.be  This test is not yet approved or cleared by the [place] FDA and has been authorized for detection and/or diagnosis of SARS-CoV-2 by FDA under an Emergency Use Authorization (EUA). This EUA will remain in effect (meaning this test can be used) for the duration of the COVID-19 declaration under Section 564(b)(1) of the Act, 21 U.S.C. section 360bbb-3(b)(1), unless the authorization is terminated or revoked.  Performed at [facility], [address] [place], [place] [address]     FURTHER DISCHARGE INSTRUCTIONS:  Get Medicines reviewed and adjusted: Please take all your medications with you for your next visit with your Primary MD  Laboratory/radiological data: Please request your Primary MD to go over all hospital tests and procedure/radiological results at the follow up, please ask your Primary MD to get all Hospital records sent to his/her office.  In some cases, they will be blood work, cultures and biopsy results  pending at the time of your discharge. Please request that your primary care M.D. goes through all the records of your hospital data and follows up on these results.  Also Note the following: If you experience worsening of your admission symptoms, develop shortness of breath, life threatening emergency, suicidal or homicidal thoughts you must seek medical attention immediately by calling 911  or calling your MD immediately  if symptoms less severe.  You must read complete instructions/literature along with all the possible adverse reactions/side effects for all the Medicines you take and that have been prescribed to you. Take any new Medicines after you have completely understood and accpet all the possible adverse reactions/side effects.   Do not drive when taking Pain medications or sleeping medications (Benzodaizepines)  Do not take more than prescribed Pain, Sleep and Anxiety Medications. It is not advisable to combine anxiety,sleep and pain medications without talking with your primary care practitioner  Special Instructions: If you have smoked or chewed Tobacco  in the last 2 yrs please stop smoking, stop any regular Alcohol  and or any Recreational drug use.  Wear Seat belts while driving.  Please note: You were cared for by a hospitalist during your hospital stay. Once you are discharged, your primary care physician will handle any further medical issues. Please note that NO REFILLS for any discharge medications will be authorized once you are discharged, as it is imperative that you return to your primary care physician (or establish a relationship with a primary care physician if you do not have one) for your post hospital discharge needs so that they can reassess your need for medications and monitor your lab values.  Total Time spent coordinating discharge including counseling, education and face to face time equals greater than 30 minutes.  SignedOren Binet 10/26/2022 9:17  AM

## 2022-10-26 NOTE — TOC Progression Note (Signed)
Transition of Care [facility] - Progression Note    Patient Details  Name: Joy Lynch MRN: [id number] Date of Birth: 01/23/1926  Transition of Care [facility]) CM/SW [place], LCSW Phone Number: 10/26/2022, 9:29 AM  Clinical Narrative:    CSW updated patient's POA [name] and he reported agreement with [facility] for transport as [place] does not provide transport.    Expected Discharge Plan: [place] Barriers to Discharge: Barriers Resolved  Expected Discharge Plan and Services In-house Referral: Clinical Social Work   Post Acute Care Choice: Nursing Home Living arrangements for the past 2 months: [place] Expected Discharge Date: 10/26/22                                     Social Determinants of Health (SDOH) Interventions SDOH Screenings   Food Insecurity: No Food Insecurity (08/31/2022)  Housing: Low Risk  (05/03/2021)  Transportation Needs: No Transportation Needs (05/03/2021)  Alcohol Screen: Low Risk  (08/31/2022)  Depression (PHQ2-9): Low Risk  (10/22/2022)  Financial Resource Strain: Low Risk  (08/31/2022)  Physical Activity: Insufficiently Active (08/31/2022)  Social Connections: Socially Isolated (08/31/2022)  Stress: No Stress Concern Present (08/31/2022)  Tobacco Use: Medium Risk (10/24/2022)    Readmission Risk Interventions     No data to display

## 2022-10-26 NOTE — Plan of Care (Signed)
  Problem: Health Behavior/Discharge Planning: Goal: Ability to manage health-related needs will improve Outcome: Progressing   Problem: Nutrition: Goal: Adequate nutrition will be maintained Outcome: Progressing   Problem: Pain Managment: Goal: General experience of comfort will improve Outcome: Progressing   

## 2022-10-26 NOTE — TOC Transition Note (Signed)
Transition of Care [name]) - CM/SW Discharge Note   Patient Details  Name: Joy Lynch MRN: [id number] Date of Birth: [birth year]  [name] CM/SW Contact:  Benard Halsted, LCSW Phone Number: 10/26/2022, 12:07 PM   Clinical Narrative:    Patient will DC to: [name] Anticipated DC date: 10/26/22 Family notified: [name]) Transport by: [name]   Per MD patient ready for DC to [name]. RN to call report prior to discharge [phone] ext [phone] (if that doesn't work then try ext [phone]) room 16 ). RN, patient, patient's family, and facility notified of DC. Discharge Summary and FL2 sent to facility. DC packet on chart with signed DNR. Ambulance transport requested for patient.   CSW will sign off for now as social work intervention is no longer needed. Please consult [place] again if new needs arise.     Final next level of care: Skilled Nursing Facility Barriers to Discharge: Barriers Resolved   Patient Goals and CMS Choice CMS Medicare.gov Compare Post Acute Care list provided to:: Patient Choice offered to / list presented to : Patient  Discharge Placement     Existing PASRR number confirmed : 10/26/22          Patient [facility] bed at: [name] Patient to be transferred to facility by: [name] Name of family member notified: [name] Patient and family notified of of transfer: 10/26/22  Discharge Plan and Services Additional resources added to the After Visit Summary for   In-house Referral: Clinical Social Work   Post Acute Care Choice: Nursing Home                               Social Determinants of Health (SDOH) Interventions SDOH Screenings   Food Insecurity: No Food Insecurity (08/31/2022)  Housing: Low Risk  (05/03/2021)  Transportation Needs: No Transportation Needs (05/03/2021)  Alcohol Screen: Low Risk  (08/31/2022)  Depression (PHQ2-9): Low Risk  (10/22/2022)  Financial Resource Strain: Low Risk  (08/31/2022)  Physical  Activity: Insufficiently Active (08/31/2022)  Social Connections: Socially Isolated (08/31/2022)  Stress: No Stress Concern Present (08/31/2022)  Tobacco Use: Medium Risk (10/24/2022)     Readmission Risk Interventions     No data to display

## 2022-10-29 ENCOUNTER — Encounter: Payer: Self-pay | Admitting: Orthopedic Surgery

## 2022-10-29 ENCOUNTER — Non-Acute Institutional Stay (SKILLED_NURSING_FACILITY): Payer: Medicare Other | Admitting: Orthopedic Surgery

## 2022-10-29 DIAGNOSIS — N184 Chronic kidney disease, stage 4 (severe): Secondary | ICD-10-CM

## 2022-10-29 DIAGNOSIS — Z8572 Personal history of non-Hodgkin lymphomas: Secondary | ICD-10-CM

## 2022-10-29 DIAGNOSIS — D630 Anemia in neoplastic disease: Secondary | ICD-10-CM

## 2022-10-29 DIAGNOSIS — E440 Moderate protein-calorie malnutrition: Secondary | ICD-10-CM | POA: Diagnosis not present

## 2022-10-29 DIAGNOSIS — E039 Hypothyroidism, unspecified: Secondary | ICD-10-CM

## 2022-10-29 DIAGNOSIS — E860 Dehydration: Secondary | ICD-10-CM | POA: Diagnosis not present

## 2022-10-29 DIAGNOSIS — S42491D Other displaced fracture of lower end of right humerus, subsequent encounter for fracture with routine healing: Secondary | ICD-10-CM

## 2022-10-29 LAB — CULTURE, BLOOD (ROUTINE X 2)
Culture: NO GROWTH
Culture: NO GROWTH
Special Requests: ADEQUATE
Special Requests: ADEQUATE

## 2022-10-29 NOTE — Progress Notes (Signed)
Location:  [place] Room Number: 16/A Place of Service:  SNF [phone]) Provider:  Yvonna Alanis, NP   Virgie Dad, MD  Patient Care Team: Virgie Dad, MD as PCP - General (Internal Medicine)  Extended Emergency Contact Information Primary Emergency Contact: Genia Plants Address: [address]          [place], [place] [address] Johnnette Litter of [place] Phone: [phone] Relation: Alanson Puls Secondary Emergency Contact: [name] Address: [address]          [place], [place] [address] Johnnette Litter of [place] Phone: [phone] Mobile Phone: [phone] Relation: Nephew  Code Status:  DNR Goals of care: Advanced Directive information    10/26/2022   12:29 PM  Advanced Directives  Does Patient Have a Medical Advance Directive? Yes  Type of Advance Directive [place]  Does patient want to make changes to medical advance directive? No - Patient declined     Chief Complaint  Patient presents with   Hospitalization Follow-up    HPI:  Pt is a 89 y.o. female seen today for f/u s/p hospitalization [facility] 01/24-01/26 due to hypotension.   She recently moved to skilled nursing at [facility]. PMH: HTN, CKD,GERD, hypothyroidism, h/o frontoparietal SAH with rib fracture, cerebellar infarct, trigeminal neuralgia, osteoporosis, pancytopenia, CLL/ B cell lymphoma.   Recently moved to skilled nursing due to weakness.    12/22 Calquence stopped due to low WBC and worsening kidney function.    12/29 mechanical fall that resulted in right humerus fracture.    01/16 syncopal event while working with PT/OT. ED evaluation> related to dehydration. Albumin < 1.5. BUN/creat 76/2.93.   01/22 she was seen for poor appetite and dehydration> 1L NS bolus given> HR and BP improved.   01/24 she was evaluated by oncology and advised to report to ED due to tachycardia and hypotension. BUN/creat 71/3.17 01/24. Hypotension/hypovolemia  related to ongoing poor appetite. HR, BP and creatinine improved with IVF. She was discharged to [facility].   Today, she continues to have poor appetite. She states" I am just not hungry." Care goals discussed with Will, HPOA and patient. Hospice discussed. Both would like to see if she bounces back and if appetite improves. HPOA concerned she is having trouble feeding herself due to recent right humerus fracture. Will start supervised feedings. She missed f/u with Dr. Stann Mainland 2 weeks ago. She did go to [facility]. She reports some discomfort with shoulder brace.      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[place])    Per [facility]   History of B-cell lymphoma 11/02/2015   History of falling    [place]   Hypothyroidism, unspecified    per [facility]   Lymphedema, not elsewhere classified    Per [facility]   Migraines    Muscle weakness (generalized)    [place]   Neuralgia and neuritis, unspecified    [place]   Nontraumatic subarachnoid hemorrhage, unspecified ([place])    per [facility]   Osteoporosis    Pain in right shoulder    Tachycardia, unspecified  [facility]   Thyroid disease    Trigeminal neuralgia of left side of face    Unsteadiness on feet    [facility]   Past Surgical History:  Procedure Laterality Date   gamma knife     for trigeminal neuralgia   SHOULDER SURGERY     Right shoulder replacement   TONSILLECTOMY AND ADENOIDECTOMY     WISDOM TOOTH EXTRACTION      Allergies  Allergen Reactions   Penicillins Hives   Tegretol [Carbamazepine] Hives and Other (See Comments)    Turned purple from neck down and drowsy    Lyrica [Pregabalin] Other (See Comments)    Drowsy     Outpatient Encounter Medications as of 10/29/2022  Medication Sig   acetaminophen (TYLENOL) 500 MG tablet Take 500 mg by mouth every 8 (eight) hours as needed for mild pain.   acetaminophen (TYLENOL) 500 MG tablet Take 500 mg by mouth 3 (three) times daily.   Amino Acids-Protein Hydrolys (FEEDING SUPPLEMENT, PRO-STAT SUGAR FREE 64,) LIQD Take 30 mLs by mouth daily.   aspirin 81 MG chewable tablet Chew 81 mg by mouth daily.   Emollient (AQUAPHOR OINTMENT BODY EX) Apply 1 Application topically at bedtime. for dry skin   gabapentin (NEURONTIN) 600 MG tablet Take 600 mg by mouth 3 (three) times daily.   lactose free nutrition (BOOST) LIQD Take 237 mLs by mouth 2 (two) times daily between meals. Or Nutrition supplement of choice ( Chocolate)   levothyroxine (SYNTHROID) 75 MCG tablet Take 75 mcg by mouth daily before breakfast.   loperamide (IMODIUM A-D) 2 MG capsule Take 4 mg by mouth every 8 (eight) hours as needed for diarrhea or loose stools.   methocarbamol (ROBAXIN) 500 MG tablet Take 0.5 tablets (250 mg total) by mouth 3 (three) times daily as needed for muscle spasms.   Multiple Vitamin (MULTI-VITAMINS) TABS Take 1 tablet by mouth daily.   pantoprazole (PROTONIX) 40 MG tablet Take 40 mg by mouth 2 (two) times daily.   polyethylene glycol powder (MIRALAX) 17 GM/SCOOP powder Give 17 gram by mouth as needed for Bowel Management   polyvinyl alcohol (LIQUIFILM TEARS) 1.4 % ophthalmic solution Place 1 drop into both eyes 4 (four) times daily as needed for dry eyes.   simethicone (MYLICON) 235 MG chewable tablet Chew 125 mg by mouth daily as needed for flatulence.   vitamin B-12 (CYANOCOBALAMIN) 1000 MCG tablet Take 1 tablet (1,000 mcg total) by mouth daily.   zinc oxide 20 % ointment Apply 1 application. topically as needed for irritation. To buttocks after every incontinent episode and as needed for redness. May keep at bedside.   Facility-Administered Encounter Medications as of 10/29/2022   Medication   epoetin alfa-epbx (RETACRIT) injection 40,000 Units    Review of Systems  Constitutional:  Positive for activity change, appetite change and fatigue. Negative for fever.  HENT:  Negative for congestion and trouble swallowing.   Eyes:  Negative for visual disturbance.  Respiratory:  Negative for cough, shortness of breath and wheezing.   Cardiovascular:  Negative for chest pain, palpitations and leg swelling.  Gastrointestinal:  Negative for abdominal distention, abdominal pain, nausea and vomiting.  Genitourinary:  Negative for dysuria and frequency.  Musculoskeletal:  Positive for arthralgias and gait problem.  Skin:  Negative for wound.  Neurological:  Positive for dizziness and weakness. Negative for light-headedness.  Psychiatric/Behavioral:  Negative for confusion, dysphoric mood and sleep disturbance. The patient is not nervous/anxious.     Immunization History  Administered Date(s)  Administered   Fluad Quad(high Dose 65+) 05/28/2019, 07/25/2022   Influenza Split 07/12/2016, 08/13/2017, 06/02/2019, 09/09/2020   Influenza, High Dose Seasonal PF 06/11/2017, 06/28/2018   Influenza-Unspecified 06/21/2020, 07/25/2021   Moderna SARS-COV2 Booster Vaccination 03/08/2021   Moderna Sars-Covid-2 Vaccination 10/05/2019, 11/02/2019, 08/15/2020   PFIZER(Purple Top)SARS-COV-2 Vaccination 06/21/2021   Pfizer Covid-19 Vaccine Bivalent Booster 51yr & up 08/07/2022   Pneumococcal Conjugate-13 11/22/2014   Pneumococcal Polysaccharide-23 04/29/2009, 08/13/2017   Td 01/24/2013   Td (Adult) 01/24/2013   Tdap 12/28/2017, 07/01/2018, 12/13/2020   Zoster Recombinat (Shingrix) 07/01/2018   Zoster, Live 11/12/2009, 07/01/2018   Zoster, Unspecified 12/17/2005   Pertinent  Health Maintenance Due  Topic Date Due   INFLUENZA VACCINE  Completed   DEXA SCAN  Completed      09/21/2022    2:18 PM 09/28/2022    4:34 PM 10/17/2022   10:45 AM 10/22/2022    1:43 PM 10/23/2022   10:27 AM   Fall Risk  Falls in the past year?   0 0 1  Was there an injury with Fall?   0 0 0  Fall Risk Category Calculator   0 0 2  (RETIRED) Patient Fall Risk Level High fall risk High fall risk     Patient at Risk for Falls Due to   History of fall(s);Impaired balance/gait History of fall(s);Impaired balance/gait History of fall(s);Impaired balance/gait  Fall risk Follow up   Falls evaluation completed Falls evaluation completed Falls evaluation completed   Functional Status Survey:    Vitals:   10/29/22 1006  BP: 130/70  Pulse: (!) 110  Resp: (!) 21  Temp: (!) 96.5 F (35.8 C)  SpO2: 96%  Weight: 95 lb (43.1 kg)  Height: '5\' 6"'$  (1.676 m)   Body mass index is 15.33 kg/m. Physical Exam Vitals reviewed.  Constitutional:      Appearance: She is underweight.  HENT:     Head: Normocephalic.  Eyes:     General:        Right eye: No discharge.        Left eye: No discharge.  Cardiovascular:     Rate and Rhythm: Tachycardia present.     Pulses: Normal pulses.     Heart sounds: Murmur heard.  Pulmonary:     Effort: No respiratory distress.     Breath sounds: Normal breath sounds. No wheezing or rales.  Abdominal:     General: Bowel sounds are normal. There is no distension.     Palpations: Abdomen is soft.     Tenderness: There is no abdominal tenderness.  Musculoskeletal:     Cervical back: Neck supple.     Right lower leg: No edema.     Left lower leg: No edema.     Comments: Right shoulder brace  Skin:    General: Skin is warm and dry.     Capillary Refill: Capillary refill takes less than 2 seconds.  Neurological:     General: No focal deficit present.     Mental Status: She is alert and oriented to person, place, and time.     Motor: Weakness present.     Gait: Gait abnormal.     Comments: wheelchair  Psychiatric:        Mood and Affect: Mood normal.        Behavior: Behavior normal.     Labs reviewed: Recent Labs    10/24/22 1602 10/25/22 0800  10/26/22 0701  NA 136 136 135  K 4.1 3.8 4.4  CL 108 108 106  CO2 18* 18* 20*  GLUCOSE 118* 112* 95  BUN 71* 54* 50*  CREATININE 2.99* 2.64* 2.52*  CALCIUM 7.9* 7.7* 7.9*   Recent Labs    10/24/22 1409 10/24/22 1602 10/25/22 0800  AST 12* 17 18  ALT '6 9 10  '$ ALKPHOS 114 109 93  BILITOT 0.2* 0.4 0.5  PROT 6.8 6.6 6.3*  ALBUMIN 2.4* 1.6* <1.5*   Recent Labs    10/16/22 1200 10/24/22 1409 10/24/22 1602 10/25/22 0800  WBC 8.0 9.1 8.5 7.6  NEUTROABS 4.3 2.4 2.5  --   HGB 9.2* 10.4* 9.5* 10.0*  HCT 30.1* 32.9* 29.7* 31.5*  MCV 104.5* 101.9* 99.3 103.6*  PLT 287 286 286 247   Lab Results  Component Value Date   TSH 0.02 (A) 10/04/2022   No results found for: "HGBA1C" No results found for: "CHOL", "HDL", "LDLCALC", "LDLDIRECT", "TRIG", "CHOLHDL"  Significant Diagnostic Results in last 30 days:  US Venous Img Lower Bilateral  Result Date: 10/24/2022 CLINICAL DATA:  Short of breath, evaluate for deep venous thrombosis EXAM: Bilateral LOWER EXTREMITY VENOUS DOPPLER ULTRASOUND TECHNIQUE: Gray-scale sonography with compression, as well as color and duplex ultrasound, were performed to evaluate the deep venous system(s) from the level of the common femoral vein through the popliteal and proximal calf veins. COMPARISON:  None Available. FINDINGS: VENOUS Normal compressibility of the common femoral, superficial femoral, and popliteal veins, as well as the visualized calf veins. Visualized portions of profunda femoral vein and great saphenous vein unremarkable. No filling defects to suggest DVT on grayscale or color Doppler imaging. Doppler waveforms show normal direction of venous flow, normal respiratory plasticity and response to augmentation. Limited views of the contralateral common femoral vein are unremarkable. OTHER None. Limitations: none IMPRESSION: No evidence of deep venous thrombosis within LEFT or RIGHT lower extremity. Electronically Signed   By: Suzy Bouchard M.D.   On:  10/24/2022 18:33   DG Chest Port 1 View  Result Date: 10/24/2022 CLINICAL DATA:  Sepsis EXAM: PORTABLE CHEST 1 VIEW COMPARISON:  10/16/2022 x-ray FINDINGS: Slightly elevated right hemidiaphragm. No consolidation, pneumothorax or effusion. Normal cardiopericardial silhouette without edema. Overlapping cardiac leads. Right shoulder arthroplasty. Film is slightly rotated. Old rib fractures. IMPRESSION: No active disease.  No acute cardiopulmonary disease. Electronically Signed   By: Jill Side M.D.   On: 10/24/2022 15:59   CT Head Wo Contrast  Result Date: 10/16/2022 CLINICAL DATA:  Syncopal episode. EXAM: CT HEAD WITHOUT CONTRAST TECHNIQUE: Contiguous axial images were obtained from the base of the skull through the vertex without intravenous contrast. RADIATION DOSE REDUCTION: This exam was performed according to the departmental dose-optimization program which includes automated exposure control, adjustment of the mA and/or kV according to patient size and/or use of iterative reconstruction technique. COMPARISON:  Head CT dated 09/28/2022. FINDINGS: Brain: Generalized age related parenchymal volume loss with commensurate dilatation of the ventricles and sulci. Mild chronic small vessel ischemic changes within the bilateral periventricular and subcortical white matter regions. No mass, hemorrhage, edema or other evidence of acute parenchymal abnormality. No extra-axial hemorrhage. Vascular: Chronic calcified atherosclerotic changes of the large vessels at the skull base. No unexpected hyperdense vessel. Skull: Normal. Negative for fracture or focal lesion. Sinuses/Orbits: No acute finding. Other: None. IMPRESSION: 1. No acute findings. No intracranial mass, hemorrhage or edema. 2. Mild chronic small vessel ischemic changes in the white matter. Electronically Signed   By: Franki Cabot M.D.   On: 10/16/2022 13:55   DG Chest  2 View  Result Date: 10/16/2022 CLINICAL DATA:  Syncope. EXAM: CHEST - 2 VIEW  COMPARISON:  CT chest dated October 19, 2020. Chest x-ray dated August 24, 2020. FINDINGS: The heart size and mediastinal contours are within normal limits. Normal pulmonary vascularity. No focal consolidation, pleural effusion, or pneumothorax. No acute osseous abnormality. Old bilateral rib and sternal fractures again noted. IMPRESSION: No active cardiopulmonary disease. Electronically Signed   By: Titus Dubin M.D.   On: 10/16/2022 12:19    Assessment/Plan 1. Dehydration - hospitalized 01/24-01/26 - BUN/creat 71/3.17 10/24/2022 - improved with IVF - HR 110's apical - continues to have poor po intake - encourage hydration with water - consider IVF 01/30  2. Moderate protein-calorie malnutrition (HCC) - BMI 15.33, protein 6.8, albumin 2.4 > was < 1.5 - Hospice recommended/ discussed> would like to see how she does for a few days - see above - start supervised feedings - increase milkshakes to TID  3. Stage 4 chronic kidney disease (HCC) - BUN/creat 71/3.17 (01/24)> 50/2.52 (01/26) - encourage hydration with water - avoid NSAIDS - repeat CMP 02/01  4. History of B-cell lymphoma - followed by oncology - off Calquence due to low WBC  5. Acquired hypothyroidism - TSH stable - cont levothyroxine  6. Anemia in neoplastic disease - hgb 10.0 (01/25)> was 9.5 - cbc/diff 02/01  7. Other closed displaced fracture of distal end of right humerus with routine healing, subsequent encounter - 12/29 mechanical fall - followed by Dr. Stann Mainland conservative management - visited [place] > brace applied - no f/u scheduled  - will have nursing schedule with Dr. Stann Mainland    Family/ staff Communication: plan discussed with patient, HPOA, and nurse  Labs/tests ordered: cbc/diff, cmp 02/01

## 2022-10-30 ENCOUNTER — Non-Acute Institutional Stay (SKILLED_NURSING_FACILITY): Payer: Medicare Other | Admitting: Adult Health

## 2022-10-30 ENCOUNTER — Encounter: Payer: Self-pay | Admitting: Adult Health

## 2022-10-30 DIAGNOSIS — R638 Other symptoms and signs concerning food and fluid intake: Secondary | ICD-10-CM | POA: Diagnosis not present

## 2022-10-30 DIAGNOSIS — E039 Hypothyroidism, unspecified: Secondary | ICD-10-CM

## 2022-10-30 DIAGNOSIS — R000 Tachycardia, unspecified: Secondary | ICD-10-CM | POA: Diagnosis not present

## 2022-10-30 NOTE — Progress Notes (Signed)
Location:  [facility] Room Number: 16/A Place of Service:  SNF (31) Provider:  Durenda Age, DNP, FNP-BC  Patient Care Team: Virgie Dad, MD as PCP - General (Internal Medicine)  Extended Emergency Contact Information Primary Emergency Contact: Genia Plants Address: [address]          [place], [place] [address] Johnnette Litter of [place] Phone: [phone] Relation: Alanson Puls Secondary Emergency Contact: [name] Address: [address]          [place], [place] [address] Johnnette Litter of [place] Phone: [phone] Mobile Phone: [phone] Relation: Nephew  Code Status:  DNR  Goals of care: Advanced Directive information    10/26/2022   12:29 PM  Advanced Directives  Does Patient Have a Medical Advance Directive? Yes  Type of Advance Directive [place]  Does patient want to make changes to medical advance directive? No - Patient declined     Chief Complaint  Patient presents with   Acute Visit    Tachycardia    HPI:  Pt is a 89 y.o. female seen today for an acute visit regarding tachycardia. He was re-admitted to [facility] on 10/26/22 post hospitalization 10/24/22 to 10/26/22 for hypotension thought to be due to hypovolemia in the setting of poor oral intake.  She was found to have AKI on CKD stage IV and was given IVF for hydration. She has a PMH of marginal zone lymphoma and hypothyroidism.  Today, she was seen in the room. She was noted to have HR 123 and BP 135/74. She ate 50% for breakfast and 25% for lunch. Recheck of HR was 112.  Staff reported that her intake was better today compared to yesterday.     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[place])    Per [facility]   History of B-cell lymphoma 11/02/2015   History of falling    [place]   Hypothyroidism, unspecified    per [facility]   Lymphedema, not elsewhere classified    Per [facility]   Migraines    Muscle weakness (generalized)    [place]   Neuralgia and neuritis, unspecified    [place]   Nontraumatic subarachnoid hemorrhage, unspecified ([place])    per [facility]   Osteoporosis    Pain in right shoulder    Tachycardia, unspecified    [place]   Thyroid disease    Trigeminal neuralgia of left side of face    Unsteadiness on feet    [facility]   Past Surgical History:  Procedure Laterality Date   gamma knife     for trigeminal neuralgia   SHOULDER SURGERY     Right shoulder replacement   TONSILLECTOMY AND ADENOIDECTOMY     WISDOM TOOTH EXTRACTION      Allergies  Allergen Reactions   Penicillins Hives   Tegretol [Carbamazepine] Hives and Other (See Comments)    Turned purple from neck down and drowsy    Lyrica [Pregabalin] Other (See Comments)    Drowsy    Outpatient Encounter Medications as of 10/30/2022  Medication Sig  acetaminophen (TYLENOL) 500 MG tablet Take 500 mg by mouth every 8 (eight) hours as needed for mild pain.   acetaminophen (TYLENOL) 500 MG tablet Take 500 mg by mouth 3 (three) times daily.   Amino Acids-Protein Hydrolys (FEEDING SUPPLEMENT, PRO-STAT SUGAR FREE 64,) LIQD Take 30 mLs by mouth daily.   aspirin 81 MG chewable tablet Chew 81 mg by mouth daily.   Emollient (AQUAPHOR OINTMENT BODY EX) Apply 1 Application topically at bedtime. for dry skin   gabapentin (NEURONTIN) 600 MG tablet Take 600 mg by mouth 3 (three) times daily.   lactose free nutrition (BOOST) LIQD Take 237 mLs by mouth 2 (two) times daily between meals. Or Nutrition supplement of choice ( Chocolate)   levothyroxine (SYNTHROID) 75 MCG tablet Take 75 mcg by mouth daily before breakfast.   loperamide (IMODIUM A-D) 2 MG  capsule Take 4 mg by mouth every 8 (eight) hours as needed for diarrhea or loose stools.   methocarbamol (ROBAXIN) 500 MG tablet Take 0.5 tablets (250 mg total) by mouth 3 (three) times daily as needed for muscle spasms.   Multiple Vitamin (MULTI-VITAMINS) TABS Take 1 tablet by mouth daily.   pantoprazole (PROTONIX) 40 MG tablet Take 40 mg by mouth 2 (two) times daily.   Pollen Extracts (PROSTAT PO) Take 30 g by mouth daily.   polyethylene glycol powder (MIRALAX) 17 GM/SCOOP powder Give 17 gram by mouth as needed for Bowel Management   polyvinyl alcohol (LIQUIFILM TEARS) 1.4 % ophthalmic solution Place 1 drop into both eyes 4 (four) times daily as needed for dry eyes.   simethicone (MYLICON) 710 MG chewable tablet Chew 125 mg by mouth daily as needed for flatulence.   traMADol (ULTRAM) 50 MG tablet Take 25 mg by mouth 3 (three) times daily as needed for moderate pain or severe pain.   vitamin B-12 (CYANOCOBALAMIN) 1000 MCG tablet Take 1 tablet (1,000 mcg total) by mouth daily.   zinc oxide 20 % ointment Apply 1 application. topically as needed for irritation. To buttocks after every incontinent episode and as needed for redness. May keep at bedside.   Facility-Administered Encounter Medications as of 10/30/2022  Medication   epoetin alfa-epbx (RETACRIT) injection 40,000 Units    Review of Systems  Constitutional:  Positive for appetite change and fatigue. Negative for chills and fever.  HENT:  Negative for congestion, hearing loss, rhinorrhea and sore throat.   Eyes: Negative.   Respiratory:  Negative for cough, shortness of breath and wheezing.   Cardiovascular:  Negative for chest pain, palpitations and leg swelling.  Gastrointestinal:  Negative for abdominal pain, constipation, diarrhea, nausea and vomiting.  Genitourinary:  Negative for dysuria.  Musculoskeletal:  Negative for arthralgias, back pain and myalgias.  Skin:  Negative for color change, rash and wound.  Neurological:   Negative for dizziness, weakness and headaches.  Psychiatric/Behavioral:  Negative for behavioral problems. The patient is not nervous/anxious.        Immunization History  Administered Date(s) Administered   Fluad Quad(high Dose 65+) 05/28/2019, 07/25/2022   Influenza Split 07/12/2016, 08/13/2017, 06/02/2019, 09/09/2020   Influenza, High Dose Seasonal PF 06/11/2017, 06/28/2018   Influenza-Unspecified 06/21/2020, 07/25/2021   Moderna SARS-COV2 Booster Vaccination 03/08/2021   Moderna Sars-Covid-2 Vaccination 10/05/2019, 11/02/2019, 08/15/2020   PFIZER(Purple Top)SARS-COV-2 Vaccination 06/21/2021   Pfizer Covid-19 Vaccine Bivalent Booster 85yr & up 08/07/2022   Pneumococcal Conjugate-13 11/22/2014   Pneumococcal Polysaccharide-23 04/29/2009, 08/13/2017   Td 01/24/2013   Td (Adult) 01/24/2013   Tdap 12/28/2017, 07/01/2018, 12/13/2020  Zoster Recombinat (Shingrix) 07/01/2018   Zoster, Live 11/12/2009, 07/01/2018   Zoster, Unspecified 12/17/2005   Pertinent  Health Maintenance Due  Topic Date Due   INFLUENZA VACCINE  Completed   DEXA SCAN  Completed      09/21/2022    2:18 PM 09/28/2022    4:34 PM 10/17/2022   10:45 AM 10/22/2022    1:43 PM 10/23/2022   10:27 AM  [place] in the past year?   0 0 1  Was there an injury with Fall?   0 0 0  Fall Risk Category Calculator   0 0 2  (RETIRED) Patient Fall Risk Level High fall risk High fall risk     Patient at Risk for Falls Due to   History of fall(s);Impaired balance/gait History of fall(s);Impaired balance/gait History of fall(s);Impaired balance/gait  Fall risk Follow up   Falls evaluation completed Falls evaluation completed Falls evaluation completed     Vitals:   10/30/22 1150  BP: 130/71  Pulse: (!) 123  Resp: (!) 21  Temp: (!) 96.5 F (35.8 C)  TempSrc: Temporal  SpO2: 96%  Weight: 116 lb 14.4 oz (53 kg)  Height: '5\' 6"'$  (1.676 m)   Body mass index is 18.87 kg/m.  Physical Exam Constitutional:       General: She is not in acute distress. HENT:     Head: Normocephalic and atraumatic.     Nose: Nose normal.     Mouth/Throat:     Mouth: Mucous membranes are moist.  Eyes:     Conjunctiva/sclera: Conjunctivae normal.  Cardiovascular:     Rate and Rhythm: Normal rate and regular rhythm.  Pulmonary:     Effort: Pulmonary effort is normal.     Breath sounds: Normal breath sounds.  Abdominal:     General: Bowel sounds are normal.     Palpations: Abdomen is soft.  Musculoskeletal:     Cervical back: Normal range of motion.     Comments: Right upper arm with splint and on sling.  Skin:    General: Skin is warm and dry.  Neurological:     Mental Status: She is alert and oriented to person, place, and time. Mental status is at baseline.  Psychiatric:        Mood and Affect: Mood normal.        Behavior: Behavior normal.        Labs reviewed: Recent Labs    10/24/22 1602 10/25/22 0800 10/26/22 0701  NA 136 136 135  K 4.1 3.8 4.4  CL 108 108 106  CO2 18* 18* 20*  GLUCOSE 118* 112* 95  BUN 71* 54* 50*  CREATININE 2.99* 2.64* 2.52*  CALCIUM 7.9* 7.7* 7.9*   Recent Labs    10/24/22 1409 10/24/22 1602 10/25/22 0800  AST 12* 17 18  ALT '6 9 10  '$ ALKPHOS 114 109 93  BILITOT 0.2* 0.4 0.5  PROT 6.8 6.6 6.3*  ALBUMIN 2.4* 1.6* <1.5*   Recent Labs    10/16/22 1200 10/24/22 1409 10/24/22 1602 10/25/22 0800  WBC 8.0 9.1 8.5 7.6  NEUTROABS 4.3 2.4 2.5  --   HGB 9.2* 10.4* 9.5* 10.0*  HCT 30.1* 32.9* 29.7* 31.5*  MCV 104.5* 101.9* 99.3 103.6*  PLT 287 286 286 247   Lab Results  Component Value Date   TSH 0.02 (A) 10/04/2022   No results found for: "HGBA1C" No results found for: "CHOL", "HDL", "LDLCALC", "LDLDIRECT", "TRIG", "CHOLHDL"  Significant Diagnostic Results in last  30 days:  US Venous Img Lower Bilateral  Result Date: 10/24/2022 CLINICAL DATA:  Short of breath, evaluate for deep venous thrombosis EXAM: Bilateral LOWER EXTREMITY VENOUS DOPPLER ULTRASOUND  TECHNIQUE: Gray-scale sonography with compression, as well as color and duplex ultrasound, were performed to evaluate the deep venous system(s) from the level of the common femoral vein through the popliteal and proximal calf veins. COMPARISON:  None Available. FINDINGS: VENOUS Normal compressibility of the common femoral, superficial femoral, and popliteal veins, as well as the visualized calf veins. Visualized portions of profunda femoral vein and great saphenous vein unremarkable. No filling defects to suggest DVT on grayscale or color Doppler imaging. Doppler waveforms show normal direction of venous flow, normal respiratory plasticity and response to augmentation. Limited views of the contralateral common femoral vein are unremarkable. OTHER None. Limitations: none IMPRESSION: No evidence of deep venous thrombosis within LEFT or RIGHT lower extremity. Electronically Signed   By: Suzy Bouchard M.D.   On: 10/24/2022 18:33   DG Chest Port 1 View  Result Date: 10/24/2022 CLINICAL DATA:  Sepsis EXAM: PORTABLE CHEST 1 VIEW COMPARISON:  10/16/2022 x-ray FINDINGS: Slightly elevated right hemidiaphragm. No consolidation, pneumothorax or effusion. Normal cardiopericardial silhouette without edema. Overlapping cardiac leads. Right shoulder arthroplasty. Film is slightly rotated. Old rib fractures. IMPRESSION: No active disease.  No acute cardiopulmonary disease. Electronically Signed   By: Jill Side M.D.   On: 10/24/2022 15:59   CT Head Wo Contrast  Result Date: 10/16/2022 CLINICAL DATA:  Syncopal episode. EXAM: CT HEAD WITHOUT CONTRAST TECHNIQUE: Contiguous axial images were obtained from the base of the skull through the vertex without intravenous contrast. RADIATION DOSE REDUCTION: This exam was performed according to the departmental dose-optimization program which includes automated exposure control, adjustment of the mA and/or kV according to patient size and/or use of iterative reconstruction  technique. COMPARISON:  Head CT dated 09/28/2022. FINDINGS: Brain: Generalized age related parenchymal volume loss with commensurate dilatation of the ventricles and sulci. Mild chronic small vessel ischemic changes within the bilateral periventricular and subcortical white matter regions. No mass, hemorrhage, edema or other evidence of acute parenchymal abnormality. No extra-axial hemorrhage. Vascular: Chronic calcified atherosclerotic changes of the large vessels at the skull base. No unexpected hyperdense vessel. Skull: Normal. Negative for fracture or focal lesion. Sinuses/Orbits: No acute finding. Other: None. IMPRESSION: 1. No acute findings. No intracranial mass, hemorrhage or edema. 2. Mild chronic small vessel ischemic changes in the white matter. Electronically Signed   By: Franki Cabot M.D.   On: 10/16/2022 13:55   DG Chest 2 View  Result Date: 10/16/2022 CLINICAL DATA:  Syncope. EXAM: CHEST - 2 VIEW COMPARISON:  CT chest dated October 19, 2020. Chest x-ray dated August 24, 2020. FINDINGS: The heart size and mediastinal contours are within normal limits. Normal pulmonary vascularity. No focal consolidation, pleural effusion, or pneumothorax. No acute osseous abnormality. Old bilateral rib and sternal fractures again noted. IMPRESSION: No active cardiopulmonary disease. Electronically Signed   By: Titus Dubin M.D.   On: 10/16/2022 12:19    Assessment/Plan  1. Tachycardia -  thought to be due dehydration from poor oral intake -   per staff she is eating more than her usual -  Encourage oral fluids  2. Poor fluid intake -  will start on D5 0.9NS IVF at 70 ml/h X 1L  3. Hypothyroidism, unspecified type -  recently decreased dosage of Levothyroxine to 75 mcg daily from 88 mcg daily    Family/ staff Communication: Discussed  plan of care with resident and charge nurse  Labs/tests ordered: for BMP and CBC on 11/01/22    Durenda Age, DNP, MSN, FNP-BC [facility] [phone] (Monday-Friday 8:00 a.m. - 5:00 p.m.) [phone] (after hours)

## 2022-10-31 ENCOUNTER — Encounter: Payer: Self-pay | Admitting: Orthopedic Surgery

## 2022-10-31 ENCOUNTER — Non-Acute Institutional Stay (SKILLED_NURSING_FACILITY): Payer: Medicare Other | Admitting: Orthopedic Surgery

## 2022-10-31 DIAGNOSIS — E860 Dehydration: Secondary | ICD-10-CM

## 2022-10-31 DIAGNOSIS — E440 Moderate protein-calorie malnutrition: Secondary | ICD-10-CM | POA: Diagnosis not present

## 2022-10-31 DIAGNOSIS — R000 Tachycardia, unspecified: Secondary | ICD-10-CM | POA: Diagnosis not present

## 2022-10-31 DIAGNOSIS — Z7189 Other specified counseling: Secondary | ICD-10-CM

## 2022-10-31 MED ORDER — METOPROLOL TARTRATE 25 MG PO TABS: 12.5000 mg | ORAL_TABLET | Freq: Two times a day (BID) | ORAL | 0 refills | Status: AC | PRN

## 2022-10-31 NOTE — Progress Notes (Signed)
Location:  [facility] Room Number: 16/A Place of Service:  SNF [phone]) Provider:  Yvonna Alanis, NP   Virgie Dad, MD  Patient Care Team: Virgie Dad, MD as PCP - General (Internal Medicine)  Extended Emergency Contact Information Primary Emergency Contact: Genia Plants Address: [address]          [place], [place] [address] Johnnette Litter of [place] Phone: [phone] Relation: Alanson Puls Secondary Emergency Contact: [name] Address: [address]          [place], [place] [address] Johnnette Litter of [place] Phone: [phone] Mobile Phone: [phone] Relation: Nephew  Code Status:  DNR Goals of care: Advanced Directive information    10/26/2022   12:29 PM  Advanced Directives  Does Patient Have a Medical Advance Directive? Yes  Type of Advance Directive [place]  Does patient want to make changes to medical advance directive? No - Patient declined     Chief Complaint  Patient presents with   Acute Visit    Tachycardia     HPI:  Pt is a 89 y.o. female seen today for acute visit due to ongoing tachycardia.   She recently moved to skilled nursing at [facility]. PMH: HTN, CKD,GERD, hypothyroidism, h/o frontoparietal SAH with rib fracture, cerebellar infarct, trigeminal neuralgia, osteoporosis, pancytopenia, CLL/ B cell lymphoma.   "12/22 Calquence stopped due to low WBC and worsening kidney function.    12/29 mechanical fall that resulted in right humerus fracture.    01/16 syncopal event while working with PT/OT. ED evaluation> related to dehydration. Albumin < 1.5. BUN/creat 76/2.93.    01/22 she was seen for poor appetite and dehydration> 1L NS bolus given> HR and BP improved.    01/24 she was evaluated by oncology and advised to report to ED due to tachycardia and hypotension. BUN/creat 71/3.17 01/24. Hypotension/hypovolemia related to ongoing poor appetite. HR, BP and creatinine improved with IVF.  She was discharged to [facility]."  01/30 she was started on IVF D5NS due to HR> 120. Today, HR is 110's. SBP> 140. She reports feeling better. Nutrition improved with supervised feedings.   MOST form not completed. She is DNR.          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[place])    Per [facility]   History of B-cell lymphoma 11/02/2015   History of falling    [place]   Hypothyroidism, unspecified    per [facility]   Lymphedema, not elsewhere classified    Per [facility]   Migraines    Muscle weakness (generalized)    [place]   Neuralgia and neuritis, unspecified    [place]   Nontraumatic subarachnoid hemorrhage, unspecified ([place])    per [facility]   Osteoporosis    Pain in right shoulder    Tachycardia, unspecified    [place]   Thyroid disease    Trigeminal neuralgia of left side of face    Unsteadiness on feet    [facility]   Past Surgical History:  Procedure Laterality Date   gamma knife  for trigeminal neuralgia   SHOULDER SURGERY     Right shoulder replacement   TONSILLECTOMY AND ADENOIDECTOMY     WISDOM TOOTH EXTRACTION      Allergies  Allergen Reactions   Penicillins Hives   Tegretol [Carbamazepine] Hives and Other (See Comments)    Turned purple from neck down and drowsy    Lyrica [Pregabalin] Other (See Comments)    Drowsy    Outpatient Encounter Medications as of 10/31/2022  Medication Sig   acetaminophen (TYLENOL) 500 MG tablet Take 500 mg by mouth every 8 (eight) hours as needed for mild pain.   acetaminophen (TYLENOL) 500 MG tablet Take 500 mg by mouth 3 (three) times daily.   Amino Acids-Protein Hydrolys (FEEDING SUPPLEMENT, PRO-STAT SUGAR FREE 64,) LIQD  Take 30 mLs by mouth daily.   aspirin 81 MG chewable tablet Chew 81 mg by mouth daily.   Emollient (AQUAPHOR OINTMENT BODY EX) Apply 1 Application topically at bedtime. for dry skin   gabapentin (NEURONTIN) 600 MG tablet Take 600 mg by mouth 3 (three) times daily.   lactose free nutrition (BOOST) LIQD Take 237 mLs by mouth 2 (two) times daily between meals. Or Nutrition supplement of choice ( Chocolate)   levothyroxine (SYNTHROID) 75 MCG tablet Take 75 mcg by mouth daily before breakfast.   loperamide (IMODIUM A-D) 2 MG capsule Take 4 mg by mouth every 8 (eight) hours as needed for diarrhea or loose stools.   methocarbamol (ROBAXIN) 500 MG tablet Take 0.5 tablets (250 mg total) by mouth 3 (three) times daily as needed for muscle spasms.   Multiple Vitamin (MULTI-VITAMINS) TABS Take 1 tablet by mouth daily.   pantoprazole (PROTONIX) 40 MG tablet Take 40 mg by mouth 2 (two) times daily.   Pollen Extracts (PROSTAT PO) Take 30 g by mouth daily.   polyethylene glycol powder (MIRALAX) 17 GM/SCOOP powder Give 17 gram by mouth as needed for Bowel Management   polyvinyl alcohol (LIQUIFILM TEARS) 1.4 % ophthalmic solution Place 1 drop into both eyes 4 (four) times daily as needed for dry eyes.   simethicone (MYLICON) 338 MG chewable tablet Chew 125 mg by mouth daily as needed for flatulence.   traMADol (ULTRAM) 50 MG tablet Take 25 mg by mouth 3 (three) times daily as needed for moderate pain or severe pain.   vitamin B-12 (CYANOCOBALAMIN) 1000 MCG tablet Take 1 tablet (1,000 mcg total) by mouth daily.   zinc oxide 20 % ointment Apply 1 application. topically as needed for irritation. To buttocks after every incontinent episode and as needed for redness. May keep at bedside.   Facility-Administered Encounter Medications as of 10/31/2022  Medication   epoetin alfa-epbx (RETACRIT) injection 40,000 Units    Review of Systems  Constitutional:  Negative for activity change, appetite change, fatigue and  fever.  HENT:  Negative for congestion and trouble swallowing.   Eyes:  Negative for visual disturbance.  Respiratory:  Negative for cough, shortness of breath and wheezing.   Cardiovascular:  Negative for chest pain, palpitations and leg swelling.  Gastrointestinal:  Negative for abdominal distention and abdominal pain.  Genitourinary:  Negative for dysuria and frequency.  Musculoskeletal:  Positive for arthralgias and gait problem.  Skin:  Positive for wound.  Neurological:  Positive for weakness. Negative for dizziness and light-headedness.  Psychiatric/Behavioral:  Negative for confusion, dysphoric mood and sleep disturbance. The patient is not nervous/anxious.     Immunization History  Administered Date(s) Administered   Fluad Quad(high Dose 65+) 05/28/2019, 07/25/2022  Influenza Split 07/12/2016, 08/13/2017, 06/02/2019, 09/09/2020   Influenza, High Dose Seasonal PF 06/11/2017, 06/28/2018   Influenza-Unspecified 06/21/2020, 07/25/2021   Moderna SARS-COV2 Booster Vaccination 03/08/2021   Moderna Sars-Covid-2 Vaccination 10/05/2019, 11/02/2019, 08/15/2020   PFIZER(Purple Top)SARS-COV-2 Vaccination 06/21/2021   Pfizer Covid-19 Vaccine Bivalent Booster 29yr & up 08/07/2022   Pneumococcal Conjugate-13 11/22/2014   Pneumococcal Polysaccharide-23 04/29/2009, 08/13/2017   Td 01/24/2013   Td (Adult) 01/24/2013   Tdap 12/28/2017, 07/01/2018, 12/13/2020   Zoster Recombinat (Shingrix) 07/01/2018   Zoster, Live 11/12/2009, 07/01/2018   Zoster, Unspecified 12/17/2005   Pertinent  Health Maintenance Due  Topic Date Due   INFLUENZA VACCINE  Completed   DEXA SCAN  Completed      09/21/2022    2:18 PM 09/28/2022    4:34 PM 10/17/2022   10:45 AM 10/22/2022    1:43 PM 10/23/2022   10:27 AM  Fall Risk  Falls in the past year?   0 0 1  Was there an injury with Fall?   0 0 0  Fall Risk Category Calculator   0 0 2  (RETIRED) Patient Fall Risk Level High fall risk High fall risk      Patient at Risk for Falls Due to   History of fall(s);Impaired balance/gait History of fall(s);Impaired balance/gait History of fall(s);Impaired balance/gait  Fall risk Follow up   Falls evaluation completed Falls evaluation completed Falls evaluation completed   Functional Status Survey:    Vitals:   10/31/22 0950  BP: (!) 146/76  Pulse: (!) 112  Resp: 16  Temp: 97.7 F (36.5 C)  SpO2: 96%  Weight: 116 lb 14.4 oz (53 kg)  Height: '5\' 6"'$  (1.676 m)   Body mass index is 18.87 kg/m. Physical Exam Vitals reviewed.  Constitutional:      General: She is not in acute distress. HENT:     Head: Normocephalic.     Comments: Bruising to right browline, almost healed,no skin breakdown    Nose: Nose normal.     Mouth/Throat:     Mouth: Mucous membranes are moist.  Eyes:     General:        Right eye: No discharge.        Left eye: No discharge.  Cardiovascular:     Rate and Rhythm: Tachycardia present.     Pulses: Normal pulses.     Heart sounds: Normal heart sounds.  Pulmonary:     Effort: Pulmonary effort is normal. No respiratory distress.     Breath sounds: Normal breath sounds. No wheezing.  Abdominal:     General: Bowel sounds are normal. There is no distension.     Palpations: Abdomen is soft.     Tenderness: There is no abdominal tenderness.  Musculoskeletal:     Cervical back: Neck supple.     Right lower leg: No edema.     Left lower leg: No edema.  Skin:    General: Skin is warm and dry.     Capillary Refill: Capillary refill takes less than 2 seconds.  Neurological:     General: No focal deficit present.     Mental Status: She is alert and oriented to person, place, and time.     Motor: Weakness present.     Gait: Gait abnormal.  Psychiatric:        Mood and Affect: Mood normal.        Behavior: Behavior normal.     Labs reviewed: Recent Labs    10/24/22 1602 10/25/22 0800  10/26/22 0701  NA 136 136 135  K 4.1 3.8 4.4  CL 108 108 106  CO2 18* 18*  20*  GLUCOSE 118* 112* 95  BUN 71* 54* 50*  CREATININE 2.99* 2.64* 2.52*  CALCIUM 7.9* 7.7* 7.9*   Recent Labs    10/24/22 1409 10/24/22 1602 10/25/22 0800  AST 12* 17 18  ALT '6 9 10  '$ ALKPHOS 114 109 93  BILITOT 0.2* 0.4 0.5  PROT 6.8 6.6 6.3*  ALBUMIN 2.4* 1.6* <1.5*   Recent Labs    10/16/22 1200 10/24/22 1409 10/24/22 1602 10/25/22 0800  WBC 8.0 9.1 8.5 7.6  NEUTROABS 4.3 2.4 2.5  --   HGB 9.2* 10.4* 9.5* 10.0*  HCT 30.1* 32.9* 29.7* 31.5*  MCV 104.5* 101.9* 99.3 103.6*  PLT 287 286 286 247   Lab Results  Component Value Date   TSH 0.02 (A) 10/04/2022   No results found for: "HGBA1C" No results found for: "CHOL", "HDL", "LDLCALC", "LDLDIRECT", "TRIG", "CHOLHDL"  Significant Diagnostic Results in last 30 days:  US Venous Img Lower Bilateral  Result Date: 10/24/2022 CLINICAL DATA:  Short of breath, evaluate for deep venous thrombosis EXAM: Bilateral LOWER EXTREMITY VENOUS DOPPLER ULTRASOUND TECHNIQUE: Gray-scale sonography with compression, as well as color and duplex ultrasound, were performed to evaluate the deep venous system(s) from the level of the common femoral vein through the popliteal and proximal calf veins. COMPARISON:  None Available. FINDINGS: VENOUS Normal compressibility of the common femoral, superficial femoral, and popliteal veins, as well as the visualized calf veins. Visualized portions of profunda femoral vein and great saphenous vein unremarkable. No filling defects to suggest DVT on grayscale or color Doppler imaging. Doppler waveforms show normal direction of venous flow, normal respiratory plasticity and response to augmentation. Limited views of the contralateral common femoral vein are unremarkable. OTHER None. Limitations: none IMPRESSION: No evidence of deep venous thrombosis within LEFT or RIGHT lower extremity. Electronically Signed   By: Suzy Bouchard M.D.   On: 10/24/2022 18:33   DG Chest Port 1 View  Result Date: 10/24/2022 CLINICAL  DATA:  Sepsis EXAM: PORTABLE CHEST 1 VIEW COMPARISON:  10/16/2022 x-ray FINDINGS: Slightly elevated right hemidiaphragm. No consolidation, pneumothorax or effusion. Normal cardiopericardial silhouette without edema. Overlapping cardiac leads. Right shoulder arthroplasty. Film is slightly rotated. Old rib fractures. IMPRESSION: No active disease.  No acute cardiopulmonary disease. Electronically Signed   By: Jill Side M.D.   On: 10/24/2022 15:59   CT Head Wo Contrast  Result Date: 10/16/2022 CLINICAL DATA:  Syncopal episode. EXAM: CT HEAD WITHOUT CONTRAST TECHNIQUE: Contiguous axial images were obtained from the base of the skull through the vertex without intravenous contrast. RADIATION DOSE REDUCTION: This exam was performed according to the departmental dose-optimization program which includes automated exposure control, adjustment of the mA and/or kV according to patient size and/or use of iterative reconstruction technique. COMPARISON:  Head CT dated 09/28/2022. FINDINGS: Brain: Generalized age related parenchymal volume loss with commensurate dilatation of the ventricles and sulci. Mild chronic small vessel ischemic changes within the bilateral periventricular and subcortical white matter regions. No mass, hemorrhage, edema or other evidence of acute parenchymal abnormality. No extra-axial hemorrhage. Vascular: Chronic calcified atherosclerotic changes of the large vessels at the skull base. No unexpected hyperdense vessel. Skull: Normal. Negative for fracture or focal lesion. Sinuses/Orbits: No acute finding. Other: None. IMPRESSION: 1. No acute findings. No intracranial mass, hemorrhage or edema. 2. Mild chronic small vessel ischemic changes in the white matter. Electronically Signed  By: Franki Cabot M.D.   On: 10/16/2022 13:55   DG Chest 2 View  Result Date: 10/16/2022 CLINICAL DATA:  Syncope. EXAM: CHEST - 2 VIEW COMPARISON:  CT chest dated October 19, 2020. Chest x-ray dated August 24, 2020. FINDINGS: The heart size and mediastinal contours are within normal limits. Normal pulmonary vascularity. No focal consolidation, pleural effusion, or pneumothorax. No acute osseous abnormality. Old bilateral rib and sternal fractures again noted. IMPRESSION: No active cardiopulmonary disease. Electronically Signed   By: Titus Dubin M.D.   On: 10/16/2022 12:19    Assessment/Plan 1. Tachycardia - ongoing - associated with poor po intake - recent hospitalization due to hypovolemia - 01/30 IVF D5NS given for HR 120's - HR now 110's - start lopressor 12.5 mg po BID prn for HR> 110 - vital signs BID  2. Dehydration - see above - improving with supervised meals   3. Moderate protein-calorie malnutrition (HCC) - BMI 18.87, protein 6.8, albumin 2.4> was <1.5 - cont magic cups and milkshakes - cont monthly weights  4. Advanced care planning/counseling discussion - MOST form discussed with patient> would like limited interventions - social worker to discuss with HPOA, Will  - she is a DNR    Family/ staff Communication: plan discussed with patient and nurse  Labs/tests ordered:  cbc/diff, bmp 02/01

## 2022-11-01 ENCOUNTER — Non-Acute Institutional Stay (SKILLED_NURSING_FACILITY): Payer: Medicare Other | Admitting: Adult Health

## 2022-11-01 DIAGNOSIS — L570 Actinic keratosis: Secondary | ICD-10-CM | POA: Diagnosis not present

## 2022-11-01 DIAGNOSIS — M79602 Pain in left arm: Secondary | ICD-10-CM | POA: Diagnosis not present

## 2022-11-01 DIAGNOSIS — Z85828 Personal history of other malignant neoplasm of skin: Secondary | ICD-10-CM | POA: Diagnosis not present

## 2022-11-01 DIAGNOSIS — S42341S Displaced spiral fracture of shaft of humerus, right arm, sequela: Secondary | ICD-10-CM | POA: Diagnosis not present

## 2022-11-01 DIAGNOSIS — D692 Other nonthrombocytopenic purpura: Secondary | ICD-10-CM | POA: Diagnosis not present

## 2022-11-01 DIAGNOSIS — R293 Abnormal posture: Secondary | ICD-10-CM | POA: Diagnosis not present

## 2022-11-01 DIAGNOSIS — U071 COVID-19: Secondary | ICD-10-CM

## 2022-11-01 DIAGNOSIS — R2689 Other abnormalities of gait and mobility: Secondary | ICD-10-CM | POA: Diagnosis not present

## 2022-11-01 DIAGNOSIS — R2681 Unsteadiness on feet: Secondary | ICD-10-CM | POA: Diagnosis not present

## 2022-11-01 DIAGNOSIS — M6281 Muscle weakness (generalized): Secondary | ICD-10-CM | POA: Diagnosis not present

## 2022-11-01 DIAGNOSIS — Z9181 History of falling: Secondary | ICD-10-CM | POA: Diagnosis not present

## 2022-11-01 DIAGNOSIS — S80811A Abrasion, right lower leg, initial encounter: Secondary | ICD-10-CM | POA: Diagnosis not present

## 2022-11-01 MED ORDER — MOLNUPIRAVIR EUA 200MG CAPSULE: 4.0000 | ORAL_CAPSULE | Freq: Two times a day (BID) | ORAL | 0 refills | Status: AC

## 2022-11-02 DIAGNOSIS — R2689 Other abnormalities of gait and mobility: Secondary | ICD-10-CM | POA: Diagnosis not present

## 2022-11-02 DIAGNOSIS — R2681 Unsteadiness on feet: Secondary | ICD-10-CM | POA: Diagnosis not present

## 2022-11-02 DIAGNOSIS — M6281 Muscle weakness (generalized): Secondary | ICD-10-CM | POA: Diagnosis not present

## 2022-11-02 DIAGNOSIS — S42341S Displaced spiral fracture of shaft of humerus, right arm, sequela: Secondary | ICD-10-CM | POA: Diagnosis not present

## 2022-11-02 DIAGNOSIS — I10 Essential (primary) hypertension: Secondary | ICD-10-CM | POA: Diagnosis not present

## 2022-11-02 LAB — BASIC METABOLIC PANEL
BUN: 70 — AB (ref 4–21)
CO2: 17 (ref 13–22)
Chloride: 103 (ref 99–108)
Creatinine: 3.7 — AB (ref 0.5–1.1)
Glucose: 84
Potassium: 4 mEq/L (ref 3.5–5.1)
Sodium: 133 — AB (ref 137–147)

## 2022-11-02 LAB — COMPREHENSIVE METABOLIC PANEL: Calcium: 7.6 — AB (ref 8.7–10.7)

## 2022-11-03 NOTE — Progress Notes (Signed)
Location:  [place] Room Number: 16 A Place of Service:  SNF (31) Provider:  Durenda Age, DNP, FNP-BC  Patient Care Team: Virgie Dad, MD as PCP - General (Internal Medicine)  Extended Emergency Contact Information Primary Emergency Contact: Genia Plants Address: [address]          [place], [place] [address] Johnnette Litter of [place] Phone: [phone] Relation: Alanson Puls Secondary Emergency Contact: [name] Address: [address]          [place], [place] [address] Johnnette Litter of [place] Phone: [phone] Mobile Phone: [phone] Relation: Nephew  Code Status:   DNR  Goals of care: Advanced Directive information    10/26/2022   12:29 PM  Advanced Directives  Does Patient Have a Medical Advance Directive? Yes  Type of Advance Directive [place]  Does patient want to make changes to medical advance directive? No - Patient declined     Chief Complaint  Patient presents with   Acute Visit    Tested positive for COVID-19    HPI:  Pt is a 89 y.o. female seen today for an acute visit for testing positive for COVID-19. She is a long-term care resident of [facility]. She complained that she has sore throat and "tickle" in her throat. She stated that she doesn't feel well. She denies chills. No SOB. She has a total COVID-19 vaccines.   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[place])    Per [facility]   History of B-cell lymphoma 11/02/2015   History of falling    [place]   Hypothyroidism, unspecified    per [facility]   Lymphedema, not elsewhere classified    Per [facility]    Migraines    Muscle weakness (generalized)    [place]   Neuralgia and neuritis, unspecified    [place]   Nontraumatic subarachnoid hemorrhage, unspecified ([place])    per [facility]   Osteoporosis    Pain in right shoulder    Tachycardia, unspecified    [place]   Thyroid disease    Trigeminal neuralgia of left side of face    Unsteadiness on feet    [facility]   Past Surgical History:  Procedure Laterality Date   gamma knife     for trigeminal neuralgia   SHOULDER SURGERY     Right shoulder replacement   TONSILLECTOMY AND ADENOIDECTOMY     WISDOM TOOTH EXTRACTION      Allergies  Allergen Reactions   Penicillins Hives   Tegretol [Carbamazepine] Hives and Other (See Comments)    Turned purple from neck down and drowsy    Lyrica [Pregabalin] Other (See Comments)    Drowsy    Outpatient Encounter Medications as of 11/01/2022  Medication Sig   molnupiravir EUA (LAGEVRIO) 200 mg CAPS capsule Take 4 capsules (800 mg total) by mouth 2 (two) times daily for 5 days.   acetaminophen (TYLENOL) 500 MG tablet Take 500 mg by mouth every 8 (eight) hours as needed for mild pain.   acetaminophen (TYLENOL) 500 MG tablet Take 500 mg by mouth  3 (three) times daily.   Amino Acids-Protein Hydrolys (FEEDING SUPPLEMENT, PRO-STAT SUGAR FREE 64,) LIQD Take 30 mLs by mouth daily.   aspirin 81 MG chewable tablet Chew 81 mg by mouth daily.   Emollient (AQUAPHOR OINTMENT BODY EX) Apply 1 Application topically at bedtime. for dry skin   gabapentin (NEURONTIN) 600 MG tablet Take 600 mg by mouth 3 (three) times daily.   lactose free nutrition (BOOST) LIQD Take 237 mLs by mouth 2 (two) times daily between meals. Or Nutrition supplement of choice ( Chocolate)   levothyroxine (SYNTHROID) 75 MCG tablet Take 75 mcg by mouth daily before breakfast.   loperamide (IMODIUM A-D) 2 MG capsule Take 4 mg by mouth every 8 (eight) hours as needed for diarrhea or loose stools.   methocarbamol (ROBAXIN) 500 MG tablet Take 0.5 tablets (250 mg total)  by mouth 3 (three) times daily as needed for muscle spasms.   metoprolol tartrate (LOPRESSOR) 25 MG tablet Take 0.5 tablets (12.5 mg total) by mouth 2 (two) times daily as needed. Administer if HR> 110   Multiple Vitamin (MULTI-VITAMINS) TABS Take 1 tablet by mouth daily.   pantoprazole (PROTONIX) 40 MG tablet Take 40 mg by mouth 2 (two) times daily.   Pollen Extracts (PROSTAT PO) Take 30 g by mouth daily.   polyethylene glycol powder (MIRALAX) 17 GM/SCOOP powder Give 17 gram by mouth as needed for Bowel Management   polyvinyl alcohol (LIQUIFILM TEARS) 1.4 % ophthalmic solution Place 1 drop into both eyes 4 (four) times daily as needed for dry eyes.   simethicone (MYLICON) 371 MG chewable tablet Chew 125 mg by mouth daily as needed for flatulence.   traMADol (ULTRAM) 50 MG tablet Take 25 mg by mouth 3 (three) times daily as needed for moderate pain or severe pain.   vitamin B-12 (CYANOCOBALAMIN) 1000 MCG tablet Take 1 tablet (1,000 mcg total) by mouth daily.   zinc oxide 20 % ointment Apply 1 application. topically as needed for irritation. To buttocks after every incontinent episode and as needed for redness. May keep at bedside.   Facility-Administered Encounter Medications as of 11/01/2022  Medication   epoetin alfa-epbx (RETACRIT) injection 40,000 Units    Review of Systems  Constitutional:  Positive for appetite change. Negative for chills, fatigue and fever.  HENT:  Positive for postnasal drip and sore throat. Negative for congestion, hearing loss and rhinorrhea.   Eyes: Negative.   Respiratory:  Negative for cough, shortness of breath and wheezing.   Cardiovascular:  Negative for chest pain, palpitations and leg swelling.  Gastrointestinal:  Negative for abdominal pain, constipation, diarrhea, nausea and vomiting.  Genitourinary:  Negative for dysuria.  Musculoskeletal:  Negative for arthralgias, back pain and myalgias.  Skin:  Negative for color change, rash and wound.  Neurological:   Negative for dizziness, weakness and headaches.  Psychiatric/Behavioral:  Negative for behavioral problems. The patient is not nervous/anxious.       Immunization History  Administered Date(s) Administered   Fluad Quad(high Dose 65+) 05/28/2019, 07/25/2022   Influenza Split 07/12/2016, 08/13/2017, 06/02/2019, 09/09/2020   Influenza, High Dose Seasonal PF 06/11/2017, 06/28/2018   Influenza-Unspecified 06/21/2020, 07/25/2021   Moderna SARS-COV2 Booster Vaccination 03/08/2021   Moderna Sars-Covid-2 Vaccination 10/05/2019, 11/02/2019, 08/15/2020   PFIZER(Purple Top)SARS-COV-2 Vaccination 06/21/2021   Pfizer Covid-19 Vaccine Bivalent Booster 35yr & up 08/07/2022   Pneumococcal Conjugate-13 11/22/2014   Pneumococcal Polysaccharide-23 04/29/2009, 08/13/2017   Td 01/24/2013   Td (Adult) 01/24/2013   Tdap 12/28/2017, 07/01/2018, 12/13/2020   Zoster  Recombinat (Shingrix) 07/01/2018   Zoster, Live 11/12/2009, 07/01/2018   Zoster, Unspecified 12/17/2005   Pertinent  Health Maintenance Due  Topic Date Due   INFLUENZA VACCINE  Completed   DEXA SCAN  Completed      09/21/2022    2:18 PM 09/28/2022    4:34 PM 10/17/2022   10:45 AM 10/22/2022    1:43 PM 10/23/2022   10:27 AM  [place] in the past year?   0 0 1  Was there an injury with Fall?   0 0 0  Fall Risk Category Calculator   0 0 2  (RETIRED) Patient Fall Risk Level High fall risk High fall risk     Patient at Risk for Falls Due to   History of fall(s);Impaired balance/gait History of fall(s);Impaired balance/gait History of fall(s);Impaired balance/gait  Fall risk Follow up   Falls evaluation completed Falls evaluation completed Falls evaluation completed     Vitals:   11/01/22 1700  BP: 132/74  Pulse: (!) 101  Resp: 18  Temp: 97.9 F (36.6 C)  Weight: 116 lb 14.4 oz (53 kg)   Body mass index is 18.87 kg/m.  Physical Exam Constitutional:      Appearance: Normal appearance.  HENT:     Head: Normocephalic and  atraumatic.     Nose: Nose normal.     Mouth/Throat:     Mouth: Mucous membranes are moist.  Eyes:     Conjunctiva/sclera: Conjunctivae normal.  Cardiovascular:     Rate and Rhythm: Normal rate and regular rhythm.  Pulmonary:     Effort: Pulmonary effort is normal.     Breath sounds: Normal breath sounds.  Abdominal:     General: Bowel sounds are normal.     Palpations: Abdomen is soft.  Musculoskeletal:        General: Normal range of motion.     Cervical back: Normal range of motion.  Skin:    General: Skin is warm and dry.  Neurological:     Mental Status: She is alert and oriented to person, place, and time. Mental status is at baseline.  Psychiatric:        Mood and Affect: Mood normal.        Behavior: Behavior normal.        Thought Content: Thought content normal.        Judgment: Judgment normal.        Labs reviewed: Recent Labs    10/24/22 1602 10/25/22 0800 10/26/22 0701  NA 136 136 135  K 4.1 3.8 4.4  CL 108 108 106  CO2 18* 18* 20*  GLUCOSE 118* 112* 95  BUN 71* 54* 50*  CREATININE 2.99* 2.64* 2.52*  CALCIUM 7.9* 7.7* 7.9*   Recent Labs    10/24/22 1409 10/24/22 1602 10/25/22 0800  AST 12* 17 18  ALT '6 9 10  '$ ALKPHOS 114 109 93  BILITOT 0.2* 0.4 0.5  PROT 6.8 6.6 6.3*  ALBUMIN 2.4* 1.6* <1.5*   Recent Labs    10/16/22 1200 10/24/22 1409 10/24/22 1602 10/25/22 0800  WBC 8.0 9.1 8.5 7.6  NEUTROABS 4.3 2.4 2.5  --   HGB 9.2* 10.4* 9.5* 10.0*  HCT 30.1* 32.9* 29.7* 31.5*  MCV 104.5* 101.9* 99.3 103.6*  PLT 287 286 286 247   Lab Results  Component Value Date   TSH 0.02 (A) 10/04/2022   No results found for: "HGBA1C" No results found for: "CHOL", "HDL", "LDLCALC", "LDLDIRECT", "TRIG", "CHOLHDL"  Significant Diagnostic  Results in last 30 days:  US Venous Img Lower Bilateral  Result Date: 10/24/2022 CLINICAL DATA:  Short of breath, evaluate for deep venous thrombosis EXAM: Bilateral LOWER EXTREMITY VENOUS DOPPLER ULTRASOUND  TECHNIQUE: Gray-scale sonography with compression, as well as color and duplex ultrasound, were performed to evaluate the deep venous system(s) from the level of the common femoral vein through the popliteal and proximal calf veins. COMPARISON:  None Available. FINDINGS: VENOUS Normal compressibility of the common femoral, superficial femoral, and popliteal veins, as well as the visualized calf veins. Visualized portions of profunda femoral vein and great saphenous vein unremarkable. No filling defects to suggest DVT on grayscale or color Doppler imaging. Doppler waveforms show normal direction of venous flow, normal respiratory plasticity and response to augmentation. Limited views of the contralateral common femoral vein are unremarkable. OTHER None. Limitations: none IMPRESSION: No evidence of deep venous thrombosis within LEFT or RIGHT lower extremity. Electronically Signed   By: Suzy Bouchard M.D.   On: 10/24/2022 18:33   DG Chest Port 1 View  Result Date: 10/24/2022 CLINICAL DATA:  Sepsis EXAM: PORTABLE CHEST 1 VIEW COMPARISON:  10/16/2022 x-ray FINDINGS: Slightly elevated right hemidiaphragm. No consolidation, pneumothorax or effusion. Normal cardiopericardial silhouette without edema. Overlapping cardiac leads. Right shoulder arthroplasty. Film is slightly rotated. Old rib fractures. IMPRESSION: No active disease.  No acute cardiopulmonary disease. Electronically Signed   By: Jill Side M.D.   On: 10/24/2022 15:59   CT Head Wo Contrast  Result Date: 10/16/2022 CLINICAL DATA:  Syncopal episode. EXAM: CT HEAD WITHOUT CONTRAST TECHNIQUE: Contiguous axial images were obtained from the base of the skull through the vertex without intravenous contrast. RADIATION DOSE REDUCTION: This exam was performed according to the departmental dose-optimization program which includes automated exposure control, adjustment of the mA and/or kV according to patient size and/or use of iterative reconstruction  technique. COMPARISON:  Head CT dated 09/28/2022. FINDINGS: Brain: Generalized age related parenchymal volume loss with commensurate dilatation of the ventricles and sulci. Mild chronic small vessel ischemic changes within the bilateral periventricular and subcortical white matter regions. No mass, hemorrhage, edema or other evidence of acute parenchymal abnormality. No extra-axial hemorrhage. Vascular: Chronic calcified atherosclerotic changes of the large vessels at the skull base. No unexpected hyperdense vessel. Skull: Normal. Negative for fracture or focal lesion. Sinuses/Orbits: No acute finding. Other: None. IMPRESSION: 1. No acute findings. No intracranial mass, hemorrhage or edema. 2. Mild chronic small vessel ischemic changes in the white matter. Electronically Signed   By: Franki Cabot M.D.   On: 10/16/2022 13:55   DG Chest 2 View  Result Date: 10/16/2022 CLINICAL DATA:  Syncope. EXAM: CHEST - 2 VIEW COMPARISON:  CT chest dated October 19, 2020. Chest x-ray dated August 24, 2020. FINDINGS: The heart size and mediastinal contours are within normal limits. Normal pulmonary vascularity. No focal consolidation, pleural effusion, or pneumothorax. No acute osseous abnormality. Old bilateral rib and sternal fractures again noted. IMPRESSION: No active cardiopulmonary disease. Electronically Signed   By: Titus Dubin M.D.   On: 10/16/2022 12:19    Assessment/Plan  1. COVID-19 virus infection -  will start on Monupiravir -  will quarantine for 10 days -  continue supportive care - molnupiravir EUA (LAGEVRIO) 200 mg CAPS capsule; Take 4 capsules (800 mg total) by mouth 2 (two) times daily for 5 days.  Dispense: 40 capsule; Refill: 0   Family/ staff Communication: Discussed plan of care with resident and charge nurse.  Labs/tests ordered: COVID-19 test  Durenda Age, DNP, MSN, FNP-BC [facility] [phone] (Monday-Friday 8:00 a.m. - 5:00  p.m.) [phone] (after hours)

## 2022-11-05 DIAGNOSIS — R2681 Unsteadiness on feet: Secondary | ICD-10-CM | POA: Diagnosis not present

## 2022-11-05 DIAGNOSIS — S42341S Displaced spiral fracture of shaft of humerus, right arm, sequela: Secondary | ICD-10-CM | POA: Diagnosis not present

## 2022-11-05 DIAGNOSIS — M6281 Muscle weakness (generalized): Secondary | ICD-10-CM | POA: Diagnosis not present

## 2022-11-05 DIAGNOSIS — R2689 Other abnormalities of gait and mobility: Secondary | ICD-10-CM | POA: Diagnosis not present

## 2022-11-05 DIAGNOSIS — M79602 Pain in left arm: Secondary | ICD-10-CM | POA: Diagnosis not present

## 2022-11-06 DIAGNOSIS — S42341S Displaced spiral fracture of shaft of humerus, right arm, sequela: Secondary | ICD-10-CM | POA: Diagnosis not present

## 2022-11-06 DIAGNOSIS — M79602 Pain in left arm: Secondary | ICD-10-CM | POA: Diagnosis not present

## 2022-11-06 DIAGNOSIS — R2681 Unsteadiness on feet: Secondary | ICD-10-CM | POA: Diagnosis not present

## 2022-11-06 DIAGNOSIS — R2689 Other abnormalities of gait and mobility: Secondary | ICD-10-CM | POA: Diagnosis not present

## 2022-11-06 DIAGNOSIS — M6281 Muscle weakness (generalized): Secondary | ICD-10-CM | POA: Diagnosis not present

## 2022-11-07 DIAGNOSIS — S42341S Displaced spiral fracture of shaft of humerus, right arm, sequela: Secondary | ICD-10-CM | POA: Diagnosis not present

## 2022-11-07 DIAGNOSIS — M79602 Pain in left arm: Secondary | ICD-10-CM | POA: Diagnosis not present

## 2022-11-07 DIAGNOSIS — M6281 Muscle weakness (generalized): Secondary | ICD-10-CM | POA: Diagnosis not present

## 2022-11-07 DIAGNOSIS — R2681 Unsteadiness on feet: Secondary | ICD-10-CM | POA: Diagnosis not present

## 2022-11-07 DIAGNOSIS — R2689 Other abnormalities of gait and mobility: Secondary | ICD-10-CM | POA: Diagnosis not present

## 2022-11-08 DIAGNOSIS — Z9181 History of falling: Secondary | ICD-10-CM | POA: Diagnosis not present

## 2022-11-08 DIAGNOSIS — S42341S Displaced spiral fracture of shaft of humerus, right arm, sequela: Secondary | ICD-10-CM | POA: Diagnosis not present

## 2022-11-08 DIAGNOSIS — R2689 Other abnormalities of gait and mobility: Secondary | ICD-10-CM | POA: Diagnosis not present

## 2022-11-08 DIAGNOSIS — R293 Abnormal posture: Secondary | ICD-10-CM | POA: Diagnosis not present

## 2022-11-08 DIAGNOSIS — R2681 Unsteadiness on feet: Secondary | ICD-10-CM | POA: Diagnosis not present

## 2022-11-08 DIAGNOSIS — M79602 Pain in left arm: Secondary | ICD-10-CM | POA: Diagnosis not present

## 2022-11-08 DIAGNOSIS — M6281 Muscle weakness (generalized): Secondary | ICD-10-CM | POA: Diagnosis not present

## 2022-11-12 DIAGNOSIS — Z9181 History of falling: Secondary | ICD-10-CM | POA: Diagnosis not present

## 2022-11-12 DIAGNOSIS — M6281 Muscle weakness (generalized): Secondary | ICD-10-CM | POA: Diagnosis not present

## 2022-11-12 DIAGNOSIS — S42341S Displaced spiral fracture of shaft of humerus, right arm, sequela: Secondary | ICD-10-CM | POA: Diagnosis not present

## 2022-11-12 DIAGNOSIS — R293 Abnormal posture: Secondary | ICD-10-CM | POA: Diagnosis not present

## 2022-11-12 DIAGNOSIS — M79602 Pain in left arm: Secondary | ICD-10-CM | POA: Diagnosis not present

## 2022-11-12 DIAGNOSIS — R2681 Unsteadiness on feet: Secondary | ICD-10-CM | POA: Diagnosis not present

## 2022-11-12 DIAGNOSIS — R2689 Other abnormalities of gait and mobility: Secondary | ICD-10-CM | POA: Diagnosis not present

## 2022-11-13 DIAGNOSIS — S42341S Displaced spiral fracture of shaft of humerus, right arm, sequela: Secondary | ICD-10-CM | POA: Diagnosis not present

## 2022-11-13 DIAGNOSIS — Z9181 History of falling: Secondary | ICD-10-CM | POA: Diagnosis not present

## 2022-11-13 DIAGNOSIS — R293 Abnormal posture: Secondary | ICD-10-CM | POA: Diagnosis not present

## 2022-11-13 DIAGNOSIS — M79602 Pain in left arm: Secondary | ICD-10-CM | POA: Diagnosis not present

## 2022-11-13 DIAGNOSIS — R2681 Unsteadiness on feet: Secondary | ICD-10-CM | POA: Diagnosis not present

## 2022-11-13 DIAGNOSIS — M6281 Muscle weakness (generalized): Secondary | ICD-10-CM | POA: Diagnosis not present

## 2022-11-13 DIAGNOSIS — R2689 Other abnormalities of gait and mobility: Secondary | ICD-10-CM | POA: Diagnosis not present

## 2022-11-14 ENCOUNTER — Inpatient Hospital Stay: Payer: Medicare Other | Admitting: Hematology & Oncology

## 2022-11-14 ENCOUNTER — Inpatient Hospital Stay: Payer: Medicare Other

## 2022-11-14 DIAGNOSIS — M79602 Pain in left arm: Secondary | ICD-10-CM | POA: Diagnosis not present

## 2022-11-14 DIAGNOSIS — R2681 Unsteadiness on feet: Secondary | ICD-10-CM | POA: Diagnosis not present

## 2022-11-14 DIAGNOSIS — R2689 Other abnormalities of gait and mobility: Secondary | ICD-10-CM | POA: Diagnosis not present

## 2022-11-14 DIAGNOSIS — S42341S Displaced spiral fracture of shaft of humerus, right arm, sequela: Secondary | ICD-10-CM | POA: Diagnosis not present

## 2022-11-14 DIAGNOSIS — M6281 Muscle weakness (generalized): Secondary | ICD-10-CM | POA: Diagnosis not present

## 2022-11-15 ENCOUNTER — Non-Acute Institutional Stay (SKILLED_NURSING_FACILITY): Payer: Medicare Other | Admitting: Internal Medicine

## 2022-11-15 ENCOUNTER — Encounter: Payer: Self-pay | Admitting: Internal Medicine

## 2022-11-15 DIAGNOSIS — Z8572 Personal history of non-Hodgkin lymphomas: Secondary | ICD-10-CM

## 2022-11-15 DIAGNOSIS — M79602 Pain in left arm: Secondary | ICD-10-CM | POA: Diagnosis not present

## 2022-11-15 DIAGNOSIS — R638 Other symptoms and signs concerning food and fluid intake: Secondary | ICD-10-CM | POA: Diagnosis not present

## 2022-11-15 DIAGNOSIS — M6281 Muscle weakness (generalized): Secondary | ICD-10-CM | POA: Diagnosis not present

## 2022-11-15 DIAGNOSIS — Z7189 Other specified counseling: Secondary | ICD-10-CM

## 2022-11-15 DIAGNOSIS — N184 Chronic kidney disease, stage 4 (severe): Secondary | ICD-10-CM

## 2022-11-15 DIAGNOSIS — R634 Abnormal weight loss: Secondary | ICD-10-CM

## 2022-11-15 DIAGNOSIS — E43 Unspecified severe protein-calorie malnutrition: Secondary | ICD-10-CM | POA: Diagnosis not present

## 2022-11-15 DIAGNOSIS — R2689 Other abnormalities of gait and mobility: Secondary | ICD-10-CM | POA: Diagnosis not present

## 2022-11-15 DIAGNOSIS — S42341S Displaced spiral fracture of shaft of humerus, right arm, sequela: Secondary | ICD-10-CM | POA: Diagnosis not present

## 2022-11-15 DIAGNOSIS — R2681 Unsteadiness on feet: Secondary | ICD-10-CM | POA: Diagnosis not present

## 2022-11-15 NOTE — Progress Notes (Signed)
Location:  [place] Room Number: [place] of Service:  SNF [phone]) Provider:  Virgie Dad, MD   Virgie Dad, MD  Patient Care Team: Virgie Dad, MD as PCP - General (Internal Medicine)  Extended Emergency Contact Information Primary Emergency Contact: Genia Plants Address: [address]          [place], [place] [address] Johnnette Litter of [place] Phone: [phone] Relation: Alanson Puls Secondary Emergency Contact: [name] Address: [address]          [place], [place] [address] Johnnette Litter of [place] Phone: [phone] Mobile Phone: [phone] Relation: Nephew  Code Status:  dnr Goals of care: Advanced Directive information    10/26/2022   12:29 PM  Advanced Directives  Does Patient Have a Medical Advance Directive? Yes  Type of Advance Directive [place]  Does patient want to make changes to medical advance directive? No - Patient declined     Chief Complaint  Patient presents with   Acute Visit    Weight loss   Immunizations    Discussed the need for shingles vaccine     HPI:  Pt is a 89 y.o. 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  osteoporosis,   hypothyroidism,  trigeminal neuralgia and migraines  Recently Acute Renal Failure Dehydration Poor appetite  Mechanical fall in 09/28/22 with Right Humeral Fracture  Recurrent Syncope's and Tachycardia Recent Covid infection  Patient not eating Has lost 10 lbs Very weak Tachycardic Therapy tried to get her up but she was too weak so they put her back in the bed She says she is not in any distress but does not feel good No Nausea or vomiting No SOB no Fever  Wt Readings from Last 3 Encounters:  11/15/22 107 lb 3.2 oz (48.6 kg)  11/01/22 116 lb 14.4 oz (53 kg)  10/31/22 116 lb 14.4 oz (53  kg)      Past Medical History:  Diagnosis Date   Abnormal posture    [place]   Cancer ([place])    non hodgkin lymphoma   Chronic lymphocytic leukemia ([place])    Cognitive communication deficit    [place]   Dorsalgia, unspecified    per [facility]   Dry eye syndrome of unspecified lacrimal gland    per [facility]   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[place])    Per [facility]   History of B-cell lymphoma 11/02/2015   History of falling    [place]   Hypothyroidism, unspecified    per [facility]   Lymphedema, not elsewhere classified    Per [facility]   Migraines    Muscle weakness (generalized)    [place]   Neuralgia and neuritis, unspecified    [place]   Nontraumatic subarachnoid hemorrhage, unspecified ([place])    per [facility]   Osteoporosis    Pain in right shoulder    Tachycardia, unspecified    [place]   Thyroid disease    Trigeminal neuralgia of left side of face    Unsteadiness on feet    [facility]   Past Surgical History:  Procedure Laterality Date   gamma knife     for trigeminal neuralgia   SHOULDER SURGERY     Right shoulder replacement   TONSILLECTOMY AND ADENOIDECTOMY     WISDOM  TOOTH EXTRACTION      Allergies  Allergen Reactions   Penicillins Hives   Tegretol [Carbamazepine] Hives and Other (See Comments)    Turned purple from neck down and drowsy    Lyrica [Pregabalin] Other (See Comments)    Drowsy    Outpatient Encounter Medications as of 11/15/2022  Medication Sig   acetaminophen (TYLENOL) 500 MG tablet Take 500 mg by mouth every 8 (eight) hours as needed for mild pain.   acetaminophen (TYLENOL) 500 MG tablet Take 500 mg by mouth 3 (three) times daily.   Amino Acids-Protein Hydrolys (FEEDING SUPPLEMENT, PRO-STAT SUGAR FREE 64,) LIQD Take 30 mLs by mouth daily.   aspirin 81 MG chewable tablet Chew 81 mg by mouth daily.   Emollient (AQUAPHOR OINTMENT BODY EX) Apply 1 Application topically at bedtime. for dry skin   gabapentin (NEURONTIN) 600 MG tablet Take  600 mg by mouth 3 (three) times daily.   lactose free nutrition (BOOST) LIQD Take 237 mLs by mouth 2 (two) times daily between meals. Or Nutrition supplement of choice ( Chocolate)   levothyroxine (SYNTHROID) 75 MCG tablet Take 75 mcg by mouth daily before breakfast.   loperamide (IMODIUM A-D) 2 MG capsule Take 4 mg by mouth every 8 (eight) hours as needed for diarrhea or loose stools.   methocarbamol (ROBAXIN) 500 MG tablet Take 0.5 tablets (250 mg total) by mouth 3 (three) times daily as needed for muscle spasms.   metoprolol tartrate (LOPRESSOR) 25 MG tablet Take 0.5 tablets (12.5 mg total) by mouth 2 (two) times daily as needed. Administer if HR> 110   Multiple Vitamin (MULTI-VITAMINS) TABS Take 1 tablet by mouth daily.   pantoprazole (PROTONIX) 40 MG tablet Take 40 mg by mouth 2 (two) times daily.   Pollen Extracts (PROSTAT PO) Take 30 g by mouth daily.   polyethylene glycol powder (MIRALAX) 17 GM/SCOOP powder Give 17 gram by mouth as needed for Bowel Management   polyvinyl alcohol (LIQUIFILM TEARS) 1.4 % ophthalmic solution Place 1 drop into both eyes 4 (four) times daily as needed for dry eyes.   simethicone (MYLICON) 0000000 MG chewable tablet Chew 125 mg by mouth daily as needed for flatulence.   traMADol (ULTRAM) 50 MG tablet Take 25 mg by mouth 3 (three) times daily as needed for moderate pain or severe pain.   vitamin B-12 (CYANOCOBALAMIN) 1000 MCG tablet Take 1 tablet (1,000 mcg total) by mouth daily.   zinc oxide 20 % ointment Apply 1 application. topically as needed for irritation. To buttocks after every incontinent episode and as needed for redness. May keep at bedside.   Facility-Administered Encounter Medications as of 11/15/2022  Medication   epoetin alfa-epbx (RETACRIT) injection 40,000 Units    Review of Systems  Constitutional:  Positive for activity change, appetite change and unexpected weight change.  HENT: Negative.    Respiratory:  Negative for cough and shortness of  breath.   Cardiovascular:  Negative for leg swelling.  Gastrointestinal:  Negative for constipation.  Genitourinary: Negative.   Musculoskeletal:  Positive for gait problem. Negative for arthralgias and myalgias.  Skin: Negative.   Neurological:  Positive for weakness. Negative for dizziness.  Psychiatric/Behavioral:  Positive for confusion. Negative for dysphoric mood and sleep disturbance.     Immunization History  Administered Date(s) Administered   Fluad Quad(high Dose 65+) 05/28/2019, 07/25/2022   Influenza Split 07/12/2016, 08/13/2017, 06/02/2019, 09/09/2020   Influenza, High Dose Seasonal PF 06/11/2017, 06/28/2018   Influenza-Unspecified 06/21/2020, 07/25/2021   Moderna SARS-COV2 Booster  Vaccination 03/08/2021   Moderna Sars-Covid-2 Vaccination 10/05/2019, 11/02/2019, 08/15/2020   PFIZER(Purple Top)SARS-COV-2 Vaccination 06/21/2021   Pfizer Covid-19 Vaccine Bivalent Booster 71yr & up 08/07/2022   Pneumococcal Conjugate-13 11/22/2014   Pneumococcal Polysaccharide-23 04/29/2009, 08/13/2017   Td 01/24/2013   Td (Adult) 01/24/2013   Tdap 12/28/2017, 07/01/2018, 12/13/2020   Zoster Recombinat (Shingrix) 07/01/2018   Zoster, Live 11/12/2009, 07/01/2018   Zoster, Unspecified 12/17/2005   Pertinent  Health Maintenance Due  Topic Date Due   INFLUENZA VACCINE  Completed   DEXA SCAN  Completed      09/21/2022    2:18 PM 09/28/2022    4:34 PM 10/17/2022   10:45 AM 10/22/2022    1:43 PM 10/23/2022   10:27 AM  Fall Risk  Falls in the past year?   0 0 1  Was there an injury with Fall?   0 0 0  Fall Risk Category Calculator   0 0 2  (RETIRED) Patient Fall Risk Level High fall risk High fall risk     Patient at Risk for Falls Due to   History of fall(s);Impaired balance/gait History of fall(s);Impaired balance/gait History of fall(s);Impaired balance/gait  Fall risk Follow up   Falls evaluation completed Falls evaluation completed Falls evaluation completed   Functional Status  Survey:    Vitals:   11/15/22 1537  BP: 126/63  Pulse: (!) 116  Resp: 18  Temp: (!) 97.1 F (36.2 C)  TempSrc: Temporal  SpO2: 94%  Weight: 107 lb 3.2 oz (48.6 kg)  Height: 5' 6"$  (1.676 m)   Body mass index is 17.3 kg/m. Physical Exam Vitals reviewed.  Constitutional:      Comments: Drowzy  HENT:     Head: Normocephalic.     Nose: Nose normal.     Mouth/Throat:     Mouth: Mucous membranes are moist.     Pharynx: Oropharynx is clear.  Eyes:     Pupils: Pupils are equal, round, and reactive to light.  Cardiovascular:     Rate and Rhythm: Regular rhythm. Tachycardia present.     Pulses: Normal pulses.     Heart sounds: Normal heart sounds. No murmur heard. Pulmonary:     Effort: Pulmonary effort is normal.     Breath sounds: Normal breath sounds.  Abdominal:     General: Abdomen is flat. Bowel sounds are normal.     Palpations: Abdomen is soft.  Musculoskeletal:        General: No swelling.     Cervical back: Neck supple.  Skin:    General: Skin is warm.  Neurological:     General: No focal deficit present.     Mental Status: She is alert and oriented to person, place, and time.  Psychiatric:        Mood and Affect: Mood normal.        Thought Content: Thought content normal.     Labs reviewed: Recent Labs    10/24/22 1602 10/25/22 0800 10/26/22 0701  NA 136 136 135  K 4.1 3.8 4.4  CL 108 108 106  CO2 18* 18* 20*  GLUCOSE 118* 112* 95  BUN 71* 54* 50*  CREATININE 2.99* 2.64* 2.52*  CALCIUM 7.9* 7.7* 7.9*   Recent Labs    10/24/22 1409 10/24/22 1602 10/25/22 0800  AST 12* 17 18  ALT 6 9 10  $ ALKPHOS 114 109 93  BILITOT 0.2* 0.4 0.5  PROT 6.8 6.6 6.3*  ALBUMIN 2.4* 1.6* <1.5*   Recent Labs  10/16/22 1200 10/24/22 1409 10/24/22 1602 10/25/22 0800  WBC 8.0 9.1 8.5 7.6  NEUTROABS 4.3 2.4 2.5  --   HGB 9.2* 10.4* 9.5* 10.0*  HCT 30.1* 32.9* 29.7* 31.5*  MCV 104.5* 101.9* 99.3 103.6*  PLT 287 286 286 247   Lab Results  Component  Value Date   TSH 0.02 (A) 10/04/2022   No results found for: "HGBA1C" No results found for: "CHOL", "HDL", "LDLCALC", "LDLDIRECT", "TRIG", "CHOLHDL"  Significant Diagnostic Results in last 30 days:  US Venous Img Lower Bilateral  Result Date: 10/24/2022 CLINICAL DATA:  Short of breath, evaluate for deep venous thrombosis EXAM: Bilateral LOWER EXTREMITY VENOUS DOPPLER ULTRASOUND TECHNIQUE: Gray-scale sonography with compression, as well as color and duplex ultrasound, were performed to evaluate the deep venous system(s) from the level of the common femoral vein through the popliteal and proximal calf veins. COMPARISON:  None Available. FINDINGS: VENOUS Normal compressibility of the common femoral, superficial femoral, and popliteal veins, as well as the visualized calf veins. Visualized portions of profunda femoral vein and great saphenous vein unremarkable. No filling defects to suggest DVT on grayscale or color Doppler imaging. Doppler waveforms show normal direction of venous flow, normal respiratory plasticity and response to augmentation. Limited views of the contralateral common femoral vein are unremarkable. OTHER None. Limitations: none IMPRESSION: No evidence of deep venous thrombosis within LEFT or RIGHT lower extremity. Electronically Signed   By: Suzy Bouchard M.D.   On: 10/24/2022 18:33   DG Chest Port 1 View  Result Date: 10/24/2022 CLINICAL DATA:  Sepsis EXAM: PORTABLE CHEST 1 VIEW COMPARISON:  10/16/2022 x-ray FINDINGS: Slightly elevated right hemidiaphragm. No consolidation, pneumothorax or effusion. Normal cardiopericardial silhouette without edema. Overlapping cardiac leads. Right shoulder arthroplasty. Film is slightly rotated. Old rib fractures. IMPRESSION: No active disease.  No acute cardiopulmonary disease. Electronically Signed   By: Jill Side M.D.   On: 10/24/2022 15:59    Assessment/Plan 1. Weight loss Significant weight loss with Poor Appetite Discussed with the POA  her nephew We discussed that giving her IV Fluids is not going to help her with weight loss and her Poor appetite She is not candidate for any aggressive work up He has agreed Will repeat Labs tomorrow  If they continue to get worse will make Hospice referral  2. Stage 4 chronic kidney disease (HCC) Worsening Repeating labs Hospice referral if labs show worsening  3. Poor fluid intake   4. Severe protein-calorie malnutrition (HCC) Albumin less then 1.5  5. History of B-cell lymphoma Off Chemo due to Acute renal Insufficiency  6. Advanced care planning/counseling discussion Discussed with the POA who is her Nephew that patient is progressively getting weaker Losing weight and possible has Renal Failure With her age and frailty Hospice would be appropriate He agrees with the plan  Will call him again tomorrow with results before making hospice consult    Family/ staff Communication:   Labs/tests ordered:  CBC,CMP tomorrow

## 2022-11-16 DIAGNOSIS — R2689 Other abnormalities of gait and mobility: Secondary | ICD-10-CM | POA: Diagnosis not present

## 2022-11-16 DIAGNOSIS — E861 Hypovolemia: Secondary | ICD-10-CM | POA: Diagnosis not present

## 2022-11-16 DIAGNOSIS — R2681 Unsteadiness on feet: Secondary | ICD-10-CM | POA: Diagnosis not present

## 2022-11-16 DIAGNOSIS — M6281 Muscle weakness (generalized): Secondary | ICD-10-CM | POA: Diagnosis not present

## 2022-11-16 DIAGNOSIS — R627 Adult failure to thrive: Secondary | ICD-10-CM | POA: Diagnosis not present

## 2022-11-16 DIAGNOSIS — S42341S Displaced spiral fracture of shaft of humerus, right arm, sequela: Secondary | ICD-10-CM | POA: Diagnosis not present

## 2022-11-26 ENCOUNTER — Telehealth: Payer: Self-pay

## 2022-11-26 NOTE — Telephone Encounter (Signed)
Per obituary in [facility], pt passed away on 12/02/2022. Dr Marin Olp aware. Flowers ordered for pt's nephew, Will. dph

## 2022-11-30 DEATH — deceased
